# Patient Record
Sex: Female | Born: 1935
Health system: Southern US, Community
[De-identification: ages and names within clinical notes are randomized; demographics above are authoritative.]

## PROBLEM LIST (undated history)

## (undated) DIAGNOSIS — E785 Hyperlipidemia, unspecified: Secondary | ICD-10-CM

## (undated) DIAGNOSIS — K5790 Diverticulosis of intestine, part unspecified, without perforation or abscess without bleeding: Secondary | ICD-10-CM

## (undated) DIAGNOSIS — Z5189 Encounter for other specified aftercare: Secondary | ICD-10-CM

## (undated) DIAGNOSIS — R519 Headache, unspecified: Secondary | ICD-10-CM

## (undated) DIAGNOSIS — K222 Esophageal obstruction: Secondary | ICD-10-CM

## (undated) DIAGNOSIS — K3184 Gastroparesis: Secondary | ICD-10-CM

## (undated) DIAGNOSIS — IMO0001 Reserved for inherently not codable concepts without codable children: Secondary | ICD-10-CM

## (undated) DIAGNOSIS — D509 Iron deficiency anemia, unspecified: Secondary | ICD-10-CM

## (undated) DIAGNOSIS — Z9889 Other specified postprocedural states: Secondary | ICD-10-CM

## (undated) DIAGNOSIS — I10 Essential (primary) hypertension: Secondary | ICD-10-CM

## (undated) DIAGNOSIS — D126 Benign neoplasm of colon, unspecified: Secondary | ICD-10-CM

## (undated) DIAGNOSIS — I739 Peripheral vascular disease, unspecified: Secondary | ICD-10-CM

## (undated) DIAGNOSIS — K648 Other hemorrhoids: Secondary | ICD-10-CM

## (undated) DIAGNOSIS — R112 Nausea with vomiting, unspecified: Secondary | ICD-10-CM

## (undated) DIAGNOSIS — M1990 Unspecified osteoarthritis, unspecified site: Secondary | ICD-10-CM

## (undated) DIAGNOSIS — I639 Cerebral infarction, unspecified: Secondary | ICD-10-CM

## (undated) DIAGNOSIS — K219 Gastro-esophageal reflux disease without esophagitis: Secondary | ICD-10-CM

## (undated) DIAGNOSIS — E039 Hypothyroidism, unspecified: Secondary | ICD-10-CM

## (undated) DIAGNOSIS — G459 Transient cerebral ischemic attack, unspecified: Secondary | ICD-10-CM

## (undated) HISTORY — DX: Gastroparesis: K31.84

## (undated) HISTORY — DX: Essential (primary) hypertension: I10

## (undated) HISTORY — DX: Gastro-esophageal reflux disease without esophagitis: K21.9

## (undated) HISTORY — DX: Other hemorrhoids: K64.8

## (undated) HISTORY — DX: Iron deficiency anemia, unspecified: D50.9

## (undated) HISTORY — DX: Reserved for inherently not codable concepts without codable children: IMO0001

## (undated) HISTORY — DX: Peripheral vascular disease, unspecified: I73.9

## (undated) HISTORY — PX: CERVICAL FUSION: SHX112

## (undated) HISTORY — DX: Hyperlipidemia, unspecified: E78.5

## (undated) HISTORY — PX: APPENDECTOMY: SHX54

## (undated) HISTORY — DX: Esophageal obstruction: K22.2

## (undated) HISTORY — PX: OTHER SURGICAL HISTORY: SHX169

## (undated) HISTORY — PX: ABDOMINAL HYSTERECTOMY: SHX81

## (undated) HISTORY — DX: Unspecified osteoarthritis, unspecified site: M19.90

## (undated) HISTORY — DX: Benign neoplasm of colon, unspecified: D12.6

## (undated) HISTORY — DX: Headache, unspecified: R51.9

## (undated) HISTORY — DX: Encounter for other specified aftercare: Z51.89

## (undated) HISTORY — DX: Diverticulosis of intestine, part unspecified, without perforation or abscess without bleeding: K57.90

## (undated) HISTORY — DX: Hypothyroidism, unspecified: E03.9

---

## 1997-12-06 ENCOUNTER — Emergency Department (HOSPITAL_COMMUNITY): Admission: EM | Admit: 1997-12-06 | Discharge: 1997-12-06 | Payer: Self-pay | Admitting: Emergency Medicine

## 1999-04-21 ENCOUNTER — Ambulatory Visit (HOSPITAL_COMMUNITY): Admission: RE | Admit: 1999-04-21 | Discharge: 1999-04-21 | Payer: Self-pay | Admitting: Orthopaedic Surgery

## 2000-11-23 ENCOUNTER — Other Ambulatory Visit: Admission: RE | Admit: 2000-11-23 | Discharge: 2000-11-23 | Payer: Self-pay | Admitting: Obstetrics and Gynecology

## 2001-12-29 ENCOUNTER — Other Ambulatory Visit: Admission: RE | Admit: 2001-12-29 | Discharge: 2001-12-29 | Payer: Self-pay | Admitting: Obstetrics and Gynecology

## 2003-02-09 DIAGNOSIS — D126 Benign neoplasm of colon, unspecified: Secondary | ICD-10-CM

## 2003-02-09 HISTORY — DX: Benign neoplasm of colon, unspecified: D12.6

## 2003-06-06 ENCOUNTER — Encounter (HOSPITAL_COMMUNITY): Admission: RE | Admit: 2003-06-06 | Discharge: 2003-06-07 | Payer: Self-pay | Admitting: Internal Medicine

## 2003-09-17 ENCOUNTER — Emergency Department (HOSPITAL_COMMUNITY): Admission: EM | Admit: 2003-09-17 | Discharge: 2003-09-17 | Payer: Self-pay | Admitting: Emergency Medicine

## 2003-09-20 ENCOUNTER — Encounter: Payer: Self-pay | Admitting: Gastroenterology

## 2004-05-02 ENCOUNTER — Ambulatory Visit (HOSPITAL_COMMUNITY): Admission: RE | Admit: 2004-05-02 | Discharge: 2004-05-02 | Payer: Self-pay | Admitting: Gastroenterology

## 2004-06-05 ENCOUNTER — Encounter: Payer: Self-pay | Admitting: Gastroenterology

## 2004-06-05 ENCOUNTER — Ambulatory Visit (HOSPITAL_COMMUNITY): Admission: RE | Admit: 2004-06-05 | Discharge: 2004-06-05 | Payer: Self-pay | Admitting: Gastroenterology

## 2004-06-25 ENCOUNTER — Ambulatory Visit: Payer: Self-pay | Admitting: Gastroenterology

## 2004-06-26 ENCOUNTER — Ambulatory Visit: Payer: Self-pay | Admitting: Internal Medicine

## 2004-11-08 ENCOUNTER — Ambulatory Visit: Payer: Self-pay | Admitting: Internal Medicine

## 2005-02-06 ENCOUNTER — Ambulatory Visit: Payer: Self-pay | Admitting: Internal Medicine

## 2005-05-05 ENCOUNTER — Ambulatory Visit: Payer: Self-pay | Admitting: Internal Medicine

## 2005-05-16 ENCOUNTER — Encounter: Payer: Self-pay | Admitting: Internal Medicine

## 2005-06-14 ENCOUNTER — Ambulatory Visit: Payer: Self-pay | Admitting: Internal Medicine

## 2005-09-05 ENCOUNTER — Ambulatory Visit: Payer: Self-pay | Admitting: Internal Medicine

## 2005-09-09 ENCOUNTER — Ambulatory Visit: Payer: Self-pay

## 2005-09-22 ENCOUNTER — Ambulatory Visit: Payer: Self-pay

## 2005-10-06 ENCOUNTER — Ambulatory Visit: Payer: Self-pay | Admitting: Internal Medicine

## 2005-11-03 ENCOUNTER — Ambulatory Visit: Payer: Self-pay | Admitting: Internal Medicine

## 2005-11-04 ENCOUNTER — Ambulatory Visit: Payer: Self-pay | Admitting: Internal Medicine

## 2005-12-29 ENCOUNTER — Ambulatory Visit: Payer: Self-pay | Admitting: Internal Medicine

## 2006-04-14 ENCOUNTER — Ambulatory Visit: Payer: Self-pay | Admitting: Gastroenterology

## 2006-05-08 ENCOUNTER — Ambulatory Visit: Payer: Self-pay | Admitting: Internal Medicine

## 2006-06-01 ENCOUNTER — Ambulatory Visit: Payer: Self-pay | Admitting: Gastroenterology

## 2006-06-01 ENCOUNTER — Encounter: Payer: Self-pay | Admitting: Internal Medicine

## 2006-06-01 DIAGNOSIS — K648 Other hemorrhoids: Secondary | ICD-10-CM | POA: Insufficient documentation

## 2006-06-01 DIAGNOSIS — K5730 Diverticulosis of large intestine without perforation or abscess without bleeding: Secondary | ICD-10-CM | POA: Insufficient documentation

## 2006-06-10 ENCOUNTER — Ambulatory Visit: Payer: Self-pay | Admitting: Internal Medicine

## 2006-06-10 LAB — CONVERTED CEMR LAB
ALT: 21 units/L (ref 0–40)
Alkaline Phosphatase: 79 units/L (ref 39–117)
Pap Smear: NORMAL
Total Bilirubin: 0.9 mg/dL (ref 0.3–1.2)
Total Protein: 7.2 g/dL (ref 6.0–8.3)

## 2006-09-09 ENCOUNTER — Ambulatory Visit: Payer: Self-pay | Admitting: Internal Medicine

## 2006-12-02 ENCOUNTER — Ambulatory Visit: Payer: Self-pay | Admitting: Internal Medicine

## 2006-12-02 LAB — CONVERTED CEMR LAB
AST: 29 units/L (ref 0–37)
Alkaline Phosphatase: 67 units/L (ref 39–117)
BUN: 11 mg/dL (ref 6–23)
Basophils Relative: 1.1 % — ABNORMAL HIGH (ref 0.0–1.0)
CO2: 30 meq/L (ref 19–32)
Creatinine, Ser: 0.9 mg/dL (ref 0.4–1.2)
HCT: 40.4 % (ref 36.0–46.0)
Hemoglobin: 14.1 g/dL (ref 12.0–15.0)
LDL Cholesterol: 113 mg/dL — ABNORMAL HIGH (ref 0–99)
Monocytes Absolute: 0.4 10*3/uL (ref 0.2–0.7)
Neutrophils Relative %: 40.5 % — ABNORMAL LOW (ref 43.0–77.0)
Potassium: 4.1 meq/L (ref 3.5–5.1)
RDW: 12.1 % (ref 11.5–14.6)
Sodium: 143 meq/L (ref 135–145)
Total Bilirubin: 0.8 mg/dL (ref 0.3–1.2)
Total Protein: 6.4 g/dL (ref 6.0–8.3)
VLDL: 16 mg/dL (ref 0–40)

## 2006-12-18 ENCOUNTER — Ambulatory Visit: Payer: Self-pay | Admitting: Internal Medicine

## 2007-03-12 DIAGNOSIS — I10 Essential (primary) hypertension: Secondary | ICD-10-CM

## 2007-03-12 DIAGNOSIS — E785 Hyperlipidemia, unspecified: Secondary | ICD-10-CM

## 2007-03-12 DIAGNOSIS — E039 Hypothyroidism, unspecified: Secondary | ICD-10-CM

## 2007-03-12 DIAGNOSIS — I2510 Atherosclerotic heart disease of native coronary artery without angina pectoris: Secondary | ICD-10-CM | POA: Insufficient documentation

## 2007-05-17 ENCOUNTER — Encounter: Payer: Self-pay | Admitting: Internal Medicine

## 2007-05-17 LAB — HM DEXA SCAN

## 2007-06-29 ENCOUNTER — Telehealth: Payer: Self-pay | Admitting: Internal Medicine

## 2007-06-29 ENCOUNTER — Ambulatory Visit: Payer: Self-pay | Admitting: Internal Medicine

## 2007-06-29 DIAGNOSIS — L03119 Cellulitis of unspecified part of limb: Secondary | ICD-10-CM

## 2007-06-29 DIAGNOSIS — L02519 Cutaneous abscess of unspecified hand: Secondary | ICD-10-CM

## 2007-08-02 ENCOUNTER — Ambulatory Visit: Payer: Self-pay | Admitting: Internal Medicine

## 2007-08-02 LAB — CONVERTED CEMR LAB
ALT: 24 units/L (ref 0–35)
Bilirubin, Direct: 0.3 mg/dL (ref 0.0–0.3)
Cholesterol, target level: 200 mg/dL
HDL: 41.1 mg/dL (ref >39.0)
LDL Cholesterol: 95 mg/dL (ref 0–99)
TSH: 0.55 microintl units/mL (ref 0.35–5.50)
Total CHOL/HDL Ratio: 3.7
VLDL: 18 mg/dL (ref 0–40)

## 2007-08-17 ENCOUNTER — Telehealth: Payer: Self-pay | Admitting: Internal Medicine

## 2007-09-28 ENCOUNTER — Telehealth (INDEPENDENT_AMBULATORY_CARE_PROVIDER_SITE_OTHER): Payer: Self-pay | Admitting: *Deleted

## 2007-12-23 DIAGNOSIS — K219 Gastro-esophageal reflux disease without esophagitis: Secondary | ICD-10-CM

## 2007-12-23 DIAGNOSIS — K3184 Gastroparesis: Secondary | ICD-10-CM | POA: Insufficient documentation

## 2007-12-23 DIAGNOSIS — D126 Benign neoplasm of colon, unspecified: Secondary | ICD-10-CM

## 2007-12-24 ENCOUNTER — Ambulatory Visit: Payer: Self-pay | Admitting: Gastroenterology

## 2007-12-27 ENCOUNTER — Encounter: Payer: Self-pay | Admitting: Gastroenterology

## 2007-12-31 ENCOUNTER — Telehealth: Payer: Self-pay | Admitting: Gastroenterology

## 2008-02-02 ENCOUNTER — Ambulatory Visit: Payer: Self-pay | Admitting: Internal Medicine

## 2008-02-02 DIAGNOSIS — R55 Syncope and collapse: Secondary | ICD-10-CM

## 2008-02-02 DIAGNOSIS — R002 Palpitations: Secondary | ICD-10-CM | POA: Insufficient documentation

## 2008-02-02 DIAGNOSIS — T887XXA Unspecified adverse effect of drug or medicament, initial encounter: Secondary | ICD-10-CM | POA: Insufficient documentation

## 2008-02-02 LAB — CONVERTED CEMR LAB
Albumin: 4.3 g/dL (ref 3.5–5.2)
Alkaline Phosphatase: 66 units/L (ref 39–117)
Bilirubin, Direct: 0.1 mg/dL (ref 0.0–0.3)
CK-MB: 1.4 ng/mL (ref 0.3–4.0)
Calcium: 9.4 mg/dL (ref 8.4–10.5)
Cholesterol: 195 mg/dL (ref 0–200)
Eosinophils Absolute: 0.1 10*3/uL (ref 0.0–0.7)
Free T4: 1.3 ng/dL (ref 0.6–1.6)
GFR calc Af Amer: 79 mL/min
GFR calc non Af Amer: 66 mL/min
HCT: 40.1 % (ref 36.0–46.0)
HDL: 58.8 mg/dL (ref >39.0)
Hemoglobin: 14 g/dL (ref 12.0–15.0)
MCHC: 34.9 g/dL (ref 30.0–36.0)
MCV: 91.8 fL (ref 78.0–100.0)
Monocytes Absolute: 0.4 10*3/uL (ref 0.1–1.0)
Neutro Abs: 2.4 10*3/uL (ref 1.4–7.7)
Platelets: 222 10*3/uL (ref 150–400)
Potassium: 3.8 meq/L (ref 3.5–5.1)
RDW: 12.6 % (ref 11.5–14.6)
Relative Index: 1.3 (ref 0.0–2.5)
Sodium: 138 meq/L (ref 135–145)
TSH: 0.49 microintl units/mL (ref 0.35–5.50)
Total CHOL/HDL Ratio: 3.3
Total Protein: 7.7 g/dL (ref 6.0–8.3)
Triglycerides: 113 mg/dL (ref 0–149)

## 2008-02-03 ENCOUNTER — Encounter: Payer: Self-pay | Admitting: Internal Medicine

## 2008-02-04 ENCOUNTER — Ambulatory Visit: Payer: Self-pay

## 2008-02-18 ENCOUNTER — Ambulatory Visit: Payer: Self-pay | Admitting: Cardiovascular Disease

## 2008-03-03 ENCOUNTER — Encounter: Payer: Self-pay | Admitting: Gastroenterology

## 2008-03-08 ENCOUNTER — Encounter: Payer: Self-pay | Admitting: Internal Medicine

## 2008-03-10 ENCOUNTER — Ambulatory Visit: Payer: Self-pay

## 2008-03-10 ENCOUNTER — Encounter: Payer: Self-pay | Admitting: Cardiovascular Disease

## 2008-03-17 ENCOUNTER — Ambulatory Visit: Payer: Self-pay | Admitting: Internal Medicine

## 2008-03-17 DIAGNOSIS — R0600 Dyspnea, unspecified: Secondary | ICD-10-CM | POA: Insufficient documentation

## 2008-04-11 ENCOUNTER — Encounter: Payer: Self-pay | Admitting: Internal Medicine

## 2008-04-11 ENCOUNTER — Ambulatory Visit: Payer: Self-pay | Admitting: Internal Medicine

## 2008-05-22 ENCOUNTER — Ambulatory Visit: Payer: Self-pay | Admitting: Internal Medicine

## 2008-07-03 ENCOUNTER — Telehealth: Payer: Self-pay | Admitting: Internal Medicine

## 2008-07-24 ENCOUNTER — Telehealth: Payer: Self-pay | Admitting: Internal Medicine

## 2008-07-24 ENCOUNTER — Ambulatory Visit (HOSPITAL_COMMUNITY): Admission: RE | Admit: 2008-07-24 | Discharge: 2008-07-24 | Payer: Self-pay | Admitting: Internal Medicine

## 2008-07-24 DIAGNOSIS — R05 Cough: Secondary | ICD-10-CM

## 2008-07-31 ENCOUNTER — Ambulatory Visit: Payer: Self-pay | Admitting: Internal Medicine

## 2008-07-31 LAB — CONVERTED CEMR LAB
ALT: 14 units/L (ref 0–35)
AST: 27 units/L (ref 0–37)
Albumin: 3.7 g/dL (ref 3.5–5.2)
BUN: 8 mg/dL (ref 6–23)
Basophils Relative: 1 % (ref 0.0–3.0)
CO2: 31 meq/L (ref 19–32)
Calcium: 9.5 mg/dL (ref 8.4–10.5)
Chloride: 99 meq/L (ref 96–112)
Cholesterol: 169 mg/dL (ref 0–200)
Creatinine, Ser: 0.8 mg/dL (ref 0.4–1.2)
Eosinophils Relative: 1.4 % (ref 0.0–5.0)
Glucose, Bld: 85 mg/dL (ref 70–99)
Hemoglobin: 14.4 g/dL (ref 12.0–15.0)
LDL Cholesterol: 99 mg/dL (ref 0–99)
Lymphocytes Relative: 33.5 % (ref 12.0–46.0)
MCHC: 34.4 g/dL (ref 30.0–36.0)
Neutro Abs: 3.7 10*3/uL (ref 1.4–7.7)
Neutrophils Relative %: 58.1 % (ref 43.0–77.0)
RBC: 4.5 M/uL (ref 3.87–5.11)
TSH: 0.56 microintl units/mL (ref 0.35–5.50)
Total Protein: 7.5 g/dL (ref 6.0–8.3)
VLDL: 30 mg/dL (ref 0–40)
WBC: 6.4 10*3/uL (ref 4.5–10.5)

## 2008-08-16 ENCOUNTER — Telehealth: Payer: Self-pay | Admitting: Internal Medicine

## 2008-12-25 ENCOUNTER — Ambulatory Visit: Payer: Self-pay | Admitting: Gastroenterology

## 2008-12-25 DIAGNOSIS — Z860100 Personal history of colon polyps, unspecified: Secondary | ICD-10-CM | POA: Insufficient documentation

## 2009-01-13 ENCOUNTER — Encounter: Admission: RE | Admit: 2009-01-13 | Discharge: 2009-01-13 | Payer: Self-pay | Admitting: Orthopaedic Surgery

## 2009-02-05 ENCOUNTER — Ambulatory Visit: Payer: Self-pay | Admitting: Internal Medicine

## 2009-02-05 LAB — CONVERTED CEMR LAB
ALT: 20 units/L (ref 0–35)
BUN: 12 mg/dL (ref 6–23)
Basophils Relative: 0.4 % (ref 0.0–3.0)
Calcium: 9.1 mg/dL (ref 8.4–10.5)
Chloride: 109 meq/L (ref 96–112)
Creatinine, Ser: 0.9 mg/dL (ref 0.4–1.2)
Eosinophils Absolute: 0.1 10*3/uL (ref 0.0–0.7)
Eosinophils Relative: 2.3 % (ref 0.0–5.0)
HCT: 39.5 % (ref 36.0–46.0)
Lymphs Abs: 1.7 10*3/uL (ref 0.7–4.0)
MCHC: 33.9 g/dL (ref 30.0–36.0)
MCV: 94.3 fL (ref 78.0–100.0)
Monocytes Absolute: 0.3 10*3/uL (ref 0.1–1.0)
Platelets: 184 10*3/uL (ref 150.0–400.0)
TSH: 0.45 microintl units/mL (ref 0.35–5.50)
Total Bilirubin: 1.2 mg/dL (ref 0.3–1.2)
Total Protein: 7.4 g/dL (ref 6.0–8.3)
WBC: 5.3 10*3/uL (ref 4.5–10.5)

## 2009-07-13 ENCOUNTER — Ambulatory Visit: Payer: Self-pay | Admitting: Internal Medicine

## 2009-07-13 LAB — CONVERTED CEMR LAB
AST: 37 units/L (ref 0–37)
Albumin: 3.9 g/dL (ref 3.5–5.2)
Alkaline Phosphatase: 70 units/L (ref 39–117)
Cholesterol: 181 mg/dL (ref 0–200)
HDL: 51.6 mg/dL (ref >39.00)
Total Protein: 6.7 g/dL (ref 6.0–8.3)
Triglycerides: 91 mg/dL (ref 0.0–149.0)

## 2009-07-16 ENCOUNTER — Ambulatory Visit: Payer: Self-pay | Admitting: Internal Medicine

## 2009-08-28 ENCOUNTER — Telehealth: Payer: Self-pay | Admitting: Internal Medicine

## 2009-08-30 ENCOUNTER — Telehealth: Payer: Self-pay | Admitting: Internal Medicine

## 2009-08-31 ENCOUNTER — Telehealth: Payer: Self-pay | Admitting: Internal Medicine

## 2009-09-10 ENCOUNTER — Telehealth: Payer: Self-pay | Admitting: Internal Medicine

## 2009-09-10 ENCOUNTER — Ambulatory Visit: Payer: Self-pay | Admitting: Internal Medicine

## 2009-09-11 ENCOUNTER — Ambulatory Visit: Payer: Self-pay | Admitting: Internal Medicine

## 2009-09-11 DIAGNOSIS — J45991 Cough variant asthma: Secondary | ICD-10-CM

## 2009-09-13 ENCOUNTER — Ambulatory Visit: Payer: Self-pay | Admitting: Diagnostic Radiology

## 2009-09-13 ENCOUNTER — Emergency Department (HOSPITAL_BASED_OUTPATIENT_CLINIC_OR_DEPARTMENT_OTHER)
Admission: EM | Admit: 2009-09-13 | Discharge: 2009-09-13 | Payer: Self-pay | Source: Home / Self Care | Admitting: Emergency Medicine

## 2009-09-17 ENCOUNTER — Encounter (INDEPENDENT_AMBULATORY_CARE_PROVIDER_SITE_OTHER): Payer: Self-pay | Admitting: *Deleted

## 2009-09-17 ENCOUNTER — Ambulatory Visit: Payer: Self-pay | Admitting: Gastroenterology

## 2009-09-17 DIAGNOSIS — R1319 Other dysphagia: Secondary | ICD-10-CM | POA: Insufficient documentation

## 2009-09-17 DIAGNOSIS — Z800 Family history of malignant neoplasm of digestive organs: Secondary | ICD-10-CM | POA: Insufficient documentation

## 2009-09-18 ENCOUNTER — Encounter: Payer: Self-pay | Admitting: Gastroenterology

## 2009-09-20 ENCOUNTER — Telehealth: Payer: Self-pay | Admitting: Gastroenterology

## 2009-09-24 ENCOUNTER — Ambulatory Visit: Payer: Self-pay | Admitting: Gastroenterology

## 2009-09-26 ENCOUNTER — Encounter: Payer: Self-pay | Admitting: Gastroenterology

## 2009-10-12 ENCOUNTER — Ambulatory Visit: Payer: Self-pay | Admitting: Internal Medicine

## 2009-10-12 LAB — CONVERTED CEMR LAB
ALT: 23 units/L (ref 0–35)
AST: 29 units/L (ref 0–37)
Alkaline Phosphatase: 73 units/L (ref 39–117)
Bilirubin, Direct: 0.1 mg/dL (ref 0.0–0.3)
Total Bilirubin: 0.8 mg/dL (ref 0.3–1.2)

## 2009-10-19 ENCOUNTER — Ambulatory Visit: Payer: Self-pay | Admitting: Internal Medicine

## 2009-11-26 ENCOUNTER — Ambulatory Visit: Payer: Self-pay | Admitting: Gastroenterology

## 2009-11-26 DIAGNOSIS — K222 Esophageal obstruction: Secondary | ICD-10-CM | POA: Insufficient documentation

## 2009-12-12 ENCOUNTER — Telehealth: Payer: Self-pay | Admitting: Internal Medicine

## 2009-12-12 ENCOUNTER — Ambulatory Visit: Payer: Self-pay | Admitting: Internal Medicine

## 2009-12-12 DIAGNOSIS — R51 Headache: Secondary | ICD-10-CM | POA: Insufficient documentation

## 2009-12-12 DIAGNOSIS — R519 Headache, unspecified: Secondary | ICD-10-CM | POA: Insufficient documentation

## 2010-01-29 ENCOUNTER — Telehealth: Payer: Self-pay | Admitting: Internal Medicine

## 2010-02-18 ENCOUNTER — Ambulatory Visit: Payer: Self-pay | Admitting: Internal Medicine

## 2010-02-18 LAB — CONVERTED CEMR LAB
Albumin: 4.5 g/dL (ref 3.5–5.2)
Alkaline Phosphatase: 83 units/L (ref 39–117)
BUN: 12 mg/dL (ref 6–23)
Bilirubin, Direct: 0.2 mg/dL (ref 0.0–0.3)
CO2: 33 meq/L — ABNORMAL HIGH (ref 19–32)
Chloride: 101 meq/L (ref 96–112)
Creatinine, Ser: 0.8 mg/dL (ref 0.4–1.2)
Direct LDL: 114.3 mg/dL
Total Bilirubin: 1.2 mg/dL (ref 0.3–1.2)
Total CHOL/HDL Ratio: 4
VLDL: 39.2 mg/dL (ref 0.0–40.0)

## 2010-08-14 ENCOUNTER — Other Ambulatory Visit: Payer: Self-pay | Admitting: Internal Medicine

## 2010-08-14 ENCOUNTER — Ambulatory Visit
Admission: RE | Admit: 2010-08-14 | Discharge: 2010-08-14 | Payer: Self-pay | Source: Home / Self Care | Attending: Internal Medicine | Admitting: Internal Medicine

## 2010-08-14 LAB — BASIC METABOLIC PANEL
BUN: 12 mg/dL (ref 6–23)
CO2: 31 mEq/L (ref 19–32)
Calcium: 9.6 mg/dL (ref 8.4–10.5)
Chloride: 103 mEq/L (ref 96–112)
Creatinine, Ser: 0.8 mg/dL (ref 0.4–1.2)
GFR: 70.36 mL/min (ref >60.00)
Glucose, Bld: 88 mg/dL (ref 70–99)
Potassium: 4.6 mEq/L (ref 3.5–5.1)
Sodium: 140 mEq/L (ref 135–145)

## 2010-08-14 LAB — HEPATIC FUNCTION PANEL
ALT: 18 U/L (ref 0–35)
AST: 28 U/L (ref 0–37)
Albumin: 3.8 g/dL (ref 3.5–5.2)
Alkaline Phosphatase: 80 U/L (ref 39–117)
Bilirubin, Direct: 0.1 mg/dL (ref 0.0–0.3)
Total Bilirubin: 1.1 mg/dL (ref 0.3–1.2)
Total Protein: 7 g/dL (ref 6.0–8.3)

## 2010-08-14 LAB — LIPID PANEL
Cholesterol: 205 mg/dL — ABNORMAL HIGH (ref 0–200)
HDL: 49.4 mg/dL (ref >39.00)
Total CHOL/HDL Ratio: 4
Triglycerides: 157 mg/dL — ABNORMAL HIGH (ref 0.0–149.0)
VLDL: 31.4 mg/dL (ref 0.0–40.0)

## 2010-08-14 LAB — LDL CHOLESTEROL, DIRECT: Direct LDL: 129.6 mg/dL

## 2010-08-14 LAB — TSH: TSH: 1.5 u[IU]/mL (ref 0.35–5.50)

## 2010-08-21 ENCOUNTER — Ambulatory Visit
Admission: RE | Admit: 2010-08-21 | Discharge: 2010-08-21 | Payer: Self-pay | Source: Home / Self Care | Attending: Internal Medicine | Admitting: Internal Medicine

## 2010-08-23 ENCOUNTER — Encounter: Payer: Self-pay | Admitting: Internal Medicine

## 2010-08-28 ENCOUNTER — Telehealth: Payer: Self-pay | Admitting: Internal Medicine

## 2010-09-01 ENCOUNTER — Encounter: Payer: Self-pay | Admitting: Internal Medicine

## 2010-09-12 NOTE — Medication Information (Signed)
Summary: Approved/medco  Approved/medco   Imported By: Lester Olla 09/24/2009 10:08:43  _____________________________________________________________________  External Attachment:    Type:   Image     Comment:   External Document

## 2010-09-12 NOTE — Assessment & Plan Note (Signed)
Summary: uri/bmw   Vital Signs:  Patient profile:   77 year old female Height:      62 inches Weight:      144 pounds BMI:     26.43 Temp:     98.3 degrees F oral Pulse rate:   72 / minute Resp:     14 per minute BP sitting:   124 / 76  (left arm)  Vitals Entered By: Willy Eddy, LPN (September 11, 2009 12:08 PM) CC: feels better ,but still coughing and looses her breath she coughs so hard with chest soreness   Primary Care Provider:  Darryll Capers MD  CC:  feels better  and but still coughing and looses her breath she coughs so hard with chest soreness.  History of Present Illness: The cxr did not show an infiltrate but there was hyperinflations seen consistant with COPD acute exacerbation Cough is severe and results in cough induced bronchospasms has several episodes of difficulty catching breath took  omncef for 7 days and the color resolved the cough has worsened and the wheezing is worse increased gerd symptoms as well  Preventive Screening-Counseling & Management  Alcohol-Tobacco     Smoking Status: never     Passive Smoke Exposure: no  Problems Prior to Update: 1)  Personal Hx Colonic Polyps  (ICD-V12.72) 2)  Pneumonia, Organism Unspecified  (ICD-486) 3)  Cough  (ICD-786.2) 4)  Chronic Obstructive Pulmonary Disease, Mild  (ICD-496) 5)  Dyspnea On Exertion  (ICD-786.09) 6)  Uns Advrs Eff Uns Rx Medicinal&biological Sbstnc  (ICD-995.20) 7)  Palpitations, Recurrent  (ICD-785.1) 8)  Syncope  (ICD-780.2) 9)  Internal Hemorrhoids  (ICD-455.0) 10)  Diverticulosis, Colon  (ICD-562.10) 11)  Gastroparesis  (ICD-536.3) 12)  Gerd  (ICD-530.81) 13)  Adenomatous Colonic Polyp  (ICD-211.3) 14)  Cellulitis&abscess of Hand Except Fingers&thumb  (ICD-682.4) 15)  Hyperlipidemia  (ICD-272.4) 16)  Peripheral Vascular Disease  (ICD-443.9) 17)  Hypothyroidism  (ICD-244.9) 18)  Hypertension  (ICD-401.9) 19)  Coronary Artery Disease  (ICD-414.00)  Medications Prior to  Update: 1)  Ranitidine Hcl 150 Mg Caps (Ranitidine Hcl) .... One By Mouth Bid 2)  Plavix 75 Mg Tabs (Clopidogrel Bisulfate) .Marland Kitchen.. 1 Once Daily 3)  Adult Aspirin Ec Low Strength 81 Mg  Tbec (Aspirin) .... Once Daily 4)  Lipitor 80 Mg  Tabs (Atorvastatin Calcium) .... 1/2 Tab Once Daily 5)  Diovan Hct 320-25 Mg  Tabs (Valsartan-Hydrochlorothiazide) .Marland Kitchen.. 1 Once Daily 6)  Co Q-10 120 Mg  Caps (Coenzyme Q10) .... Once Daily 7)  Synthroid 75 Mcg  Tabs (Levothyroxine Sodium) .... Once Daily 8)  Ultram 50 Mg  Tabs (Tramadol Hcl) .Marland Kitchen.. 1 By Mouth Q 4-6 Prn 9)  Vitamin D 1000 Unit  Tabs (Cholecalciferol) .Marland Kitchen.. 1 Once Daily 10)  Mag-G 500 Mg  Tabs (Magnesium Gluconate) .... Two Times A Day 11)  Valium 5 Mg  Tabs (Diazepam) .... 1/4 Pill As Needed 12)  Metoprolol Tartrate 50 Mg Tabs (Metoprolol Tartrate) .... Take 1 Tablet By Mouth Once A Day 13)  Hydromet 5-1.5 Mg/55ml Syrp (Hydrocodone-Homatropine) .... One Teaspoon Q 6-8 Hrs As Needed Cough. 14)  Bromfed Dm 30-2-10 Mg/32ml Syrp (Pseudoeph-Bromphen-Dm) .... 2 Teaspoons Every 8 Hours 15)  Cefdinir 300 Mg Caps (Cefdinir) .... Bid For 7 Days.  Current Medications (verified): 1)  Ranitidine Hcl 150 Mg Caps (Ranitidine Hcl) .... One By Mouth Bid 2)  Plavix 75 Mg Tabs (Clopidogrel Bisulfate) .Marland Kitchen.. 1 Once Daily 3)  Adult Aspirin Ec Low Strength 81 Mg  Tbec (Aspirin) .... Once Daily 4)  Lipitor 80 Mg  Tabs (Atorvastatin Calcium) .... 1/2 Tab Once Daily 5)  Diovan Hct 320-25 Mg  Tabs (Valsartan-Hydrochlorothiazide) .Marland Kitchen.. 1 Once Daily 6)  Co Q-10 120 Mg  Caps (Coenzyme Q10) .... Once Daily 7)  Synthroid 75 Mcg  Tabs (Levothyroxine Sodium) .... Once Daily 8)  Ultram 50 Mg  Tabs (Tramadol Hcl) .Marland Kitchen.. 1 By Mouth Q 4-6 Prn 9)  Vitamin D 1000 Unit  Tabs (Cholecalciferol) .Marland Kitchen.. 1 Once Daily 10)  Mag-G 500 Mg  Tabs (Magnesium Gluconate) .... Two Times A Day 11)  Valium 5 Mg  Tabs (Diazepam) .... 1/4 Pill As Needed 12)  Metoprolol Tartrate 50 Mg Tabs (Metoprolol Tartrate)  .... Take 1 Tablet By Mouth Once A Day 13)  Tussicaps 10-8 Mg Xr12h-Cap (Hydrocod Polst-Chlorphen Polst) .... One By Mouth  Two Times A Day For 7 Days 14)  Benzonatate 100 Mg Caps (Benzonatate) .... One Cap By Mouth Q 6 Hrs As Needed  Allergies (verified): 1)  ! Vioxx  Past History:  Family History: Last updated: 12/24/2007 Family History of Colon Cancer:Brother at age 49  Sister at 36.  Social History: Last updated: 12/25/2008 Retired Married, 2 boys Never Smoked Alcohol use-no Drug use-no Regular exercise-yes Daily Caffeine Use 1 cup of 1/2 caf coffee in AM  Risk Factors: Exercise: yes (06/29/2007)  Risk Factors: Smoking Status: never (09/11/2009) Passive Smoke Exposure: no (09/11/2009)  Past medical, surgical, family and social histories (including risk factors) reviewed, and no changes noted (except as noted below).  Past Medical History: Reviewed history from 12/25/2008 and no changes required. INTERNAL HEMORRHOIDS (ICD-455.0) DIVERTICULOSIS, COLON (ICD-562.10) GASTROPARESIS (ICD-536.3) GERD (ICD-530.81) ADENOMATOUS COLONIC POLYP (ICD-211.3) CELLULITIS&ABSCESS OF HAND EXCEPT FINGERS&THUMB (ICD-682.4) HYPERLIPIDEMIA (ICD-272.4) PERIPHERAL VASCULAR DISEASE (ICD-443.9) HYPOTHYROIDISM (ICD-244.9) HYPERTENSION (ICD-401.9) CORONARY ARTERY DISEASE (ICD-414.00) Fe Deficiency anemia  Past Surgical History: Reviewed history from 12/24/2007 and no changes required. Appendectomy Hysterectomy  Family History: Reviewed history from 12/24/2007 and no changes required. Family History of Colon Cancer:Brother at age 28  Sister at 43.  Social History: Reviewed history from 12/25/2008 and no changes required. Retired Married, 2 boys Never Smoked Alcohol use-no Drug use-no Regular exercise-yes Daily Caffeine Use 1 cup of 1/2 caf coffee in AM  Review of Systems       The patient complains of dyspnea on exertion, prolonged cough, and severe indigestion/heartburn.   The patient denies anorexia, fever, weight loss, weight gain, vision loss, decreased hearing, hoarseness, chest pain, syncope, peripheral edema, headaches, hemoptysis, abdominal pain, melena, hematochezia, hematuria, incontinence, genital sores, muscle weakness, suspicious skin lesions, transient blindness, difficulty walking, depression, unusual weight change, abnormal bleeding, enlarged lymph nodes, angioedema, and breast masses.    Physical Exam  General:  alert and pale.   Head:  normocephalic and atraumatic.   Eyes:  pupils equal and pupils round.   Ears:  R ear normal and L ear normal.   Nose:  no nasal discharge.   Neck:  Supple; no masses or thyromegaly. Lungs:  normal respiratory effort and no intercostal retractions.   Heart:  normal rate.   Abdomen:  Soft, nontender and nondistended. No masses, hepatosplenomegaly or hernias noted. Normal bowel sounds. Msk:  normal ROM and no joint tenderness.   Pulses:  R and L carotid,radial,femoral,dorsalis pedis and posterior tibial pulses are full and equal bilaterally Extremities:  trace left pedal edema and trace right pedal edema.   Neurologic:  alert & oriented X3 and finger-to-nose normal.     Impression &  Recommendations:  Problem # 1:  CHRONIC OBSTRUCTIVE PULMONARY DISEASE, MILD (ICD-496) Assessment Deteriorated with acute exacerbation will give depo, tussicaps and tessilon perles pt  has no current symptoms conssitant with bacterial infection  Problem # 2:  COUGH VARIANT ASTHMA (ICD-493.82) Assessment: Deteriorated bronchospasm from persistant cough  Problem # 3:  GERD (ICD-530.81)  certainly the gerd may complicate the cough as well Her updated medication list for this problem includes:    Ranitidine Hcl 150 Mg Caps (Ranitidine hcl) ..... One by mouth bid  Labs Reviewed: Hgb: 13.4 (02/05/2009)   Hct: 39.5 (02/05/2009) give sample sof aciphex to last until her gi visit  Complete Medication List: 1)  Ranitidine Hcl 150  Mg Caps (Ranitidine hcl) .... One by mouth bid 2)  Plavix 75 Mg Tabs (Clopidogrel bisulfate) .Marland Kitchen.. 1 once daily 3)  Adult Aspirin Ec Low Strength 81 Mg Tbec (Aspirin) .... Once daily 4)  Lipitor 80 Mg Tabs (Atorvastatin calcium) .... 1/2 tab once daily 5)  Diovan Hct 320-25 Mg Tabs (Valsartan-hydrochlorothiazide) .Marland Kitchen.. 1 once daily 6)  Co Q-10 120 Mg Caps (Coenzyme q10) .... Once daily 7)  Synthroid 75 Mcg Tabs (Levothyroxine sodium) .... Once daily 8)  Ultram 50 Mg Tabs (Tramadol hcl) .Marland Kitchen.. 1 by mouth q 4-6 prn 9)  Vitamin D 1000 Unit Tabs (Cholecalciferol) .Marland Kitchen.. 1 once daily 10)  Mag-g 500 Mg Tabs (Magnesium gluconate) .... Two times a day 11)  Valium 5 Mg Tabs (Diazepam) .... 1/4 pill as needed 12)  Metoprolol Tartrate 50 Mg Tabs (Metoprolol tartrate) .... Take 1 tablet by mouth once a day 13)  Tussicaps 10-8 Mg Xr12h-cap (Hydrocod polst-chlorphen polst) .... One by mouth  two times a day for 7 days 14)  Benzonatate 100 Mg Caps (Benzonatate) .... One cap by mouth q 6 hrs as needed  Patient Instructions: 1)  take all medications Prescriptions: BENZONATATE 100 MG CAPS (BENZONATATE) one cap by mouth q 6 hrs as needed  #30 x 1   Entered and Authorized by:   Stacie Glaze MD   Signed by:   Stacie Glaze MD on 09/11/2009   Method used:   [facility] to        [facility] [phone][facility] (retail)       [address]       [place], [place]  [address]       Ph: [phone]       Fax: [phone]   RxID:   [id number] TUSSICAPS 10-8 MG XR12H-CAP (HYDROCOD POLST-CHLORPHEN POLST) one by mouth  two times a day for 7 days  #14 x 0   Entered and Authorized by:   Stacie Glaze MD   Signed by:   Stacie Glaze MD on 09/11/2009   Method used:   Print then Give to Patient   RxID:   [id number]   Appended Document: Orders Update     Clinical Lists Changes  Orders: Added new Service order of Depo- Medrol 80mg  ([id number]) - Signed Added new Service order of  Admin of Therapeutic Inj  intramuscular or subcutaneous ([id number]) - Signed       Medication Administration  Injection # 1:    Medication: Depo- Medrol 80mg     Diagnosis: COUGH VARIANT ASTHMA (ICD-493.82)    Route: IM    Site: LUOQ gluteus    Exp Date: 06/08/2010    Lot #: [id number]    Mfr: teva    Comments: 1.55ml/120mg  given  Patient tolerated injection without complications    Given by: Willy Eddy, LPN (September 11, 2009 4:25 PM)  Orders Added: 1)  Depo- Medrol 80mg  [J1040] 2)  Admin of Therapeutic Inj  intramuscular or subcutaneous [[id number]]

## 2010-09-12 NOTE — Progress Notes (Signed)
Summary: Triage   Phone Note Call from Patient Call back at Home Phone [phone]   Caller: Patient Call For: Dr. Russella Dar Reason for Call: Talk to Nurse Summary of Call: Pt has a colonoscopy scheduled for next week. Was told to stop her Plavix wants to know if she should sop her aspirin too. Initial call taken by: Karna Christmas,  September 20, 2009 10:05 AM  Follow-up for Phone Call        Pt told she could stay on Aspirin.  Follow-up by: Christie Nottingham CMA Duncan Dull),  September 20, 2009 10:41 AM

## 2010-09-12 NOTE — Assessment & Plan Note (Signed)
Summary: 3 month rov/njr   Vital Signs:  Patient profile:   77 year old female Height:      62 inches Weight:      143 pounds BMI:     26.25 Temp:     98.2 degrees F oral Pulse rate:   68 / minute Resp:     14 per minute BP sitting:   122 / 74  (left arm)  Vitals Entered By: Willy Eddy, LPN (October 19, 2009 9:40 AM) CC: roa labs, Hypertension Management, Lipid Management, Abdominal Pain   Primary Care Provider:  Darryll Capers MD  CC:  roa labs, Hypertension Management, Lipid Management, and Abdominal Pain.  History of Present Illness: The pt has seen dr Buzzy Han and the choleterol os a goal! HTN is at goal!  Dyspepsia History:      There is a prior history of GERD.  A prior EGD has been done.    Hypertension History:      She denies headache, chest pain, palpitations, dyspnea with exertion, orthopnea, PND, peripheral edema, visual symptoms, neurologic problems, syncope, and side effects from treatment.  no chest pain.        Positive major cardiovascular risk factors include female age 25 years old or older, hyperlipidemia, and hypertension.  Negative major cardiovascular risk factors include negative family history for ischemic heart disease and non-tobacco-user status.        Positive history for target organ damage include ASHD (either angina/prior MI/prior CABG) and peripheral vascular disease.  Further assessment for target organ damage reveals no history of stroke/TIA.    Lipid Management History:      Positive NCEP/ATP III risk factors include female age 60 years old or older, hypertension, ASHD (either angina/prior MI/prior CABG), and peripheral vascular disease.  Negative NCEP/ATP III risk factors include HDL cholesterol greater than 60, no family history for ischemic heart disease, non-tobacco-user status, no prior stroke/TIA, and no history of aortic aneurysm.       Preventive Screening-Counseling & Management  Alcohol-Tobacco     Smoking Status: never  Passive Smoke Exposure: no  Problems Prior to Update: 1)  Dysphagia  (ICD-787.29) 2)  Family Hx Colon Cancer  (ICD-V16.0) 3)  Personal Hx Colon Cancer  (ICD-V10.05) 4)  Cough Variant Asthma  (ICD-493.82) 5)  Personal Hx Colonic Polyps  (ICD-V12.72) 6)  Pneumonia, Organism Unspecified  (ICD-486) 7)  Cough  (ICD-786.2) 8)  Chronic Obstructive Pulmonary Disease, Mild  (ICD-496) 9)  Dyspnea On Exertion  (ICD-786.09) 10)  Uns Advrs Eff Uns Rx Medicinal&biological Sbstnc  (ICD-995.20) 11)  Palpitations, Recurrent  (ICD-785.1) 12)  Syncope  (ICD-780.2) 13)  Internal Hemorrhoids  (ICD-455.0) 14)  Diverticulosis, Colon  (ICD-562.10) 15)  Gastroparesis  (ICD-536.3) 16)  Gerd  (ICD-530.81) 17)  Adenomatous Colonic Polyp  (ICD-211.3) 18)  Cellulitis&abscess of Hand Except Fingers&thumb  (ICD-682.4) 19)  Hyperlipidemia  (ICD-272.4) 20)  Peripheral Vascular Disease  (ICD-443.9) 21)  Hypothyroidism  (ICD-244.9) 22)  Hypertension  (ICD-401.9) 23)  Coronary Artery Disease  (ICD-414.00)  Current Problems (verified): 1)  Dysphagia  (MWU-132.44) 2)  Family Hx Colon Cancer  (ICD-V16.0) 3)  Personal Hx Colon Cancer  (ICD-V10.05) 4)  Cough Variant Asthma  (ICD-493.82) 5)  Personal Hx Colonic Polyps  (ICD-V12.72) 6)  Pneumonia, Organism Unspecified  (ICD-486) 7)  Cough  (ICD-786.2) 8)  Chronic Obstructive Pulmonary Disease, Mild  (ICD-496) 9)  Dyspnea On Exertion  (ICD-786.09) 10)  Uns Advrs Eff Uns Rx Medicinal&biological Sbstnc  (ICD-995.20) 11)  Palpitations, Recurrent  (  ICD-785.1) 12)  Syncope  (ICD-780.2) 13)  Internal Hemorrhoids  (ICD-455.0) 14)  Diverticulosis, Colon  (ICD-562.10) 15)  Gastroparesis  (ICD-536.3) 16)  Gerd  (ICD-530.81) 17)  Adenomatous Colonic Polyp  (ICD-211.3) 18)  Cellulitis&abscess of Hand Except Fingers&thumb  (ICD-682.4) 19)  Hyperlipidemia  (ICD-272.4) 20)  Peripheral Vascular Disease  (ICD-443.9) 21)  Hypothyroidism  (ICD-244.9) 22)  Hypertension   (ICD-401.9) 23)  Coronary Artery Disease  (ICD-414.00)  Medications Prior to Update: 1)  Plavix 75 Mg Tabs (Clopidogrel Bisulfate) .Marland Kitchen.. 1 Once Daily 2)  Adult Aspirin Ec Low Strength 81 Mg  Tbec (Aspirin) .... Once Daily 3)  Lipitor 80 Mg  Tabs (Atorvastatin Calcium) .... 1/2 Tab Once Daily 4)  Diovan Hct 320-25 Mg  Tabs (Valsartan-Hydrochlorothiazide) .Marland Kitchen.. 1 Once Daily 5)  Co Q-10 120 Mg  Caps (Coenzyme Q10) .... Once Daily 6)  Synthroid 75 Mcg  Tabs (Levothyroxine Sodium) .... Once Daily 7)  Ultram 50 Mg  Tabs (Tramadol Hcl) .Marland Kitchen.. 1 By Mouth Q 4-6 Prn 8)  Vitamin D 1000 Unit  Tabs (Cholecalciferol) .Marland Kitchen.. 1 Once Daily 9)  Mag-G 500 Mg  Tabs (Magnesium Gluconate) .... Two Times A Day 10)  Valium 5 Mg  Tabs (Diazepam) .... 1/4 Pill As Needed 11)  Metoprolol Tartrate 50 Mg Tabs (Metoprolol Tartrate) .... Take 1 Tablet By Mouth Once A Day 12)  Aciphex 20 Mg Tbec (Rabeprazole Sodium) .... One Tablet By Mouth Once Daily 13)  Aciphex 20 Mg  Tbec (Rabeprazole Sodium) .... Take 1 Twice A Day 30 Minutes Before Meals  Current Medications (verified): 1)  Plavix 75 Mg Tabs (Clopidogrel Bisulfate) .Marland Kitchen.. 1 Once Daily 2)  Adult Aspirin Ec Low Strength 81 Mg  Tbec (Aspirin) .... Once Daily 3)  Lipitor 40 Mg Tabs (Atorvastatin Calcium) .Marland Kitchen.. 1 Once Daily 4)  Diovan Hct 320-25 Mg  Tabs (Valsartan-Hydrochlorothiazide) .Marland Kitchen.. 1 Once Daily 5)  Co Q-10 120 Mg  Caps (Coenzyme Q10) .... Once Daily 6)  Synthroid 75 Mcg  Tabs (Levothyroxine Sodium) .... Once Daily 7)  Ultram 50 Mg  Tabs (Tramadol Hcl) .Marland Kitchen.. 1 By Mouth Q 4-6 Prn 8)  Vitamin D 1000 Unit  Tabs (Cholecalciferol) .Marland Kitchen.. 1 Once Daily 9)  Mag-G 500 Mg  Tabs (Magnesium Gluconate) .... Two Times A Day 10)  Valium 5 Mg  Tabs (Diazepam) .... 1/4 Pill As Needed 11)  Metoprolol Tartrate 50 Mg Tabs (Metoprolol Tartrate) .... Take 1 Tablet By Mouth Once A Day 12)  Aciphex 20 Mg Tbec (Rabeprazole Sodium) .... One Tablet By Mouth Once Daily  Allergies (verified): 1)   ! Vioxx  Directives (verified): 1)  Living Will   Past History:  Family History: Last updated: 09/17/2009 Family History of Colon Cancer:Brother at age 59  Sister at 11. Family History of Diabetes: Mother side of family  Social History: Last updated: 12/25/2008 Retired Married, 2 boys Never Smoked Alcohol use-no Drug use-no Regular exercise-yes Daily Caffeine Use 1 cup of 1/2 caf coffee in AM  Risk Factors: Exercise: yes (06/29/2007)  Risk Factors: Smoking Status: never (10/19/2009) Passive Smoke Exposure: no (10/19/2009)  Past medical, surgical, family and social histories (including risk factors) reviewed, and no changes noted (except as noted below).  Past Medical History: Reviewed history from 09/17/2009 and no changes required. INTERNAL HEMORRHOIDS (ICD-455.0) DIVERTICULOSIS, COLON (ICD-562.10) GASTROPARESIS (ICD-536.3) GERD (ICD-530.81) ADENOMATOUS COLONIC POLYP (ICD-211.3) 02/2003 CELLULITIS&ABSCESS OF HAND EXCEPT FINGERS&THUMB (ICD-682.4) HYPERLIPIDEMIA (ICD-272.4) PERIPHERAL VASCULAR DISEASE (ICD-443.9) HYPOTHYROIDISM (ICD-244.9) HYPERTENSION (ICD-401.9) CORONARY ARTERY DISEASE (ICD-414.00) Fe Deficiency anemia  Past  Surgical History: Reviewed history from 09/17/2009 and no changes required. Appendectomy Hysterectomy Cervical Fusion  Family History: Reviewed history from 09/17/2009 and no changes required. Family History of Colon Cancer:Brother at age 63  Sister at 46. Family History of Diabetes: Mother side of family  Social History: Reviewed history from 12/25/2008 and no changes required. Retired Married, 2 boys Never Smoked Alcohol use-no Drug use-no Regular exercise-yes Daily Caffeine Use 1 cup of 1/2 caf coffee in AM  Review of Systems  The patient denies anorexia, fever, weight loss, weight gain, vision loss, decreased hearing, hoarseness, chest pain, syncope, dyspnea on exertion, peripheral edema, prolonged cough, headaches,  hemoptysis, abdominal pain, melena, hematochezia, severe indigestion/heartburn, hematuria, incontinence, genital sores, muscle weakness, suspicious skin lesions, transient blindness, difficulty walking, depression, unusual weight change, abnormal bleeding, enlarged lymph nodes, angioedema, and breast masses.    Physical Exam  General:  Well-developed,well-nourished,in no acute distress; alert,appropriate and cooperative throughout examination Head:  normocephalic and atraumatic.   Eyes:  pupils equal and pupils round.   Ears:  R ear normal and L ear normal.   Mouth:  No deformity or lesions, dentition normal. Neck:  Supple; no masses or thyromegaly. Lungs:  normal respiratory effort and no intercostal retractions.   Abdomen:  Soft, nontender and nondistended. No masses, hepatosplenomegaly or hernias noted. Normal bowel sounds.   Impression & Recommendations:  Problem # 1:  GERD (ICD-530.81)  The following medications were removed from the medication list:    Aciphex 20 Mg Tbec (Rabeprazole sodium) ..... One tablet by mouth once daily    Aciphex 20 Mg Tbec (Rabeprazole sodium) .Marland Kitchen... Take 1 twice a day 30 minutes before meals  EGD: DONE (09/24/2009)  Labs Reviewed: Hgb: 13.4 (02/05/2009)   Hct: 39.5 (02/05/2009)  Problem # 2:  HYPERLIPIDEMIA (ICD-272.4) Assessment: Unchanged  Her updated medication list for this problem includes:    Lipitor 40 Mg Tabs (Atorvastatin calcium) .Marland Kitchen... 1 once daily  Labs Reviewed: SGOT: 29 (10/12/2009)   SGPT: 23 (10/12/2009)  Lipid Goals: Chol Goal: 200 (08/02/2007)   HDL Goal: 40 (08/02/2007)   LDL Goal: 100 (08/02/2007)   TG Goal: 150 (08/02/2007)  Prior 10 Yr Risk Heart Disease: N/A (08/02/2007)   HDL:78.20 (10/12/2009), 51.60 (07/13/2009)  LDL:94 (10/12/2009), 111 (16/05/9603)  Chol:191 (10/12/2009), 181 (07/13/2009)  Trig:95.0 (10/12/2009), 91.0 (07/13/2009)  Orders: Prescription Created Electronically [phone])  Problem # 3:  PERIPHERAL  VASCULAR DISEASE (ICD-443.9)  stable seeing the cardiologist  Problem # 4:  GERD (ICD-530.81) the pt has been informed that there is a potential interaction between plavix and this druag and understands the risks The following medications were removed from the medication list:    Aciphex 20 Mg Tbec (Rabeprazole sodium) ..... One tablet by mouth once daily    Aciphex 20 Mg Tbec (Rabeprazole sodium) .Marland Kitchen... Take 1 twice a day 30 minutes before meals  EGD: DONE (09/24/2009)  Labs Reviewed: Hgb: 13.4 (02/05/2009)   Hct: 39.5 (02/05/2009)  Complete Medication List: 1)  Plavix 75 Mg Tabs (Clopidogrel bisulfate) .Marland Kitchen.. 1 once daily 2)  Adult Aspirin Ec Low Strength 81 Mg Tbec (Aspirin) .... Once daily 3)  Lipitor 40 Mg Tabs (Atorvastatin calcium) .Marland Kitchen.. 1 once daily 4)  Diovan Hct 320-25 Mg Tabs (Valsartan-hydrochlorothiazide) .Marland Kitchen.. 1 once daily 5)  Co Q-10 120 Mg Caps (Coenzyme q10) .... Once daily 6)  Synthroid 75 Mcg Tabs (Levothyroxine sodium) .... Once daily 7)  Ultram 50 Mg Tabs (Tramadol hcl) .Marland Kitchen.. 1 by mouth q 4-6 prn 8)  Vitamin D  1000 Unit Tabs (Cholecalciferol) .Marland Kitchen.. 1 once daily 9)  Mag-g 500 Mg Tabs (Magnesium gluconate) .... Two times a day 10)  Valium 5 Mg Tabs (Diazepam) .... 1/4 pill as needed 11)  Metoprolol Tartrate 50 Mg Tabs (Metoprolol tartrate) .... Take 1 tablet by mouth once a day  Hypertension Assessment/Plan:      The patient's hypertensive risk group is category C: Target organ damage and/or diabetes.  Today's blood pressure is 122/74.  Her blood pressure goal is < 140/90.  Lipid Assessment/Plan:      Based on NCEP/ATP III, the patient's risk factor category is "history of coronary disease, peripheral vascular disease, cerebrovascular disease, or aortic aneurysm".  The patient's lipid goals are as follows: Total cholesterol goal is 200; LDL cholesterol goal is 100; HDL cholesterol goal is 40; Triglyceride goal is 150.  Her LDL cholesterol goal has been met.    Patient  Instructions: 1)  Please schedule a follow-up appointment in 4 months.   CPX Prescriptions: LIPITOR 40 MG TABS (ATORVASTATIN CALCIUM) 1 once daily  #30 x 11   Entered by:   Willy Eddy, LPN   Authorized by:   Stacie Glaze MD   Signed by:   Willy Eddy, LPN on 82/95/6213   Method used:   [facility] to        [facility] [phone]* (retail)       [address]       [place], [place]  [address]       Ph: [phone]       Fax: [phone]   RxID:   [id number]

## 2010-09-12 NOTE — Letter (Signed)
Summary: [facility] Biopsy Results  [facility]  [address] [place], [place] [address]   Phone: [phone]  Fax: [phone]        September 26, 2009 MRN: [id number]    Kathleen Thompson [address] [place], [place]  [address]    Dear Ms. Flenner,  I am pleased to inform you that the biopsies taken during your recent endoscopic examination did not show any evidence of cancer upon pathologic examination. The biopsies showed mild inflammation.  Continue with the treatment plan as outlined on the day of your      exam.  Please call us if you are having persistent problems or have questions about your condition that have not been fully answered at this time.  Sincerely,  Meryl Dare MD [facility]  This letter has been electronically signed by your physician.  Appended Document: Patient Notice-Endo Biopsy Results Letter mailed 2.18.11

## 2010-09-12 NOTE — Procedures (Signed)
Summary: Upper Endoscopy  Patient: Kathleen Thompson Note: All result statuses are Final unless otherwise noted.  Tests: (1) Upper Endoscopy (EGD)   EGD Upper Endoscopy       DONE     [facility]     [address]     [place], [place]  [address]           ENDOSCOPY PROCEDURE REPORT           PATIENT:  Kathleen Thompson, Kathleen Thompson  MR#:  [id number]     BIRTHDATE:  [birth year], 75 yrs. old  GENDER:  female           ENDOSCOPIST:  Judie Petit T. Russella Dar, MD, [facility]           PROCEDURE DATE:  09/24/2009     PROCEDURE:  EGD with biopsy, and with dilatation over guidewire     ASA CLASS:  Class II     INDICATIONS:  GERD           MEDICATIONS:  Fentanyl 50 mcg IV, Versed 7 mg IV     TOPICAL ANESTHETIC:  Exactacain Spray           DESCRIPTION OF PROCEDURE:   After the risks benefits and     alternatives of the procedure were thoroughly explained, informed     consent was obtained.  The LB GIF-H180 [id number] endoscope was     introduced through the mouth and advanced to the second portion of     the duodenum, without limitations.  The instrument was slowly     withdrawn as the mucosa was fully examined.     <<PROCEDUREIMAGES>>           A stricture was found in the distal esophagus. It was mild and     benign appearing.Multiple biopies were obatined and submitted to     pathology. Savary / guidewire 17mm with minimal resistance and no     heme. The rest of the esophagus was completely normal in     appearance. The stomach was entered and closely examined. The     pylorus, antrum, angularis, and lesser curvature were well     visualized, including a retroflexed view of the cardia and fundus.     The stomach wall was normally distensable. Mild gastritis     diffusely with erythema and a granular appearance noted. The scope     passed easily through the pylorus into the duodenum. The duodenal     bulb was normal in appearance, as was the postbulbar duodenum.     Retroflexed views revealed no  abnormalities.  The scope was then     withdrawn from the patient and the procedure completed.           COMPLICATIONS:  None           ENDOSCOPIC IMPRESSION:     1) Stricture in the distal esophagus     2) Mild gastritis           RECOMMENDATIONS:     1) await pathology results     2) anti-reflux regimen     3) PPI bid: Aciphex 20mg  po bid, #60, 11 refills     4) Call office to schedule an office appointment for 8 weeks     5) Post dilation instructions           Louis Gaw T. Russella Dar, MD, [facility]           CC:  Stacie Glaze, MD  n.     eSIGNED:   Glena Pharris T. Josephina Melcher at 09/24/2009 11:44 AM           Tacy Dura, [id number]  Note: An exclamation mark (!) indicates a result that was not dispersed into the flowsheet. Document Creation Date: 09/24/2009 11:44 AM _______________________________________________________________________  (1) Order result status: Final Collection or observation date-time: 09/24/2009 11:34 Requested date-time:  Receipt date-time:  Reported date-time:  Referring Physician:   Ordering Physician: Claudette Head [phone]) Specimen Source:  Source: Launa Grill Order Number: [id number] Lab site:

## 2010-09-12 NOTE — Progress Notes (Signed)
Summary: coughing up green mucus  Phone Note Call from Patient   Caller: live Summary of Call: Says she is coughing up green mucus & doesn't want to get pneumonia as before.  T measured highes 100.8, goes up with coughing.  Chest congestion.  The Cefdinir 300mg  one two times a day up  you gave helped before.  [facility].  Allergic to Vioxx, not on maket anyhow.   Initial call taken by: Rudy Jew, RN,  August 31, 2009 9:38 AM  Follow-up for Phone Call        defdinir 300mg   two times a day for 7 days- per dr Lovell Sheehan Follow-up by: Willy Eddy, LPN,  August 31, 2009 11:42 AM  Additional Follow-up for Phone Call Additional follow up Details #1::        Phone Call Completed Additional Follow-up by: Rudy Jew, RN,  August 31, 2009 12:23 PM    New/Updated Medications: CEFDINIR 300 MG CAPS (CEFDINIR) Bid for 7 days. Prescriptions: CEFDINIR 300 MG CAPS (CEFDINIR) Bid for 7 days.  #14 x 0   Entered by:   Rudy Jew, RN   Authorized by:   Stacie Glaze MD   Signed by:   Rudy Jew, RN on 08/31/2009   Method used:   [facility] to        [facility] [phone][facility] (retail)       [address]       [place], [place]  [address]       Ph: [phone]       Fax: [phone]   RxID:   [id number]

## 2010-09-12 NOTE — Assessment & Plan Note (Signed)
Summary: 6 month fup//ccm   Vital Signs:  Patient profile:   77 year old female Height:      62 inches Weight:      144 pounds BMI:     26.43 Temp:     98.2 degrees F oral Pulse rate:   68 / minute Resp:     14 per minute BP sitting:   120 / 70  (left arm)  Vitals Entered By: Willy Eddy, LPN (August 21, 2010 9:44 AM) CC: roa labs, Hypertension Management, Lipid Management Is Patient Diabetic? No   Primary Care Provider:  Darryll Capers MD  CC:  roa labs, Hypertension Management, and Lipid Management.  History of Present Illness: monitering of lipids, HTN GERD aand Asthma... labs drawn prior to visit and reviewed. Questions about statin use  Hypertension History:      She denies headache, chest pain, palpitations, dyspnea with exertion, orthopnea, PND, peripheral edema, visual symptoms, neurologic problems, syncope, and side effects from treatment.        Positive major cardiovascular risk factors include female age 38 years old or older, hyperlipidemia, and hypertension.  Negative major cardiovascular risk factors include negative family history for ischemic heart disease and non-tobacco-user status.        Positive history for target organ damage include ASHD (either angina/prior MI/prior CABG) and peripheral vascular disease.  Further assessment for target organ damage reveals no history of stroke/TIA.    Lipid Management History:      Positive NCEP/ATP III risk factors include female age 62 years old or older, hypertension, ASHD (either angina/prior MI/prior CABG), and peripheral vascular disease.  Negative NCEP/ATP III risk factors include no family history for ischemic heart disease, non-tobacco-user status, no prior stroke/TIA, and no history of aortic aneurysm.      Preventive Screening-Counseling & Management  Alcohol-Tobacco     Smoking Status: never     Passive Smoke Exposure: no  Problems Prior to Update: 1)  Preventive Health Care  (ICD-V70.0) 2)   Facial Pain  (ICD-784.0) 3)  Esophageal Stricture  (ICD-530.3) 4)  Dysphagia  (ICD-787.29) 5)  Family Hx Colon Cancer  (ICD-V16.0) 6)  Personal Hx Colon Cancer  (ICD-V10.05) 7)  Cough Variant Asthma  (ICD-493.82) 8)  Personal Hx Colonic Polyps  (ICD-V12.72) 9)  Pneumonia, Organism Unspecified  (ICD-486) 10)  Cough  (ICD-786.2) 11)  Chronic Obstructive Pulmonary Disease, Mild  (ICD-496) 12)  Dyspnea On Exertion  (ICD-786.09) 13)  Uns Advrs Eff Uns Rx Medicinal&biological Sbstnc  (ICD-995.20) 14)  Palpitations, Recurrent  (ICD-785.1) 15)  Syncope  (ICD-780.2) 16)  Internal Hemorrhoids  (ICD-455.0) 17)  Diverticulosis, Colon  (ICD-562.10) 18)  Gastroparesis  (ICD-536.3) 19)  Gerd  (ICD-530.81) 20)  Adenomatous Colonic Polyp  (ICD-211.3) 21)  Cellulitis&abscess of Hand Except Fingers&thumb  (ICD-682.4) 22)  Hyperlipidemia  (ICD-272.4) 23)  Peripheral Vascular Disease  (ICD-443.9) 24)  Hypothyroidism  (ICD-244.9) 25)  Hypertension  (ICD-401.9) 26)  Coronary Artery Disease  (ICD-414.00)  Current Problems (verified): 1)  Preventive Health Care  (ICD-V70.0) 2)  Facial Pain  (ICD-784.0) 3)  Esophageal Stricture  (ICD-530.3) 4)  Dysphagia  (ICD-787.29) 5)  Family Hx Colon Cancer  (ICD-V16.0) 6)  Personal Hx Colon Cancer  (ICD-V10.05) 7)  Cough Variant Asthma  (ICD-493.82) 8)  Personal Hx Colonic Polyps  (ICD-V12.72) 9)  Pneumonia, Organism Unspecified  (ICD-486) 10)  Cough  (ICD-786.2) 11)  Chronic Obstructive Pulmonary Disease, Mild  (ICD-496) 12)  Dyspnea On Exertion  (ICD-786.09) 13)  Uns Advrs Eff  Uns Rx Medicinal&biological Sbstnc  (ICD-995.20) 14)  Palpitations, Recurrent  (ICD-785.1) 15)  Syncope  (ICD-780.2) 16)  Internal Hemorrhoids  (ICD-455.0) 17)  Diverticulosis, Colon  (ICD-562.10) 18)  Gastroparesis  (ICD-536.3) 19)  Gerd  (ICD-530.81) 20)  Adenomatous Colonic Polyp  (ICD-211.3) 21)  Cellulitis&abscess of Hand Except Fingers&thumb  (ICD-682.4) 22)  Hyperlipidemia   (ICD-272.4) 23)  Peripheral Vascular Disease  (ICD-443.9) 24)  Hypothyroidism  (ICD-244.9) 25)  Hypertension  (ICD-401.9) 26)  Coronary Artery Disease  (ICD-414.00)  Medications Prior to Update: 1)  Plavix 75 Mg Tabs (Clopidogrel Bisulfate) .Marland Kitchen.. 1 Once Daily 2)  Adult Aspirin Ec Low Strength 81 Mg  Tbec (Aspirin) .... Once Daily 3)  Lipitor 40 Mg Tabs (Atorvastatin Calcium) .Marland Kitchen.. 1 Once Daily 4)  Diovan Hct 320-25 Mg  Tabs (Valsartan-Hydrochlorothiazide) .Marland Kitchen.. 1 Once Daily 5)  Co Q-10 120 Mg  Caps (Coenzyme Q10) .... Once Daily 6)  Synthroid 75 Mcg  Tabs (Levothyroxine Sodium) .... Once Daily 7)  Ultram 50 Mg  Tabs (Tramadol Hcl) .Marland Kitchen.. 1 By Mouth Q 4-6 Prn 8)  Vitamin D 1000 Unit  Tabs (Cholecalciferol) .Marland Kitchen.. 1 Once Daily 9)  Mag-G 500 Mg  Tabs (Magnesium Gluconate) .... Two Times A Day 10)  Valium 5 Mg  Tabs (Diazepam) .... 1/4 Pill As Needed 11)  Metoprolol Tartrate 50 Mg Tabs (Metoprolol Tartrate) .... Take 1 Tablet By Mouth Once A Day 12)  Nexium 40 Mg Cpdr (Esomeprazole Magnesium) .Marland Kitchen.. 1 Capsule By Mouth Two Times A Day X 2months Then Change To Aciphex.  Current Medications (verified): 1)  Plavix 75 Mg Tabs (Clopidogrel Bisulfate) .Marland Kitchen.. 1 Once Daily 2)  Adult Aspirin Ec Low Strength 81 Mg  Tbec (Aspirin) .... Once Daily 3)  Lipitor 40 Mg Tabs (Atorvastatin Calcium) .Marland Kitchen.. 1 Once Daily 4)  Diovan Hct 320-25 Mg  Tabs (Valsartan-Hydrochlorothiazide) .Marland Kitchen.. 1 Once Daily 5)  Co Q-10 120 Mg  Caps (Coenzyme Q10) .... Once Daily 6)  Synthroid 75 Mcg  Tabs (Levothyroxine Sodium) .... Once Daily 7)  Ultram 50 Mg  Tabs (Tramadol Hcl) .Marland Kitchen.. 1 By Mouth Q 4-6 Prn 8)  Vitamin D 1000 Unit  Tabs (Cholecalciferol) .Marland Kitchen.. 1 Once Daily 9)  Mag-G 500 Mg  Tabs (Magnesium Gluconate) .... Two Times A Day 10)  Valium 5 Mg  Tabs (Diazepam) .... 1/4 Pill As Needed 11)  Metoprolol Tartrate 50 Mg Tabs (Metoprolol Tartrate) .... Take 1 Tablet By Mouth Once A Day 12)  Nexium 40 Mg Cpdr (Esomeprazole Magnesium) .Marland Kitchen.. 1  Capsule By Mouth Two Times A Day X 2months Then Change To Aciphex.  Allergies (verified): 1)  ! Vioxx  Directives: 1)  Living Will   Past History:  Family History: Last updated: 09/17/2009 Family History of Colon Cancer:Brother at age 84  Sister at 67. Family History of Diabetes: Mother side of family  Social History: Last updated: 12/25/2008 Retired Married, 2 boys Never Smoked Alcohol use-no Drug use-no Regular exercise-yes Daily Caffeine Use 1 cup of 1/2 caf coffee in AM  Risk Factors: Exercise: yes (06/29/2007)  Risk Factors: Smoking Status: never (08/21/2010) Passive Smoke Exposure: no (08/21/2010)  Past medical, surgical, family and social histories (including risk factors) reviewed, and no changes noted (except as noted below).  Past Medical History: Reviewed history from 11/26/2009 and no changes required. INTERNAL HEMORRHOIDS (ICD-455.0) DIVERTICULOSIS, COLON (ICD-562.10) GASTROPARESIS (ICD-536.3) GERD (ICD-530.81) ADENOMATOUS COLONIC POLYP (ICD-211.3) 02/2003 CELLULITIS&ABSCESS OF HAND EXCEPT FINGERS&THUMB (ICD-682.4) HYPERLIPIDEMIA (ICD-272.4) PERIPHERAL VASCULAR DISEASE (ICD-443.9) HYPOTHYROIDISM (ICD-244.9) HYPERTENSION (ICD-401.9) CORONARY ARTERY DISEASE (ICD-414.00)  Fe Deficiency anemia Esophageal stricture  Past Surgical History: Reviewed history from 09/17/2009 and no changes required. Appendectomy Hysterectomy Cervical Fusion  Family History: Reviewed history from 09/17/2009 and no changes required. Family History of Colon Cancer:Brother at age 32  Sister at 62. Family History of Diabetes: Mother side of family  Social History: Reviewed history from 12/25/2008 and no changes required. Retired Married, 2 boys Never Smoked Alcohol use-no Drug use-no Regular exercise-yes Daily Caffeine Use 1 cup of 1/2 caf coffee in AM  Review of Systems  The patient denies anorexia, fever, weight loss, weight gain, vision loss, decreased  hearing, hoarseness, chest pain, syncope, dyspnea on exertion, peripheral edema, prolonged cough, headaches, hemoptysis, abdominal pain, melena, hematochezia, severe indigestion/heartburn, hematuria, incontinence, genital sores, muscle weakness, suspicious skin lesions, transient blindness, difficulty walking, depression, unusual weight change, abnormal bleeding, enlarged lymph nodes, angioedema, and breast masses.    Physical Exam  General:  Well-developed,well-nourished,in no acute distress; alert,appropriate and cooperative throughout examination Head:  patient has some subtle left parotid enlargement compared to the right. No warmth or erythema. No TMJ tenderness. Eyes:  pupils equal, pupils round, and pupils reactive to light.   Ears:  R ear normal and L ear normal.   Neck:  supple no adenopathy and no masses palpated. Lungs:  Normal respiratory effort, chest expands symmetrically. Lungs are clear to auscultation, no crackles or wheezes. Heart:  normal rate and regular rhythm.   Abdomen:  Soft, nontender and nondistended. No masses, hepatosplenomegaly or hernias noted. Normal bowel sounds. Extremities:  trace left pedal edema and trace right pedal edema.   Neurologic:  alert & oriented X3 and finger-to-nose normal.     Impression & Recommendations:  Problem # 1:  HYPERLIPIDEMIA (ICD-272.4) Assessment Unchanged the pt has been dieting but has reduced exercize...Marland KitchenMarland Kitchen needs to walk as this efects HDL Her updated medication list for this problem includes:    Lipitor 40 Mg Tabs (Atorvastatin calcium) .Marland Kitchen... 1 once daily  Labs Reviewed: SGOT: 29 (02/18/2010)   SGPT: 19 (02/18/2010)  Lipid Goals: Chol Goal: 200 (08/02/2007)   HDL Goal: 40 (08/02/2007)   LDL Goal: 100 (08/02/2007)   TG Goal: 150 (08/02/2007)  Prior 10 Yr Risk Heart Disease: N/A (08/02/2007)   HDL:59.70 (02/18/2010), 78.20 (10/12/2009)  LDL:94 (10/12/2009), 111 (16/05/9603)  Chol:210 (02/18/2010), 191 (10/12/2009)   Trig:196.0 (02/18/2010), 95.0 (10/12/2009)  Problem # 2:  ESOPHAGEAL STRICTURE (ICD-530.3) Assessment: Deteriorated GERD with hx of stricture fallowed by GI moderate recurrent symtoms noted  Problem # 3:  HYPERTENSION (ICD-401.9) Assessment: Improved  Her updated medication list for this problem includes:    Diovan Hct 320-25 Mg Tabs (Valsartan-hydrochlorothiazide) .Marland Kitchen... 1 once daily    Metoprolol Succinate 50 Mg Xr24h-tab (Metoprolol succinate) .Marland Kitchen... 1 once daily  BP today: 120/70 Prior BP: 130/78 (02/18/2010)  Prior 10 Yr Risk Heart Disease: N/A (08/02/2007)  Labs Reviewed: K+: 4.8 (02/18/2010) Creat: : 0.8 (02/18/2010)   Chol: 210 (02/18/2010)   HDL: 59.70 (02/18/2010)   LDL: 94 (10/12/2009)   TG: 196.0 (02/18/2010)  Complete Medication List: 1)  Plavix 75 Mg Tabs (Clopidogrel bisulfate) .Marland Kitchen.. 1 once daily 2)  Adult Aspirin Ec Low Strength 81 Mg Tbec (Aspirin) .... Once daily 3)  Lipitor 40 Mg Tabs (Atorvastatin calcium) .Marland Kitchen.. 1 once daily 4)  Diovan Hct 320-25 Mg Tabs (Valsartan-hydrochlorothiazide) .Marland Kitchen.. 1 once daily 5)  Co Q-10 120 Mg Caps (Coenzyme q10) .... Once daily 6)  Synthroid 75 Mcg Tabs (Levothyroxine sodium) .... Once daily 7)  Ultram 50 Mg Tabs (  Tramadol hcl) .Marland Kitchen.. 1 by mouth q 4-6 prn 8)  Vitamin D 1000 Unit Tabs (Cholecalciferol) .Marland Kitchen.. 1 once daily 9)  Mag-g 500 Mg Tabs (Magnesium gluconate) .... Two times a day 10)  Valium 5 Mg Tabs (Diazepam) .... 1/4 pill as needed 11)  Metoprolol Succinate 50 Mg Xr24h-tab (Metoprolol succinate) .Marland Kitchen.. 1 once daily 12)  Nexium 40 Mg Cpdr (Esomeprazole magnesium) .Marland Kitchen.. 1 capsule by mouth two times a day x 2months then change to aciphex.  Hypertension Assessment/Plan:      The patient's hypertensive risk group is category C: Target organ damage and/or diabetes.  Today's blood pressure is 120/70.  Her blood pressure goal is < 140/90.  Lipid Assessment/Plan:      Based on NCEP/ATP III, the patient's risk factor category is "history  of coronary disease, peripheral vascular disease, cerebrovascular disease, or aortic aneurysm".  The patient's lipid goals are as follows: Total cholesterol goal is 200; LDL cholesterol goal is 100; HDL cholesterol goal is 40; Triglyceride goal is 150.  Her LDL cholesterol goal has been met.    Patient Instructions: 1)  Please schedule a follow-up appointment in 4 months. 2)  Hepatic Panel prior to visit, ICD-9:995.20 3)  Lipid Panel prior to visit, ICD-9:272.4 4)  Key is activity Prescriptions: METOPROLOL TARTRATE 50 MG TABS (METOPROLOL TARTRATE) Take 1 tablet by mouth once a day  #90.0 Tablet x 3   Entered by:   Willy Eddy, LPN   Authorized by:   Stacie Glaze MD   Signed by:   Willy Eddy, LPN on 16/05/9603   Method used:   Electronically to        [facility]* (retail)             ,          Ph: [phone]       Fax: [phone]   RxID:   [id number]    Orders Added: 1)  Est. Patient Level IV [32440]

## 2010-09-12 NOTE — Assessment & Plan Note (Signed)
Summary: reflux/chest pain/back pain...as.   History of Present Illness Visit Type: follow up Primary GI MD: Claudette Head MD [facility] Primary Provider: Darryll Capers MD Chief Complaint: reflux, burning in chest that radiates to back, hurts to swallow History of Present Illness:   This is a 77 year old female, who relates worsening problems with acid reflux, belching, chest pain, heartburn, and solid and liquid dysphagia for several months.  In addition, she has had nausea with occasional vomiting. Nexium was discontinued and ranitidine was started in June 2010. An endoscopy performed in 2005 showed distal esophageal erythema, felt secondary to GERD, and gastritis. She started taking AcipHex 20 mg daily 4 days ago, but has not noted an improvement in symptoms.   GI Review of Systems    Reports acid reflux and  chest pain.      Denies abdominal pain, belching, bloating, dysphagia with liquids, dysphagia with solids, heartburn, loss of appetite, nausea, vomiting, vomiting blood, weight loss, and  weight gain.        Denies anal fissure, black tarry stools, change in bowel habit, constipation, diarrhea, diverticulosis, fecal incontinence, heme positive stool, hemorrhoids, irritable bowel syndrome, jaundice, light color stool, liver problems, rectal bleeding, and  rectal pain.    Current Medications (verified): 1)  Ranitidine Hcl 150 Mg Caps (Ranitidine Hcl) .... One By Mouth Bid 2)  Plavix 75 Mg Tabs (Clopidogrel Bisulfate) .Marland Kitchen.. 1 Once Daily 3)  Adult Aspirin Ec Low Strength 81 Mg  Tbec (Aspirin) .... Once Daily 4)  Lipitor 80 Mg  Tabs (Atorvastatin Calcium) .... 1/2 Tab Once Daily 5)  Diovan Hct 320-25 Mg  Tabs (Valsartan-Hydrochlorothiazide) .Marland Kitchen.. 1 Once Daily 6)  Co Q-10 120 Mg  Caps (Coenzyme Q10) .... Once Daily 7)  Synthroid 75 Mcg  Tabs (Levothyroxine Sodium) .... Once Daily 8)  Ultram 50 Mg  Tabs (Tramadol Hcl) .Marland Kitchen.. 1 By Mouth Q 4-6 Prn 9)  Vitamin D 1000 Unit  Tabs (Cholecalciferol) .Marland Kitchen..  1 Once Daily 10)  Mag-G 500 Mg  Tabs (Magnesium Gluconate) .... Two Times A Day 11)  Valium 5 Mg  Tabs (Diazepam) .... 1/4 Pill As Needed 12)  Metoprolol Tartrate 50 Mg Tabs (Metoprolol Tartrate) .... Take 1 Tablet By Mouth Once A Day 13)  Tussicaps 10-8 Mg Xr12h-Cap (Hydrocod Polst-Chlorphen Polst) .... One By Mouth  Two Times A Day For 7 Days 14)  Benzonatate 100 Mg Caps (Benzonatate) .... One Cap By Mouth Q 6 Hrs As Needed 15)  Aciphex 20 Mg Tbec (Rabeprazole Sodium) .... One Tablet By Mouth Once Daily  Allergies (verified): 1)  ! Vioxx  Past History:  Past Medical History: INTERNAL HEMORRHOIDS (ICD-455.0) DIVERTICULOSIS, COLON (ICD-562.10) GASTROPARESIS (ICD-536.3) GERD (ICD-530.81) ADENOMATOUS COLONIC POLYP (ICD-211.3) 02/2003 CELLULITIS&ABSCESS OF HAND EXCEPT FINGERS&THUMB (ICD-682.4) HYPERLIPIDEMIA (ICD-272.4) PERIPHERAL VASCULAR DISEASE (ICD-443.9) HYPOTHYROIDISM (ICD-244.9) HYPERTENSION (ICD-401.9) CORONARY ARTERY DISEASE (ICD-414.00) Fe Deficiency anemia  Past Surgical History: Appendectomy Hysterectomy Cervical Fusion  Family History: Family History of Colon Cancer:Brother at age 42  Sister at 63. Family History of Diabetes: Mother side of family  Social History: Reviewed history from 12/25/2008 and no changes required. Retired Married, 2 boys Never Smoked Alcohol use-no Drug use-no Regular exercise-yes Daily Caffeine Use 1 cup of 1/2 caf coffee in AM  Review of Systems       The pertinent positives and negatives are noted as above and in the HPI. All other ROS were reviewed and were negative.   Vital Signs:  Patient profile:   77 year old female Height:  62 inches Weight:      146 pounds BMI:     26.80 Pulse rate:   64 / minute Pulse rhythm:   regular BP sitting:   116 / 72  (left arm) Cuff size:   regular  Vitals Entered By: Francee Piccolo CMA Duncan Dull) (September 17, 2009 1:21 PM)  Physical Exam  General:  Well developed, well  nourished, no acute distress. Head:  Normocephalic and atraumatic. Mouth:  No deformity or lesions, dentition normal. Lungs:  Clear throughout to auscultation. Heart:  Regular rate and rhythm; no murmurs, rubs,  or bruits. Abdomen:  Soft, nontender and nondistended. No masses, hepatosplenomegaly or hernias noted. Normal bowel sounds. Psych:  Alert and cooperative. Normal mood and affect.  Impression & Recommendations:  Problem # 1:  GERD (ICD-530.81) She has refractory reflux symptoms that are poorly controlled on ranitidine. Discontinue ranitidine and continue AcipHex 20 mg q.a.m., along with standard antireflux measures. Previously she had required Nexium 40mg  twice daily to control symptoms. Despite the potential for proton pump inhibitor interactions with Plavix I feel that her reflux symptoms are severe enough to warrant the use of a proton pump inhibitor, other than omeprazole which carries a black box warning. We will start with a daily proton pump inhibitor and if this is not, adequate will increase to b.i.d. therapy. Rule out esophagitis, strictures and esophageal neoplasms. The risks, benefits and alternatives to endoscopy with possible biopsy and possible dilation were discussed with the patient and they consent to proceed. The procedure will be scheduled electively. Plan for 5 days hold on Plavix if cleared by her PCP. Orders: EGD (EGD)  Problem # 2:  PERSONAL HX COLONIC POLYPS (ICD-V12.72) Adenomatous colon polyps, initially diagnosed in July 2004. Surveillance colonoscopy recommended October 2012.  Problem # 3:  FAMILY HX COLON CANCER (ICD-V16.0) Sibling with colon cancer at age 9. Plan as in problem #2.  Problem # 4:  DYSPHAGIA (ICD-787.29) As in problem #1.  Patient Instructions: 1)  Continue to take Aciphex one tablet by mouth once daily. 2)  We will contact you about your Plavix clearance.  3)  Upper Endoscopy brochure given.  4)  Copy sent to : Darryll Capers, MD 5)   The medication list was reviewed and reconciled.  All changed / newly prescribed medications were explained.  A complete medication list was provided to the patient / caregiver.  Prescriptions: ACIPHEX 20 MG TBEC (RABEPRAZOLE SODIUM) one tablet by mouth once daily  #30 x 11   Entered by:   Christie Nottingham CMA (AAMA)   Authorized by:   Meryl Dare MD [facility]   Signed by:   Meryl Dare MD [facility] on 09/17/2009   Method used:   [facility]        [facility] [phone][facility] (retail)       [address]       [place], [place]  [address]       Ph: [phone]       Fax: [phone]   RxID:   [id number]

## 2010-09-12 NOTE — Progress Notes (Signed)
Summary: sinus complaints  Phone Note Call from Patient   Caller: Spouse Call For: Stacie Glaze MD Reason for Call: Acute Illness Summary of Call: Hycodan is not helping sinus stuffiness and congestion. [phone] Initial call taken by: Lynann Beaver CMA,  August 30, 2009 1:39 PM  Follow-up for Phone Call        per dr Lovell Sheehan- may have bromfed dm 8oz 2 tsp every 8 hours Follow-up by: Willy Eddy, LPN,  August 31, 2009 9:22 AM  Additional Follow-up for Phone Call Additional follow up Details #1::        Phone Call Completed Additional Follow-up by: Rudy Jew, RN,  August 31, 2009 9:33 AM    New/Updated Medications: BROMFED DM 30-2-10 MG/5ML SYRP (PSEUDOEPH-BROMPHEN-DM) 2 teaspoons every 8 hours Prescriptions: BROMFED DM 30-2-10 MG/5ML SYRP (PSEUDOEPH-BROMPHEN-DM) 2 teaspoons every 8 hours  #8 ounces x 0   Entered by:   Rudy Jew, RN   Authorized by:   Stacie Glaze MD   Signed by:   Rudy Jew, RN on 08/31/2009   Method used:   [facility]ronically to        [facility] [phone]* (retail)       [address]       [place], [place]  [address]       Ph: [phone]       Fax: [phone]   RxID:   [id number]

## 2010-09-12 NOTE — Assessment & Plan Note (Signed)
Summary: jaw pain/bmw   Vital Signs:  Patient profile:   77 year old female Temp:     98.7 degrees F oral BP sitting:   130 / 90  (left arm) Cuff size:   regular  Vitals Entered By: Sid Falcon LPN (Dec 12, 1608 3:14 PM) CC: headache, trouble swallowing, left ear discomfort X 5 days   History of Present Illness: Acute visit for poorly localized left facial pain around her mandible region. This started about 5 days ago. Possibly some mild parotid swelling off and on. No redness or fever. Pain poorly localized and she complains of some left ear pain as well as left sided neck pain with swallowing. No dysphagia. Denies any fever no facial rashes. Nonsmoker. No reflux symptoms. Has not taken anything for relief of the topical heat which helps slightly.  Allergies: 1)  ! Vioxx  Past History:  Past Medical History: Last updated: 11/26/2009 INTERNAL HEMORRHOIDS (ICD-455.0) DIVERTICULOSIS, COLON (ICD-562.10) GASTROPARESIS (ICD-536.3) GERD (ICD-530.81) ADENOMATOUS COLONIC POLYP (ICD-211.3) 02/2003 CELLULITIS&ABSCESS OF HAND EXCEPT FINGERS&THUMB (ICD-682.4) HYPERLIPIDEMIA (ICD-272.4) PERIPHERAL VASCULAR DISEASE (ICD-443.9) HYPOTHYROIDISM (ICD-244.9) HYPERTENSION (ICD-401.9) CORONARY ARTERY DISEASE (ICD-414.00) Fe Deficiency anemia Esophageal stricture  Review of Systems  The patient denies anorexia, fever, weight loss, decreased hearing, hoarseness, chest pain, and headaches.    Physical Exam  General:  Well-developed,well-nourished,in no acute distress; alert,appropriate and cooperative throughout examination Head:  patient has some subtle left parotid enlargement compared to the right. No warmth or erythema. No TMJ tenderness. Eyes:  pupils equal, pupils round, and pupils reactive to light.   Ears:  External ear exam shows no significant lesions or deformities.  Otoscopic examination reveals clear canals, tympanic membranes are intact bilaterally without bulging, retraction,  inflammation or discharge. Hearing is grossly normal bilaterally. Mouth:  Oral mucosa and oropharynx without lesions or exudates.  Teeth in good repair. Neck:  supple no adenopathy and no masses palpated. Lungs:  Normal respiratory effort, chest expands symmetrically. Lungs are clear to auscultation, no crackles or wheezes. Heart:  normal rate and regular rhythm.   Skin:  no rash.   Impression & Recommendations:  Problem # 1:  FACIAL PAIN (ICD-784.0) suspect she may have some early parotid gland enlargement without mass.  No evidence for abscess. Pain is somewhat poorly localized. No evidence for TMJ. No evidence for intraoral mass or abscess.  Observe, warm compresses to face and ENT eval if no better next couple of days. Her updated medication list for this problem includes:    Adult Aspirin Ec Low Strength 81 Mg Tbec (Aspirin) ..... Once daily    Ultram 50 Mg Tabs (Tramadol hcl) .Marland Kitchen... 1 by mouth q 4-6 prn    Metoprolol Tartrate 50 Mg Tabs (Metoprolol tartrate) .Marland Kitchen... Take 1 tablet by mouth once a day  Complete Medication List: 1)  Plavix 75 Mg Tabs (Clopidogrel bisulfate) .Marland Kitchen.. 1 once daily 2)  Adult Aspirin Ec Low Strength 81 Mg Tbec (Aspirin) .... Once daily 3)  Lipitor 40 Mg Tabs (Atorvastatin calcium) .Marland Kitchen.. 1 once daily 4)  Diovan Hct 320-25 Mg Tabs (Valsartan-hydrochlorothiazide) .Marland Kitchen.. 1 once daily 5)  Co Q-10 120 Mg Caps (Coenzyme q10) .... Once daily 6)  Synthroid 75 Mcg Tabs (Levothyroxine sodium) .... Once daily 7)  Ultram 50 Mg Tabs (Tramadol hcl) .Marland Kitchen.. 1 by mouth q 4-6 prn 8)  Vitamin D 1000 Unit Tabs (Cholecalciferol) .Marland Kitchen.. 1 once daily 9)  Mag-g 500 Mg Tabs (Magnesium gluconate) .... Two times a day 10)  Valium 5 Mg Tabs (Diazepam) .Marland KitchenMarland KitchenMarland Kitchen  1/4 pill as needed 11)  Metoprolol Tartrate 50 Mg Tabs (Metoprolol tartrate) .... Take 1 tablet by mouth once a day 12)  Nexium 40 Mg Cpdr (Esomeprazole magnesium) .Marland Kitchen.. 1 capsule by mouth two times a day x 2months then change to  aciphex.  Patient Instructions: 1)  avoid sour foods. 2)  Continue heat topically for symptom relief 3)  Take Tylenol or Ultram as needed for pain. 4)  followup immediately if you have any redness, increased warmth, or fever 5)  Drink plenty of fluids

## 2010-09-12 NOTE — Progress Notes (Signed)
Summary: Pt still cough up excessive chest congestion  Phone Note Call from Patient Call back at Home Phone [phone]   Caller: Patient Summary of Call: Pt still coughing up green chest congestion. Pt is wondering if she needs to get chest xray or can be worked in to see Dr. Lovell Sheehan. Pt is having difficultly breathing because of severe coughing.  Initial call taken by: Lucy Antigua,  September 10, 2009 10:27 AM     Appended Document: Pt still cough up excessive chest congestion ov given

## 2010-09-12 NOTE — Progress Notes (Signed)
Summary: REQUEST FOR RETURN CALL / MEDICATION CONCERNS  Phone Note Call from Patient   Caller: Patient Reason for Call: Acute Illness Summary of Call: Pt called to speak with [facility], LPN.... Adv she has questions concerning meds.... Call back #  [phone].  Initial call taken by: Debbra Riding,  August 28, 2010 10:59 AM  Follow-up for Phone Call        need to ch ange metoprolol to 50 of succinate- [facility] called at [phone] nuimber  [id number] Follow-up by: Willy Eddy, LPN,  August 28, 2010 12:33 PM    New/Updated Medications: METOPROLOL SUCCINATE 50 MG XR24H-TAB (METOPROLOL SUCCINATE) 1 once daily

## 2010-09-12 NOTE — Progress Notes (Signed)
Summary: REFILL REQUEST  Phone Note Refill Request Message from:  Patient on January 29, 2010 10:09 AM  Refills Requested: Medication #1:  ULTRAM 50 MG  TABS 1 by mouth Q 4-6 PRN   Notes: Pt [facility]    Initial call taken by: Debbra Riding,  January 29, 2010 10:10 AM    Prescriptions: Janean Sark 50 MG  TABS (TRAMADOL HCL) 1 by mouth Q 4-6 PRN  #180 x 3   Entered by:   Willy Eddy, LPN   Authorized by:   Stacie Glaze MD   Signed by:   Willy Eddy, LPN on 16/05/9603   Method used:   Electronically to        [facility] [phone]* (retail)       [address]       [place], [place]  [address]       Ph: [phone]       Fax: [phone]   RxID:   [id number]

## 2010-09-12 NOTE — Progress Notes (Signed)
Summary: viral illness?  Phone Note Call from Patient   Caller: Patient Call For: Stacie Glaze MD Reason for Call: Acute Illness Summary of Call: Pressure forehead and ears, low grade fever, and chest congestion.  Would like cough meds. [phone] [facility])  Initial call taken by: Lynann Beaver CMA,  August 28, 2009 10:14 AM  Follow-up for Phone Call        may have hycodan 1 tsp every 8 hours as needed 6 oz with 1 refill per dr Lovell Sheehan Follow-up by: Willy Eddy, LPN,  August 28, 2009 10:57 AM    New/Updated Medications: HYDROMET 5-1.5 MG/5ML SYRP (HYDROCODONE-HOMATROPINE) one teaspoon q 6-8 hrs as needed cough. Prescriptions: HYDROMET 5-1.5 MG/5ML SYRP (HYDROCODONE-HOMATROPINE) one teaspoon q 6-8 hrs as needed cough.  #6 oz. x 1   Entered by:   Lynann Beaver CMA   Authorized by:   Stacie Glaze MD   Signed by:   Lynann Beaver CMA on 08/28/2009   Method used:   Telephoned to ...       [facility] [phone]* (retail)       [address]       [place], [place]  [address]       Ph: [phone]       Fax: [phone]   RxID:   [id number]  Pt. notified.

## 2010-09-12 NOTE — Progress Notes (Signed)
Summary: Pt having pain in lft side of face. Diff Swallowing. Swelling  Phone Note Call from Patient Call back at [facility] Phone [phone]   Caller: Patient Reason for Call: Refill Medication, Insurance Question Summary of Call: Pt having pain in lft side of face and diff swallowing. Has swelling. This has been getting progressively worse since Friday.  Initial call taken by: Lucy Antigua,  Dec 12, 2009 10:58 AM  Follow-up for Phone Call        please triage Follow-up by: Willy Eddy, LPN,  Dec 13, 1306 11:06 AM  Additional Follow-up for Phone Call Additional follow up Details #1::        Pt is having headache, left ear pain, hurts in her jaw to swallow, gets worse daily, no sinus symptoms, some watering of her eyes, no fever.  No dental pain. Additional Follow-up by: Lynann Beaver CMA,  Dec 12, 2009 11:18 AM    Additional Follow-up for Phone Call Additional follow up Details #2::    pt wants to be seen today- appointment offered for 12:30 with dr swords but pt  she declined because she couldnt get her in an hour.  appointment given for am at 9:45am Follow-up by: Willy Eddy, LPN,  Dec 12, 6576 11:34 AM

## 2010-09-12 NOTE — Miscellaneous (Signed)
Summary: aciphex rx.  Clinical Lists Changes  Medications: Added new medication of ACIPHEX 20 MG  TBEC (RABEPRAZOLE SODIUM) Take 1 twice a day 30 minutes before meals - Signed Rx of ACIPHEX 20 MG  TBEC (RABEPRAZOLE SODIUM) Take 1 twice a day 30 minutes before meals;  #60 x 11;  Signed;  Entered by: Darlyn Read RN;  Authorized by: Meryl Dare MD [facility];  Method used: [facility] to [facility] [phone]*, [address] [place], [place], [place]  [address], Ph: [phone], Fax: [phone]    Prescriptions: ACIPHEX 20 MG  TBEC (RABEPRAZOLE SODIUM) Take 1 twice a day 30 minutes before meals  #60 x 11   Entered by:   Darlyn Read RN   Authorized by:   Meryl Dare MD [facility]   Signed by:   Darlyn Read RN on 09/24/2009   Method used:   Electronically to        [facility] [phone]* (retail)       [address]       [place], [place]  [address]       Ph: [phone]       Fax: [phone]   RxID:   [id number]

## 2010-09-12 NOTE — Assessment & Plan Note (Signed)
Summary: annual visit-fasting/bmw/PT RESCD FROM BUMP//CCM ok per bonni...   Vital Signs:  Patient profile:   77 year old female Height:      62 inches Weight:      144 pounds BMI:     26.43 Temp:     98.2 degrees F oral Pulse rate:   68 / minute Resp:     14 per minute BP sitting:   130 / 78  (left arm)  Vitals Entered By: Willy Eddy, LPN (February 18, 2010 9:29 AM)  Nutrition Counseling: Patient's BMI is greater than 25 and therefore counseled on weight management options. CC: annual visit for disease management-fasting this am  Does patient need assistance? Functional Status Self care Ambulation Normal  Vision Screening:Right eye with correction: 20 / 20 Both eyes with correction: 20 / 20       Vision Comments: reading glasses 40db HL: Left  Right  Audiometry Comment: whispered voice at 6 ft normal    Prevention & Chronic Care Immunizations   Influenza vaccine: Historical  (05/11/2009)   Influenza vaccine due: 04/11/2010    Tetanus booster: 02/18/2010: Td   Tetanus booster due: 02/19/2020    Pneumococcal vaccine: Pneumovax (Medicare)  (02/18/2010)    H. zoster vaccine: Not documented   H. zoster vaccine deferral: Not available  (02/18/2010)    Immunization comments: after age 34 pnemovax taday, zostravx ordered  Colorectal Screening   Hemoccult: Not documented    Colonoscopy: abnormal  (06/01/2006)   Colonoscopy due: 06/2011  Other Screening   Pap smear: normal  (05/10/2009)   Pap smear due: 05/2012    Mammogram: normal  (05/10/2009)   Mammogram due: 05/2010    DXA bone density scan: Not documented  Reports requested:   Last DXA report requested.  Smoking status: never  (02/18/2010)  Lipids   Total Cholesterol: 191  (10/12/2009)   Lipid panel action/deferral: Lipid Panel ordered   LDL: 94  (10/12/2009)   LDL Direct: 106.9  (02/05/2009)   HDL: 78.20  (10/12/2009)   Triglycerides: 95.0  (10/12/2009)   Lipid panel due: 08/21/2010   SGOT (AST): 29  (10/12/2009)   BMP action: Ordered   SGPT (ALT): 23  (10/12/2009)   Alkaline phosphatase: 73  (10/12/2009)   Total bilirubin: 0.8  (10/12/2009)   Liver panel due: 08/21/2010    Lipid flowsheet reviewed?: Yes  Hypertension   Last Blood Pressure: 130 / 78  (02/18/2010)   Serum creatinine: 0.9  (02/05/2009)   BMP action: Ordered   Serum potassium 4.0  (02/05/2009)    Hypertension flowsheet reviewed?: Yes   Progress toward BP goal: At goal  Self-Management Support :    Patient will work on the following items until the next clinic visit to reach self-care goals:     Medications and monitoring: take my medicines every day  (02/18/2010)     Eating: eat foods that are low in salt  (02/18/2010)     Activity: take a 30 minute walk every day  (02/18/2010)    Hypertension self-management support: BP self-monitoring log  (02/18/2010)    Lipid self-management support: Lipid monitoring log  (02/18/2010)    Nursing Instructions: Request report of last DXA scan    Primary Care Provider:  Darryll Capers MD  CC:  annual visit for disease management-fasting this am.  History of Present Illness: presents for prevention visit and formoniting of HTN and LIPIDS Here for Medicare AWV:  1.   Risk factors based on Past M, S, F  history: reviewed see chart 2.   Physical Activities: exercizing had been reduced due to recent heat wave 3.   Depression/mood: not depressed 4.   Hearing: whispered voice atv6 feet 5.   ADL's:   stable 6.   Fall Risk:  none 7.   Home Safety:  live on level 8.   Height, weight, &visual acuity:reviewed 9.   Counseling:  see forms 10.   Labs ordered based on risk factors: see orders 11.           Referral Coordination seeing no current subspecialist  12.           Care Plan see forms 13.            Cognitive Assessment  stable/ normal functionng   Preventive Screening-Counseling & Management  Alcohol-Tobacco     Smoking Status: never     Passive  Smoke Exposure: no  Problems Prior to Update: 1)  Facial Pain  (ICD-784.0) 2)  Esophageal Stricture  (ICD-530.3) 3)  Dysphagia  (ICD-787.29) 4)  Family Hx Colon Cancer  (ICD-V16.0) 5)  Personal Hx Colon Cancer  (ICD-V10.05) 6)  Cough Variant Asthma  (ICD-493.82) 7)  Personal Hx Colonic Polyps  (ICD-V12.72) 8)  Pneumonia, Organism Unspecified  (ICD-486) 9)  Cough  (ICD-786.2) 10)  Chronic Obstructive Pulmonary Disease, Mild  (ICD-496) 11)  Dyspnea On Exertion  (ICD-786.09) 12)  Uns Advrs Eff Uns Rx Medicinal&biological Sbstnc  (ICD-995.20) 13)  Palpitations, Recurrent  (ICD-785.1) 14)  Syncope  (ICD-780.2) 15)  Internal Hemorrhoids  (ICD-455.0) 16)  Diverticulosis, Colon  (ICD-562.10) 17)  Gastroparesis  (ICD-536.3) 18)  Gerd  (ICD-530.81) 19)  Adenomatous Colonic Polyp  (ICD-211.3) 20)  Cellulitis&abscess of Hand Except Fingers&thumb  (ICD-682.4) 21)  Hyperlipidemia  (ICD-272.4) 22)  Peripheral Vascular Disease  (ICD-443.9) 23)  Hypothyroidism  (ICD-244.9) 24)  Hypertension  (ICD-401.9) 25)  Coronary Artery Disease  (ICD-414.00)  Current Problems (verified): 1)  Facial Pain  (ICD-784.0) 2)  Esophageal Stricture  (ICD-530.3) 3)  Dysphagia  (ICD-787.29) 4)  Family Hx Colon Cancer  (ICD-V16.0) 5)  Personal Hx Colon Cancer  (ICD-V10.05) 6)  Cough Variant Asthma  (ICD-493.82) 7)  Personal Hx Colonic Polyps  (ICD-V12.72) 8)  Pneumonia, Organism Unspecified  (ICD-486) 9)  Cough  (ICD-786.2) 10)  Chronic Obstructive Pulmonary Disease, Mild  (ICD-496) 11)  Dyspnea On Exertion  (ICD-786.09) 12)  Uns Advrs Eff Uns Rx Medicinal&biological Sbstnc  (ICD-995.20) 13)  Palpitations, Recurrent  (ICD-785.1) 14)  Syncope  (ICD-780.2) 15)  Internal Hemorrhoids  (ICD-455.0) 16)  Diverticulosis, Colon  (ICD-562.10) 17)  Gastroparesis  (ICD-536.3) 18)  Gerd  (ICD-530.81) 19)  Adenomatous Colonic Polyp  (ICD-211.3) 20)  Cellulitis&abscess of Hand Except Fingers&thumb  (ICD-682.4) 21)   Hyperlipidemia  (ICD-272.4) 22)  Peripheral Vascular Disease  (ICD-443.9) 23)  Hypothyroidism  (ICD-244.9) 24)  Hypertension  (ICD-401.9) 25)  Coronary Artery Disease  (ICD-414.00)  Medications Prior to Update: 1)  Plavix 75 Mg Tabs (Clopidogrel Bisulfate) .Marland Kitchen.. 1 Once Daily 2)  Adult Aspirin Ec Low Strength 81 Mg  Tbec (Aspirin) .... Once Daily 3)  Lipitor 40 Mg Tabs (Atorvastatin Calcium) .Marland Kitchen.. 1 Once Daily 4)  Diovan Hct 320-25 Mg  Tabs (Valsartan-Hydrochlorothiazide) .Marland Kitchen.. 1 Once Daily 5)  Co Q-10 120 Mg  Caps (Coenzyme Q10) .... Once Daily 6)  Synthroid 75 Mcg  Tabs (Levothyroxine Sodium) .... Once Daily 7)  Ultram 50 Mg  Tabs (Tramadol Hcl) .Marland Kitchen.. 1 By Mouth Q 4-6 Prn 8)  Vitamin D 1000 Unit  Tabs (Cholecalciferol) .Marland Kitchen.. 1 Once Daily 9)  Mag-G 500 Mg  Tabs (Magnesium Gluconate) .... Two Times A Day 10)  Valium 5 Mg  Tabs (Diazepam) .... 1/4 Pill As Needed 11)  Metoprolol Tartrate 50 Mg Tabs (Metoprolol Tartrate) .... Take 1 Tablet By Mouth Once A Day 12)  Nexium 40 Mg Cpdr (Esomeprazole Magnesium) .Marland Kitchen.. 1 Capsule By Mouth Two Times A Day X 2months Then Change To Aciphex.  Current Medications (verified): 1)  Plavix 75 Mg Tabs (Clopidogrel Bisulfate) .Marland Kitchen.. 1 Once Daily 2)  Adult Aspirin Ec Low Strength 81 Mg  Tbec (Aspirin) .... Once Daily 3)  Lipitor 40 Mg Tabs (Atorvastatin Calcium) .Marland Kitchen.. 1 Once Daily 4)  Diovan Hct 320-25 Mg  Tabs (Valsartan-Hydrochlorothiazide) .Marland Kitchen.. 1 Once Daily 5)  Co Q-10 120 Mg  Caps (Coenzyme Q10) .... Once Daily 6)  Synthroid 75 Mcg  Tabs (Levothyroxine Sodium) .... Once Daily 7)  Ultram 50 Mg  Tabs (Tramadol Hcl) .Marland Kitchen.. 1 By Mouth Q 4-6 Prn 8)  Vitamin D 1000 Unit  Tabs (Cholecalciferol) .Marland Kitchen.. 1 Once Daily 9)  Mag-G 500 Mg  Tabs (Magnesium Gluconate) .... Two Times A Day 10)  Valium 5 Mg  Tabs (Diazepam) .... 1/4 Pill As Needed 11)  Metoprolol Tartrate 50 Mg Tabs (Metoprolol Tartrate) .... Take 1 Tablet By Mouth Once A Day 12)  Nexium 40 Mg Cpdr (Esomeprazole  Magnesium) .Marland Kitchen.. 1 Capsule By Mouth Two Times A Day X 2months Then Change To Aciphex.  Allergies (verified): 1)  ! Vioxx  Directives: 1)  Living Will   Past History:  Family History: Last updated: 09/17/2009 Family History of Colon Cancer:Brother at age 53  Sister at 54. Family History of Diabetes: Mother side of family  Social History: Last updated: 12/25/2008 Retired Married, 2 boys Never Smoked Alcohol use-no Drug use-no Regular exercise-yes Daily Caffeine Use 1 cup of 1/2 caf coffee in AM  Risk Factors: Exercise: yes (06/29/2007)  Risk Factors: Smoking Status: never (02/18/2010) Passive Smoke Exposure: no (02/18/2010)  Past medical, surgical, family and social histories (including risk factors) reviewed, and no changes noted (except as noted below).  Past Medical History: Reviewed history from 11/26/2009 and no changes required. INTERNAL HEMORRHOIDS (ICD-455.0) DIVERTICULOSIS, COLON (ICD-562.10) GASTROPARESIS (ICD-536.3) GERD (ICD-530.81) ADENOMATOUS COLONIC POLYP (ICD-211.3) 02/2003 CELLULITIS&ABSCESS OF HAND EXCEPT FINGERS&THUMB (ICD-682.4) HYPERLIPIDEMIA (ICD-272.4) PERIPHERAL VASCULAR DISEASE (ICD-443.9) HYPOTHYROIDISM (ICD-244.9) HYPERTENSION (ICD-401.9) CORONARY ARTERY DISEASE (ICD-414.00) Fe Deficiency anemia Esophageal stricture  Past Surgical History: Reviewed history from 09/17/2009 and no changes required. Appendectomy Hysterectomy Cervical Fusion  Family History: Reviewed history from 09/17/2009 and no changes required. Family History of Colon Cancer:Brother at age 76  Sister at 55. Family History of Diabetes: Mother side of family  Social History: Reviewed history from 12/25/2008 and no changes required. Retired Married, 2 boys Never Smoked Alcohol use-no Drug use-no Regular exercise-yes Daily Caffeine Use 1 cup of 1/2 caf coffee in AM  Review of Systems  The patient denies anorexia, fever, weight loss, weight gain, vision loss,  decreased hearing, hoarseness, chest pain, syncope, dyspnea on exertion, peripheral edema, prolonged cough, headaches, hemoptysis, abdominal pain, melena, hematochezia, severe indigestion/heartburn, hematuria, incontinence, genital sores, muscle weakness, suspicious skin lesions, transient blindness, difficulty walking, depression, unusual weight change, abnormal bleeding, enlarged lymph nodes, angioedema, and breast masses.    Physical Exam  General:  Well-developed,well-nourished,in no acute distress; alert,appropriate and cooperative throughout examination Head:  patient has some subtle left parotid enlargement compared to the right. No warmth or erythema. No TMJ tenderness. Eyes:  pupils  equal, pupils round, and pupils reactive to light.   Ears:  R ear normal and L ear normal.   Nose:  no nasal discharge.  no external deformity.   Neck:  supple no adenopathy and no masses palpated. Lungs:  Normal respiratory effort, chest expands symmetrically. Lungs are clear to auscultation, no crackles or wheezes. Heart:  normal rate and regular rhythm.   Abdomen:  Soft, nontender and nondistended. No masses, hepatosplenomegaly or hernias noted. Normal bowel sounds. Msk:  No deformity or scoliosis noted of thoracic or lumbar spine.   Pulses:  R and L carotid,radial,femoral,dorsalis pedis and posterior tibial pulses are full and equal bilaterally Neurologic:  alert & oriented X3 and finger-to-nose normal.     Impression & Recommendations:  Problem # 1:  PREVENTIVE HEALTH CARE (ICD-V70.0)  reveiwed and set plan for 2011 and 2012 pnemoniaand tetnus today will schedule the zostravax  Mammogram: normal (05/10/2009) Pap smear: normal (05/10/2009) Colonoscopy: abnormal (06/01/2006) Td Booster: Td (02/18/2010)   Flu Vax: Historical (05/11/2009)   Pneumovax: Pneumovax (Medicare) (02/18/2010) Chol: 191 (10/12/2009)   HDL: 78.20 (10/12/2009)   LDL: 94 (10/12/2009)   TG: 95.0 (10/12/2009) TSH: 0.45  (02/05/2009)   Next mammogram due:: 05/2010 (02/18/2010) Next Colonoscopy due:: 06/2011 (02/05/2009)  Discussed using sunscreen, use of alcohol, drug use, self breast exam, routine dental care, routine eye care, schedule for GYN exam, routine physical exam, seat belts, multiple vitamins, osteoporosis prevention, adequate calcium intake in diet, recommendations for immunizations, mammograms and Pap smears.  Discussed exercise and checking cholesterol.  Discussed gun safety, safe sex, and contraception.  Orders: First annual wellness visit with prevention plan  [phone])  Problem # 2:  GERD (ICD-530.81) stable without dysphagia Her updated medication list for this problem includes:    Nexium 40 Mg Cpdr (Esomeprazole magnesium) .Marland Kitchen... 1 capsule by mouth two times a day x 2months then change to aciphex.  Problem # 3:  HYPERLIPIDEMIA (ICD-272.4) reviewed and ordered labs Her updated medication list for this problem includes:    Lipitor 40 Mg Tabs (Atorvastatin calcium) .Marland Kitchen... 1 once daily  Orders: TLB-Lipid Panel (80061-LIPID)  Labs Reviewed: SGOT: 29 (10/12/2009)   SGPT: 23 (10/12/2009)  Lipid Goals: Chol Goal: 200 (08/02/2007)   HDL Goal: 40 (08/02/2007)   LDL Goal: 100 (08/02/2007)   TG Goal: 150 (08/02/2007)  Prior 10 Yr Risk Heart Disease: N/A (08/02/2007)   HDL:78.20 (10/12/2009), 51.60 (07/13/2009)  LDL:94 (10/12/2009), 111 (98/06/9146)  Chol:191 (10/12/2009), 181 (07/13/2009)  Trig:95.0 (10/12/2009), 91.0 (07/13/2009)  Problem # 4:  HYPERTENSION (ICD-401.9)  Her updated medication list for this problem includes:    Diovan Hct 320-25 Mg Tabs (Valsartan-hydrochlorothiazide) .Marland Kitchen... 1 once daily    Metoprolol Tartrate 50 Mg Tabs (Metoprolol tartrate) .Marland Kitchen... Take 1 tablet by mouth once a day  Orders: TLB-BMP (Basic Metabolic Panel-BMET) (80048-METABOL)  BP today: 130/78 Prior BP: 130/90 (12/12/2009)  Prior 10 Yr Risk Heart Disease: N/A (08/02/2007)  Labs Reviewed: K+: 4.0  (02/05/2009) Creat: : 0.9 (02/05/2009)   Chol: 191 (10/12/2009)   HDL: 78.20 (10/12/2009)   LDL: 94 (10/12/2009)   TG: 95.0 (10/12/2009)  Problem # 5:  HYPOTHYROIDISM (ICD-244.9)  Her updated medication list for this problem includes:    Synthroid 75 Mcg Tabs (Levothyroxine sodium) ..... Once daily  Labs Reviewed: TSH: 0.45 (02/05/2009)    Chol: 191 (10/12/2009)   HDL: 78.20 (10/12/2009)   LDL: 94 (10/12/2009)   TG: 95.0 (10/12/2009)  Orders: Venipuncture (82956) TLB-TSH (Thyroid Stimulating Hormone) (84443-TSH)  Complete Medication List: 1)  Plavix 75 Mg Tabs (Clopidogrel bisulfate) .Marland Kitchen.. 1 once daily 2)  Adult Aspirin Ec Low Strength 81 Mg Tbec (Aspirin) .... Once daily 3)  Lipitor 40 Mg Tabs (Atorvastatin calcium) .Marland Kitchen.. 1 once daily 4)  Diovan Hct 320-25 Mg Tabs (Valsartan-hydrochlorothiazide) .Marland Kitchen.. 1 once daily 5)  Co Q-10 120 Mg Caps (Coenzyme q10) .... Once daily 6)  Synthroid 75 Mcg Tabs (Levothyroxine sodium) .... Once daily 7)  Ultram 50 Mg Tabs (Tramadol hcl) .Marland Kitchen.. 1 by mouth q 4-6 prn 8)  Vitamin D 1000 Unit Tabs (Cholecalciferol) .Marland Kitchen.. 1 once daily 9)  Mag-g 500 Mg Tabs (Magnesium gluconate) .... Two times a day 10)  Valium 5 Mg Tabs (Diazepam) .... 1/4 pill as needed 11)  Metoprolol Tartrate 50 Mg Tabs (Metoprolol tartrate) .... Take 1 tablet by mouth once a day 12)  Nexium 40 Mg Cpdr (Esomeprazole magnesium) .Marland Kitchen.. 1 capsule by mouth two times a day x 2months then change to aciphex.  Other Orders: TD Toxoids IM 7 YR + ([address]) Admin 1st Vaccine (60454) Pneumococcal Vaccine (09811) Admin of Any Addtl Vaccine (91478) TLB-Hepatic/Liver Function Pnl (80076-HEPATIC)  Patient Instructions: 1)  Please schedule a follow-up appointment in 6 months. 2)  BMP prior to visit, ICD-9:401.90 3)  Hepatic Panel prior to visit, ICD-9:995.20 4)  Lipid Panel prior to visit, ICD-9:272.4 5)  TSH prior to visit, ICD-9:244.9   Immunization History:  Influenza Immunization History:     Influenza:  historical (05/11/2009)  Immunizations Administered:  Tetanus Vaccine:    Vaccine Type: Td    Site: right deltoid    Mfr: Sanofi Pasteur    Dose: 0.5 ml    Given by: Willy Eddy, LPN    Exp. Date: 08/24/2011    Lot #: [id number]  Pneumonia Vaccine:    Vaccine Type: Pneumovax (Medicare)    Site: right deltoid    Mfr: Merck    Dose: 0.5 ml    Route: IM    Given by: Willy Eddy, LPN    Exp. Date: 06/05/2011    Lot #: [id number]    Preventive Care Screening  Mammogram:    Date:  05/10/2009    Next Due:  05/2010    Results:  normal   Pap Smear:    Date:  05/10/2009    Next Due:  05/2012    Results:  normal

## 2010-09-12 NOTE — Letter (Signed)
Summary: EGD Instructions  [facility]  [address] [place], [place] [address]   Phone: [phone]  Fax: [phone]       MAKALYN LENNOX    17-Nov-1933    MRN: [id number]       Procedure Day /Date:Monday February 14th, 2011     Arrival Time:  10:00am     Procedure Time: 11:00am     Location of Procedure:                    _ x _ [facility] (4th Floor)    PREPARATION FOR ENDOSCOPY   On  09/24/09  THE DAY OF THE PROCEDURE:  1.   No solid foods, milk or milk products are allowed after midnight the night before your procedure.  2.   Do not drink anything colored red or purple.  Avoid juices with pulp.  No orange juice.  3.  You may drink clear liquids until 9:00am , which is 2 hours before your procedure.                                                                                                CLEAR LIQUIDS INCLUDE: Water Jello Ice Popsicles Tea (sugar ok, no milk/cream) Powdered fruit flavored drinks Coffee (sugar ok, no milk/cream) Gatorade Juice: apple, white grape, white cranberry  Lemonade Clear bullion, consomm, broth Carbonated beverages (any kind) Strained chicken noodle soup Hard Candy   MEDICATION INSTRUCTIONS  Unless otherwise instructed, you should take regular prescription medications with a small sip of water as early as possible the morning of your procedure.  STAY ON PLAVIX!         OTHER INSTRUCTIONS  You will need a responsible adult at least 75 years of age to accompany you and drive you home.   This person must remain in the waiting room during your procedure.  Wear loose fitting clothing that is easily removed.  Leave jewelry and other valuables at home.  However, you may wish to bring a book to read or an iPod/MP3 player to listen to music as you wait for your procedure to start.  Remove all body piercing jewelry and leave at home.  Total time from sign-in until discharge is approximately 2-3  hours.  You should go home directly after your procedure and rest.  You can resume normal activities the day after your procedure.  The day of your procedure you should not:   Drive   Make legal decisions   Operate machinery   Drink alcohol   Return to work  You will receive specific instructions about eating, activities and medications before you leave.    The above instructions have been reviewed and explained to me by   Marchelle Folks.     I fully understand and can verbalize these instructions _____________________________ Date _________     Appended Document: EGD Instructions Dr. Russella Dar wants pt to stop Plavix 5 days before procedure. Pt was told verbally in office and told her we would send a letter to Dr. Lovell Sheehan for approval. We will contact pt after oder  is sent back from Dr. Lovell Sheehan.

## 2010-09-12 NOTE — Letter (Signed)
Summary: Anticoagulation Modification Letter  [facility]  [address] [place], [place] [address]   Phone: [phone]  Fax: [phone]    September 17, 2009  Re:    Kathleen Thompson DOB:    Jun 09, 1934 MRN:    [id number]    Dear Dr. Lovell Sheehan,  We have scheduled the above patient for an endoscopic procedure. Our records show that  he/she is on anticoagulation therapy. Please advise as to how long the patient may come off their therapy of Plavix prior to the scheduled procedure(s) on 09/24/09.   Please fax back/or route the completed form to [place] at [phone].  Thank you for your help with this matter.  Sincerely,  Christie Nottingham CMA [facility])   Physician Recommendation:  Hold Plavix 7 days prior ________________  Hold Coumadin 5 days prior ____________  Other ______________________________

## 2010-09-12 NOTE — Assessment & Plan Note (Signed)
Summary: 8 weeks f.u after procedure...em   History of Present Illness Visit Type: Follow-up Visit Primary GI MD: Claudette Head MD [facility] Primary Provider: Darryll Capers MD Chief Complaint: 8 week follow-up after endoscopy is doing much better since restarting Nexium History of Present Illness:   Kathleen Thompson returns for followup of dysphasia and GERD. Dysphagia resolved following dilation. Her reflux symptoms are under very good control on Nexium twice daily and intensifing her antireflux measures.   GI Review of Systems      Denies abdominal pain, acid reflux, belching, bloating, chest pain, dysphagia with liquids, dysphagia with solids, heartburn, loss of appetite, nausea, vomiting, vomiting blood, weight loss, and  weight gain.        Denies anal fissure, black tarry stools, change in bowel habit, constipation, diarrhea, diverticulosis, fecal incontinence, heme positive stool, hemorrhoids, irritable bowel syndrome, jaundice, light color stool, liver problems, rectal bleeding, and  rectal pain.   Current Medications (verified): 1)  Plavix 75 Mg Tabs (Clopidogrel Bisulfate) .Marland Kitchen.. 1 Once Daily 2)  Adult Aspirin Ec Low Strength 81 Mg  Tbec (Aspirin) .... Once Daily 3)  Lipitor 40 Mg Tabs (Atorvastatin Calcium) .Marland Kitchen.. 1 Once Daily 4)  Diovan Hct 320-25 Mg  Tabs (Valsartan-Hydrochlorothiazide) .Marland Kitchen.. 1 Once Daily 5)  Co Q-10 120 Mg  Caps (Coenzyme Q10) .... Once Daily 6)  Synthroid 75 Mcg  Tabs (Levothyroxine Sodium) .... Once Daily 7)  Ultram 50 Mg  Tabs (Tramadol Hcl) .Marland Kitchen.. 1 By Mouth Q 4-6 Prn 8)  Vitamin D 1000 Unit  Tabs (Cholecalciferol) .Marland Kitchen.. 1 Once Daily 9)  Mag-G 500 Mg  Tabs (Magnesium Gluconate) .... Two Times A Day 10)  Valium 5 Mg  Tabs (Diazepam) .... 1/4 Pill As Needed 11)  Metoprolol Tartrate 50 Mg Tabs (Metoprolol Tartrate) .... Take 1 Tablet By Mouth Once A Day 12)  Nexium 40 Mg Cpdr (Esomeprazole Magnesium) .Marland Kitchen.. 1 Capsule By Mouth Two Times A Day X 2months Then Change To  Aciphex.  Allergies (verified): 1)  ! Vioxx  Past History:  Past Medical History: INTERNAL HEMORRHOIDS (ICD-455.0) DIVERTICULOSIS, COLON (ICD-562.10) GASTROPARESIS (ICD-536.3) GERD (ICD-530.81) ADENOMATOUS COLONIC POLYP (ICD-211.3) 02/2003 CELLULITIS&ABSCESS OF HAND EXCEPT FINGERS&THUMB (ICD-682.4) HYPERLIPIDEMIA (ICD-272.4) PERIPHERAL VASCULAR DISEASE (ICD-443.9) HYPOTHYROIDISM (ICD-244.9) HYPERTENSION (ICD-401.9) CORONARY ARTERY DISEASE (ICD-414.00) Fe Deficiency anemia Esophageal stricture  Past Surgical History: Reviewed history from 09/17/2009 and no changes required. Appendectomy Hysterectomy Cervical Fusion  Family History: Reviewed history from 09/17/2009 and no changes required. Family History of Colon Cancer:Brother at age 26  Sister at 75. Family History of Diabetes: Mother side of family  Social History: Reviewed history from 12/25/2008 and no changes required. Retired Married, 2 boys Never Smoked Alcohol use-no Drug use-no Regular exercise-yes Daily Caffeine Use 1 cup of 1/2 caf coffee in AM  Review of Systems       The pertinent positives and negatives are noted as above and in the HPI. All other ROS were reviewed and were negative.   Vital Signs:  Patient profile:   77 year old female Height:      62 inches Weight:      145.38 pounds BMI:     26.69 Pulse rate:   68 / minute Pulse rhythm:   regular BP sitting:   104 / 64  (left arm)  Vitals Entered By: Milford Cage NCMA (November 26, 2009 3:11 PM)  Physical Exam  General:  Well developed, well nourished, no acute distress. Head:  Normocephalic and atraumatic. Eyes:  PERRLA, no icterus. Mouth:  No  deformity or lesions, dentition normal. Lungs:  Clear throughout to auscultation. Heart:  Regular rate and rhythm; no murmurs, rubs,  or bruits. Abdomen:  Soft, nontender and nondistended. No masses, hepatosplenomegaly or hernias noted. Normal bowel sounds. Psych:  Alert and cooperative. Normal  mood and affect.  Impression & Recommendations:  Problem # 1:  GERD (ICD-530.81) Reflux symptoms, under excellent control. Continue all standard antireflux measures.she will change to AcipHex 20 mg twice daily in the future.  Problem # 2:  ESOPHAGEAL STRICTURE (ICD-530.3) Dysphagia resolved following dilation. Plans as above.  Problem # 3:  PERSONAL HX COLONIC POLYPS (ICD-V12.72) Personal history of adenomatous colon polyps. Surveillance colonoscopy recommended in October 2012.  Patient Instructions: 1)  Please schedule a follow-up appointment in 1 year. 2)  Please continue current medications.  3)  The medication list was reviewed and reconciled.  All changed / newly prescribed medications were explained.  A complete medication list was provided to the patient / caregiver.

## 2010-09-18 NOTE — Letter (Signed)
Summary: [facility]   Imported By: Maryln Gottron 09/12/2010 13:39:21  _____________________________________________________________________  External Attachment:    Type:   Image     Comment:   External Document

## 2010-09-23 ENCOUNTER — Other Ambulatory Visit: Payer: Self-pay | Admitting: Internal Medicine

## 2010-09-23 MED ORDER — DIAZEPAM 5 MG PO TABS: 5.0000 mg | ORAL_TABLET | Freq: Three times a day (TID) | ORAL | Status: DC | PRN

## 2010-10-17 ENCOUNTER — Telehealth: Payer: Self-pay | Admitting: Internal Medicine

## 2010-10-17 DIAGNOSIS — I10 Essential (primary) hypertension: Secondary | ICD-10-CM

## 2010-10-17 MED ORDER — LOSARTAN POTASSIUM-HCTZ 100-25 MG PO TABS: 1.0000 | ORAL_TABLET | Freq: Every day | ORAL | Status: DC

## 2010-10-17 NOTE — Telephone Encounter (Signed)
Please advise 

## 2010-10-17 NOTE — Telephone Encounter (Signed)
Would like more Diovan samples until her may's appt. Wants nurse to call her when they are ready.

## 2010-10-17 NOTE — Telephone Encounter (Signed)
She takes diovan320/25-none available- pt states cost is too much- can she switch to something else?

## 2010-10-17 NOTE — Telephone Encounter (Signed)
She also takes metoprolol succinate 50 qd

## 2010-10-25 ENCOUNTER — Other Ambulatory Visit: Payer: Self-pay | Admitting: Internal Medicine

## 2010-11-01 LAB — BASIC METABOLIC PANEL
BUN: 13 mg/dL (ref 6–23)
Chloride: 103 mEq/L (ref 96–112)
Potassium: 3.7 mEq/L (ref 3.5–5.1)

## 2010-11-01 LAB — POCT CARDIAC MARKERS
Myoglobin, poc: 103 ng/mL (ref 12–200)
Troponin i, poc: <0.05 ng/mL (ref 0.00–0.09)

## 2010-11-01 LAB — CBC
HCT: 41.7 % (ref 36.0–46.0)
MCV: 94.9 fL (ref 78.0–100.0)
Platelets: 298 10*3/uL (ref 150–400)
RBC: 4.39 MIL/uL (ref 3.87–5.11)
WBC: 4.8 10*3/uL (ref 4.0–10.5)

## 2010-11-01 LAB — DIFFERENTIAL
Basophils Absolute: 0 10*3/uL (ref 0.0–0.1)
Eosinophils Absolute: 0 10*3/uL (ref 0.0–0.7)
Eosinophils Relative: 1 % (ref 0–5)
Lymphocytes Relative: 42 % (ref 12–46)
Neutrophils Relative %: 51 % (ref 43–77)

## 2010-12-13 ENCOUNTER — Other Ambulatory Visit: Payer: Self-pay

## 2010-12-20 ENCOUNTER — Ambulatory Visit: Payer: Self-pay | Admitting: Internal Medicine

## 2010-12-24 NOTE — Assessment & Plan Note (Signed)
[facility]                            CARDIOLOGY OFFICE NOTE   RENIE, STELMACH                       MRN:          [id number]  DATE:02/18/2008                            DOB:          12-31-1933    Ms. Kathleen Thompson is seen today at the request of Dr. Lovell Sheehan.  She has had  multiple complaints.  I did see the patient back, I believe, in 2004.   The patient is related to Ron and Venita Lick, good friend of mine.  Her husband is Ron Smalling's father.   When I saw her back in 2004, there was a question of an abnormal  Cardiolite study.  Her EKG was positive.  Scintigraphic imaging normal  and I did not think she needed a heart cath.  She has done well since  then.  She subsequently had a followup Cardiolite in 2007 which was also  nonischemic with a normal EF.  She had been having increased  palpitations.  Dr. Lovell Sheehan gave her an event monitor that she has had  for about 3 weeks.  I reviewed the event monitor at length.  She had no  significant arrhythmias and only occasional PAC and PVC.   The patient says her palpitations are intermittent.  They are flip-flop.  She never has any long sustained runs.  They are not associated with  diaphoresis or shortness of breath.   The patient is on chronic Toprol.   She does get some exertional dyspnea.  It may be from deconditioning.  She has no chronic lung disease.  There is no PND, orthopnea.  She has  not had increased lower extremity edema.   She is on Plavix; when asked her why, she said it was for her  circulation.  She has had pain in the calves and legs in the past;  however, she had totally normal ABIs in 2007, and no evidence of  objective PVD.   I told her frankly I did not think that she needed to be on Plavix.   REVIEW OF SYSTEMS:  Otherwise remarkable for an episode where she had  some nausea.  This was 2 months ago.  It was about 11 o'clock at night.  She had fairly profound nausea and then  felt presyncopal.  She did not  actually pass out.  The episode lasted about 45 minutes.  It was not  really associated with any prolonged palpitations, although she did feel  flip-flops after the event.  It sounds more like a vagal reaction.  It  has not recurred.   PAST MEDICAL HISTORY:  Remarkable for hypothyroidism, hypertension.  Family history for PVD, coronary disease, and diabetes, history of  internal hemorrhoids, history of diverticulosis, history of  gastroparesis, GERD, previous adenomatous polyps, hyperlipidemia.   PAST SURGICAL HISTORY:  Remarkable for hysterectomy and appendectomy.   FAMILY HISTORY:  Remarkable for colon cancer and premature coronary  disease as well as diabetes.   SOCIAL HISTORY:  Patient is retired.  She is married.  She has never  smoked.  She does not drink.  She is fairly sedentary,  but does walk on  a regular basis.   ALLERGIES:  She is allergic to VIOXX.   The patient also has had a fairly marked history of anemia.  This was  likely due to her polyps.  She did require blood transfusions back in  12-26-02 for this.   Mother died at age 75 of a heart attack.  Father died at age 75 of a  heart attack.   CURRENT MEDICATIONS:  1. She is currently taking Synthroid 25 mcg a day.  2. Toprol 50 mcg a day.  3. Plavix 75 a day.  4. Tramadol.  5. An aspirin a day.  6. Nexium 40 a day.  7. Lipitor 40 a day.  8. Diovan 160/12.5.  9. Coenzyme Q.   PHYSICAL EXAMINATION:  GENERAL:  Her exam is remarkable for a well-  preserved elderly white female in no distress.  VITAL SIGNS:  Weight is 148, blood pressure is 138/70, pulse 60 and  regular, respiratory rate 14, afebrile.  Affect appropriate.  HEENT:  Unremarkable.  Carotids are normal without bruit, no lymphadenopathy,  thyromegaly, JVP elevation.  LUNGS:  Clear.  Good diaphragmatic motion.  No wheezing.  HEART:  S1, S2 with normal heart sounds.  PMI normal.  ABDOMEN:  Benign.  Bowel sounds positive.   No AAA, no tenderness, no  hepatosplenomegaly, no hepatojugular reflex, no bruit.  Status post  hysterectomy and appendectomy.  EXTREMITIES:  Distal pulses are intact.  No edema.  NEUROLOGICAL:  Nonfocal.  SKIN: Warm and dry.  MUSCULOSKELETAL:  No muscular weakness.   Baseline EKG shows sinus rhythm, nonspecific ST-T wave changes.  QT  interval is 430.   As indicated, her life watch monitor was reviewed at length.   IMPRESSION:  1. Palpitation sound, benign.  No significant arrhythmia on life      watch.  Continue for another 3-4 weeks.  Continue beta-blocker.  2. Exertional dyspnea and palpitations.  Check stress echocardiogram.      Rule out significant structural heart disease or exercise-induced      exacerbation.  3. Episode of presyncope, sounds like a vagal episode so long as her      stress echo and monitor do not show anything.  I do not think this      needs further workup.  4. Pain in her legs, particularly with ambulation uphill.  She has no      objective evidence of claudication.  She has had normal ABIs in      25-Dec-2005.  She has +3 posterior tibial pulses bilaterally.  We will      check her ABIs to document this, but I do not think she needs to be      on Plavix.  I will leave this up to Dr. Tawanna Cooler to stop.  5. Hypertension, currently well controlled.  Continue current dose of      ARB and diuretic.  6. History of a gastrointestinal bleed with polyps requiring      transfusion.  I think this is all the more reason she should be off      Plavix.  7. Follow up hemoglobin, creatinine, MCV per Dr. Tawanna Cooler.  8. Hypothyroidism.  Continue Synthroid.  TSH and T4 at least yearly.   Further recommendations will be based on the results of her ABIs and  stress echo, but I suspect she can be seen on a yearly basis.     Noralyn Pick. Eden Emms, MD, [facility]  Electronically Signed  PCN/MedQ  DD: 02/18/2008  DT: 02/19/2008  Job #: [id number]

## 2010-12-27 NOTE — Assessment & Plan Note (Signed)
[facility]                           GASTROENTEROLOGY OFFICE NOTE   HENRIETTA, CIESLEWICZ                       MRN:          [id number]  DATE:04/14/2006                            DOB:          [birth year]    Ms. Kathleen Thompson is a 77 year old white female with a history of adenomatous colon  polyps.  She has a brother who developed colon cancer at age 62.  Her last  colonoscopy was in July of 2004.  She relates no ongoing colorectal  complaints, and specifically denies any change in bowel habits, change in  stool caliber, melena, hematochezia, constipation, diarrhea or weight loss.  Her reflux symptoms are well-controlled on Nexium.  She has been maintained  on Plavix for the past several years for peripheral vascular disease.   MEDICATIONS:  Listed on the maintenance medication sheet, updated and  reviewed.   MEDICATION ALLERGIES:  VIOXX, leading to edema.   PHYSICAL EXAMINATION:  In no acute distress.  Weight 134.4 pounds, blood  pressure 118/74, pulse 84 and regular.  HEENT:  Anicteric sclerae, oropharynx clear.  CHEST:  Clear to auscultation bilaterally.  CARDIAC:  Regular rate and rhythm without murmurs appreciated.  ABDOMEN:  Soft and nontender, with normoactive bowel sounds, no palpable  organomegaly, masses or hernias.  RECTAL:  Exam deferred to time of colonoscopy.  EXTREMITIES:  Without clubbing, cyanosis or edema.  NEUROLOGIC:  Alert and oriented x3.  Grossly nonfocal.   ASSESSMENT AND PLAN:  Personal history of adenomatous colon polyps and a  first degree relative with colon cancer.  Rule out colorectal neoplasms.  Risks, benefits and alternatives to colonoscopy with possible biopsy and  possible polypectomy were discussed with the patient, and she consents to  proceed.  This will be scheduled electively in late October at her request.  Plavix will be held for 7 days prior to the procedure.  We will obtain  clearance from Dr. Lovell Sheehan.   She may remain on aspirin 81 mg q. day.                                   Venita Lick. Russella Dar, MD, [facility]   MTS/MedQ  DD:  04/14/2006  DT:  04/15/2006  Job #:  [id number]   cc:   Stacie Glaze, MD

## 2011-02-05 ENCOUNTER — Ambulatory Visit (INDEPENDENT_AMBULATORY_CARE_PROVIDER_SITE_OTHER): Payer: Medicare Other | Admitting: Gastroenterology

## 2011-02-05 ENCOUNTER — Encounter: Payer: Self-pay | Admitting: Gastroenterology

## 2011-02-05 VITALS — BP 124/62 | HR 68 | Ht 62.0 in | Wt 149.0 lb

## 2011-02-05 DIAGNOSIS — K219 Gastro-esophageal reflux disease without esophagitis: Secondary | ICD-10-CM

## 2011-02-05 DIAGNOSIS — Z86010 Personal history of colonic polyps: Secondary | ICD-10-CM

## 2011-02-05 MED ORDER — RABEPRAZOLE SODIUM 20 MG PO TBEC: 20.0000 mg | DELAYED_RELEASE_TABLET | Freq: Two times a day (BID) | ORAL | Status: DC

## 2011-02-05 NOTE — Progress Notes (Signed)
History of Present Illness: This is a return office visit for GERD, history of an esophageal stricture, history of mild gastroparesis and a history of adenomatous colon polyps. She states her reflux symptoms are well controlled on AcipHex twice daily. She states that Nexium works better however this is not covered by her insurance so she would prefers to remain on AcipHex. She denies dysphagia, odynophagia, abdominal pain, weight loss, change in bowel habits, melena, hematochezia  Current Medications, Allergies, Past Medical History, Past Surgical History, Family History and Social History were reviewed in [employer] record.  Physical Exam: General: Well developed , well nourished, no acute distress Head: Normocephalic and atraumatic Eyes:  sclerae anicteric, EOMI Ears: Normal auditory acuity Mouth: No deformity or lesions Lungs: Clear throughout to auscultation Heart: Regular rate and rhythm; no murmurs, rubs or bruits Abdomen: Soft, non tender and non distended. No masses, hepatosplenomegaly or hernias noted. Normal Bowel sounds Musculoskeletal: Symmetrical with no gross deformities  Pulses:  Normal pulses noted Extremities: No clubbing, cyanosis, edema or deformities noted Neurological: Alert oriented x 4, grossly nonfocal Psychological:  Alert and cooperative. Normal mood and affect  Assessment and Recommendations:  1. GERD. Continue AcipHex twice daily and standard antireflux measures.  2. History of adenomatous colon polyps, initially diagnosed in 2004. Surveillance colonoscopy recommended October 2012.  3. Brother with colon cancer at age 75. Colonoscopy as above.

## 2011-02-05 NOTE — Patient Instructions (Signed)
Your prescription for Aciphex has been sent to your pharmacy.  Nexium samples given to take prior to picking up your prescription.

## 2011-02-06 ENCOUNTER — Encounter: Payer: Self-pay | Admitting: Gastroenterology

## 2011-02-25 ENCOUNTER — Other Ambulatory Visit: Payer: Self-pay | Admitting: *Deleted

## 2011-02-26 ENCOUNTER — Telehealth: Payer: Self-pay | Admitting: *Deleted

## 2011-02-26 ENCOUNTER — Encounter: Payer: Self-pay | Admitting: Internal Medicine

## 2011-02-26 ENCOUNTER — Ambulatory Visit (INDEPENDENT_AMBULATORY_CARE_PROVIDER_SITE_OTHER): Payer: Medicare Other | Admitting: Internal Medicine

## 2011-02-26 DIAGNOSIS — J4489 Other specified chronic obstructive pulmonary disease: Secondary | ICD-10-CM

## 2011-02-26 DIAGNOSIS — R0609 Other forms of dyspnea: Secondary | ICD-10-CM

## 2011-02-26 DIAGNOSIS — J449 Chronic obstructive pulmonary disease, unspecified: Secondary | ICD-10-CM

## 2011-02-26 DIAGNOSIS — R0602 Shortness of breath: Secondary | ICD-10-CM

## 2011-02-26 DIAGNOSIS — Z23 Encounter for immunization: Secondary | ICD-10-CM

## 2011-02-26 DIAGNOSIS — Z0000 Encounter for general adult medical examination without abnormal findings: Secondary | ICD-10-CM

## 2011-02-26 DIAGNOSIS — E039 Hypothyroidism, unspecified: Secondary | ICD-10-CM

## 2011-02-26 DIAGNOSIS — M810 Age-related osteoporosis without current pathological fracture: Secondary | ICD-10-CM

## 2011-02-26 DIAGNOSIS — K219 Gastro-esophageal reflux disease without esophagitis: Secondary | ICD-10-CM

## 2011-02-26 DIAGNOSIS — R0989 Other specified symptoms and signs involving the circulatory and respiratory systems: Secondary | ICD-10-CM

## 2011-02-26 DIAGNOSIS — E785 Hyperlipidemia, unspecified: Secondary | ICD-10-CM

## 2011-02-26 DIAGNOSIS — I10 Essential (primary) hypertension: Secondary | ICD-10-CM

## 2011-02-26 DIAGNOSIS — I2510 Atherosclerotic heart disease of native coronary artery without angina pectoris: Secondary | ICD-10-CM

## 2011-02-26 LAB — CBC WITH DIFFERENTIAL/PLATELET
Basophils Relative: 0.5 % (ref 0.0–3.0)
Eosinophils Relative: 1.5 % (ref 0.0–5.0)
HCT: 38.4 % (ref 36.0–46.0)
Hemoglobin: 13 g/dL (ref 12.0–15.0)
MCV: 87.1 fl (ref 78.0–100.0)
Monocytes Absolute: 0.3 10*3/uL (ref 0.1–1.0)
Neutro Abs: 2.4 10*3/uL (ref 1.4–7.7)
Neutrophils Relative %: 46.9 % (ref 43.0–77.0)
RBC: 4.41 Mil/uL (ref 3.87–5.11)
WBC: 5 10*3/uL (ref 4.5–10.5)

## 2011-02-26 LAB — HEPATIC FUNCTION PANEL
ALT: 20 U/L (ref 0–35)
AST: 29 U/L (ref 0–37)
Albumin: 4.6 g/dL (ref 3.5–5.2)
Alkaline Phosphatase: 82 U/L (ref 39–117)
Bilirubin, Direct: 0 mg/dL (ref 0.0–0.3)
Total Protein: 7.9 g/dL (ref 6.0–8.3)

## 2011-02-26 LAB — POCT URINALYSIS DIPSTICK
Bilirubin, UA: NEGATIVE
Ketones, UA: NEGATIVE
Spec Grav, UA: 1.015
pH, UA: 7.5

## 2011-02-26 LAB — BASIC METABOLIC PANEL
CO2: 30 mEq/L (ref 19–32)
Chloride: 104 mEq/L (ref 96–112)
Glucose, Bld: 115 mg/dL — ABNORMAL HIGH (ref 70–99)
Sodium: 141 mEq/L (ref 135–145)

## 2011-02-26 MED ORDER — ZOSTER VACCINE LIVE 19400 UNT/0.65ML ~~LOC~~ SOLR: 0.6500 mL | Freq: Once | SUBCUTANEOUS | Status: DC

## 2011-02-26 NOTE — Progress Notes (Signed)
Subjective:    Kathleen Thompson is a 77 y.o. female who presents for Medicare Annual/Subsequent preventive examination. We have discussed of calcium supplementation and her shortness of breath.  She does have some exertional shortness of breath and has a history of mild COPD/asthma and I suspect that her Lopressor plays a role in her shortness of breath at this point she is very concerned about changing medications due to the stability of her cardiac disease but if the shortness of breath worsens I would recommending changing the metoprolol to carvedilol  Preventive Screening-Counseling & Management  Tobacco History  Smoking status  . Never Smoker   Smokeless tobacco  . Never Used     Problems Prior to Visit 1.   Current Problems (verified) Patient Active Problem List  Diagnoses  . ADENOMATOUS COLONIC POLYP  . HYPOTHYROIDISM  . HYPERLIPIDEMIA  . HYPERTENSION  . CORONARY ARTERY DISEASE  . PERIPHERAL VASCULAR DISEASE  . INTERNAL HEMORRHOIDS  . PNEUMONIA, ORGANISM UNSPECIFIED  . COUGH VARIANT ASTHMA  . CHRONIC OBSTRUCTIVE PULMONARY DISEASE, MILD  . ESOPHAGEAL STRICTURE  . GERD  . GASTROPARESIS  . DIVERTICULOSIS, COLON  . CELLULITIS&ABSCESS OF HAND EXCEPT FINGERS&THUMB  . SYNCOPE  . FACIAL PAIN  . PALPITATIONS, RECURRENT  . DYSPNEA ON EXERTION  . COUGH  . DYSPHAGIA  . UNS ADVRS EFF UNS RX MEDICINAL&BIOLOGICAL SBSTNC  . Family history of malignant neoplasm of gastrointestinal tract  . PERSONAL HX COLONIC POLYPS    Medications Prior to Visit Current Outpatient Prescriptions on File Prior to Visit  Medication Sig Dispense Refill  . atorvastatin (LIPITOR) 40 MG tablet TAKE 1 TABLET ONCE DAILY  30 tablet  11  . diazepam (VALIUM) 5 MG tablet Take 5 mg by mouth. 1/2 to 5 mg as needed       . losartan-hydrochlorothiazide (HYZAAR) 100-25 MG per tablet Take 1 tablet by mouth daily.  30 tablet  11  . metoprolol (LOPRESSOR) 50 MG tablet Take 50 mg by mouth daily.        Marland Kitchen  PLAVIX 75 MG tablet One once daily       . RABEprazole (ACIPHEX) 20 MG tablet Take 1 tablet (20 mg total) by mouth 2 (two) times daily.  60 tablet  11  . SYNTHROID 75 MCG tablet One by mouth once daily         Current Medications (verified) Current Outpatient Prescriptions  Medication Sig Dispense Refill  . atorvastatin (LIPITOR) 40 MG tablet TAKE 1 TABLET ONCE DAILY  30 tablet  11  . calcium carbonate 200 MG capsule Take 250 mg by mouth 2 (two) times daily with a meal.        . co-enzyme Q-10 30 MG capsule Take 30 mg by mouth daily.        . diazepam (VALIUM) 5 MG tablet Take 5 mg by mouth. 1/2 to 5 mg as needed       . losartan-hydrochlorothiazide (HYZAAR) 100-25 MG per tablet Take 1 tablet by mouth daily.  30 tablet  11  . magnesium 30 MG tablet Take 30 mg by mouth daily.        . metoprolol (LOPRESSOR) 50 MG tablet Take 50 mg by mouth daily.        Marland Kitchen PLAVIX 75 MG tablet One once daily       . RABEprazole (ACIPHEX) 20 MG tablet Take 1 tablet (20 mg total) by mouth 2 (two) times daily.  60 tablet  11  . SYNTHROID 75 MCG tablet One  by mouth once daily       . vitamin D, CHOLECALCIFEROL, 400 UNITS tablet Take 400 Units by mouth daily.           Allergies (verified) Rofecoxib   PAST HISTORY  Family History Family History  Problem Relation Age of Onset  . Colon cancer Brother     at age 18  . Colon cancer Sister     at age 21  . Diabetes Mother   . Diabetes Maternal Aunt   . Diabetes Maternal Uncle     Social History History  Substance Use Topics  . Smoking status: Never Smoker   . Smokeless tobacco: Never Used  . Alcohol Use: No     Are there smokers in your home (other than you)? No  Risk Factors Current exercise habits: Walking 1 hour three times a week and using her treadmill  Dietary issues discussed: none  Cardiac risk factors: advanced age (older than 19 for men, 6 for women), dyslipidemia, hypertension and sedentary lifestyle.  Depression Screen (Note: if  answer to either of the following is "Yes", a more complete depression screening is indicated)   Over the past two weeks, have you felt down, depressed or hopeless? No  Over the past two weeks, have you felt little interest or pleasure in doing things? No  Have you lost interest or pleasure in daily life? No  Do you often feel hopeless? No  Do you cry easily over simple problems? No  Activities of Daily Living In your present state of health, do you have any difficulty performing the following activities?:  Driving? No Managing money?  no Feeding yourself? No Getting from bed to chair? No Climbing a flight of stairs? No Preparing food and eating?: No Bathing or showering? No Getting dressed: No Getting to the toilet? No Using the toilet:No Moving around from place to place: No In the past year have you fallen or had a near fall?:No   Are you sexually active?  Yes  Do you have more than one partner?  No  Hearing Difficulties: No Do you often ask people to speak up or repeat themselves? No Do you experience ringing or noises in your ears? No Do you have difficulty understanding soft or whispered voices? No   Do you feel that you have a problem with memory? No  Do you often misplace items? No  Do you feel safe at home?  No  Cognitive Testing  Alert? Yes  Normal Appearance?Yes  Oriented to person? Yes  Place? Yes   Time? Yes  Recall of three objects?  Yes  Can perform simple calculations? Yes  Displays appropriate judgment?Yes  Can read the correct time from a watch face?Yes   Advanced Directives have been discussed with the patient? Yes  List the Names of Other Physician/Practitioners you currently use: 1.  cardiology  Indicate any recent Medical Services you may have received from other than [facility] providers in the past year (date may be approximate).  Immunization History  Administered Date(s) Administered  . Influenza Whole 05/11/2009  . Pneumococcal Polysaccharide  02/18/2010  . Td 02/18/2010  . Zoster 02/26/2011    Screening Tests Health Maintenance  Topic Date Due  . Zostavax  03/08/1996  . Influenza Vaccine  05/12/2011  . Colonoscopy  03/07/2013  . Tetanus/tdap  02/19/2020  . Pneumococcal Polysaccharide Vaccine Age 36 And Over  Completed    All answers were reviewed with the patient and necessary referrals were made:  [facility]  [place]   02/26/2011   History reviewed: allergies, current medications, past family history, past medical history, past social history, past surgical history and problem list  Review of Systems A comprehensive review of systems was negative except for: Cardiovascular: positive for dyspnea and exertional chest pressure/discomfort    Objective:     Vision by Snellen chart: right eye:20/20, left eye:20/20  Body mass index is 27.07 kg/(m^2). BP 130/80  Pulse 72  Temp 98.2 F (36.8 C)  Resp 14  Ht 5\' 2"  (1.575 m)  Wt 148 lb (67.132 kg)  BMI 27.07 kg/m2  BP 130/80  Pulse 72  Temp 98.2 F (36.8 C)  Resp 14  Ht 5\' 2"  (1.575 m)  Wt 148 lb (67.132 kg)  BMI 27.07 kg/m2  General Appearance:    Alert, cooperative, no distress, appears stated age  Head:    Normocephalic, without obvious abnormality, atraumatic  Eyes:    PERRL, conjunctiva/corneas clear, EOM's intact, fundi    benign, both eyes  Ears:    Normal TM's and external ear canals, both ears  Nose:   Nares normal, septum midline, mucosa normal, no drainage    or sinus tenderness  Throat:   Lips, mucosa, and tongue normal; teeth and gums normal  Neck:   Supple, symmetrical, trachea midline, no adenopathy;    thyroid:  no enlargement/tenderness/nodules; no carotid   bruit or JVD  Back:     Symmetric, no curvature, ROM normal, no CVA tenderness  Lungs:     Clear to auscultation bilaterally, respirations unlabored  Chest Wall:    No tenderness or deformity   Heart:    Regular rate and rhythm, S1 and S2 normal, no murmur, rub   or gallop  Breast Exam:     No tenderness, masses, or nipple abnormality  Abdomen:     Soft, non-tender, bowel sounds active all four quadrants,    no masses, no organomegaly  Genitalia:    Normal female without lesion, discharge or tenderness  Rectal:    Normal tone, normal prostate, no masses or tenderness;   guaiac negative stool  Extremities:   Extremities normal, atraumatic, no cyanosis or edema  Pulses:   2+ and symmetric all extremities  Skin:   Skin color, texture, turgor normal, no rashes or lesions  Lymph nodes:   Cervical, supraclavicular, and axillary nodes normal  Neurologic:   CNII-XII intact, normal strength, sensation and reflexes    throughout       Assessment:      This is a routine physical examination for this healthy  Female. Reviewed all health maintenance protocols including mammography colonoscopy bone density and reviewed appropriate screening labs. Her immunization history was reviewed as well as her current medications and allergies refills of her chronic medications were given and the plan for yearly health maintenance was discussed all orders and referrals were made as appropriate. Blood pressure is well controlled but the shortness of breath and had a history of asthma COPD would indicate the need to change the beta blocker to carvedilol if the symptoms worsen or persist.  This was discussed with the patient appropriate monitoring for control of her lipids liver and thyroid due to her diagnosis of hypothyroidism a urinalysis and a vitamin D level due to her use of calcium for osteoporosis will be drawn today   Plan:     During the course of the visit the patient was educated and counseled about appropriate screening and preventive services including:    Influenza  vaccine  shingles  Diet review for nutrition referral? Yes ____  Not Indicated ___x   Patient Instructions (the written plan) was given to the patient.  Medicare Attestation I have personally reviewed: The  patient's medical and social history Their use of alcohol, tobacco or illicit drugs Their current medications and supplements The patient's functional ability including ADLs,fall risks, home safety risks, cognitive, and hearing and visual impairment Diet and physical activities Evidence for depression or mood disorders  The patient's weight, height, BMI, and visual acuity have been recorded in the chart.  I have made referrals, counseling, and provided education to the patient based on review of the above and I have provided the patient with a written personalized care plan for preventive services.     Carrie Mew   02/26/2011

## 2011-02-26 NOTE — Telephone Encounter (Signed)
Opened in error

## 2011-02-27 LAB — VITAMIN D 25 HYDROXY (VIT D DEFICIENCY, FRACTURES): Vit D, 25-Hydroxy: 40 ng/mL (ref 30–89)

## 2011-03-08 ENCOUNTER — Other Ambulatory Visit: Payer: Self-pay | Admitting: Internal Medicine

## 2011-03-18 NOTE — Progress Notes (Signed)
Pt informed a nd husband also informed of labs

## 2011-04-17 ENCOUNTER — Other Ambulatory Visit: Payer: Self-pay | Admitting: Internal Medicine

## 2011-04-18 NOTE — Telephone Encounter (Signed)
May refill x6 

## 2011-04-18 NOTE — Telephone Encounter (Signed)
rx called into pharmacy

## 2011-05-26 ENCOUNTER — Other Ambulatory Visit: Payer: Self-pay | Admitting: Internal Medicine

## 2011-06-26 ENCOUNTER — Encounter: Payer: Self-pay | Admitting: Gastroenterology

## 2011-06-30 ENCOUNTER — Ambulatory Visit (INDEPENDENT_AMBULATORY_CARE_PROVIDER_SITE_OTHER): Payer: Medicare Other | Admitting: Internal Medicine

## 2011-06-30 ENCOUNTER — Encounter: Payer: Self-pay | Admitting: Internal Medicine

## 2011-06-30 VITALS — BP 126/80 | HR 76 | Temp 98.2°F | Resp 16 | Ht 63.0 in | Wt 146.0 lb

## 2011-06-30 DIAGNOSIS — J441 Chronic obstructive pulmonary disease with (acute) exacerbation: Secondary | ICD-10-CM

## 2011-06-30 DIAGNOSIS — I10 Essential (primary) hypertension: Secondary | ICD-10-CM

## 2011-06-30 DIAGNOSIS — E785 Hyperlipidemia, unspecified: Secondary | ICD-10-CM

## 2011-06-30 DIAGNOSIS — I2510 Atherosclerotic heart disease of native coronary artery without angina pectoris: Secondary | ICD-10-CM

## 2011-06-30 DIAGNOSIS — J45901 Unspecified asthma with (acute) exacerbation: Secondary | ICD-10-CM

## 2011-06-30 LAB — HEPATIC FUNCTION PANEL
ALT: 19 U/L (ref 0–35)
Bilirubin, Direct: 0.1 mg/dL (ref 0.0–0.3)
Total Bilirubin: 1 mg/dL (ref 0.3–1.2)

## 2011-06-30 LAB — LIPID PANEL
HDL: 62.8 mg/dL (ref >39.00)
LDL Cholesterol: 110 mg/dL — ABNORMAL HIGH (ref 0–99)
Total CHOL/HDL Ratio: 3
Triglycerides: 137 mg/dL (ref 0.0–149.0)

## 2011-06-30 LAB — BASIC METABOLIC PANEL
Calcium: 9.2 mg/dL (ref 8.4–10.5)
Creatinine, Ser: 0.9 mg/dL (ref 0.4–1.2)

## 2011-06-30 LAB — TSH: TSH: 0.43 u[IU]/mL (ref 0.35–5.50)

## 2011-06-30 MED ORDER — CARVEDILOL 6.25 MG PO TABS: 6.2500 mg | ORAL_TABLET | Freq: Two times a day (BID) | ORAL | Status: DC

## 2011-06-30 MED ORDER — LOSARTAN POTASSIUM-HCTZ 100-25 MG PO TABS: 0.5000 | ORAL_TABLET | Freq: Every day | ORAL | Status: DC

## 2011-06-30 NOTE — Patient Instructions (Signed)
Due to the flares of asthma we are going to change the metoprolol to carvedilol 6.25 twice daily

## 2011-06-30 NOTE — Progress Notes (Signed)
  Subjective:    Patient ID: Kathleen Thompson, female    DOB: 1934-04-19, 77 y.o.   MRN: [id number]  HPI Patient is a 77 year old white female followed for hypertension hyperlipidemia gastroesophageal reflux she presents today with increased episode of shortness of breath and asthma.  We're concerned that the metoprolol may be complicating her asthma control I discussed a trial of changing her medications   Review of Systems  Constitutional: Negative for activity change, appetite change and fatigue.  HENT: Negative for ear pain, congestion, neck pain, postnasal drip and sinus pressure.   Eyes: Negative for redness and visual disturbance.  Respiratory: Positive for cough and shortness of breath. Negative for wheezing.   Gastrointestinal: Negative for abdominal pain and abdominal distention.  Genitourinary: Negative for dysuria, frequency and menstrual problem.  Musculoskeletal: Negative for myalgias, joint swelling and arthralgias.  Skin: Negative for rash and wound.  Neurological: Negative for dizziness, weakness and headaches.  Hematological: Negative for adenopathy. Does not bruise/bleed easily.  Psychiatric/Behavioral: Negative for sleep disturbance and decreased concentration.       Objective:   Physical Exam  Nursing note and vitals reviewed. Constitutional: She is oriented to person, place, and time. She appears well-developed and well-nourished. No distress.  HENT:  Head: Normocephalic and atraumatic.  Right Ear: External ear normal.  Left Ear: External ear normal.  Nose: Nose normal.  Mouth/Throat: Oropharynx is clear and moist.  Eyes: Conjunctivae and EOM are normal. Pupils are equal, round, and reactive to light.  Neck: Normal range of motion. Neck supple. No JVD present. No tracheal deviation present. No thyromegaly present.  Cardiovascular: Normal rate, regular rhythm, normal heart sounds and intact distal pulses.   No murmur heard. Pulmonary/Chest: Effort normal and  breath sounds normal. She has no wheezes. She exhibits no tenderness.  Abdominal: Soft. Bowel sounds are normal.  Musculoskeletal: Normal range of motion. She exhibits no edema and no tenderness.  Lymphadenopathy:    She has no cervical adenopathy.  Neurological: She is alert and oriented to person, place, and time. She has normal reflexes. No cranial nerve deficit.  Skin: Skin is warm and dry. She is not diaphoretic.  Psychiatric: She has a normal mood and affect. Her behavior is normal.          Assessment & Plan:  Change metoprolol to carvedilol due to asthma Monitor the effectiveness of this intervention. If shortness of breath and asthma improves continue long-term use of carvedilol titrate to control blood pressure adequately

## 2011-07-14 ENCOUNTER — Encounter: Payer: Self-pay | Admitting: Gastroenterology

## 2011-07-14 ENCOUNTER — Ambulatory Visit (INDEPENDENT_AMBULATORY_CARE_PROVIDER_SITE_OTHER): Payer: Medicare Other | Admitting: Gastroenterology

## 2011-07-14 VITALS — BP 124/68 | HR 60 | Ht 62.0 in | Wt 154.0 lb

## 2011-07-14 DIAGNOSIS — R142 Eructation: Secondary | ICD-10-CM

## 2011-07-14 DIAGNOSIS — K219 Gastro-esophageal reflux disease without esophagitis: Secondary | ICD-10-CM

## 2011-07-14 DIAGNOSIS — Z86010 Personal history of colonic polyps: Secondary | ICD-10-CM

## 2011-07-14 DIAGNOSIS — R143 Flatulence: Secondary | ICD-10-CM

## 2011-07-14 MED ORDER — PEG-KCL-NACL-NASULF-NA ASC-C 100 G PO SOLR: 1.0000 | Freq: Once | ORAL | Status: DC

## 2011-07-14 MED ORDER — ALIGN 4 MG PO CAPS: 1.0000 | ORAL_CAPSULE | Freq: Every day | ORAL | Status: DC

## 2011-07-14 NOTE — Progress Notes (Signed)
History of Present Illness: This is a 77 year old female with a history of GERD and gastroparesis who relates worsening problems with postprandial bloating for the past 2-3 months. Her bloating tends to increase throughout the day. Her reflux symptoms are well-controlled on AcipHex 20 mg twice daily. Denies weight loss, abdominal pain, constipation, diarrhea, change in stool caliber, melena, hematochezia, nausea, vomiting, dysphagia, reflux symptoms, chest pain.  Current Medications, Allergies, Past Medical History, Past Surgical History, Family History and Social History were reviewed in [employer] record.  Physical Exam: General: Well developed , well nourished, no acute distress Head: Normocephalic and atraumatic Eyes:  sclerae anicteric, EOMI Ears: Normal auditory acuity Mouth: No deformity or lesions Lungs: Clear throughout to auscultation Heart: Regular rate and rhythm; no murmurs, rubs or bruits Abdomen: Soft, non tender and non distended. No masses, hepatosplenomegaly or hernias noted. Normal Bowel sounds Rectal: Deferred to colonoscopy Musculoskeletal: Symmetrical with no gross deformities  Pulses:  Normal pulses noted Extremities: No clubbing, cyanosis, edema or deformities noted Neurological: Alert oriented x 4, grossly nonfocal Psychological:  Alert and cooperative. Normal mood and affect  Assessment and Recommendations:  1. GERD. Continue AcipHex twice daily and standard antireflux measures.   2. History of adenomatous colon polyps, initially diagnosed in 2004. She is due for surveillance colonoscopy. The risks, benefits, and alternatives to colonoscopy with possible biopsy and possible polypectomy were discussed with the patient and they consent to proceed. The risks, benefits, and alternatives to 5-7 day hold Plavix were discussed with the patient and she consents to proceed. We'll obtain clearance for her prescribing physician.   3. Brother with colon  cancer at age 61. Colonoscopy as above.  4. Bloating. Likely related to gastroparesis and intestinal gas. Trial of a low-fat diet, a probiotic and Gas-X 4 times a day when necessary. Consider promotility agent if her symptoms do not improve.

## 2011-07-14 NOTE — Patient Instructions (Signed)
You have been scheduled for a Colonoscopy. See separate instructions. Pick up your prep kit from your pharmacy.  You will be contacted by our office prior to your procedure for directions on holding your Plavix.  If you do not hear from our office 1 week prior to your scheduled procedure, please call [phone] to discuss.   Start taking Align samples one tablet by mouth once daily x 1 month and use Gas-X four times a day as needed for gas and bloating.   cc: Darryll Capers, MD

## 2011-08-14 ENCOUNTER — Other Ambulatory Visit: Payer: Self-pay | Admitting: Internal Medicine

## 2011-08-15 ENCOUNTER — Telehealth: Payer: Self-pay | Admitting: Gastroenterology

## 2011-08-15 NOTE — Telephone Encounter (Signed)
Informed patient that I sent a letter in early December and have not heard yet from Dr. Lovell Sheehan if this is ok for her to hold Plavix. Told her that I will contact here office to get a answer today or we will have to reschedule her procedure. Patient states she has not taken her Plavix today. I told her not to stop taking her Plavix until we hear from Dr. York Ram office. Called Dr. York Ram office and spoke to [place] and she states she will hand deliver the message regarding this patient's Plavix to [place].

## 2011-08-15 NOTE — Telephone Encounter (Signed)
Kathleen Thompson from Dr. York Ram office states she can come off 3 days prior to her procedure per Dr. York Ram. Notified patient and she verbalized understanding.

## 2011-08-21 ENCOUNTER — Encounter: Payer: Self-pay | Admitting: Gastroenterology

## 2011-08-21 ENCOUNTER — Ambulatory Visit (AMBULATORY_SURGERY_CENTER): Payer: Medicare Other | Admitting: Gastroenterology

## 2011-08-21 DIAGNOSIS — Z800 Family history of malignant neoplasm of digestive organs: Secondary | ICD-10-CM

## 2011-08-21 DIAGNOSIS — Z86010 Personal history of colonic polyps: Secondary | ICD-10-CM

## 2011-08-21 DIAGNOSIS — Z1211 Encounter for screening for malignant neoplasm of colon: Secondary | ICD-10-CM

## 2011-08-21 MED ORDER — SODIUM CHLORIDE 0.9 % IV SOLN: 500.0000 mL | INTRAVENOUS | Status: DC

## 2011-08-21 NOTE — Progress Notes (Signed)
Patient did not experience any of the following events: a burn prior to discharge; a fall within the facility; wrong site/side/patient/procedure/implant event; or a hospital transfer or hospital admission upon discharge from the facility. (G8907) Patient did not have preoperative order for IV antibiotic SSI prophylaxis. (G8918)  

## 2011-08-21 NOTE — Patient Instructions (Signed)
mAY RESUME PLAVIX PER DR. STARKS ORDER.

## 2011-08-21 NOTE — Op Note (Signed)
 [facility] [address] [place], [place]  [address]  COLONOSCOPY PROCEDURE REPORT  PATIENT:  Kathleen, Thompson  MR#:  [id number] BIRTHDATE:  [birth year], 77 yrs. old  GENDER:  female ENDOSCOPIST:  Judie Petit T. Russella Dar, MD, [facility]  PROCEDURE DATE:  08/21/2011 PROCEDURE:  Higher-risk screening colonoscopy G0105 ASA CLASS:  Class II INDICATIONS:  1) surveillance and high-risk screening  2) family history of colon cancer  3) history of pre-cancerous (adenomatous) colon polyps: 02/2003. MEDICATIONS:   These medications were titrated to patient response per physician's verbal order, Fentanyl 100 mcg IV, Versed 10 mg IV DESCRIPTION OF PROCEDURE:   After the risks benefits and alternatives of the procedure were thoroughly explained, informed consent was obtained.  Digital rectal exam was performed and revealed no abnormalities.   The LB [id number] endoscope was introduced through the anus and advanced to the cecum, which was identified by both the appendix and ileocecal valve, without limitations.  The quality of the prep was good, using MoviPrep. The instrument was then slowly withdrawn as the colon was fully examined. <<PROCEDUREIMAGES>> FINDINGS:  Mild diverticulosis was found in the sigmoid colon. Otherwise normal colonoscopy without other polyps, masses, vascular ectasias, or inflammatory changes.  Retroflexed views in the rectum revealed no abnormalities.  The time to cecum =  3.5 minutes. The scope was then withdrawn (time =  8.25  min) from the patient and the procedure completed.  COMPLICATIONS:  None  ENDOSCOPIC IMPRESSION: 1) Mild diverticulosis in the sigmoid colon  RECOMMENDATIONS: 1) High fiber diet with liberal fluid intake. 2) Repeat Colonoscopy in 5 years. 3) Consider MAC sedation for future procedures  Kathleen Thompson T. Russella Dar, MD, [facility]  n. eSIGNED:   Venita Lick. Ayat Drenning at 08/21/2011 11:31 AM  Tacy Dura, [phone]

## 2011-08-22 ENCOUNTER — Telehealth: Payer: Self-pay | Admitting: *Deleted

## 2011-08-22 NOTE — Telephone Encounter (Signed)

## 2011-09-10 ENCOUNTER — Ambulatory Visit: Payer: Medicare Other | Admitting: Internal Medicine

## 2011-09-22 ENCOUNTER — Ambulatory Visit (INDEPENDENT_AMBULATORY_CARE_PROVIDER_SITE_OTHER): Payer: Medicare Other | Admitting: Internal Medicine

## 2011-09-22 ENCOUNTER — Encounter: Payer: Self-pay | Admitting: Internal Medicine

## 2011-09-22 VITALS — BP 132/80 | HR 72 | Temp 98.6°F | Resp 16 | Ht 62.0 in | Wt 152.0 lb

## 2011-09-22 DIAGNOSIS — I2510 Atherosclerotic heart disease of native coronary artery without angina pectoris: Secondary | ICD-10-CM

## 2011-09-22 DIAGNOSIS — J019 Acute sinusitis, unspecified: Secondary | ICD-10-CM

## 2011-09-22 DIAGNOSIS — I10 Essential (primary) hypertension: Secondary | ICD-10-CM

## 2011-09-22 MED ORDER — ATORVASTATIN CALCIUM 40 MG PO TABS: 40.0000 mg | ORAL_TABLET | Freq: Every day | ORAL | Status: DC

## 2011-09-22 MED ORDER — LEVOTHYROXINE SODIUM 75 MCG PO TABS: 75.0000 ug | ORAL_TABLET | Freq: Every day | ORAL | Status: DC

## 2011-09-22 MED ORDER — CARVEDILOL 6.25 MG PO TABS: 6.2500 mg | ORAL_TABLET | Freq: Two times a day (BID) | ORAL | Status: DC

## 2011-09-22 MED ORDER — OMEPRAZOLE 40 MG PO CPDR: 40.0000 mg | DELAYED_RELEASE_CAPSULE | Freq: Two times a day (BID) | ORAL | Status: DC

## 2011-09-22 MED ORDER — LOSARTAN POTASSIUM-HCTZ 100-25 MG PO TABS: 0.5000 | ORAL_TABLET | Freq: Every day | ORAL | Status: DC

## 2011-09-22 MED ORDER — DOXYCYCLINE HYCLATE 100 MG PO TABS: 100.0000 mg | ORAL_TABLET | Freq: Two times a day (BID) | ORAL | Status: AC

## 2011-09-22 MED ORDER — CLOPIDOGREL BISULFATE 75 MG PO TABS: 75.0000 mg | ORAL_TABLET | Freq: Every day | ORAL | Status: DC

## 2011-09-22 MED ORDER — TRAMADOL HCL 50 MG PO TABS: 50.0000 mg | ORAL_TABLET | Freq: Four times a day (QID) | ORAL | Status: DC | PRN

## 2011-09-22 NOTE — Patient Instructions (Signed)
The patient is instructed to continue all medications as prescribed. Schedule followup with check out clerk upon leaving the clinic  

## 2011-09-22 NOTE — Progress Notes (Signed)
  Subjective:    Patient ID: Kathleen Thompson, female    DOB: 1934-01-12, 78 y.o.   MRN: [id number]  HPI patient has upper respiratory tract infection with symptoms of congestion cough and runny nose with a headache and sore throat for about 5-7 days duration.  She is followed for hyperlipidemia her lipid has improved on niacin for hypertension which has been stable and for gastroesophageal reflux which he no longer can afford the AcipHex that she has been taking she has discussed this with her gastroenterologist and he said he would defer to me to change that to a more affordable medication    Review of Systems  Constitutional: Negative for activity change, appetite change and fatigue.  HENT: Positive for congestion, rhinorrhea and postnasal drip. Negative for ear pain, neck pain and sinus pressure.   Eyes: Negative for redness and visual disturbance.  Respiratory: Positive for cough and shortness of breath. Negative for wheezing.   Gastrointestinal: Negative for abdominal pain and abdominal distention.  Genitourinary: Negative for dysuria, frequency and menstrual problem.  Musculoskeletal: Negative for myalgias, joint swelling and arthralgias.  Skin: Negative for rash and wound.  Neurological: Negative for dizziness, weakness and headaches.  Hematological: Negative for adenopathy. Does not bruise/bleed easily.  Psychiatric/Behavioral: Negative for sleep disturbance and decreased concentration.       Objective:   Physical Exam  Nursing note and vitals reviewed. Constitutional: She is oriented to person, place, and time. She appears well-developed and well-nourished. No distress.  HENT:  Head: Normocephalic and atraumatic.  Right Ear: External ear normal.  Left Ear: External ear normal.  Eyes: Conjunctivae and EOM are normal. Pupils are equal, round, and reactive to light.  Neck: Normal range of motion. Neck supple. No JVD present. No tracheal deviation present. No thyromegaly present.    Cardiovascular: Normal rate, regular rhythm, normal heart sounds and intact distal pulses.   No murmur heard. Pulmonary/Chest: Effort normal and breath sounds normal. She has no wheezes. She exhibits tenderness.  Abdominal: Soft. Bowel sounds are normal.  Musculoskeletal: Normal range of motion. She exhibits no edema and no tenderness.  Lymphadenopathy:    She has no cervical adenopathy.  Neurological: She is alert and oriented to person, place, and time. She has normal reflexes. No cranial nerve deficit.  Skin: Skin is warm and dry. She is not diaphoretic.  Psychiatric: She has a normal mood and affect. Her behavior is normal.          Assessment & Plan:  The patient has upper respiratory tract infection with a history of multiple complications I think it is prudent to treat this with a doxycycline course.  She has hypothyroidism with a stable TSH in stable replacement.  She is concerned about her recent weight gain but review of her chart shows her weight is fluctuated around 5 pounds over the past 2 years we talked about the relationship between activity and eating foods that are more metabolically stimulating that simple carbohydrate such as whole brain with and treat fruits.  We will change her AcipHex to Prilosec 40 mg by mouth twice a day

## 2011-09-26 ENCOUNTER — Ambulatory Visit: Payer: Medicare Other | Admitting: Internal Medicine

## 2011-10-06 ENCOUNTER — Other Ambulatory Visit: Payer: Self-pay | Admitting: *Deleted

## 2011-10-06 NOTE — Telephone Encounter (Signed)
Patient is requesting a refill for Ciclopirox 8% solution if possible

## 2011-11-04 MED ORDER — CICLOPIROX 8 % EX SOLN: Freq: Every day | CUTANEOUS | Status: DC

## 2011-11-04 NOTE — Telephone Encounter (Signed)
rx sent in electronically 

## 2011-11-04 NOTE — Telephone Encounter (Signed)
May refill 

## 2011-11-11 ENCOUNTER — Other Ambulatory Visit: Payer: Self-pay | Admitting: *Deleted

## 2011-11-11 MED ORDER — DIAZEPAM 5 MG PO TABS: ORAL_TABLET | ORAL | Status: DC

## 2011-12-10 ENCOUNTER — Ambulatory Visit: Payer: Medicare Other | Admitting: Internal Medicine

## 2011-12-18 ENCOUNTER — Telehealth: Payer: Self-pay | Admitting: Internal Medicine

## 2011-12-18 NOTE — Telephone Encounter (Signed)
Pt still having feet swelling. Pt decline to see any provider. Please advise

## 2011-12-19 ENCOUNTER — Ambulatory Visit (INDEPENDENT_AMBULATORY_CARE_PROVIDER_SITE_OTHER)
Admission: RE | Admit: 2011-12-19 | Discharge: 2011-12-19 | Disposition: A | Discharge status: Home / Self Care | Payer: Medicare Other | Source: Ambulatory Visit | Attending: Family | Admitting: Family

## 2011-12-19 ENCOUNTER — Ambulatory Visit (INDEPENDENT_AMBULATORY_CARE_PROVIDER_SITE_OTHER): Payer: Medicare Other | Admitting: Family

## 2011-12-19 ENCOUNTER — Encounter: Payer: Self-pay | Admitting: Family

## 2011-12-19 VITALS — BP 120/80 | Temp 98.6°F | Wt 156.0 lb

## 2011-12-19 DIAGNOSIS — R609 Edema, unspecified: Secondary | ICD-10-CM

## 2011-12-19 DIAGNOSIS — E039 Hypothyroidism, unspecified: Secondary | ICD-10-CM

## 2011-12-19 DIAGNOSIS — J449 Chronic obstructive pulmonary disease, unspecified: Secondary | ICD-10-CM

## 2011-12-19 LAB — POCT URINALYSIS DIPSTICK
Bilirubin, UA: NEGATIVE
Glucose, UA: NEGATIVE
Ketones, UA: NEGATIVE
Nitrite, UA: NEGATIVE

## 2011-12-19 LAB — TSH: TSH: 0.88 u[IU]/mL (ref 0.35–5.50)

## 2011-12-19 LAB — BASIC METABOLIC PANEL
Chloride: 103 mEq/L (ref 96–112)
GFR: 60.82 mL/min (ref >60.00)
Potassium: 4.5 mEq/L (ref 3.5–5.1)
Sodium: 139 mEq/L (ref 135–145)

## 2011-12-19 MED ORDER — FUROSEMIDE 20 MG PO TABS: 20.0000 mg | ORAL_TABLET | Freq: Every day | ORAL | Status: DC

## 2011-12-19 NOTE — Patient Instructions (Signed)
Peripheral Edema You have swelling in your legs (peripheral edema). This swelling is due to excess accumulation of salt and water in your body. Edema may be a sign of heart, kidney or liver disease, or a side effect of a medication. It may also be due to problems in the leg veins. Elevating your legs and using special support stockings may be very helpful, if the cause of the swelling is due to poor venous circulation. Avoid long periods of standing, whatever the cause. Treatment of edema depends on identifying the cause. Chips, pretzels, pickles and other salty foods should be avoided. Restricting salt in your diet is almost always needed. Water pills (diuretics) are often used to remove the excess salt and water from your body via urine. These medicines prevent the kidney from reabsorbing sodium. This increases urine flow. Diuretic treatment may also result in lowering of potassium levels in your body. Potassium supplements may be needed if you have to use diuretics daily. Daily weights can help you keep track of your progress in clearing your edema. You should call your caregiver for follow up care as recommended. SEEK IMMEDIATE MEDICAL CARE IF:   You have increased swelling, pain, redness, or heat in your legs.   You develop shortness of breath, especially when lying down.   You develop chest or abdominal pain, weakness, or fainting.   You have a fever.  Document Released: 09/04/2004 Document Revised: 07/17/2011 Document Reviewed: 08/15/2009 [employer] Patient Information 2012 [employer]. 

## 2011-12-19 NOTE — Progress Notes (Signed)
Subjective:    Patient ID: Kathleen Thompson, female    DOB: [birth year], 78 y.o.   MRN: [id number]  HPI 78 year old white female, patient of Dr. Lovell Sheehan is in today with complaints of bilateral peripheral edema times one month. Her edema is worse on the left than the right. She has a history of COPD that's been well controlled without medication and diagnosed as mild. Reports have a shortness of breath: [place] but otherwise has no shortness of breath. She does not currently use any medication for COPD. She also has a history of hypothyroidism currently stable on her medications. She has not had her thyroid checked in about 7 months. Reports doing a lot of traveling recently car, but keeps her feet elevated on the dashboard. Denies any pain in her legs or calves, no pain with walking. She is a nonsmoker, denies any lightheadedness, dizziness, chest pain, palpitations, shortness of breath or edema.   Review of Systems  Constitutional: Negative.   Respiratory:       Mild shortness of breath climbing hills  Cardiovascular: Positive for leg swelling. Negative for chest pain and palpitations.  Genitourinary: Negative for dysuria, urgency and frequency.  Musculoskeletal: Negative.   Hematological: Negative.   Psychiatric/Behavioral: Negative.    Past Medical History  Diagnosis Date  . Internal hemorrhoid   . Diverticulosis   . Gastroparesis   . GERD (gastroesophageal reflux disease)   . Adenomatous colon polyp 02/2003  . Cellulitis and abscess of hand   . Hyperlipidemia   . PVD (peripheral vascular disease)   . Hypothyroidism   . Hypertension   . CAD (coronary artery disease)   . Iron deficiency anemia   . Esophageal stricture   . Arthritis   . Blood transfusion   . Cataract   . Osteoporosis     History   Social History  . Marital Status: Married    Spouse Name: N/A    Number of Children: N/A  . Years of Education: N/A   Occupational History  . Retired    Social History Main  Topics  . Smoking status: Never Smoker   . Smokeless tobacco: Never Used  . Alcohol Use: No  . Drug Use: No  . Sexually Active: Yes   Other Topics Concern  . Not on file   Social History Narrative  . No narrative on file    Past Surgical History  Procedure Date  . Appendectomy   . Abdominal hysterectomy   . Cervical fusion   . Shoulder surgery      Family History  Problem Relation Age of Onset  . Colon cancer Brother     at age 82  . Colon cancer Sister     at age 4  . Diabetes Mother   . Diabetes Maternal Aunt   . Diabetes Maternal Uncle     Allergies  Allergen Reactions  . Rofecoxib   . Latex Rash    Pt states that latex causes blisters to skin    Current Outpatient Prescriptions on File Prior to Visit  Medication Sig Dispense Refill  . atorvastatin (LIPITOR) 40 MG tablet Take 1 tablet (40 mg total) by mouth daily.  90 tablet  3  . calcium carbonate 200 MG capsule Take 250 mg by mouth 2 (two) times daily with a meal.        . carvedilol (COREG) 6.25 MG tablet Take 1 tablet (6.25 mg total) by mouth 2 (two) times daily.  180 tablet  3  . ciclopirox (  PENLAC) 8 % solution Apply topically at bedtime. Apply over nail and surrounding skin. Apply daily over previous coat. After seven (7) days, may remove with alcohol and continue cycle.  6.6 mL  0  . clopidogrel (PLAVIX) 75 MG tablet Take 1 tablet (75 mg total) by mouth daily.  90 tablet  3  . co-enzyme Q-10 30 MG capsule Take 30 mg by mouth daily.        . diazepam (VALIUM) 5 MG tablet 1/4 tab qd prn  30 tablet  5  . levothyroxine (SYNTHROID, LEVOTHROID) 75 MCG tablet Take 1 tablet (75 mcg total) by mouth daily.  90 tablet  3  . losartan-hydrochlorothiazide (HYZAAR) 100-25 MG per tablet Take 0.5 tablets by mouth daily.  45 tablet  3  . magnesium 30 MG tablet Take 30 mg by mouth daily.        Marland Kitchen omeprazole (PRILOSEC) 40 MG capsule Take 1 capsule (40 mg total) by mouth 2 (two) times daily.  180 capsule  3  . Probiotic  Product (ALIGN) 4 MG CAPS Take 1 capsule by mouth daily.  30 capsule  0  . traMADol (ULTRAM) 50 MG tablet Take 1 tablet (50 mg total) by mouth every 6 (six) hours as needed for pain.  180 tablet  2  . vitamin D, CHOLECALCIFEROL, 400 UNITS tablet Take 400 Units by mouth daily.        Marland Kitchen zoster vaccine live, PF, (ZOSTAVAX) 16109 UNT/0.65ML injection Inject 19,400 Units into the skin once.  1 vial  0  . furosemide (LASIX) 20 MG tablet Take 1 tablet (20 mg total) by mouth daily.  30 tablet  3    BP 120/80  Temp(Src) 98.6 F (37 C) (Oral)  Wt 156 lb (70.761 kg)chart    Objective:   Physical Exam  Constitutional: She is oriented to person, place, and time. She appears well-developed and well-nourished.  HENT:  Right Ear: External ear normal.  Left Ear: External ear normal.  Nose: Nose normal.  Mouth/Throat: Oropharynx is clear and moist.  Neck: Normal range of motion. Neck supple. No thyromegaly present.  Cardiovascular: Regular rhythm and intact distal pulses.  Exam reveals no gallop and no friction rub.   No murmur heard. Pulmonary/Chest: Effort normal and breath sounds normal.  Abdominal: Soft. Bowel sounds are normal.  Musculoskeletal: She exhibits edema.       2+ peripheral edema noted bilaterally to ankles and feet.  Neurological: She is alert and oriented to person, place, and time.  Skin: Skin is warm and dry.  Psychiatric: She has a normal mood and affect.          Assessment & Plan:  Assessment: COPD-mild, peripheral edema, shortness of breath  Plan: I believe her peripheral edema is related to sodium retention versus worsening lung disease. Lab sent to include TSH and BMP will notify patient pending results. Lasix 20 mg once a day given. We'll follow up patient pending the results of her labs. Chest x-ray ordered. We'll consider pulmonary referral if necessary or the initiation of a long-acting beta agonist. Patient to call the office if her symptoms worsen or persist.  Recheck a schedule, when necessary.

## 2011-12-19 NOTE — Telephone Encounter (Signed)
Called back with bp of 91/61 and pulse 88-- suggested she see padonda-appointment this afternoon

## 2011-12-26 ENCOUNTER — Encounter: Payer: Self-pay | Admitting: Internal Medicine

## 2011-12-26 ENCOUNTER — Ambulatory Visit (INDEPENDENT_AMBULATORY_CARE_PROVIDER_SITE_OTHER): Payer: Medicare Other | Admitting: Internal Medicine

## 2011-12-26 VITALS — BP 128/78 | HR 72 | Temp 98.2°F | Resp 16 | Ht 63.0 in | Wt 154.0 lb

## 2011-12-26 DIAGNOSIS — J449 Chronic obstructive pulmonary disease, unspecified: Secondary | ICD-10-CM

## 2011-12-26 DIAGNOSIS — I10 Essential (primary) hypertension: Secondary | ICD-10-CM

## 2011-12-26 DIAGNOSIS — I2510 Atherosclerotic heart disease of native coronary artery without angina pectoris: Secondary | ICD-10-CM

## 2011-12-26 DIAGNOSIS — E785 Hyperlipidemia, unspecified: Secondary | ICD-10-CM

## 2011-12-26 LAB — BASIC METABOLIC PANEL
BUN: 9 mg/dL (ref 6–23)
Chloride: 100 mEq/L (ref 96–112)
GFR: 63.12 mL/min (ref >60.00)
Potassium: 4.3 mEq/L (ref 3.5–5.1)

## 2011-12-26 MED ORDER — OLMESARTAN MEDOXOMIL 20 MG PO TABS: 20.0000 mg | ORAL_TABLET | Freq: Every day | ORAL | Status: DC

## 2011-12-26 NOTE — Patient Instructions (Signed)
We're going to stop the Hyzaar and replace it with 20 mg of Benicar you will stay on the furosemide one a day we are going to look at your potassium and electrolytes today to make sure the Lasix is not causing [place] any issues if the electrolytes are fine in the combination of the Benicar and Lasix will do well for your blood pressure and control the fluid in her ankles

## 2011-12-26 NOTE — Progress Notes (Signed)
Subjective:    Patient ID: Kathleen Thompson, female    DOB: Jan 05, 1934, 78 y.o.   MRN: [id number]  HPI Patient is a pleasant 78 year old female who presents after a visit with our extender about increasing lower extremity edema.  She has had recent changes in her hypertensive therapy to a generic ARB and she developed progressive edema in her lower extremities despite attempts to elevate her legs.  She was placed on very low dose Lasix at 20 mg and the edema has significantly improved we will further titrate her blood pressure medication by discontinuing the Hyzaar and replacing it with 20 mg of Benicar and continuing the furosemide.     Review of Systems  Constitutional: Negative for activity change, appetite change and fatigue.  HENT: Negative for ear pain, congestion, neck pain, postnasal drip and sinus pressure.   Eyes: Negative for redness and visual disturbance.  Respiratory: Negative for cough, shortness of breath and wheezing.   Gastrointestinal: Negative for abdominal pain and abdominal distention.  Genitourinary: Negative for dysuria, frequency and menstrual problem.  Musculoskeletal: Negative for myalgias, joint swelling and arthralgias.  Skin: Negative for rash and wound.  Neurological: Negative for dizziness, weakness and headaches.  Hematological: Negative for adenopathy. Does not bruise/bleed easily.  Psychiatric/Behavioral: Negative for sleep disturbance and decreased concentration.   Past Medical History  Diagnosis Date  . Internal hemorrhoid   . Diverticulosis   . Gastroparesis   . GERD (gastroesophageal reflux disease)   . Adenomatous colon polyp 02/2003  . Cellulitis and abscess of hand   . Hyperlipidemia   . PVD (peripheral vascular disease)   . Hypothyroidism   . Hypertension   . CAD (coronary artery disease)   . Iron deficiency anemia   . Esophageal stricture   . Arthritis   . Blood transfusion   . Cataract   . Osteoporosis     History   Social  History  . Marital Status: Married    Spouse Name: N/A    Number of Children: N/A  . Years of Education: N/A   Occupational History  . Retired    Social History Main Topics  . Smoking status: Never Smoker   . Smokeless tobacco: Never Used  . Alcohol Use: No  . Drug Use: No  . Sexually Active: Yes   Other Topics Concern  . Not on file   Social History Narrative  . No narrative on file    Past Surgical History  Procedure Date  . Appendectomy   . Abdominal hysterectomy   . Cervical fusion   . Shoulder surgery      Family History  Problem Relation Age of Onset  . Colon cancer Brother     at age 5  . Colon cancer Sister     at age 41  . Diabetes Mother   . Diabetes Maternal Aunt   . Diabetes Maternal Uncle     Allergies  Allergen Reactions  . Rofecoxib   . Latex Rash    Pt states that latex causes blisters to skin    Current Outpatient Prescriptions on File Prior to Visit  Medication Sig Dispense Refill  . atorvastatin (LIPITOR) 40 MG tablet Take 1 tablet (40 mg total) by mouth daily.  90 tablet  3  . calcium carbonate 200 MG capsule Take 250 mg by mouth 2 (two) times daily with a meal.        . carvedilol (COREG) 6.25 MG tablet Take 1 tablet (6.25 mg total) by mouth 2 (  two) times daily.  180 tablet  3  . ciclopirox (PENLAC) 8 % solution Apply topically at bedtime. Apply over nail and surrounding skin. Apply daily over previous coat. After seven (7) days, may remove with alcohol and continue cycle.  6.6 mL  0  . clopidogrel (PLAVIX) 75 MG tablet Take 1 tablet (75 mg total) by mouth daily.  90 tablet  3  . co-enzyme Q-10 30 MG capsule Take 10 mg by mouth daily.       . diazepam (VALIUM) 5 MG tablet 1/4 tab qd prn  30 tablet  5  . furosemide (LASIX) 20 MG tablet Take 1 tablet (20 mg total) by mouth daily.  30 tablet  3  . levothyroxine (SYNTHROID, LEVOTHROID) 75 MCG tablet Take 1 tablet (75 mcg total) by mouth daily.  90 tablet  3  . magnesium 30 MG tablet Take  30 mg by mouth daily.        Marland Kitchen olmesartan (BENICAR) 20 MG tablet Take 1 tablet (20 mg total) by mouth daily.  30 tablet  11  . Probiotic Product (ALIGN) 4 MG CAPS Take 1 capsule by mouth daily.  30 capsule  0  . traMADol (ULTRAM) 50 MG tablet Take 1 tablet (50 mg total) by mouth every 6 (six) hours as needed for pain.  180 tablet  2  . vitamin D, CHOLECALCIFEROL, 400 UNITS tablet Take 400 Units by mouth daily.        Marland Kitchen zoster vaccine live, PF, (ZOSTAVAX) 16109 UNT/0.65ML injection Inject 19,400 Units into the skin once.  1 vial  0    BP 128/78  Pulse 72  Temp 98.2 F (36.8 C)  Resp 16  Ht 5\' 3"  (1.6 m)  Wt 154 lb (69.854 kg)  BMI 27.28 kg/m2       Objective:   Physical Exam  Constitutional: She is oriented to person, place, and time. She appears well-developed and well-nourished. No distress.  HENT:  Head: Normocephalic and atraumatic.  Right Ear: External ear normal.  Left Ear: External ear normal.  Nose: Nose normal.  Mouth/Throat: Oropharynx is clear and moist.  Eyes: Conjunctivae and EOM are normal. Pupils are equal, round, and reactive to light.  Neck: Normal range of motion. Neck supple. No JVD present. No tracheal deviation present. No thyromegaly present.  Cardiovascular: Normal rate, regular rhythm, normal heart sounds and intact distal pulses.   No murmur heard. Pulmonary/Chest: Effort normal and breath sounds normal. She has no wheezes. She exhibits no tenderness.  Abdominal: Soft. Bowel sounds are normal.  Musculoskeletal: Normal range of motion. She exhibits no edema and no tenderness.  Lymphadenopathy:    She has no cervical adenopathy.  Neurological: She is alert and oriented to person, place, and time. She has normal reflexes. No cranial nerve deficit.  Skin: Skin is warm and dry. She is not diaphoretic.  Psychiatric: She has a normal mood and affect. Her behavior is normal.          Assessment & Plan:  I would like to follow her back in 3 months we  will get a basic metabolic panel today I've given her samples of Benicar the last 20 mg with the Lasix.  She has a history of attention hyperlipidemia CAD all of these are stable a TSH was monitored and the TSH showed stability in her replacement regimen

## 2012-03-29 ENCOUNTER — Ambulatory Visit (INDEPENDENT_AMBULATORY_CARE_PROVIDER_SITE_OTHER): Payer: Medicare Other | Admitting: Internal Medicine

## 2012-03-29 ENCOUNTER — Encounter: Payer: Self-pay | Admitting: Internal Medicine

## 2012-03-29 VITALS — BP 140/80 | HR 76 | Temp 98.2°F | Resp 16 | Ht 63.0 in | Wt 148.0 lb

## 2012-03-29 DIAGNOSIS — I10 Essential (primary) hypertension: Secondary | ICD-10-CM

## 2012-03-29 DIAGNOSIS — J449 Chronic obstructive pulmonary disease, unspecified: Secondary | ICD-10-CM

## 2012-03-29 DIAGNOSIS — E039 Hypothyroidism, unspecified: Secondary | ICD-10-CM

## 2012-03-29 DIAGNOSIS — W1800XA Striking against unspecified object with subsequent fall, initial encounter: Secondary | ICD-10-CM

## 2012-03-29 DIAGNOSIS — I2510 Atherosclerotic heart disease of native coronary artery without angina pectoris: Secondary | ICD-10-CM

## 2012-03-29 DIAGNOSIS — R55 Syncope and collapse: Secondary | ICD-10-CM

## 2012-03-29 DIAGNOSIS — W1809XA Striking against other object with subsequent fall, initial encounter: Secondary | ICD-10-CM

## 2012-03-29 LAB — BASIC METABOLIC PANEL
Calcium: 9.4 mg/dL (ref 8.4–10.5)
GFR: 92.53 mL/min (ref >60.00)
Sodium: 138 mEq/L (ref 135–145)

## 2012-03-29 MED ORDER — TRAMADOL HCL 50 MG PO TABS: 50.0000 mg | ORAL_TABLET | Freq: Four times a day (QID) | ORAL | Status: DC | PRN

## 2012-03-29 NOTE — Progress Notes (Signed)
Subjective:    Patient ID: Kathleen Thompson, female    DOB: March 07, 1934, 78 y.o.   MRN: [id number]  HPI The patient states that she could not tolerate the benicar. Her symptoms were increased respiratory distress and lightheadedness and she had a syncopal episode.  She uses the Lasix no more than 2 times a week for peripheral edema she resumed the losartan hydrochlorothiazide for blood pressure control.     Review of Systems  Constitutional: Negative for activity change, appetite change and fatigue.  HENT: Negative for ear pain, congestion, neck pain, postnasal drip and sinus pressure.   Eyes: Negative for redness and visual disturbance.  Respiratory: Negative for cough, shortness of breath and wheezing.   Gastrointestinal: Negative for abdominal pain and abdominal distention.  Genitourinary: Negative for dysuria, frequency and menstrual problem.  Musculoskeletal: Negative for myalgias, joint swelling and arthralgias.  Skin: Negative for rash and wound.  Neurological: Negative for dizziness, weakness and headaches.  Hematological: Negative for adenopathy. Does not bruise/bleed easily.  Psychiatric/Behavioral: Negative for disturbed wake/sleep cycle and decreased concentration.   Past Medical History  Diagnosis Date  . Internal hemorrhoid   . Diverticulosis   . Gastroparesis   . GERD (gastroesophageal reflux disease)   . Adenomatous colon polyp 02/2003  . Cellulitis and abscess of hand   . Hyperlipidemia   . PVD (peripheral vascular disease)   . Hypothyroidism   . Hypertension   . CAD (coronary artery disease)   . Iron deficiency anemia   . Esophageal stricture   . Arthritis   . Blood transfusion   . Cataract   . Osteoporosis     History   Social History  . Marital Status: Married    Spouse Name: N/A    Number of Children: N/A  . Years of Education: N/A   Occupational History  . Retired    Social History Main Topics  . Smoking status: Never Smoker   . Smokeless  tobacco: Never Used  . Alcohol Use: No  . Drug Use: No  . Sexually Active: Yes   Other Topics Concern  . Not on file   Social History Narrative  . No narrative on file    Past Surgical History  Procedure Date  . Appendectomy   . Abdominal hysterectomy   . Cervical fusion   . Shoulder surgery      Family History  Problem Relation Age of Onset  . Colon cancer Brother     at age 14  . Colon cancer Sister     at age 78  . Diabetes Mother   . Diabetes Maternal Aunt   . Diabetes Maternal Uncle     Allergies  Allergen Reactions  . Rofecoxib   . Latex Rash    Pt states that latex causes blisters to skin    Current Outpatient Prescriptions on File Prior to Visit  Medication Sig Dispense Refill  . atorvastatin (LIPITOR) 40 MG tablet Take 1 tablet (40 mg total) by mouth daily.  90 tablet  3  . calcium carbonate 200 MG capsule Take 250 mg by mouth 2 (two) times daily with a meal.        . carvedilol (COREG) 6.25 MG tablet Take 1 tablet (6.25 mg total) by mouth 2 (two) times daily.  180 tablet  3  . clopidogrel (PLAVIX) 75 MG tablet Take 1 tablet (75 mg total) by mouth daily.  90 tablet  3  . co-enzyme Q-10 30 MG capsule Take 10 mg by mouth  daily.       . diazepam (VALIUM) 5 MG tablet 1/4 tab qd prn  30 tablet  5  . levothyroxine (SYNTHROID, LEVOTHROID) 75 MCG tablet Take 1 tablet (75 mcg total) by mouth daily.  90 tablet  3  . magnesium 30 MG tablet Take 30 mg by mouth daily.        Marland Kitchen PAPAYA PO Take by mouth 3 x daily with food.      . Probiotic Product (ALIGN) 4 MG CAPS Take 1 capsule by mouth daily.  30 capsule  0  . traMADol (ULTRAM) 50 MG tablet Take 1 tablet (50 mg total) by mouth every 6 (six) hours as needed for pain.  180 tablet  2  . vitamin D, CHOLECALCIFEROL, 400 UNITS tablet Take 400 Units by mouth daily.        Marland Kitchen DISCONTD: furosemide (LASIX) 20 MG tablet Take 1 tablet (20 mg total) by mouth daily.  30 tablet  3    BP 140/80  Pulse 76  Temp 98.2 F (36.8 C)   Resp 16  Ht 5\' 3"  (1.6 m)  Wt 148 lb (67.132 kg)  BMI 26.22 kg/m2       Objective:   Physical Exam  Nursing note and vitals reviewed. Constitutional: She is oriented to person, place, and time. She appears well-developed and well-nourished. No distress.  HENT:  Head: Normocephalic and atraumatic.  Right Ear: External ear normal.  Left Ear: External ear normal.  Nose: Nose normal.  Mouth/Throat: Oropharynx is clear and moist.  Eyes: Conjunctivae and EOM are normal. Pupils are equal, round, and reactive to light.  Neck: Normal range of motion. Neck supple. No JVD present. No tracheal deviation present. No thyromegaly present.  Cardiovascular: Normal rate, regular rhythm, normal heart sounds and intact distal pulses.   No murmur heard. Pulmonary/Chest: Effort normal and breath sounds normal. She has no wheezes. She exhibits no tenderness.  Abdominal: Soft. Bowel sounds are normal.  Musculoskeletal: Normal range of motion. She exhibits no edema and no tenderness.  Lymphadenopathy:    She has no cervical adenopathy.  Neurological: She is alert and oriented to person, place, and time. She has normal reflexes. No cranial nerve deficit.  Skin: Skin is warm and dry. She is not diaphoretic.  Psychiatric: She has a normal mood and affect. Her behavior is normal.          Assessment & Plan:

## 2012-03-29 NOTE — Patient Instructions (Addendum)
Get a chest xray and rib details on the left posterior Today or tomorrow    The Coreg back to a half tablet twice a day and take the losartan half tablet twice a day

## 2012-03-30 ENCOUNTER — Ambulatory Visit (INDEPENDENT_AMBULATORY_CARE_PROVIDER_SITE_OTHER)
Admission: RE | Admit: 2012-03-30 | Discharge: 2012-03-30 | Disposition: A | Discharge status: Home / Self Care | Payer: Medicare Other | Source: Ambulatory Visit | Attending: Internal Medicine | Admitting: Internal Medicine

## 2012-03-30 DIAGNOSIS — R55 Syncope and collapse: Secondary | ICD-10-CM

## 2012-03-30 DIAGNOSIS — W1809XA Striking against other object with subsequent fall, initial encounter: Secondary | ICD-10-CM

## 2012-03-30 DIAGNOSIS — W1800XA Striking against unspecified object with subsequent fall, initial encounter: Secondary | ICD-10-CM

## 2012-04-02 ENCOUNTER — Telehealth: Payer: Self-pay | Admitting: Internal Medicine

## 2012-04-02 NOTE — Telephone Encounter (Signed)
Patient would like a call back with xray results. Please assist.

## 2012-04-02 NOTE — Telephone Encounter (Signed)
Pt informed neg chest xray

## 2012-05-24 ENCOUNTER — Other Ambulatory Visit: Payer: Self-pay | Admitting: *Deleted

## 2012-05-24 ENCOUNTER — Ambulatory Visit (INDEPENDENT_AMBULATORY_CARE_PROVIDER_SITE_OTHER): Payer: Medicare Other | Admitting: Internal Medicine

## 2012-05-24 VITALS — BP 110/70 | HR 72 | Temp 98.2°F | Resp 16 | Ht 63.0 in | Wt 150.0 lb

## 2012-05-24 DIAGNOSIS — W1809XA Striking against other object with subsequent fall, initial encounter: Secondary | ICD-10-CM

## 2012-05-24 DIAGNOSIS — W1800XA Striking against unspecified object with subsequent fall, initial encounter: Secondary | ICD-10-CM

## 2012-05-24 DIAGNOSIS — I519 Heart disease, unspecified: Secondary | ICD-10-CM

## 2012-05-24 DIAGNOSIS — I5189 Other ill-defined heart diseases: Secondary | ICD-10-CM

## 2012-05-24 DIAGNOSIS — I2510 Atherosclerotic heart disease of native coronary artery without angina pectoris: Secondary | ICD-10-CM

## 2012-05-24 MED ORDER — LEVOTHYROXINE SODIUM 75 MCG PO TABS: 75.0000 ug | ORAL_TABLET | Freq: Every day | ORAL | Status: DC

## 2012-05-24 MED ORDER — ATORVASTATIN CALCIUM 40 MG PO TABS: 40.0000 mg | ORAL_TABLET | Freq: Every day | ORAL | Status: DC

## 2012-05-24 MED ORDER — DIAZEPAM 5 MG PO TABS: ORAL_TABLET | ORAL | Status: DC

## 2012-05-24 MED ORDER — TRAMADOL HCL 50 MG PO TABS: 50.0000 mg | ORAL_TABLET | Freq: Four times a day (QID) | ORAL | Status: DC | PRN

## 2012-05-24 MED ORDER — LOSARTAN POTASSIUM-HCTZ 100-12.5 MG PO TABS: ORAL_TABLET | ORAL | Status: DC

## 2012-05-24 MED ORDER — CLOPIDOGREL BISULFATE 75 MG PO TABS: 75.0000 mg | ORAL_TABLET | Freq: Every day | ORAL | Status: DC

## 2012-05-24 MED ORDER — CARVEDILOL 6.25 MG PO TABS: 6.2500 mg | ORAL_TABLET | Freq: Two times a day (BID) | ORAL | Status: DC

## 2012-05-24 NOTE — Progress Notes (Signed)
  Subjective:    Patient ID: Kathleen Thompson, female    DOB: 26-Dec-1933, 78 y.o.   MRN: [id number]  HPI Patient has split her medications and taking 1/2 of med's BID with much improved control and less side effects   Review of Systems  Constitutional: Negative for activity change, appetite change and fatigue.  HENT: Negative for ear pain, congestion, neck pain, postnasal drip and sinus pressure.   Eyes: Negative for redness and visual disturbance.  Respiratory: Negative for cough, shortness of breath and wheezing.   Gastrointestinal: Negative for abdominal pain and abdominal distention.  Genitourinary: Negative for dysuria, frequency and menstrual problem.  Musculoskeletal: Negative for myalgias, joint swelling and arthralgias.  Skin: Negative for rash and wound.  Neurological: Negative for dizziness, weakness and headaches.  Hematological: Negative for adenopathy. Does not bruise/bleed easily.  Psychiatric/Behavioral: Negative for sleep disturbance and decreased concentration.       Objective:   Physical Exam  Constitutional: She is oriented to person, place, and time. She appears well-developed and well-nourished. No distress.  HENT:  Head: Normocephalic and atraumatic.  Right Ear: External ear normal.  Left Ear: External ear normal.  Eyes: Conjunctivae normal and EOM are normal. Pupils are equal, round, and reactive to light.  Neck: Normal range of motion. Neck supple. No JVD present. No tracheal deviation present. No thyromegaly present.  Cardiovascular: Normal rate and regular rhythm.   Murmur heard. Pulmonary/Chest: Effort normal and breath sounds normal. She has no wheezes. She exhibits no tenderness.  Abdominal: Soft. Bowel sounds are normal.  Musculoskeletal: She exhibits edema. She exhibits no tenderness.  Lymphadenopathy:    She has no cervical adenopathy.  Neurological: She is oriented to person, place, and time. She has normal reflexes. No cranial nerve deficit.    Skin: She is not diaphoretic.  Psychiatric: She has a normal mood and affect. Her behavior is normal.          Assessment & Plan:  Improved diastollic dysfunction and less SOB

## 2012-05-24 NOTE — Patient Instructions (Signed)
The patient is instructed to continue all medications as prescribed. Schedule followup with check out clerk upon leaving the clinic  

## 2012-06-09 ENCOUNTER — Ambulatory Visit: Payer: Medicare Other | Admitting: Internal Medicine

## 2012-06-10 ENCOUNTER — Encounter: Payer: Self-pay | Admitting: Internal Medicine

## 2012-06-10 ENCOUNTER — Ambulatory Visit (INDEPENDENT_AMBULATORY_CARE_PROVIDER_SITE_OTHER): Payer: Medicare Other | Admitting: Internal Medicine

## 2012-06-10 VITALS — BP 136/80 | HR 72 | Temp 98.3°F | Resp 16 | Ht 62.0 in | Wt 150.0 lb

## 2012-06-10 DIAGNOSIS — E039 Hypothyroidism, unspecified: Secondary | ICD-10-CM

## 2012-06-10 DIAGNOSIS — K219 Gastro-esophageal reflux disease without esophagitis: Secondary | ICD-10-CM

## 2012-06-10 DIAGNOSIS — I10 Essential (primary) hypertension: Secondary | ICD-10-CM

## 2012-06-10 DIAGNOSIS — T887XXA Unspecified adverse effect of drug or medicament, initial encounter: Secondary | ICD-10-CM

## 2012-06-10 DIAGNOSIS — Z0000 Encounter for general adult medical examination without abnormal findings: Secondary | ICD-10-CM

## 2012-06-10 DIAGNOSIS — E785 Hyperlipidemia, unspecified: Secondary | ICD-10-CM

## 2012-06-10 LAB — CBC WITH DIFFERENTIAL/PLATELET
Basophils Relative: 0.6 % (ref 0.0–3.0)
Eosinophils Relative: 1.3 % (ref 0.0–5.0)
HCT: 41.1 % (ref 36.0–46.0)
Hemoglobin: 13.5 g/dL (ref 12.0–15.0)
Lymphs Abs: 2.2 10*3/uL (ref 0.7–4.0)
MCV: 94.8 fl (ref 78.0–100.0)
Monocytes Absolute: 0.5 10*3/uL (ref 0.1–1.0)
Monocytes Relative: 10.5 % (ref 3.0–12.0)
Neutro Abs: 2.3 10*3/uL (ref 1.4–7.7)
RBC: 4.33 Mil/uL (ref 3.87–5.11)
WBC: 5.1 10*3/uL (ref 4.5–10.5)

## 2012-06-10 LAB — POCT URINALYSIS DIPSTICK
Bilirubin, UA: NEGATIVE
Blood, UA: NEGATIVE
Glucose, UA: NEGATIVE
Ketones, UA: NEGATIVE
Nitrite, UA: NEGATIVE
Spec Grav, UA: 1.01
pH, UA: 6.5

## 2012-06-10 LAB — HEPATIC FUNCTION PANEL
ALT: 17 U/L (ref 0–35)
Albumin: 3.7 g/dL (ref 3.5–5.2)
Alkaline Phosphatase: 70 U/L (ref 39–117)
Bilirubin, Direct: 0 mg/dL (ref 0.0–0.3)
Total Protein: 6.9 g/dL (ref 6.0–8.3)

## 2012-06-10 LAB — LIPID PANEL
HDL: 54 mg/dL (ref >39.00)
Total CHOL/HDL Ratio: 3

## 2012-06-10 LAB — BASIC METABOLIC PANEL
CO2: 29 mEq/L (ref 19–32)
Calcium: 9.4 mg/dL (ref 8.4–10.5)
Chloride: 103 mEq/L (ref 96–112)
Creatinine, Ser: 0.9 mg/dL (ref 0.4–1.2)
Glucose, Bld: 101 mg/dL — ABNORMAL HIGH (ref 70–99)
Sodium: 139 mEq/L (ref 135–145)

## 2012-06-10 LAB — TSH: TSH: 0.26 u[IU]/mL — ABNORMAL LOW (ref 0.35–5.50)

## 2012-06-10 NOTE — Progress Notes (Signed)
Subjective:    Patient ID: Kathleen Thompson, female    DOB: [birth year], 78 y.o.   MRN: [id number]  HPI  Follow up for HTN. hyperlipidemia and GERD Has been stable for CAD stand point For monitoring labs today Has hx of allergies and has an acute flare of vasomotor rhinitis   Review of Systems  Constitutional: Negative for activity change, appetite change and fatigue.  HENT: Negative for ear pain, congestion, neck pain, postnasal drip and sinus pressure.   Eyes: Negative for redness and visual disturbance.  Respiratory: Negative for cough, shortness of breath and wheezing.   Gastrointestinal: Negative for abdominal pain and abdominal distention.  Genitourinary: Negative for dysuria, frequency and menstrual problem.  Musculoskeletal: Negative for myalgias, joint swelling and arthralgias.  Skin: Negative for rash and wound.  Neurological: Negative for dizziness, weakness and headaches.  Hematological: Negative for adenopathy. Does not bruise/bleed easily.  Psychiatric/Behavioral: Negative for disturbed wake/sleep cycle and decreased concentration.   Past Medical History  Diagnosis Date  . Internal hemorrhoid   . Diverticulosis   . Gastroparesis   . GERD (gastroesophageal reflux disease)   . Adenomatous colon polyp 02/2003  . Cellulitis and abscess of hand   . Hyperlipidemia   . PVD (peripheral vascular disease)   . Hypothyroidism   . Hypertension   . CAD (coronary artery disease)   . Iron deficiency anemia   . Esophageal stricture   . Arthritis   . Blood transfusion   . Cataract   . Osteoporosis     History   Social History  . Marital Status: Married    Spouse Name: N/A    Number of Children: N/A  . Years of Education: N/A   Occupational History  . Retired    Social History Main Topics  . Smoking status: Never Smoker   . Smokeless tobacco: Never Used  . Alcohol Use: No  . Drug Use: No  . Sexually Active: Yes   Other Topics Concern  . Not on file   Social  History Narrative  . No narrative on file    Past Surgical History  Procedure Date  . Appendectomy   . Abdominal hysterectomy   . Cervical fusion   . Shoulder surgery      Family History  Problem Relation Age of Onset  . Colon cancer Brother     at age 58  . Colon cancer Sister     at age 85  . Diabetes Mother   . Diabetes Maternal Aunt   . Diabetes Maternal Uncle     Allergies  Allergen Reactions  . Rofecoxib   . Latex Rash    Pt states that latex causes blisters to skin    Current Outpatient Prescriptions on File Prior to Visit  Medication Sig Dispense Refill  . atorvastatin (LIPITOR) 40 MG tablet Take 1 tablet (40 mg total) by mouth daily.  90 tablet  3  . calcium carbonate 200 MG capsule Take 250 mg by mouth 2 (two) times daily with a meal.        . carvedilol (COREG) 6.25 MG tablet Take 1 tablet (6.25 mg total) by mouth 2 (two) times daily.  180 tablet  3  . clopidogrel (PLAVIX) 75 MG tablet Take 1 tablet (75 mg total) by mouth daily.  90 tablet  3  . co-enzyme Q-10 30 MG capsule Take 10 mg by mouth daily.       . diazepam (VALIUM) 5 MG tablet 1/4 tab qd prn  30  tablet  5  . furosemide (LASIX) 20 MG tablet Take 20 mg by mouth daily as needed.      Marland Kitchen levothyroxine (SYNTHROID, LEVOTHROID) 75 MCG tablet Take 1 tablet (75 mcg total) by mouth daily.  90 tablet  3  . losartan-hydrochlorothiazide (HYZAAR) 100-12.5 MG per tablet 1/2 tab bid  90 tablet  3  . magnesium 30 MG tablet Take 30 mg by mouth daily.        Marland Kitchen PAPAYA PO Take by mouth 3 x daily with food.      . Probiotic Product (ALIGN) 4 MG CAPS Take 1 capsule by mouth daily.  30 capsule  0  . traMADol (ULTRAM) 50 MG tablet Take 1 tablet (50 mg total) by mouth every 6 (six) hours as needed for pain.  180 tablet  2  . vitamin D, CHOLECALCIFEROL, 400 UNITS tablet Take 400 Units by mouth daily.          BP 136/80  Pulse 72  Temp 98.3 F (36.8 C)  Resp 16  Ht 5\' 2"  (1.575 m)  Wt 150 lb (68.04 kg)  BMI 27.44  kg/m2        Objective:   Physical Exam  Constitutional: She is oriented to person, place, and time. She appears well-developed and well-nourished. No distress.  HENT:  Head: Normocephalic and atraumatic.  Right Ear: External ear normal.  Left Ear: External ear normal.  Nose: Nose normal.  Mouth/Throat: Oropharynx is clear and moist.  Eyes: Conjunctivae normal and EOM are normal. Pupils are equal, round, and reactive to light.  Neck: Normal range of motion. Neck supple. No JVD present. No tracheal deviation present. No thyromegaly present.  Cardiovascular: Normal rate, regular rhythm, normal heart sounds and intact distal pulses.   No murmur heard. Pulmonary/Chest: Effort normal and breath sounds normal. She has no wheezes. She exhibits no tenderness.  Abdominal: Soft. Bowel sounds are normal.  Musculoskeletal: Normal range of motion. She exhibits no edema and no tenderness.  Lymphadenopathy:    She has no cervical adenopathy.  Neurological: She is alert and oriented to person, place, and time. She has normal reflexes. No cranial nerve deficit.  Skin: Skin is warm and dry. She is not diaphoretic.  Psychiatric: She has a normal mood and affect. Her behavior is normal.          Assessment & Plan:  Has noted increased edema in legs and has been traveling with increased salt with eating out and travelling HTN stable Increased peripheral edema Stable GERD Stable CAD monitor lipids and thyroid  Subjective:    Kathleen Thompson is a 78 y.o. female who presents for Medicare Annual/Subsequent preventive examination.  Preventive Screening-Counseling & Management  Tobacco History  Smoking status  . Never Smoker   Smokeless tobacco  . Never Used     Problems Prior to Visit 1.   Current Problems (verified) Patient Active Problem List  Diagnosis  . ADENOMATOUS COLONIC POLYP  . HYPOTHYROIDISM  . HYPERLIPIDEMIA  . HYPERTENSION  . CORONARY ARTERY DISEASE  . PERIPHERAL  VASCULAR DISEASE  . INTERNAL HEMORRHOIDS  . COUGH VARIANT ASTHMA  . CHRONIC OBSTRUCTIVE PULMONARY DISEASE, MILD  . ESOPHAGEAL STRICTURE  . GERD  . GASTROPARESIS  . DIVERTICULOSIS, COLON  . CELLULITIS&ABSCESS OF HAND EXCEPT FINGERS&THUMB  . SYNCOPE  . FACIAL PAIN  . PALPITATIONS, RECURRENT  . DYSPNEA ON EXERTION  . COUGH  . DYSPHAGIA  . UNS ADVRS EFF UNS RX MEDICINAL&BIOLOGICAL SBSTNC  . Family history of malignant  neoplasm of gastrointestinal tract  . PERSONAL HX COLONIC POLYPS    Medications Prior to Visit Current Outpatient Prescriptions on File Prior to Visit  Medication Sig Dispense Refill  . atorvastatin (LIPITOR) 40 MG tablet Take 1 tablet (40 mg total) by mouth daily.  90 tablet  3  . calcium carbonate 200 MG capsule Take 250 mg by mouth 2 (two) times daily with a meal.        . carvedilol (COREG) 6.25 MG tablet Take 1 tablet (6.25 mg total) by mouth 2 (two) times daily.  180 tablet  3  . clopidogrel (PLAVIX) 75 MG tablet Take 1 tablet (75 mg total) by mouth daily.  90 tablet  3  . co-enzyme Q-10 30 MG capsule Take 10 mg by mouth daily.       . diazepam (VALIUM) 5 MG tablet 1/4 tab qd prn  30 tablet  5  . furosemide (LASIX) 20 MG tablet Take 20 mg by mouth daily as needed.      Marland Kitchen levothyroxine (SYNTHROID, LEVOTHROID) 75 MCG tablet Take 1 tablet (75 mcg total) by mouth daily.  90 tablet  3  . losartan-hydrochlorothiazide (HYZAAR) 100-12.5 MG per tablet 1/2 tab bid  90 tablet  3  . magnesium 30 MG tablet Take 30 mg by mouth daily.        Marland Kitchen PAPAYA PO Take by mouth 3 x daily with food.      . Probiotic Product (ALIGN) 4 MG CAPS Take 1 capsule by mouth daily.  30 capsule  0  . traMADol (ULTRAM) 50 MG tablet Take 1 tablet (50 mg total) by mouth every 6 (six) hours as needed for pain.  180 tablet  2  . vitamin D, CHOLECALCIFEROL, 400 UNITS tablet Take 400 Units by mouth daily.          Current Medications (verified) Current Outpatient Prescriptions  Medication Sig Dispense  Refill  . atorvastatin (LIPITOR) 40 MG tablet Take 1 tablet (40 mg total) by mouth daily.  90 tablet  3  . calcium carbonate 200 MG capsule Take 250 mg by mouth 2 (two) times daily with a meal.        . carvedilol (COREG) 6.25 MG tablet Take 1 tablet (6.25 mg total) by mouth 2 (two) times daily.  180 tablet  3  . clopidogrel (PLAVIX) 75 MG tablet Take 1 tablet (75 mg total) by mouth daily.  90 tablet  3  . co-enzyme Q-10 30 MG capsule Take 10 mg by mouth daily.       . diazepam (VALIUM) 5 MG tablet 1/4 tab qd prn  30 tablet  5  . furosemide (LASIX) 20 MG tablet Take 20 mg by mouth daily as needed.      Marland Kitchen levothyroxine (SYNTHROID, LEVOTHROID) 75 MCG tablet Take 1 tablet (75 mcg total) by mouth daily.  90 tablet  3  . losartan-hydrochlorothiazide (HYZAAR) 100-12.5 MG per tablet 1/2 tab bid  90 tablet  3  . magnesium 30 MG tablet Take 30 mg by mouth daily.        Marland Kitchen PAPAYA PO Take by mouth 3 x daily with food.      . Probiotic Product (ALIGN) 4 MG CAPS Take 1 capsule by mouth daily.  30 capsule  0  . traMADol (ULTRAM) 50 MG tablet Take 1 tablet (50 mg total) by mouth every 6 (six) hours as needed for pain.  180 tablet  2  . vitamin D, CHOLECALCIFEROL, 400 UNITS tablet Take  400 Units by mouth daily.           Allergies (verified) Rofecoxib and Latex   PAST HISTORY  Family History Family History  Problem Relation Age of Onset  . Colon cancer Brother     at age 39  . Colon cancer Sister     at age 92  . Diabetes Mother   . Diabetes Maternal Aunt   . Diabetes Maternal Uncle     Social History History  Substance Use Topics  . Smoking status: Never Smoker   . Smokeless tobacco: Never Used  . Alcohol Use: No     Are there smokers in your home (other than you)? No  Risk Factors Current exercise habits: The patient does not participate in regular exercise at present.  Dietary issues discussed: none   Cardiac risk factors: advanced age (older than 58 for men, 44 for women),  dyslipidemia, hypertension and sedentary lifestyle.  Depression Screen (Note: if answer to either of the following is "Yes", a more complete depression screening is indicated)   Over the past two weeks, have you felt down, depressed or hopeless? No  Over the past two weeks, have you felt little interest or pleasure in doing things? No  Have you lost interest or pleasure in daily life? No  Do you often feel hopeless? No  Do you cry easily over simple problems? No  Activities of Daily Living In your present state of health, do you have any difficulty performing the following activities?:  Driving? No Managing money?  No Feeding yourself? No Getting from bed to chair? No Climbing a flight of stairs? No Preparing food and eating?: No Bathing or showering? No Getting dressed: No Getting to the toilet? No Using the toilet:No Moving around from place to place: No In the past year have you fallen or had a near fall?:No   Are you sexually active?  Yes  Do you have more than one partner?  No  Hearing Difficulties: No Do you often ask people to speak up or repeat themselves? No Do you experience ringing or noises in your ears? No Do you have difficulty understanding soft or whispered voices? No   Do you feel that you have a problem with memory? No  Do you often misplace items? No  Do you feel safe at home?  Yes  Cognitive Testing  Alert? Yes  Normal Appearance?Yes  Oriented to person? Yes  Place? Yes   Time? Yes  Recall of three objects?  Yes  Can perform simple calculations? Yes  Displays appropriate judgment?Yes  Can read the correct time from a watch face?Yes   Advanced Directives have been discussed with the patient? Yes  List the Names of Other Physician/Practitioners you currently use: 1.    Indicate any recent Medical Services you may have received from other than [facility] providers in the past year (date may be approximate).  Immunization History  Administered Date(s)  Administered  . Influenza Split 05/11/2012  . Influenza Whole 05/11/2009  . Pneumococcal Polysaccharide 02/18/2010  . Td 02/18/2010  . Zoster 02/26/2011    Screening Tests Health Maintenance  Topic Date Due  . Influenza Vaccine  04/11/2012  . Colonoscopy  08/20/2016  . Tetanus/tdap  02/19/2020  . Pneumococcal Polysaccharide Vaccine Age 63 And Over  Completed  . Zostavax  Completed    All answers were reviewed with the patient and necessary referrals were made:  Carrie Mew, MD   06/10/2012   History reviewed: allergies, current  medications, past family history, past medical history, past social history, past surgical history and problem list  Review of Systems A comprehensive review of systems was negative.    Objective:     Vision by Snellen chart: right eye:20/20, left eye:20/20 post cataracs  Body mass index is 27.44 kg/(m^2). BP 136/80  Pulse 72  Temp 98.3 F (36.8 C)  Resp 16  Ht 5\' 2"  (1.575 m)  Wt 150 lb (68.04 kg)  BMI 27.44 kg/m2  BP 136/80  Pulse 72  Temp 98.3 F (36.8 C)  Resp 16  Ht 5\' 2"  (1.575 m)  Wt 150 lb (68.04 kg)  BMI 27.44 kg/m2  General Appearance:    Alert, cooperative, no distress, appears stated age  Head:    Normocephalic, without obvious abnormality, atraumatic  Eyes:    PERRL, conjunctiva/corneas clear, EOM's intact, fundi    benign, both eyes  Ears:    Normal TM's and external ear canals, both ears  Nose:   Nares normal, septum midline, mucosa normal, no drainage    or sinus tenderness  Throat:   Lips, mucosa, and tongue normal; teeth and gums normal  Neck:   Supple, symmetrical, trachea midline, no adenopathy;    thyroid:  no enlargement/tenderness/nodules; no carotid   bruit or JVD  Back:     Symmetric, no curvature, ROM normal, no CVA tenderness  Lungs:     Clear to auscultation bilaterally, respirations unlabored  Chest Wall:    No tenderness or deformity   Heart:    Regular rate and rhythm, S1 and S2 normal, no  murmur, rub   or gallop  Breast Exam:    No tenderness, masses, or nipple abnormality  Abdomen:     Soft, non-tender, bowel sounds active all four quadrants,    no masses, no organomegaly  Genitalia:    Normal female without lesion, discharge or tenderness  Rectal:    Normal tone, normal prostate, no masses or tenderness;   guaiac negative stool  Extremities:   Extremities normal, atraumatic, no cyanosis or edema  Pulses:   2+ and symmetric all extremities  Skin:   Skin color, texture, turgor normal, no rashes or lesions  Lymph nodes:   Cervical, supraclavicular, and axillary nodes normal  Neurologic:   CNII-XII intact, normal strength, sensation and reflexes    throughout       Assessment:      This is a routine physical examination for this healthy  Female. Reviewed all health maintenance protocols including mammography colonoscopy bone density and reviewed appropriate screening labs. Her immunization history was reviewed as well as her current medications and allergies refills of her chronic medications were given and the plan for yearly health maintenance was discussed all orders and referrals were made as appropriate.      Plan:     During the course of the visit the patient was educated and counseled about appropriate screening and preventive services including:    Influenza vaccine  Screening mammography  Colorectal cancer screening  Diet review for nutrition referral? Yes ____  Not Indicated ____   Patient Instructions (the written plan) was given to the patient.  Medicare Attestation I have personally reviewed: The patient's medical and social history Their use of alcohol, tobacco or illicit drugs Their current medications and supplements The patient's functional ability including ADLs,fall risks, home safety risks, cognitive, and hearing and visual impairment Diet and physical activities Evidence for depression or mood disorders  The patient's weight,  height, BMI,  and visual acuity have been recorded in the chart.  I have made referrals, counseling, and provided education to the patient based on review of the above and I have provided the patient with a written personalized care plan for preventive services.     Carrie Mew, MD   06/10/2012

## 2012-06-10 NOTE — Patient Instructions (Addendum)
The patient is instructed to continue all medications as prescribed. Schedule followup with check out clerk upon leaving the clinic  

## 2012-06-23 ENCOUNTER — Encounter: Payer: Medicare Other | Admitting: Internal Medicine

## 2012-08-23 ENCOUNTER — Encounter: Payer: Self-pay | Admitting: Internal Medicine

## 2012-08-23 NOTE — Addendum Note (Signed)
Addended by: Stacie Glaze on: 08/23/2012 02:23 PM   Modules accepted: Level of Service, SmartSet

## 2012-08-23 NOTE — Progress Notes (Signed)
Subjective:    Kathleen Thompson is a 79 y.o. female who presents for Medicare Annual/Subsequent preventive examination.  Preventive Screening-Counseling & Management  Tobacco History  Smoking status  . Never Smoker   Smokeless tobacco  . Never Used     Problems Prior to Visit 1.   Current Problems (verified) Patient Active Problem List  Diagnosis  . ADENOMATOUS COLONIC POLYP  . HYPOTHYROIDISM  . HYPERLIPIDEMIA  . HYPERTENSION  . CORONARY ARTERY DISEASE  . PERIPHERAL VASCULAR DISEASE  . INTERNAL HEMORRHOIDS  . COUGH VARIANT ASTHMA  . CHRONIC OBSTRUCTIVE PULMONARY DISEASE, MILD  . ESOPHAGEAL STRICTURE  . GERD  . GASTROPARESIS  . DIVERTICULOSIS, COLON  . CELLULITIS&ABSCESS OF HAND EXCEPT FINGERS&THUMB  . SYNCOPE  . FACIAL PAIN  . PALPITATIONS, RECURRENT  . DYSPNEA ON EXERTION  . COUGH  . DYSPHAGIA  . UNS ADVRS EFF UNS RX MEDICINAL&BIOLOGICAL SBSTNC  . Family history of malignant neoplasm of gastrointestinal tract  . PERSONAL HX COLONIC POLYPS    Medications Prior to Visit Current Outpatient Prescriptions on File Prior to Visit  Medication Sig Dispense Refill  . atorvastatin (LIPITOR) 40 MG tablet Take 1 tablet (40 mg total) by mouth daily.  90 tablet  3  . calcium carbonate 200 MG capsule Take 250 mg by mouth 2 (two) times daily with a meal.        . carvedilol (COREG) 6.25 MG tablet Take 1 tablet (6.25 mg total) by mouth 2 (two) times daily.  180 tablet  3  . clopidogrel (PLAVIX) 75 MG tablet Take 1 tablet (75 mg total) by mouth daily.  90 tablet  3  . co-enzyme Q-10 30 MG capsule Take 10 mg by mouth daily.       . diazepam (VALIUM) 5 MG tablet 1/4 tab qd prn  30 tablet  5  . furosemide (LASIX) 20 MG tablet Take 20 mg by mouth daily as needed.      Marland Kitchen levothyroxine (SYNTHROID, LEVOTHROID) 75 MCG tablet Take 1 tablet (75 mcg total) by mouth daily.  90 tablet  3  . losartan-hydrochlorothiazide (HYZAAR) 100-12.5 MG per tablet 1/2 tab bid  90 tablet  3  . magnesium  30 MG tablet Take 30 mg by mouth daily.        Marland Kitchen PAPAYA PO Take by mouth 3 x daily with food.      . Probiotic Product (ALIGN) 4 MG CAPS Take 1 capsule by mouth daily.  30 capsule  0  . traMADol (ULTRAM) 50 MG tablet Take 1 tablet (50 mg total) by mouth every 6 (six) hours as needed for pain.  180 tablet  2  . vitamin D, CHOLECALCIFEROL, 400 UNITS tablet Take 400 Units by mouth daily.          Current Medications (verified) Current Outpatient Prescriptions  Medication Sig Dispense Refill  . atorvastatin (LIPITOR) 40 MG tablet Take 1 tablet (40 mg total) by mouth daily.  90 tablet  3  . calcium carbonate 200 MG capsule Take 250 mg by mouth 2 (two) times daily with a meal.        . carvedilol (COREG) 6.25 MG tablet Take 1 tablet (6.25 mg total) by mouth 2 (two) times daily.  180 tablet  3  . clopidogrel (PLAVIX) 75 MG tablet Take 1 tablet (75 mg total) by mouth daily.  90 tablet  3  . co-enzyme Q-10 30 MG capsule Take 10 mg by mouth daily.       . diazepam (VALIUM)  5 MG tablet 1/4 tab qd prn  30 tablet  5  . furosemide (LASIX) 20 MG tablet Take 20 mg by mouth daily as needed.      Marland Kitchen levothyroxine (SYNTHROID, LEVOTHROID) 75 MCG tablet Take 1 tablet (75 mcg total) by mouth daily.  90 tablet  3  . losartan-hydrochlorothiazide (HYZAAR) 100-12.5 MG per tablet 1/2 tab bid  90 tablet  3  . magnesium 30 MG tablet Take 30 mg by mouth daily.        Marland Kitchen PAPAYA PO Take by mouth 3 x daily with food.      . Probiotic Product (ALIGN) 4 MG CAPS Take 1 capsule by mouth daily.  30 capsule  0  . traMADol (ULTRAM) 50 MG tablet Take 1 tablet (50 mg total) by mouth every 6 (six) hours as needed for pain.  180 tablet  2  . vitamin D, CHOLECALCIFEROL, 400 UNITS tablet Take 400 Units by mouth daily.           Allergies (verified) Rofecoxib and Latex   PAST HISTORY  Family History Family History  Problem Relation Age of Onset  . Colon cancer Brother     at age 55  . Colon cancer Sister     at age 44  .  Diabetes Mother   . Diabetes Maternal Aunt   . Diabetes Maternal Uncle     Social History History  Substance Use Topics  . Smoking status: Never Smoker   . Smokeless tobacco: Never Used  . Alcohol Use: No     Are there smokers in your home (other than you)? No  Risk Factors Current exercise habits: The patient does not participate in regular exercise at present.  Dietary issues discussed: weight control   Cardiac risk factors: advanced age (older than 48 for men, 70 for women), dyslipidemia, hypertension and sedentary lifestyle.  Depression Screen (Note: if answer to either of the following is "Yes", a more complete depression screening is indicated)   Over the past two weeks, have you felt down, depressed or hopeless? No  Over the past two weeks, have you felt little interest or pleasure in doing things? No  Have you lost interest or pleasure in daily life? No  Do you often feel hopeless? No  Do you cry easily over simple problems? No  Activities of Daily Living In your present state of health, do you have any difficulty performing the following activities?:  Driving? No Managing money?  No Feeding yourself? No Getting from bed to chair? No Climbing a flight of stairs? No Preparing food and eating?: No Bathing or showering? No Getting dressed: No Getting to the toilet? No Using the toilet:No Moving around from place to place: No In the past year have you fallen or had a near fall?:No   Are you sexually active?  Yes  Do you have more than one partner?  No  Hearing Difficulties: No Do you often ask people to speak up or repeat themselves? No Do you experience ringing or noises in your ears? No Do you have difficulty understanding soft or whispered voices? No   Do you feel that you have a problem with memory? Yes  Do you often misplace items? No  Do you feel safe at home?  Yes  Cognitive Testing  Alert? Yes  Normal Appearance?Yes  Oriented to person? Yes   Place? Yes   Time? Yes  Recall of three objects?  Yes  Can perform simple calculations? No  Displays  appropriate judgment?Yes  Can read the correct time from a watch face?Yes   Advanced Directives have been discussed with the patient? Yes  List the Names of Other Physician/Practitioners you currently use: 1.    Indicate any recent Medical Services you may have received from other than [facility] providers in the past year (date may be approximate).  Immunization History  Administered Date(s) Administered  . Influenza Split 05/11/2012  . Influenza Whole 05/11/2009  . Pneumococcal Polysaccharide 02/18/2010  . Td 02/18/2010  . Zoster 02/26/2011    Screening Tests Health Maintenance  Topic Date Due  . Influenza Vaccine  04/11/2013  . Colonoscopy  08/20/2016  . Tetanus/tdap  02/19/2020  . Pneumococcal Polysaccharide Vaccine Age 63 And Over  Completed  . Zostavax  Completed    All answers were reviewed with the patient and necessary referrals were made:  Carrie Mew, MD   08/23/2012   History reviewed: allergies, current medications, past family history, past medical history, past social history, past surgical history and problem list  Review of Systems A comprehensive review of systems was negative except for: Cardiovascular: positive for fatigue and irregular heart beat    Objective:     Vision by Snellen chart: right eye:20/20, left eye:20/20  Body mass index is 27.44 kg/(m^2). BP 136/80  Pulse 72  Temp 98.3 F (36.8 C)  Resp 16  Ht 5\' 2"  (1.575 m)  Wt 150 lb (68.04 kg)  BMI 27.44 kg/m2  BP 136/80  Pulse 72  Temp 98.3 F (36.8 C)  Resp 16  Ht 5\' 2"  (1.575 m)  Wt 150 lb (68.04 kg)  BMI 27.44 kg/m2  General Appearance:    Alert, cooperative, no distress, appears stated age  Head:    Normocephalic, without obvious abnormality, atraumatic  Eyes:    PERRL, conjunctiva/corneas clear, EOM's intact, fundi    benign, both eyes  Ears:    Normal TM's and external  ear canals, both ears  Nose:   Nares normal, septum midline, mucosa normal, no drainage    or sinus tenderness  Throat:   Lips, mucosa, and tongue normal; teeth and gums normal  Neck:   Supple, symmetrical, trachea midline, no adenopathy;    thyroid:  no enlargement/tenderness/nodules; no carotid   bruit or JVD  Back:     Symmetric, no curvature, ROM normal, no CVA tenderness  Lungs:     Clear to auscultation bilaterally, respirations unlabored  Chest Wall:    No tenderness or deformity   Heart:    Regular rate and rhythm, S1 and S2 normal, no murmur, rub   or gallop  Breast Exam:    No tenderness, masses, or nipple abnormality  Abdomen:     Soft, non-tender, bowel sounds active all four quadrants,    no masses, no organomegaly  Genitalia:    Normal female without lesion, discharge or tenderness  Rectal:    Normal tone, normal prostate, no masses or tenderness;   guaiac negative stool  Extremities:   Extremities normal, atraumatic, no cyanosis or edema  Pulses:   2+ and symmetric all extremities  Skin:   Skin color, texture, turgor normal, no rashes or lesions  Lymph nodes:   Cervical, supraclavicular, and axillary nodes normal  Neurologic:   CNII-XII intact, normal strength, sensation and reflexes    throughout       Assessment:      This is a routine physical examination for this healthy  Female. Reviewed all health maintenance protocols including mammography colonoscopy  bone density and reviewed appropriate screening labs. Her immunization history was reviewed as well as her current medications and allergies refills of her chronic medications were given and the plan for yearly health maintenance was discussed all orders and referrals were made as appropriate.      Plan:     During the course of the visit the patient was educated and counseled about appropriate screening and preventive services including:    Pneumococcal vaccine   Influenza vaccine  Td vaccine  Bone  densitometry screening  Diet review for nutrition referral? Yes ____  Not Indicated x____   Patient Instructions (the written plan) was given to the patient.  Medicare Attestation I have personally reviewed: The patient's medical and social history Their use of alcohol, tobacco or illicit drugs Their current medications and supplements The patient's functional ability including ADLs,fall risks, home safety risks, cognitive, and hearing and visual impairment Diet and physical activities Evidence for depression or mood disorders  The patient's weight, height, BMI, and visual acuity have been recorded in the chart.  I have made referrals, counseling, and provided education to the patient based on review of the above and I have provided the patient with a written personalized care plan for preventive services.     Carrie Mew, MD   08/23/2012

## 2012-08-23 NOTE — Progress Notes (Addendum)
  Subjective:    Patient ID: Kathleen Thompson, female    DOB: May 27, 1934, 79 y.o.   MRN: [id number]  HPI Patient presents for Medicare wellness examination also followed for history of coronary artery disease hypertension and history of GERD with a history of esophageal stricture and a history of hyperlipidemia.  Appropriate screening labs were drawn today to address his chronic problems as well as a Medicare wellness examination was accomplished   Review of Systems  Constitutional: Negative for activity change, appetite change and fatigue.  HENT: Negative for ear pain, congestion, neck pain, postnasal drip and sinus pressure.   Eyes: Negative for redness and visual disturbance.  Respiratory: Negative for cough, shortness of breath and wheezing.   Gastrointestinal: Negative for abdominal pain and abdominal distention.  Genitourinary: Negative for dysuria, frequency and menstrual problem.  Musculoskeletal: Negative for myalgias, joint swelling and arthralgias.  Skin: Negative for rash and wound.  Neurological: Negative for dizziness, weakness and headaches.  Hematological: Negative for adenopathy. Does not bruise/bleed easily.  Psychiatric/Behavioral: Negative for sleep disturbance and decreased concentration.       Objective:   Physical Exam  Constitutional: She is oriented to person, place, and time. She appears well-developed and well-nourished. No distress.  HENT:  Head: Normocephalic and atraumatic.  Right Ear: External ear normal.  Left Ear: External ear normal.  Nose: Nose normal.  Mouth/Throat: Oropharynx is clear and moist.  Eyes: Conjunctivae normal and EOM are normal. Pupils are equal, round, and reactive to light.  Neck: Normal range of motion. Neck supple. No JVD present. No tracheal deviation present. No thyromegaly present.  Cardiovascular: Normal rate, regular rhythm and intact distal pulses.   Murmur heard. Pulmonary/Chest: Effort normal and breath sounds normal. She  has no wheezes. She exhibits no tenderness.  Abdominal: Soft. Bowel sounds are normal.  Musculoskeletal: Normal range of motion. She exhibits no edema and no tenderness.  Lymphadenopathy:    She has no cervical adenopathy.  Neurological: She is alert and oriented to person, place, and time. She has normal reflexes. No cranial nerve deficit.  Skin: Skin is warm and dry. She is not diaphoretic.  Psychiatric: She has a normal mood and affect. Her behavior is normal.          Assessment & Plan:  Monitoring of lipids on the therapy will be an LDL less than 100.  Hypertension is at goal.  No evidence of CAD out of control.  Dysphasia is stable on current medications.

## 2012-10-13 ENCOUNTER — Encounter: Payer: Self-pay | Admitting: Internal Medicine

## 2012-10-13 ENCOUNTER — Ambulatory Visit (INDEPENDENT_AMBULATORY_CARE_PROVIDER_SITE_OTHER): Payer: PRIVATE HEALTH INSURANCE | Admitting: Internal Medicine

## 2012-10-13 VITALS — BP 120/70 | HR 72 | Temp 99.1°F | Resp 16 | Ht 62.0 in | Wt 146.0 lb

## 2012-10-13 DIAGNOSIS — J45909 Unspecified asthma, uncomplicated: Secondary | ICD-10-CM

## 2012-10-13 MED ORDER — LEVOTHYROXINE SODIUM 75 MCG PO TABS: 75.0000 ug | ORAL_TABLET | Freq: Every day | ORAL | Status: DC

## 2012-10-13 MED ORDER — MONTELUKAST SODIUM 10 MG PO TABS: 10.0000 mg | ORAL_TABLET | Freq: Every day | ORAL | Status: DC

## 2012-10-13 NOTE — Progress Notes (Signed)
Subjective:    Patient ID: Kathleen Thompson, female    DOB: 06/21/1934, 79 y.o.   MRN: [id number]  HPI  Patient is a 79 year old female who is followed for hypertension hyperlipidemia with a history of coronary disease.  She has done well since her last office visit.  Her blood pressure is stable her weight is stable she's been exercising on a regular basis and has not had to take a diuretic to control her fluids   Review of Systems  Constitutional: Negative for activity change, appetite change and fatigue.  HENT: Negative for ear pain, congestion, neck pain, postnasal drip and sinus pressure.   Eyes: Negative for redness and visual disturbance.  Respiratory: Negative for cough, shortness of breath and wheezing.   Gastrointestinal: Negative for abdominal pain and abdominal distention.  Genitourinary: Negative for dysuria, frequency and menstrual problem.  Musculoskeletal: Negative for myalgias, joint swelling and arthralgias.  Skin: Negative for rash and wound.  Neurological: Negative for dizziness, weakness and headaches.  Hematological: Negative for adenopathy. Does not bruise/bleed easily.  Psychiatric/Behavioral: Negative for sleep disturbance and decreased concentration.   Past Medical History  Diagnosis Date  . Internal hemorrhoid   . Diverticulosis   . Gastroparesis   . GERD (gastroesophageal reflux disease)   . Adenomatous colon polyp 02/2003  . Cellulitis and abscess of hand   . Hyperlipidemia   . PVD (peripheral vascular disease)   . Hypothyroidism   . Hypertension   . CAD (coronary artery disease)   . Iron deficiency anemia   . Esophageal stricture   . Arthritis   . Blood transfusion   . Cataract   . Osteoporosis     History   Social History  . Marital Status: Married    Spouse Name: N/A    Number of Children: N/A  . Years of Education: N/A   Occupational History  . Retired    Social History Main Topics  . Smoking status: Never Smoker   . Smokeless  tobacco: Never Used  . Alcohol Use: No  . Drug Use: No  . Sexually Active: Yes   Other Topics Concern  . Not on file   Social History Narrative  . No narrative on file    Past Surgical History  Procedure Laterality Date  . Appendectomy    . Abdominal hysterectomy    . Cervical fusion    . Shoulder surgery       Family History  Problem Relation Age of Onset  . Colon cancer Brother     at age 65  . Colon cancer Sister     at age 58  . Diabetes Mother   . Diabetes Maternal Aunt   . Diabetes Maternal Uncle     Allergies  Allergen Reactions  . Rofecoxib   . Latex Rash    Pt states that latex causes blisters to skin    Current Outpatient Prescriptions on File Prior to Visit  Medication Sig Dispense Refill  . atorvastatin (LIPITOR) 40 MG tablet Take 1 tablet (40 mg total) by mouth daily.  90 tablet  3  . calcium carbonate 200 MG capsule Take 250 mg by mouth 2 (two) times daily with a meal.        . carvedilol (COREG) 6.25 MG tablet Take 1 tablet (6.25 mg total) by mouth 2 (two) times daily.  180 tablet  3  . clopidogrel (PLAVIX) 75 MG tablet Take 1 tablet (75 mg total) by mouth daily.  90 tablet  3  .  co-enzyme Q-10 30 MG capsule Take 10 mg by mouth daily.       . furosemide (LASIX) 20 MG tablet Take 20 mg by mouth daily as needed.      Marland Kitchen losartan-hydrochlorothiazide (HYZAAR) 100-12.5 MG per tablet 1/2 tab bid  90 tablet  3  . magnesium 30 MG tablet Take 30 mg by mouth daily.        Marland Kitchen PAPAYA PO Take by mouth 3 x daily with food.      . Probiotic Product (ALIGN) 4 MG CAPS Take 1 capsule by mouth daily.  30 capsule  0  . traMADol (ULTRAM) 50 MG tablet Take 1 tablet (50 mg total) by mouth every 6 (six) hours as needed for pain.  180 tablet  2  . vitamin D, CHOLECALCIFEROL, 400 UNITS tablet Take 400 Units by mouth daily.         No current facility-administered medications on file prior to visit.    BP 120/70  Pulse 72  Temp(Src) 99.1 F (37.3 C)  Resp 16  Ht 5\' 2"   (1.575 m)  Wt 146 lb (66.225 kg)  BMI 26.7 kg/m2        Objective:   Physical Exam  Nursing note and vitals reviewed. Constitutional: She is oriented to person, place, and time. She appears well-developed and well-nourished. No distress.  HENT:  Head: Normocephalic and atraumatic.  Right Ear: External ear normal.  Left Ear: External ear normal.  Nose: Nose normal.  Mouth/Throat: Oropharynx is clear and moist.  Eyes: Conjunctivae and EOM are normal. Pupils are equal, round, and reactive to light.  Neck: Normal range of motion. Neck supple. No JVD present. No tracheal deviation present. No thyromegaly present.  Cardiovascular: Normal rate, regular rhythm and intact distal pulses.   Murmur heard. Pulmonary/Chest: Effort normal and breath sounds normal. She has no wheezes. She exhibits no tenderness.  Abdominal: Soft. Bowel sounds are normal.  Musculoskeletal: Normal range of motion. She exhibits no edema and no tenderness.  Lymphadenopathy:    She has no cervical adenopathy.  Neurological: She is alert and oriented to person, place, and time. She has normal reflexes. No cranial nerve deficit.  Skin: Skin is warm and dry. She is not diaphoretic.  Psychiatric: She has a normal mood and affect. Her behavior is normal.          Assessment & Plan:  Stable blood pressure Mild exertional COPD/ SOB not at rest Discussed the need for a   Maintenance inhaler Discussed a trial of singular?

## 2012-10-13 NOTE — Patient Instructions (Signed)
We are adding Singulair for her asthma If you do not note an improvement in your breathing can stop it after 30 days if you notice an improvement continue the medication and to see me back

## 2012-12-18 ENCOUNTER — Emergency Department (HOSPITAL_BASED_OUTPATIENT_CLINIC_OR_DEPARTMENT_OTHER): Payer: Medicare Other

## 2012-12-18 ENCOUNTER — Observation Stay (HOSPITAL_BASED_OUTPATIENT_CLINIC_OR_DEPARTMENT_OTHER)
Admission: EM | Admit: 2012-12-18 | Discharge: 2012-12-21 | Disposition: A | Discharge status: Home / Self Care | Payer: Medicare Other | Attending: Internal Medicine | Admitting: Internal Medicine

## 2012-12-18 ENCOUNTER — Encounter (HOSPITAL_BASED_OUTPATIENT_CLINIC_OR_DEPARTMENT_OTHER): Payer: Self-pay | Admitting: Emergency Medicine

## 2012-12-18 DIAGNOSIS — R55 Syncope and collapse: Secondary | ICD-10-CM

## 2012-12-18 DIAGNOSIS — I10 Essential (primary) hypertension: Secondary | ICD-10-CM

## 2012-12-18 DIAGNOSIS — Z860100 Personal history of colon polyps, unspecified: Secondary | ICD-10-CM

## 2012-12-18 DIAGNOSIS — D126 Benign neoplasm of colon, unspecified: Secondary | ICD-10-CM

## 2012-12-18 DIAGNOSIS — L02519 Cutaneous abscess of unspecified hand: Secondary | ICD-10-CM

## 2012-12-18 DIAGNOSIS — I2510 Atherosclerotic heart disease of native coronary artery without angina pectoris: Secondary | ICD-10-CM

## 2012-12-18 DIAGNOSIS — I739 Peripheral vascular disease, unspecified: Secondary | ICD-10-CM

## 2012-12-18 DIAGNOSIS — R059 Cough, unspecified: Secondary | ICD-10-CM

## 2012-12-18 DIAGNOSIS — Z800 Family history of malignant neoplasm of digestive organs: Secondary | ICD-10-CM

## 2012-12-18 DIAGNOSIS — J4489 Other specified chronic obstructive pulmonary disease: Secondary | ICD-10-CM

## 2012-12-18 DIAGNOSIS — R002 Palpitations: Secondary | ICD-10-CM

## 2012-12-18 DIAGNOSIS — Z7902 Long term (current) use of antithrombotics/antiplatelets: Secondary | ICD-10-CM | POA: Insufficient documentation

## 2012-12-18 DIAGNOSIS — Z79899 Other long term (current) drug therapy: Secondary | ICD-10-CM | POA: Insufficient documentation

## 2012-12-18 DIAGNOSIS — E039 Hypothyroidism, unspecified: Secondary | ICD-10-CM

## 2012-12-18 DIAGNOSIS — K222 Esophageal obstruction: Secondary | ICD-10-CM

## 2012-12-18 DIAGNOSIS — L03119 Cellulitis of unspecified part of limb: Secondary | ICD-10-CM

## 2012-12-18 DIAGNOSIS — J449 Chronic obstructive pulmonary disease, unspecified: Secondary | ICD-10-CM

## 2012-12-18 DIAGNOSIS — R51 Headache: Secondary | ICD-10-CM

## 2012-12-18 DIAGNOSIS — E785 Hyperlipidemia, unspecified: Secondary | ICD-10-CM

## 2012-12-18 DIAGNOSIS — R0602 Shortness of breath: Secondary | ICD-10-CM | POA: Insufficient documentation

## 2012-12-18 DIAGNOSIS — R911 Solitary pulmonary nodule: Secondary | ICD-10-CM

## 2012-12-18 DIAGNOSIS — R05 Cough: Secondary | ICD-10-CM

## 2012-12-18 DIAGNOSIS — K5730 Diverticulosis of large intestine without perforation or abscess without bleeding: Secondary | ICD-10-CM

## 2012-12-18 DIAGNOSIS — R110 Nausea: Secondary | ICD-10-CM | POA: Insufficient documentation

## 2012-12-18 DIAGNOSIS — K3184 Gastroparesis: Secondary | ICD-10-CM

## 2012-12-18 DIAGNOSIS — K648 Other hemorrhoids: Secondary | ICD-10-CM

## 2012-12-18 DIAGNOSIS — K219 Gastro-esophageal reflux disease without esophagitis: Secondary | ICD-10-CM

## 2012-12-18 DIAGNOSIS — R1319 Other dysphagia: Secondary | ICD-10-CM

## 2012-12-18 DIAGNOSIS — R079 Chest pain, unspecified: Principal | ICD-10-CM

## 2012-12-18 DIAGNOSIS — T887XXA Unspecified adverse effect of drug or medicament, initial encounter: Secondary | ICD-10-CM

## 2012-12-18 DIAGNOSIS — J45991 Cough variant asthma: Secondary | ICD-10-CM

## 2012-12-18 DIAGNOSIS — R0989 Other specified symptoms and signs involving the circulatory and respiratory systems: Secondary | ICD-10-CM

## 2012-12-18 HISTORY — DX: Transient cerebral ischemic attack, unspecified: G45.9

## 2012-12-18 LAB — CBC
MCHC: 35.4 g/dL (ref 30.0–36.0)
Platelets: 220 10*3/uL (ref 150–400)
RDW: 12.6 % (ref 11.5–15.5)

## 2012-12-18 LAB — BASIC METABOLIC PANEL
BUN: 14 mg/dL (ref 6–23)
GFR calc Af Amer: 62 mL/min — ABNORMAL LOW (ref >90)
GFR calc non Af Amer: 53 mL/min — ABNORMAL LOW (ref >90)
Potassium: 3.5 mEq/L (ref 3.5–5.1)
Sodium: 138 mEq/L (ref 135–145)

## 2012-12-18 LAB — PRO B NATRIURETIC PEPTIDE: Pro B Natriuretic peptide (BNP): 152.6 pg/mL (ref 0–450)

## 2012-12-18 LAB — TROPONIN I: Troponin I: <0.3 ng/mL (ref <0.30)

## 2012-12-18 NOTE — ED Notes (Signed)
Pt reports "I didn't feel good all day" then chest pressure "a real heaviness" in mid chest that radiates "burning" to her mid back. Pt denies previous episodes. On Plavix for TIAs in past.

## 2012-12-18 NOTE — ED Provider Notes (Signed)
History    This chart was scribed for Hanley Seamen, MD by Leone Payor, ED Scribe. This patient was seen in room MH10/MH10 and the patient's care was started 10:42 PM.   CSN: [id number]  Arrival date & time 12/18/12  2137   None     Chief Complaint  Patient presents with  . Chest Pain    The history is provided by the patient. No language interpreter was used.    HPI Comments: Kathleen Thompson is a 79 y.o. female who presents to the Emergency Department complaining of 3 hours of intermittent, mid chest pressure that radiates to mid back.  Rates it as a 4-5/10 max. She has SOB that has been ongoing for 3 months. The SOB is not associated with the chest pain. She has also had some dizziness and lightheadedness that started today. She denies nausea, vomiting, cough, fever, chills. Pt denies smoking and alcohol use. The chest pain came on at rest and is not exacerbated or mitigating by any known factor.    Past Medical History  Diagnosis Date  . Internal hemorrhoid   . Diverticulosis   . Gastroparesis   . GERD (gastroesophageal reflux disease)   . Adenomatous colon polyp 02/2003  . Cellulitis and abscess of hand   . Hyperlipidemia   . PVD (peripheral vascular disease)   . Hypothyroidism   . Hypertension   . CAD (coronary artery disease)   . Iron deficiency anemia   . Esophageal stricture   . Arthritis   . Blood transfusion   . Cataract   . Osteoporosis     Past Surgical History  Procedure Laterality Date  . Appendectomy    . Abdominal hysterectomy    . Cervical fusion    . Shoulder surgery       Family History  Problem Relation Age of Onset  . Colon cancer Brother     at age 66  . Colon cancer Sister     at age 48  . Diabetes Mother   . Diabetes Maternal Aunt   . Diabetes Maternal Uncle     History  Substance Use Topics  . Smoking status: Never Smoker   . Smokeless tobacco: Never Used  . Alcohol Use: No    No OB history provided.   Review of Systems A  complete 10 system review of systems was obtained and all systems are negative except as noted in the HPI and PMH.   Allergies  Rofecoxib and Latex  Home Medications   Current Outpatient Rx  Name  Route  Sig  Dispense  Refill  . aspirin 81 MG tablet   Oral   Take 81 mg by mouth daily.         Marland Kitchen atorvastatin (LIPITOR) 40 MG tablet   Oral   Take 1 tablet (40 mg total) by mouth daily.   90 tablet   3   . calcium carbonate 200 MG capsule   Oral   Take 250 mg by mouth 2 (two) times daily with a meal.           . carvedilol (COREG) 6.25 MG tablet   Oral   Take 1 tablet (6.25 mg total) by mouth 2 (two) times daily.   180 tablet   3   . clopidogrel (PLAVIX) 75 MG tablet   Oral   Take 1 tablet (75 mg total) by mouth daily.   90 tablet   3   . co-enzyme Q-10 30 MG capsule  Oral   Take 10 mg by mouth daily.          Marland Kitchen EXPIRED: furosemide (LASIX) 20 MG tablet   Oral   Take 20 mg by mouth daily as needed.         Marland Kitchen levothyroxine (SYNTHROID, LEVOTHROID) 75 MCG tablet   Oral   Take 1 tablet (75 mcg total) by mouth daily.   90 tablet   3   . losartan-hydrochlorothiazide (HYZAAR) 100-12.5 MG per tablet      1/2 tab bid   90 tablet   3   . magnesium 30 MG tablet   Oral   Take 30 mg by mouth daily.           . montelukast (SINGULAIR) 10 MG tablet   Oral   Take 1 tablet (10 mg total) by mouth at bedtime.   30 tablet   3   . PAPAYA PO   Oral   Take by mouth 3 x daily with food.         . Probiotic Product (ALIGN) 4 MG CAPS   Oral   Take 1 capsule by mouth daily.   30 capsule   0     Samples given to patient    ZOX#[id number] A1         ...   . traMADol (ULTRAM) 50 MG tablet   Oral   Take 1 tablet (50 mg total) by mouth every 6 (six) hours as needed for pain.   180 tablet   2   . vitamin D, CHOLECALCIFEROL, 400 UNITS tablet   Oral   Take 400 Units by mouth daily.             BP 149/76  Pulse 73  Ht 5\' 4"  (1.626 m)  Wt 140 lb (63.504  kg)  BMI 24.02 kg/m2  SpO2 100%  Physical Exam  Nursing note and vitals reviewed. Constitutional: She is oriented to person, place, and time. She appears well-developed and well-nourished. No distress.  HENT:  Head: Normocephalic and atraumatic.  Eyes: EOM are normal. Pupils are equal, round, and reactive to light.  Neck: Neck supple. No tracheal deviation present.  Cardiovascular: Normal rate, regular rhythm, normal heart sounds and intact distal pulses.   Pulmonary/Chest: Effort normal and breath sounds normal. No respiratory distress. She has no wheezes. She has no rales.  Abdominal: Soft. Bowel sounds are normal. She exhibits no distension and no mass. There is no tenderness.  Musculoskeletal: Normal range of motion.  Neurological: She is alert and oriented to person, place, and time.  Skin: Skin is warm and dry.  Psychiatric: She has a normal mood and affect. Her behavior is normal.    ED Course  Procedures (including critical care time)  DIAGNOSTIC STUDIES: Oxygen Saturation is 100% on room air, normal by my interpretation.    COORDINATION OF CARE: 11:11 PM Discussed treatment plan with pt at bedside and pt agreed to plan.     MDM   Nursing notes and vitals signs, including pulse oximetry, reviewed.  Summary of this visit's results, reviewed by myself:  Labs:  Results for orders placed during the hospital encounter of 12/18/12 (from the past 24 hour(s))  CBC     Status: None   Collection Time    12/18/12 10:24 PM      Result Value Range   WBC 6.9  4.0 - 10.5 K/uL   RBC 4.15  3.87 - 5.11 MIL/uL   Hemoglobin 13.4  12.0 -  15.0 g/dL   HCT 95.6  21.3 - 08.6 %   MCV 91.1  78.0 - 100.0 fL   MCH 32.3  26.0 - 34.0 pg   MCHC 35.4  30.0 - 36.0 g/dL   RDW 57.8  46.9 - 62.9 %   Platelets 220  150 - 400 K/uL  BASIC METABOLIC PANEL     Status: Abnormal   Collection Time    12/18/12 10:24 PM      Result Value Range   Sodium 138  135 - 145 mEq/L   Potassium 3.5  3.5 -  5.1 mEq/L   Chloride 100  96 - 112 mEq/L   CO2 27  19 - 32 mEq/L   Glucose, Bld 107 (*) 70 - 99 mg/dL   BUN 14  6 - 23 mg/dL   Creatinine, Ser 5.28  0.50 - 1.10 mg/dL   Calcium 41.3  8.4 - 24.4 mg/dL   GFR calc non Af Amer 53 (*) >90 mL/min   GFR calc Af Amer 62 (*) >90 mL/min  TROPONIN I     Status: None   Collection Time    12/18/12 10:24 PM      Result Value Range   Troponin I <0.30  <0.30 ng/mL  PRO B NATRIURETIC PEPTIDE     Status: None   Collection Time    12/18/12 10:24 PM      Result Value Range   Pro B Natriuretic peptide (BNP) 152.6  0 - 450 pg/mL    Imaging Studies: Dg Chest 2 View  12/19/2012  *RADIOLOGY REPORT*  Clinical Data:  Chest pressure for 1 day, dizziness, hypertension  CHEST - 2 VIEW  Comparison: 12/19/2011  Findings: Normal heart size and pulmonary vascularity. Calcified tortuous aorta. Lungs appear emphysematous but clear. No pleural effusion or pneumothorax. Question new 12 x 8 mm right lobe nodule. Bones demineralized.  IMPRESSION: Emphysematous changes without acute infiltrate. Question new 12 x 8 mm diameter right upper lobe nodule; CT chest recommended to exclude pulmonary nodule.   Original Report Authenticated By: Ulyses Southward, M.D.     EKG Interpretation:  Date & Time: 12/18/2012 9:56 PM  Rate: 76  Rhythm: normal sinus rhythm  QRS Axis: normal  Intervals: normal  ST/T Wave abnormalities: normal  Conduction Disutrbances:none  Narrative Interpretation:   Old EKG Reviewed: none available  12:59 AM Patient is pain free but states she still feels a flutter in her chest from time to time. Her waveform for the past 30 minutes was reviewed and no arrhythmia or ectopy was seen.       I personally performed the services described in this documentation, which was scribed in my presence.  The recorded information has been reviewed is accurate.     Hanley Seamen, MD 12/19/12 [phone]

## 2012-12-19 ENCOUNTER — Encounter (HOSPITAL_COMMUNITY): Payer: Self-pay | Admitting: *Deleted

## 2012-12-19 ENCOUNTER — Observation Stay (HOSPITAL_COMMUNITY): Payer: Medicare Other

## 2012-12-19 DIAGNOSIS — R911 Solitary pulmonary nodule: Secondary | ICD-10-CM | POA: Diagnosis present

## 2012-12-19 DIAGNOSIS — E785 Hyperlipidemia, unspecified: Secondary | ICD-10-CM

## 2012-12-19 DIAGNOSIS — I10 Essential (primary) hypertension: Secondary | ICD-10-CM

## 2012-12-19 DIAGNOSIS — E039 Hypothyroidism, unspecified: Secondary | ICD-10-CM

## 2012-12-19 DIAGNOSIS — R079 Chest pain, unspecified: Principal | ICD-10-CM | POA: Diagnosis present

## 2012-12-19 LAB — TROPONIN I
Troponin I: <0.3 ng/mL (ref <0.30)
Troponin I: <0.3 ng/mL (ref <0.30)

## 2012-12-19 LAB — PROTIME-INR: INR: 1.08 (ref 0.00–1.49)

## 2012-12-19 MED ORDER — CHOLECALCIFEROL 10 MCG (400 UNIT) PO TABS
400.0000 [IU] | ORAL_TABLET | Freq: Every day | ORAL | Status: DC
  Administered 2012-12-19 – 2012-12-21 (×3): 400 [IU] via ORAL
  Filled 2012-12-19 (×3): qty 1

## 2012-12-19 MED ORDER — ATORVASTATIN CALCIUM 40 MG PO TABS
40.0000 mg | ORAL_TABLET | Freq: Every day | ORAL | Status: DC
  Administered 2012-12-19 – 2012-12-21 (×3): 40 mg via ORAL
  Filled 2012-12-19 (×4): qty 1

## 2012-12-19 MED ORDER — ACETAMINOPHEN 650 MG RE SUPP: 650.0000 mg | Freq: Four times a day (QID) | RECTAL | Status: DC | PRN

## 2012-12-19 MED ORDER — OXYCODONE HCL 5 MG PO TABS: 5.0000 mg | ORAL_TABLET | ORAL | Status: DC | PRN

## 2012-12-19 MED ORDER — ASPIRIN 325 MG PO TABS
325.0000 mg | ORAL_TABLET | Freq: Every day | ORAL | Status: DC
  Administered 2012-12-19: 325 mg via ORAL
  Filled 2012-12-19: qty 1

## 2012-12-19 MED ORDER — HYDROCHLOROTHIAZIDE 12.5 MG PO CAPS
12.5000 mg | ORAL_CAPSULE | Freq: Every day | ORAL | Status: DC
  Administered 2012-12-19: 12.5 mg via ORAL
  Filled 2012-12-19 (×3): qty 1

## 2012-12-19 MED ORDER — NITROGLYCERIN 2 % TD OINT
0.5000 [in_us] | TOPICAL_OINTMENT | Freq: Four times a day (QID) | TRANSDERMAL | Status: DC
  Administered 2012-12-19 (×3): 0.5 [in_us] via TOPICAL
  Filled 2012-12-19: qty 30

## 2012-12-19 MED ORDER — SODIUM CHLORIDE 0.9 % IJ SOLN
3.0000 mL | Freq: Two times a day (BID) | INTRAMUSCULAR | Status: DC
  Administered 2012-12-19 (×2): 3 mL via INTRAVENOUS

## 2012-12-19 MED ORDER — CARVEDILOL 6.25 MG PO TABS
6.2500 mg | ORAL_TABLET | Freq: Two times a day (BID) | ORAL | Status: DC
  Administered 2012-12-19 – 2012-12-21 (×5): 6.25 mg via ORAL
  Filled 2012-12-19 (×7): qty 1

## 2012-12-19 MED ORDER — FUROSEMIDE 20 MG PO TABS
20.0000 mg | ORAL_TABLET | Freq: Every day | ORAL | Status: DC | PRN
  Filled 2012-12-19: qty 1

## 2012-12-19 MED ORDER — MONTELUKAST SODIUM 10 MG PO TABS
10.0000 mg | ORAL_TABLET | Freq: Every day | ORAL | Status: DC
  Filled 2012-12-19 (×2): qty 1

## 2012-12-19 MED ORDER — LOSARTAN POTASSIUM-HCTZ 100-12.5 MG PO TABS: 1.0000 | ORAL_TABLET | Freq: Every day | ORAL | Status: DC

## 2012-12-19 MED ORDER — TRAMADOL HCL 50 MG PO TABS
50.0000 mg | ORAL_TABLET | Freq: Four times a day (QID) | ORAL | Status: DC | PRN
  Administered 2012-12-19 – 2012-12-20 (×3): 50 mg via ORAL
  Filled 2012-12-19 (×3): qty 1

## 2012-12-19 MED ORDER — ONDANSETRON HCL 4 MG PO TABS: 4.0000 mg | ORAL_TABLET | Freq: Four times a day (QID) | ORAL | Status: DC | PRN

## 2012-12-19 MED ORDER — CALCIUM CARBONATE 200 MG PO CAPS: 250.0000 mg | ORAL_CAPSULE | Freq: Two times a day (BID) | ORAL | Status: DC

## 2012-12-19 MED ORDER — ALUM & MAG HYDROXIDE-SIMETH 200-200-20 MG/5ML PO SUSP: 30.0000 mL | Freq: Four times a day (QID) | ORAL | Status: DC | PRN

## 2012-12-19 MED ORDER — PANTOPRAZOLE SODIUM 40 MG PO TBEC
40.0000 mg | DELAYED_RELEASE_TABLET | Freq: Every day | ORAL | Status: DC
  Administered 2012-12-19 – 2012-12-21 (×3): 40 mg via ORAL
  Filled 2012-12-19 (×3): qty 1

## 2012-12-19 MED ORDER — ACETAMINOPHEN 325 MG PO TABS
650.0000 mg | ORAL_TABLET | Freq: Four times a day (QID) | ORAL | Status: DC | PRN
  Administered 2012-12-19: 650 mg via ORAL
  Filled 2012-12-19: qty 2

## 2012-12-19 MED ORDER — SODIUM CHLORIDE 0.9 % IV SOLN
INTRAVENOUS | Status: DC
  Administered 2012-12-20: 04:00:00 via INTRAVENOUS

## 2012-12-19 MED ORDER — COENZYME Q10 30 MG PO CAPS: 30.0000 mg | ORAL_CAPSULE | Freq: Every day | ORAL | Status: DC

## 2012-12-19 MED ORDER — SODIUM CHLORIDE 0.9 % IV SOLN: 250.0000 mL | INTRAVENOUS | Status: DC | PRN

## 2012-12-19 MED ORDER — ENOXAPARIN SODIUM 40 MG/0.4ML ~~LOC~~ SOLN
40.0000 mg | SUBCUTANEOUS | Status: DC
  Administered 2012-12-19 – 2012-12-20 (×2): 40 mg via SUBCUTANEOUS
  Filled 2012-12-19 (×4): qty 0.4

## 2012-12-19 MED ORDER — CALCIUM CARBONATE ANTACID 500 MG PO CHEW
1.0000 | CHEWABLE_TABLET | Freq: Two times a day (BID) | ORAL | Status: DC
  Administered 2012-12-19 – 2012-12-21 (×3): 200 mg via ORAL
  Filled 2012-12-19 (×7): qty 1

## 2012-12-19 MED ORDER — RISAQUAD PO CAPS
1.0000 | ORAL_CAPSULE | Freq: Every day | ORAL | Status: DC
  Administered 2012-12-19 – 2012-12-21 (×3): 1 via ORAL
  Filled 2012-12-19 (×3): qty 1

## 2012-12-19 MED ORDER — ASPIRIN EC 81 MG PO TBEC
81.0000 mg | DELAYED_RELEASE_TABLET | Freq: Every day | ORAL | Status: DC
  Administered 2012-12-21: 81 mg via ORAL
  Filled 2012-12-19 (×3): qty 1

## 2012-12-19 MED ORDER — ASPIRIN 81 MG PO CHEW
324.0000 mg | CHEWABLE_TABLET | ORAL | Status: AC
  Administered 2012-12-20: 324 mg via ORAL
  Filled 2012-12-19: qty 4

## 2012-12-19 MED ORDER — IOHEXOL 300 MG/ML  SOLN
80.0000 mL | Freq: Once | INTRAMUSCULAR | Status: AC | PRN
  Administered 2012-12-19: 80 mL via INTRAVENOUS

## 2012-12-19 MED ORDER — HYDROMORPHONE HCL PF 1 MG/ML IJ SOLN: 0.5000 mg | INTRAMUSCULAR | Status: DC | PRN

## 2012-12-19 MED ORDER — LOSARTAN POTASSIUM 50 MG PO TABS
100.0000 mg | ORAL_TABLET | Freq: Every day | ORAL | Status: DC
  Administered 2012-12-19 – 2012-12-20 (×2): 100 mg via ORAL
  Filled 2012-12-19 (×3): qty 2

## 2012-12-19 MED ORDER — SODIUM CHLORIDE 0.9 % IV SOLN
INTRAVENOUS | Status: DC
  Administered 2012-12-19: 06:00:00 via INTRAVENOUS

## 2012-12-19 MED ORDER — ONDANSETRON HCL 4 MG/2ML IJ SOLN: 4.0000 mg | Freq: Four times a day (QID) | INTRAMUSCULAR | Status: DC | PRN

## 2012-12-19 MED ORDER — ZOLPIDEM TARTRATE 5 MG PO TABS: 5.0000 mg | ORAL_TABLET | Freq: Every evening | ORAL | Status: DC | PRN

## 2012-12-19 MED ORDER — CLOPIDOGREL BISULFATE 75 MG PO TABS
75.0000 mg | ORAL_TABLET | Freq: Every day | ORAL | Status: DC
  Administered 2012-12-19 – 2012-12-21 (×3): 75 mg via ORAL
  Filled 2012-12-19 (×3): qty 1

## 2012-12-19 MED ORDER — LEVOTHYROXINE SODIUM 75 MCG PO TABS
75.0000 ug | ORAL_TABLET | Freq: Every day | ORAL | Status: DC
  Administered 2012-12-19 – 2012-12-21 (×3): 75 ug via ORAL
  Filled 2012-12-19 (×4): qty 1

## 2012-12-19 MED ORDER — MAGNESIUM 30 MG PO TABS: 30.0000 mg | ORAL_TABLET | Freq: Every day | ORAL | Status: DC

## 2012-12-19 MED ORDER — SODIUM CHLORIDE 0.9 % IJ SOLN
3.0000 mL | INTRAMUSCULAR | Status: DC | PRN
  Administered 2012-12-19: 3 mL via INTRAVENOUS

## 2012-12-19 MED ORDER — ALIGN 4 MG PO CAPS: 1.0000 | ORAL_CAPSULE | Freq: Every day | ORAL | Status: DC

## 2012-12-19 NOTE — Progress Notes (Signed)
Patient ID: Kathleen Thompson  female  QMV:[id number]    DOB: November 15, 1933    DOA: 12/18/2012  PCP: Carrie Mew, MD  Patient admitted by Dr. Lovell Sheehan this morning, patient examined, H&P reviewed, agree with the assessment and plan  Assessment/Plan: Principal Problem:   Chest pain with intermittent dyspnea for last 3 months, worsened in the last 2 days - 2 sets of cardiac enzymes negative, BNP 152.6, last stress echo was 2009 was normal, EF 55-65% - [facility] cardiology consulted, order 2-D echo for further workup, will defer to cardiology for stress test given intermittent chest pains - Has a history of GERD, placed on PPI as well  Active Problems:   HYPOTHYROIDISM    HYPERLIPIDEMIA: On statins    HYPERTENSION- currently stable    CORONARY ARTERY DISEASE - Continue aspirin, Plavix, statin, Coreg, Cozaar    Lung nodule, solitary - CT chest pending   DVT Prophylaxis:  Code Status:  Disposition: Hopefully tomorrow    Subjective: States chest pain is better today  Objective: Weight change:  No intake or output data in the 24 hours ending 12/19/12 1001 Blood pressure 105/48, pulse 69, temperature 97.9 F (36.6 C), resp. rate 18, height 5\' 2"  (1.575 m), weight 62.687 kg (138 lb 3.2 oz), SpO2 98.00%.  Physical Exam: General: Alert and awake, oriented x3, not in any acute distress. CVS: S1-S2 clear, no murmur rubs or gallops Chest: clear to auscultation bilaterally, no wheezing, rales or rhonchi Abdomen: soft nontender, nondistended, normal bowel sounds Extremities: no cyanosis, clubbing or edema noted bilaterally   Lab Results: Basic Metabolic Panel:  Recent Labs Lab 12/18/12 2224  NA 138  K 3.5  CL 100  CO2 27  GLUCOSE 107*  BUN 14  CREATININE 1.00  CALCIUM 10.0   Liver Function Tests: No results found for this basename: AST, ALT, ALKPHOS, BILITOT, PROT, ALBUMIN,  in the last 168 hours No results found for this basename: LIPASE, AMYLASE,  in the last 168  hours No results found for this basename: AMMONIA,  in the last 168 hours CBC:  Recent Labs Lab 12/18/12 2224  WBC 6.9  HGB 13.4  HCT 37.8  MCV 91.1  PLT 220   Cardiac Enzymes:  Recent Labs Lab 12/18/12 2224 12/19/12 0750  TROPONINI <0.30 <0.30   BNP: No components found with this basename: POCBNP,  CBG: No results found for this basename: GLUCAP,  in the last 168 hours   Micro Results: No results found for this or any previous visit (from the past 240 hour(s)).  Studies/Results: Dg Chest 2 View  12/19/2012  *RADIOLOGY REPORT*  Clinical Data:  Chest pressure for 1 day, dizziness, hypertension  CHEST - 2 VIEW  Comparison: 12/19/2011  Findings: Normal heart size and pulmonary vascularity. Calcified tortuous aorta. Lungs appear emphysematous but clear. No pleural effusion or pneumothorax. Question new 12 x 8 mm right lobe nodule. Bones demineralized.  IMPRESSION: Emphysematous changes without acute infiltrate. Question new 12 x 8 mm diameter right upper lobe nodule; CT chest recommended to exclude pulmonary nodule.   Original Report Authenticated By: Ulyses Southward, M.D.     Medications: Scheduled Meds: . acidophilus  1 capsule Oral Daily  . aspirin  325 mg Oral Daily  . atorvastatin  40 mg Oral Daily  . calcium carbonate  1 tablet Oral BID WC  . carvedilol  6.25 mg Oral BID WC  . cholecalciferol  400 Units Oral Daily  . clopidogrel  75 mg Oral Q breakfast  . enoxaparin (  LOVENOX) injection  40 mg Subcutaneous Q24H  . losartan  100 mg Oral Daily   And  . hydrochlorothiazide  12.5 mg Oral Daily  . levothyroxine  75 mcg Oral QAC breakfast  . montelukast  10 mg Oral QHS  . nitroGLYCERIN  0.5 inch Topical Q6H  . pantoprazole  40 mg Oral Q0600      LOS: 1 day   Shaylon Aden M.D. [facility] 12/19/2012, 10:01 AM Pager: [phone]  If 7PM-7AM, please contact night-coverage [email or web address] Password [email or web address]

## 2012-12-19 NOTE — ED Notes (Signed)
Attempt to Call report to the floor. RN in with pt and will return call.

## 2012-12-19 NOTE — H&P (Addendum)
[facility] History and Physical  Kathleen Thompson ZOX:[id number] DOB: July 05, 1934 DOA: 12/18/2012  Referring physician: EDP PCP: Carrie Mew, MD  Specialists:   Chief Complaint: Chest Pain  HPI: Kathleen Thompson is a 79 y.o. female   Who presented to the [facility] ED with complaints of chest pain described as chest pressure intermittently since yesterday.   She reports having SOB, and Nausea, but denies having vomiting or diaphoresis.  She rates the pain as a 6/10.   She also reports having SOB for the past 3 months and has been evaluated by her PCP.   In the ED she was evaluated and had a negative initial workup.  She was transferred to [facility] for further evaluation.      Review of Systems: The patient denies anorexia, fever, chills, headaches, weight loss, vision loss, decreased hearing, hoarseness, syncope, dyspnea on exertion, peripheral edema, balance deficits, hemoptysis, abdominal pain, vomiting, diarrhea,hematemesis, melena, hematochezia, severe indigestion/heartburn, hematuria, incontinence, dysuria, muscle weakness, suspicious skin lesions, transient blindness, difficulty walking, depression, unusual weight change, abnormal bleeding, enlarged lymph nodes, angioedema, and breast masses.    Past Medical History  Diagnosis Date  . Internal hemorrhoid   . Diverticulosis   . Gastroparesis   . GERD (gastroesophageal reflux disease)   . Adenomatous colon polyp 02/2003  . Cellulitis and abscess of hand   . Hyperlipidemia   . PVD (peripheral vascular disease)   . Hypothyroidism   . Hypertension   . CAD (coronary artery disease)   . Iron deficiency anemia   . Esophageal stricture   . Arthritis   . Blood transfusion   . Cataract   . Osteoporosis    Past Surgical History  Procedure Laterality Date  . Appendectomy    . Abdominal hysterectomy    . Cervical fusion    . Shoulder surgery        Medications:  HOME MEDS: Prior to Admission medications   Medication  Sig Start Date End Date Taking? Authorizing Provider  aspirin 81 MG tablet Take 81 mg by mouth daily.    Historical Provider, MD  atorvastatin (LIPITOR) 40 MG tablet Take 1 tablet (40 mg total) by mouth daily. 05/24/12   Stacie Glaze, MD  calcium carbonate 200 MG capsule Take 250 mg by mouth 2 (two) times daily with a meal.      Historical Provider, MD  carvedilol (COREG) 6.25 MG tablet Take 1 tablet (6.25 mg total) by mouth 2 (two) times daily. 05/24/12 05/24/13  Stacie Glaze, MD  clopidogrel (PLAVIX) 75 MG tablet Take 1 tablet (75 mg total) by mouth daily. 05/24/12   Stacie Glaze, MD  co-enzyme Q-10 30 MG capsule Take 10 mg by mouth daily.     Historical Provider, MD  furosemide (LASIX) 20 MG tablet Take 20 mg by mouth daily as needed. 12/19/11 12/18/12  Baker Pierini, FNP  levothyroxine (SYNTHROID, LEVOTHROID) 75 MCG tablet Take 1 tablet (75 mcg total) by mouth daily. 10/13/12   Stacie Glaze, MD  losartan-hydrochlorothiazide [facility]) 100-12.5 MG per tablet 1/2 tab bid 05/24/12   Stacie Glaze, MD  magnesium 30 MG tablet Take 30 mg by mouth daily.      Historical Provider, MD  montelukast (SINGULAIR) 10 MG tablet Take 1 tablet (10 mg total) by mouth at bedtime. 10/13/12   Stacie Glaze, MD  PAPAYA PO Take by mouth 3 x daily with food.    Historical Provider, MD  Probiotic Product (ALIGN) 4 MG  CAPS Take 1 capsule by mouth daily. 07/14/11   Meryl Dare, MD  traMADol (ULTRAM) 50 MG tablet Take 1 tablet (50 mg total) by mouth every 6 (six) hours as needed for pain. 05/24/12   Stacie Glaze, MD  vitamin D, CHOLECALCIFEROL, 400 UNITS tablet Take 400 Units by mouth daily.      Historical Provider, MD    Allergies:  Allergies  Allergen Reactions  . Rofecoxib   . Latex Rash    Pt states that latex causes blisters to skin     Social History:   reports that she has never smoked. She has never used smokeless tobacco. She reports that she does not drink alcohol or use illicit  drugs.   Family History: Family History  Problem Relation Age of Onset  . Colon cancer Brother     at age 65  . Colon cancer Sister     at age 49  . Diabetes Mother   . Diabetes Maternal Aunt   . Diabetes Maternal Uncle      Physical Exam:  GEN:  Pleasant 79 year old thin well developed Elderly Caucasian Female examined  and in no acute distress; cooperative with exam Filed Vitals:   12/18/12 2147 12/19/12 0159 12/19/12 0331  BP: 149/76 127/64 143/53  Pulse: 73 77 72  Temp:   98.1 F (36.7 C)  Resp:  18 18  Height: 5\' 4"  (1.626 m)  5\' 2"  (1.575 m)  Weight: 63.504 kg (140 lb)  62.687 kg (138 lb 3.2 oz)  SpO2: 100% 97% 100%   Blood pressure 143/53, pulse 72, temperature 98.1 F (36.7 C), resp. rate 18, height 5\' 2"  (1.575 m), weight 62.687 kg (138 lb 3.2 oz), SpO2 100.00%. PSYCH: She is alert and oriented x4; does not appear anxious does not appear depressed; affect is normal HEENT: Normocephalic and Atraumatic, Mucous membranes pink; PERRLA; EOM intact; Fundi:  Benign;  No scleral icterus, Nares: Patent, Oropharynx: Clear, Fair Dentition, Neck:  FROM, no cervical lymphadenopathy nor thyromegaly or carotid bruit; no JVD; Breasts:: Not examined CHEST WALL: No tenderness CHEST: Normal respiration, clear to auscultation bilaterally HEART: Regular rate and rhythm; no murmurs rubs or gallops BACK: No kyphosis or scoliosis; no CVA tenderness ABDOMEN: Positive Bowel Sounds, soft non-tender; no masses, no organomegaly.    Rectal Exam: Not done EXTREMITIES: No cyanosis, clubbing or edema; no ulcerations. Genitalia: not examined PULSES: 2+ and symmetric SKIN: Normal hydration no rash or ulceration CNS: Cranial nerves 2-12 grossly intact no focal neurologic deficit   Labs & Imaging Results for orders placed during the hospital encounter of 12/18/12 (from the past 48 hour(s))  CBC     Status: None   Collection Time    12/18/12 10:24 PM      Result Value Range   WBC 6.9  4.0 -  10.5 K/uL   RBC 4.15  3.87 - 5.11 MIL/uL   Hemoglobin 13.4  12.0 - 15.0 g/dL   HCT 82.9  56.2 - 13.0 %   MCV 91.1  78.0 - 100.0 fL   MCH 32.3  26.0 - 34.0 pg   MCHC 35.4  30.0 - 36.0 g/dL   RDW 86.5  78.4 - 69.6 %   Platelets 220  150 - 400 K/uL  BASIC METABOLIC PANEL     Status: Abnormal   Collection Time    12/18/12 10:24 PM      Result Value Range   Sodium 138  135 - 145 mEq/L  Potassium 3.5  3.5 - 5.1 mEq/L   Chloride 100  96 - 112 mEq/L   CO2 27  19 - 32 mEq/L   Glucose, Bld 107 (*) 70 - 99 mg/dL   BUN 14  6 - 23 mg/dL   Creatinine, Ser 1.47  0.50 - 1.10 mg/dL   Calcium 82.9  8.4 - 56.2 mg/dL   GFR calc non Af Amer 53 (*) >90 mL/min   GFR calc Af Amer 62 (*) >90 mL/min   Comment:            The eGFR has been calculated     using the CKD EPI equation.     This calculation has not been     validated in all clinical     situations.     eGFR's persistently     <90 mL/min signify     possible Chronic Kidney Disease.  TROPONIN I     Status: None   Collection Time    12/18/12 10:24 PM      Result Value Range   Troponin I <0.30  <0.30 ng/mL   Comment:            Due to the release kinetics of cTnI,     a negative result within the first hours     of the onset of symptoms does not rule out     myocardial infarction with certainty.     If myocardial infarction is still suspected,     repeat the test at appropriate intervals.  PRO B NATRIURETIC PEPTIDE     Status: None   Collection Time    12/18/12 10:24 PM      Result Value Range   Pro B Natriuretic peptide (BNP) 152.6  0 - 450 pg/mL    Radiological Exams on Admission: Dg Chest 2 View  12/19/2012  *RADIOLOGY REPORT*  Clinical Data:  Chest pressure for 1 day, dizziness, hypertension  CHEST - 2 VIEW  Comparison: 12/19/2011  Findings: Normal heart size and pulmonary vascularity. Calcified tortuous aorta. Lungs appear emphysematous but clear. No pleural effusion or pneumothorax. Question new 12 x 8 mm right lobe nodule.  Bones demineralized.  IMPRESSION: Emphysematous changes without acute infiltrate. Question new 12 x 8 mm diameter right upper lobe nodule; CT chest recommended to exclude pulmonary nodule.   Original Report Authenticated By: Ulyses Southward, M.D.     EKG:   Normal sinus Rhythm No Acute S-T changes   Assessment/Plan Principal Problem:   Chest pain Active Problems:   HYPERLIPIDEMIA   HYPERTENSION   CORONARY ARTERY DISEASE   HYPOTHYROIDISM   RUL Lung Nodule    1.  Chest Pain- Admit for Cardiac Rule Out.  Cycle Troponins, Nitropaste, O2, and ASA Rx.     2.  RUL Lung Nodule-  CT Scan ordered for evaluation.    3.  CAD- See #1.    4.   HTN-   Continue on Carvedilol. Hyzaar, and   5.   Hyperlipidemia- Continue on   And Check Fasting Lipids.  6.   Hypothyroid-  Continue   Check TSH.     7.   DVT prophylaxis with Lovenox.       Code Status:  FULL CODE Family Communication: No Family Present at Bedside Disposition Plan:  Return to Home on Discharge    Time spent: 8 Minutes   Ron Parker [facility] Pager [phone]  If 7PM-7AM, please contact night-coverage [email or web address] Password [email or web address] 12/19/2012, 5:50 AM

## 2012-12-19 NOTE — ED Notes (Signed)
[facility] here to transport pt to [facility]. Denies any  Complaints of c/p or sob at d/c. Sinus on tele.

## 2012-12-19 NOTE — ED Notes (Signed)
Assigned to bed 6N62 @ [facility], RN notified.

## 2012-12-19 NOTE — Consult Note (Signed)
 CARDIOLOGY CONSULT NOTE  Patient ID: Kathleen Thompson, MRN: [id number], DOB/AGE: 77/31/1935 77 y.o. Admit date: 12/18/2012 Date of Consult: 12/19/2012  Primary Physician: JENKINS,JOHN EDWARD, MD Primary Cardiologist: Peter Nishan, MD   Reason for Consult: Chest pain  79-year-old female, with no documented CAD, but multiple CRFs, including strong FH, who has had multiple negative stress tests in the past, most recently in 2009. She also reportedly underwent one of the first cardiac catheterization procedures at [facility], some 25 years ago, which was reportedly negative.   She was seen in the office by Dr. Nishan in 2009 for evaluation of progressive palpitations and DOE, but no reported chest pain. She was referred for a GXT echocardiogram for risk stratification. This yielded NL LVF (EF 55-65%), but with significant ST segment depression and T wave inversion during recovery phase. Patient exercised into stage II, attaining 87% MPHR and 6.6 METS. Echocardiographic images were NL both at rest and post exercise. Dr. Nishan noted that the patient had history of documented positive EKGs with NL perfusion, with 2 previous nuclear imaging studies.  She presented to the hospital yesterday with complaint of progressive exertional dyspnea over the past 3 months, or so. She also complains of intermittent CP, particularly when walking up an incline. She experienced her worst episode to date yesterday, at approximately 8 PM, after she had worked for several hours during household chores, as well as washing the porch floor. She experienced sudden, sharp midsternal back pain which then radiated to the front and developed into a "pressure", fairly intense (6/10). She also felt bilateral neck discomfort, but denied radiation to the arms. She denied associated diaphoresis or nausea. Her discomfort lasted several hours, and she did not receive sublingual NTG in the ED. She denies any recurrent CP today. She notes that this  episode was not similar to her occasional reflux symptoms, which are characterized as a burning sensation.  Past Medical History  Diagnosis Date  . Internal hemorrhoid   . Diverticulosis   . Gastroparesis   . GERD (gastroesophageal reflux disease)   . Adenomatous colon polyp 02/2003  . Cellulitis and abscess of hand   . Hyperlipidemia   . PVD (peripheral vascular disease)   . Hypothyroidism   . Hypertension   . Iron deficiency anemia   . Esophageal stricture   . Arthritis   . Blood transfusion   . Cataract   . Osteoporosis   . TIA (transient ischemic attack)      Surgical History:  Past Surgical History  Procedure Laterality Date  . Appendectomy    . Abdominal hysterectomy    . Cervical fusion    . Shoulder surgery       Home Meds: Prior to Admission medications   Medication Sig Start Date End Date Taking? Authorizing Provider  aspirin 81 MG tablet Take 81 mg by mouth daily.    Historical Provider, MD  atorvastatin (LIPITOR) 40 MG tablet Take 1 tablet (40 mg total) by mouth daily. 05/24/12   John E Jenkins, MD  calcium carbonate 200 MG capsule Take 250 mg by mouth 2 (two) times daily with a meal.      Historical Provider, MD  carvedilol (COREG) 6.25 MG tablet Take 1 tablet (6.25 mg total) by mouth 2 (two) times daily. 05/24/12 05/24/13  John E Jenkins, MD  clopidogrel (PLAVIX) 75 MG tablet Take 1 tablet (75 mg total) by mouth daily. 05/24/12   John E Jenkins, MD  co-enzyme Q-10 30 MG capsule Take 10   mg by mouth daily.     Historical Provider, MD  furosemide (LASIX) 20 MG tablet Take 20 mg by mouth daily as needed. 12/19/11 12/18/12  Padonda B Campbell, FNP  levothyroxine (SYNTHROID, LEVOTHROID) 75 MCG tablet Take 1 tablet (75 mcg total) by mouth daily. 10/13/12   John E Jenkins, MD  losartan-hydrochlorothiazide (HYZAAR) 100-12.5 MG per tablet 1/2 tab bid 05/24/12   John E Jenkins, MD  magnesium 30 MG tablet Take 30 mg by mouth daily.      Historical Provider, MD  montelukast  (SINGULAIR) 10 MG tablet Take 1 tablet (10 mg total) by mouth at bedtime. 10/13/12   John E Jenkins, MD  PAPAYA PO Take by mouth 3 x daily with food.    Historical Provider, MD  Probiotic Product (ALIGN) 4 MG CAPS Take 1 capsule by mouth daily. 07/14/11   Malcolm T Stark, MD  traMADol (ULTRAM) 50 MG tablet Take 1 tablet (50 mg total) by mouth every 6 (six) hours as needed for pain. 05/24/12   John E Jenkins, MD  vitamin D, CHOLECALCIFEROL, 400 UNITS tablet Take 400 Units by mouth daily.      Historical Provider, MD    Inpatient Medications:  . acidophilus  1 capsule Oral Daily  . aspirin  325 mg Oral Daily  . atorvastatin  40 mg Oral Daily  . calcium carbonate  1 tablet Oral BID WC  . carvedilol  6.25 mg Oral BID WC  . cholecalciferol  400 Units Oral Daily  . clopidogrel  75 mg Oral Q breakfast  . enoxaparin (LOVENOX) injection  40 mg Subcutaneous Q24H  . losartan  100 mg Oral Daily   And  . hydrochlorothiazide  12.5 mg Oral Daily  . levothyroxine  75 mcg Oral QAC breakfast  . montelukast  10 mg Oral QHS  . nitroGLYCERIN  0.5 inch Topical Q6H  . pantoprazole  40 mg Oral Q0600   Allergies:  Allergies  Allergen Reactions  . Rofecoxib     hives  . Latex Rash    Pt states that latex causes blisters to skin   History   Social History  . Marital Status: Married    Spouse Name: N/A    Number of Children: N/A  . Years of Education: N/A   Occupational History  . Retired    Social History Main Topics  . Smoking status: Never Smoker   . Smokeless tobacco: Never Used  . Alcohol Use: No  . Drug Use: No  . Sexually Active: Yes   Other Topics Concern  . Not on file   Social History Narrative  . No narrative on file     Family History  Problem Relation Age of Onset  . Colon cancer Brother     at age 62  . Colon cancer Sister     at age 73  . Diabetes Mother   . Diabetes Maternal Aunt   . Diabetes Maternal Uncle   . Heart attack Mother   . Heart attack Father   . Heart  attack Sister   . Heart attack Brother   . Heart attack Brother   . Heart attack Brother   . Heart attack Brother   . Heart attack Sister     Review of Systems: General: negative for chills, fever, night sweats or weight changes.  Cardiovascular: negative for chest pain, edema, orthopnea, palpitations, paroxysmal nocturnal dyspnea, shortness of breath or dyspnea on exertion Dermatological: negative for rash Respiratory: negative for cough or   wheezing Urologic: negative for hematuria Abdominal: negative for nausea, vomiting, diarrhea, bright red blood per rectum, melena, or hematemesis Neurologic: negative for visual changes, syncope, or dizziness All other systems reviewed and are otherwise negative except as noted above.  Labs:  Recent Labs  12/18/12 2224 12/19/12 0750  TROPONINI <0.30 <0.30   Lab Results  Component Value Date   WBC 6.9 12/18/2012   HGB 13.4 12/18/2012   HCT 37.8 12/18/2012   MCV 91.1 12/18/2012   PLT 220 12/18/2012     Recent Labs Lab 12/18/12 2224  NA 138  K 3.5  CL 100  CO2 27  BUN 14  CREATININE 1.00  CALCIUM 10.0  GLUCOSE 107*   Lab Results  Component Value Date   CHOL 179 06/10/2012   HDL 54.00 06/10/2012   LDLCALC 96 06/10/2012   TRIG 145.0 06/10/2012   EKG: Tracing performed 12/18/12 and reviewed: Normal sinus rhythm; left atrial abnormality; minor nonspecific ST-T wave abnormality. No previous tracing for comparison.  Radiology/Studies:  Dg Chest 2 View  12/19/2012   Normal heart size and pulmonary vascularity. Calcified tortuous aorta. Lungs appear emphysematous but clear. No pleural effusion or pneumothorax. Question new 12 x 8 mm right lobe nodule. Bones demineralized. CT of chest advised.  Physical Exam: Blood pressure 105/48, pulse 69, temperature 97.9 F (36.6 C), resp. rate 18, height 5' 2" (1.575 m), weight 138 lb 3.2 oz (62.687 kg), SpO2 98.00%. General: Well developed, well nourished, in no acute distress. Head:  Normocephalic, atraumatic, sclera non-icteric, no xanthomas, nares are without discharge.  Neck: Negative for carotid bruits. JVD not elevated. Lungs: Clear bilaterally to auscultation without wheezes, rales, or rhonchi. Breathing is unlabored. Heart: RRR with S1 S2. No murmurs, rubs, or gallops appreciated. Abdomen: Soft, non-tender, non-distended with normoactive bowel sounds. No hepatomegaly. No rebound/guarding. No obvious abdominal masses. Msk:  Strength and tone appear normal for age. Extremities: palpable femoral pulses, no bruits; no peripheral edema; intact distal pulses Neuro: Alert and oriented X 3. Moves all extremities spontaneously. Psych:  Responds to questions appropriately with a normal affect.   Assessment and Plan:  1 Chest pain  - NL troponins  - h/o negative stress tests x3, most recently 02/2008  - remote h/o negative cardiac catheterization  2 HTN: Blood pressure adequately controlled in recent years with only occasional systolics in the 140-150 range.  3 RUL nodule  - no history of tobacco use  - f/u CT scan performed; interpretation pending.  PLAN: Recommendation is to proceed with diagnostic cardiac catheterization in AM. Risks/benefits of procedure were discussed, and pt is willing to proceed. Continue Rx with Plavix, Coreg, statin, Lovenox. Reduce ASA to 81 daily.  SERPE, EUGENE PA-C 12/19/2012, 12:54 PM  Cardiology Attending Patient interviewed and examined. Discussed with Eugene Serpe, PA-C.  Above note annotated and modified based upon my findings.  Patient has required multiple stress tests and now hospitalization for recurrent episodes of chest discomfort. The symptoms that led to this admission were more severe than any she has experienced in the past. She has minor EKG abnormalities. At this point, cardiac catheterization is warranted to definitively diagnose or exclude coronary disease. Patient appraised of the risks including myocardial infarction,  stroke and death and agrees to proceed.    Quinisha Mould, MD 12/19/2012, 1:52 PM           

## 2012-12-19 NOTE — ED Notes (Signed)
Returned from xray

## 2012-12-19 NOTE — Progress Notes (Signed)
Ur ins review. 

## 2012-12-19 NOTE — ED Notes (Signed)
Transported to X-ray

## 2012-12-19 NOTE — ED Notes (Signed)
Report given to [facility] at [facility]

## 2012-12-19 NOTE — ED Notes (Signed)
Report given to Luke with [facility]  

## 2012-12-19 NOTE — Progress Notes (Signed)
PHARMACIST - PHYSICIAN ORDER COMMUNICATION  CONCERNING: P&T Medication Policy on Herbal Medications  DESCRIPTION:  This patient's order for:  Co-enzyme Q-10, magnesium tablet 30 mg, Align  has been noted.  This product(s) is classified as an "herbal" or natural product. Due to a lack of definitive safety studies or FDA approval, nonstandard manufacturing practices, plus the potential risk of unknown drug-drug interactions while on inpatient medications, the Pharmacy and Therapeutics Committee does not permit the use of "herbal" or natural products of this type within [facility].   ACTION TAKEN: The pharmacy department is unable to verify this order at this time and your patient has been informed of this safety policy. Please reevaluate patient's clinical condition at discharge and address if the herbal or natural product(s) should be resumed at that time.

## 2012-12-20 ENCOUNTER — Encounter (HOSPITAL_COMMUNITY)
Admission: EM | Disposition: A | Discharge status: Home / Self Care | Payer: Self-pay | Source: Home / Self Care | Attending: Emergency Medicine

## 2012-12-20 DIAGNOSIS — I2510 Atherosclerotic heart disease of native coronary artery without angina pectoris: Secondary | ICD-10-CM

## 2012-12-20 DIAGNOSIS — K219 Gastro-esophageal reflux disease without esophagitis: Secondary | ICD-10-CM

## 2012-12-20 DIAGNOSIS — R911 Solitary pulmonary nodule: Secondary | ICD-10-CM

## 2012-12-20 HISTORY — PX: LEFT HEART CATHETERIZATION WITH CORONARY ANGIOGRAM: SHX5451

## 2012-12-20 LAB — BASIC METABOLIC PANEL
CO2: 24 mEq/L (ref 19–32)
Calcium: 9.7 mg/dL (ref 8.4–10.5)
Chloride: 100 mEq/L (ref 96–112)
Glucose, Bld: 115 mg/dL — ABNORMAL HIGH (ref 70–99)
Potassium: 3.3 mEq/L — ABNORMAL LOW (ref 3.5–5.1)
Sodium: 137 mEq/L (ref 135–145)

## 2012-12-20 LAB — CBC
Hemoglobin: 13.5 g/dL (ref 12.0–15.0)
MCV: 89.5 fL (ref 78.0–100.0)
Platelets: 212 10*3/uL (ref 150–400)
RBC: 4.27 MIL/uL (ref 3.87–5.11)
WBC: 5.9 10*3/uL (ref 4.0–10.5)

## 2012-12-20 SURGERY — LEFT HEART CATHETERIZATION WITH CORONARY ANGIOGRAM
Anesthesia: LOCAL

## 2012-12-20 MED ORDER — HEPARIN (PORCINE) IN NACL 2-0.9 UNIT/ML-% IJ SOLN
INTRAMUSCULAR | Status: AC
  Filled 2012-12-20: qty 1000

## 2012-12-20 MED ORDER — SODIUM CHLORIDE 0.9 % IV BOLUS (SEPSIS)
250.0000 mL | Freq: Once | INTRAVENOUS | Status: AC
  Administered 2012-12-20: 250 mL via INTRAVENOUS

## 2012-12-20 MED ORDER — MONTELUKAST SODIUM 10 MG PO TABS
10.0000 mg | ORAL_TABLET | Freq: Every day | ORAL | Status: DC
  Administered 2012-12-20 – 2012-12-21 (×2): 10 mg via ORAL
  Filled 2012-12-20 (×2): qty 1

## 2012-12-20 MED ORDER — MIDAZOLAM HCL 2 MG/2ML IJ SOLN
INTRAMUSCULAR | Status: AC
  Filled 2012-12-20: qty 2

## 2012-12-20 MED ORDER — FENTANYL CITRATE 0.05 MG/ML IJ SOLN
INTRAMUSCULAR | Status: AC
  Filled 2012-12-20: qty 2

## 2012-12-20 MED ORDER — ISOSORBIDE MONONITRATE ER 30 MG PO TB24
30.0000 mg | ORAL_TABLET | Freq: Every day | ORAL | Status: DC
  Administered 2012-12-20 – 2012-12-21 (×2): 30 mg via ORAL
  Filled 2012-12-20 (×2): qty 1

## 2012-12-20 MED ORDER — NITROGLYCERIN 1 MG/10 ML FOR IR/CATH LAB
INTRA_ARTERIAL | Status: AC
  Filled 2012-12-20: qty 10

## 2012-12-20 MED ORDER — LIDOCAINE HCL (PF) 1 % IJ SOLN
INTRAMUSCULAR | Status: AC
  Filled 2012-12-20: qty 30

## 2012-12-20 MED ORDER — POTASSIUM CHLORIDE CRYS ER 20 MEQ PO TBCR
40.0000 meq | EXTENDED_RELEASE_TABLET | Freq: Once | ORAL | Status: AC
  Administered 2012-12-20: 40 meq via ORAL
  Filled 2012-12-20: qty 2

## 2012-12-20 MED ORDER — ASPIRIN 81 MG PO CHEW: 324.0000 mg | CHEWABLE_TABLET | ORAL | Status: DC

## 2012-12-20 MED ORDER — SODIUM CHLORIDE 0.9 % IJ SOLN: 3.0000 mL | Freq: Two times a day (BID) | INTRAMUSCULAR | Status: DC

## 2012-12-20 MED ORDER — SODIUM CHLORIDE 0.9 % IJ SOLN: 3.0000 mL | INTRAMUSCULAR | Status: DC | PRN

## 2012-12-20 MED ORDER — SODIUM CHLORIDE 0.9 % IV SOLN
1.0000 mL/kg/h | INTRAVENOUS | Status: AC
  Administered 2012-12-20: 1 mL/kg/h via INTRAVENOUS

## 2012-12-20 MED ORDER — SODIUM CHLORIDE 0.9 % IV SOLN: 250.0000 mL | INTRAVENOUS | Status: DC | PRN

## 2012-12-20 MED ORDER — SODIUM CHLORIDE 0.9 % IV SOLN: 1.0000 mL/kg/h | INTRAVENOUS | Status: DC

## 2012-12-20 NOTE — Interval H&P Note (Signed)
History and Physical Interval Note:  12/20/2012 12:16 PM  Kathleen Thompson  has presented today for surgery, with the diagnosis of cp  The various methods of treatment have been discussed with the patient and family. After consideration of risks, benefits and other options for treatment, the patient has consented to  Procedure(s): LEFT HEART CATHETERIZATION WITH CORONARY ANGIOGRAM (N/A) as a surgical intervention .  The patient's history has been reviewed, patient examined, no change in status, stable for surgery.  I have reviewed the patient's chart and labs.  Questions were answered to the patient's satisfaction.     Theron Arista [name] 12/20/2012 12:17 PM

## 2012-12-20 NOTE — Progress Notes (Signed)
Patient ID: Kathleen Thompson  female  ZOX:[id number]    DOB: [birth year]    DOA: 12/18/2012  PCP: Carrie Mew, MD    Assessment/Plan: Principal Problem:   Chest pain with intermittent dyspnea for last 3 months, worsened in the last 2 days: concern for unstable angina  - cardiac enzymes negative, BNP 152.6, last stress echo was 2009 was normal, EF 55-65% - cardiac cath today - Continue aspirin, Plavix, statin, Coreg, Cozaar  GERD -  on PPI    HYPOTHYROIDISM    HYPERLIPIDEMIA: On statins    HYPERTENSION- currently stable    CORONARY ARTERY DISEASE - Continue aspirin, Plavix, statin, Coreg, Cozaar    Lung nodule, solitary - CT chest showed 18-11 and then solid right upper lobe pulmonary nodule without lymphadenopathy, prominent caliber of ascending thoracic aorta 3.9cm - Discussed in detail with the patient, she will need an outpatient PET scan, will need to be arranged by PCP, Dr Lovell Sheehan.    DVT Prophylaxis:  Code Status:  Disposition:    Subjective: Chest pain is resolved, no complaints at this time, awaiting cardiac cath  Objective: Weight change:   Intake/Output Summary (Last 24 hours) at 12/20/12 0946 Last data filed at 12/19/12 1700  Gross per 24 hour  Intake    480 ml  Output      0 ml  Net    480 ml   Blood pressure 105/55, pulse 74, temperature 98.4 F (36.9 C), temperature source Oral, resp. rate 18, height 5\' 2"  (1.575 m), weight 62.687 kg (138 lb 3.2 oz), SpO2 94.00%.  Physical Exam: General: Alert and awake, oriented x3, not in any acute distress. CVS: S1-S2 clear, no murmur rubs or gallops Chest: CTAB, no wheezing, rales or rhonchi Abdomen: soft NT, ND, NBS Extremities: no c/c/e bilaterally   Lab Results: Basic Metabolic Panel:  Recent Labs Lab 12/18/12 2224 12/20/12 0510  NA 138 137  K 3.5 3.3*  CL 100 100  CO2 27 24  GLUCOSE 107* 115*  BUN 14 9  CREATININE 1.00 0.87  CALCIUM 10.0 9.7   CBC:  Recent Labs Lab  12/18/12 2224 12/20/12 0510  WBC 6.9 5.9  HGB 13.4 13.5  HCT 37.8 38.2  MCV 91.1 89.5  PLT 220 212   Cardiac Enzymes:  Recent Labs Lab 12/19/12 0750 12/19/12 1402 12/19/12 1945  TROPONINI <0.30 <0.30 <0.30    Studies/Results: Dg Chest 2 View  12/19/2012  *RADIOLOGY REPORT*  Clinical Data:  Chest pressure for 1 day, dizziness, hypertension  CHEST - 2 VIEW  Comparison: 12/19/2011  Findings: Normal heart size and pulmonary vascularity. Calcified tortuous aorta. Lungs appear emphysematous but clear. No pleural effusion or pneumothorax. Question new 12 x 8 mm right lobe nodule. Bones demineralized.  IMPRESSION: Emphysematous changes without acute infiltrate. Question new 12 x 8 mm diameter right upper lobe nodule; CT chest recommended to exclude pulmonary nodule.   Original Report Authenticated By: Ulyses Southward, M.D.     Medications: Scheduled Meds: . acidophilus  1 capsule Oral Daily  . aspirin  324 mg Oral Pre-Cath  . aspirin EC  81 mg Oral Daily  . atorvastatin  40 mg Oral Daily  . calcium carbonate  1 tablet Oral BID WC  . carvedilol  6.25 mg Oral BID WC  . cholecalciferol  400 Units Oral Daily  . clopidogrel  75 mg Oral Q breakfast  . enoxaparin (LOVENOX) injection  40 mg Subcutaneous Q24H  . losartan  100 mg Oral Daily   And  .  hydrochlorothiazide  12.5 mg Oral Daily  . levothyroxine  75 mcg Oral QAC breakfast  . montelukast  10 mg Oral Daily  . nitroGLYCERIN  0.5 inch Topical Q6H  . pantoprazole  40 mg Oral Q0600  . sodium chloride  3 mL Intravenous Q12H  . sodium chloride  3 mL Intravenous Q12H      LOS: 2 days   Munirah Doerner M.D. [facility] 12/20/2012, 9:46 AM Pager: [phone]  If 7PM-7AM, please contact night-coverage [email or web address] Password [email or web address]

## 2012-12-20 NOTE — Progress Notes (Signed)
Pt BP hovering in upper 70s to low 80's systolic after 1700 dose of Imdur given. Alinda Money, PA with [facility] paged and made aware. Order received for 2500cc NS bolus. Will continue to monitor closely.

## 2012-12-20 NOTE — Progress Notes (Signed)
Subjective: Patient denies CP  Breathing is OK Objective: Filed Vitals:   12/19/12 0617 12/19/12 1318 12/19/12 2118 12/20/12 0614  BP: 105/48 110/54 111/61 105/55  Pulse: 69 70 71 74  Temp: 97.9 F (36.6 C) 98.1 F (36.7 C) 98.7 F (37.1 C) 98.4 F (36.9 C)  TempSrc:  Oral Oral Oral  Resp: 18 18 18 18   Height:      Weight:      SpO2: 98% 97% 96% 94%   Weight change:   Intake/Output Summary (Last 24 hours) at 12/20/12 0807 Last data filed at 12/19/12 1700  Gross per 24 hour  Intake    720 ml  Output      0 ml  Net    720 ml    General: Alert, awake, oriented x3, in no acute distress Neck:  JVP is normal Heart: Regular rate and rhythm, without murmurs, rubs, gallops.  Lungs: Clear to auscultation.  No rales or wheezes. Exemities:  No edema.   Neuro: Grossly intact, nonfocal.  Tele:  SR Lab Results: Results for orders placed during the hospital encounter of 12/18/12 (from the past 24 hour(s))  TROPONIN I     Status: None   Collection Time    12/19/12  2:02 PM      Result Value Range   Troponin I <0.30  <0.30 ng/mL  PROTIME-INR     Status: None   Collection Time    12/19/12  2:02 PM      Result Value Range   Prothrombin Time 13.9  11.6 - 15.2 seconds   INR 1.08  0.00 - 1.49  TROPONIN I     Status: None   Collection Time    12/19/12  7:45 PM      Result Value Range   Troponin I <0.30  <0.30 ng/mL  BASIC METABOLIC PANEL     Status: Abnormal   Collection Time    12/20/12  5:10 AM      Result Value Range   Sodium 137  135 - 145 mEq/L   Potassium 3.3 (*) 3.5 - 5.1 mEq/L   Chloride 100  96 - 112 mEq/L   CO2 24  19 - 32 mEq/L   Glucose, Bld 115 (*) 70 - 99 mg/dL   BUN 9  6 - 23 mg/dL   Creatinine, Ser 1.61  0.50 - 1.10 mg/dL   Calcium 9.7  8.4 - 09.6 mg/dL   GFR calc non Af Amer 63 (*) >90 mL/min   GFR calc Af Amer 73 (*) >90 mL/min  CBC     Status: None   Collection Time    12/20/12  5:10 AM      Result Value Range   WBC 5.9  4.0 - 10.5 K/uL   RBC 4.27   3.87 - 5.11 MIL/uL   Hemoglobin 13.5  12.0 - 15.0 g/dL   HCT 04.5  40.9 - 81.1 %   MCV 89.5  78.0 - 100.0 fL   MCH 31.6  26.0 - 34.0 pg   MCHC 35.3  30.0 - 36.0 g/dL   RDW 91.4  78.2 - 95.6 %   Platelets 212  150 - 400 K/uL    Studies/Results: @RISRSLT24 @  Medications: Reviewed   @PROBHOSP @  1.  CP  Multiple neg work ups in past.  Remote cath.  Now with spell that is worse than previous Plan for cath to r/o signif CAD  Replete K  LOS: 2 days   Kathleen Thompson 12/20/2012, 8:07  AM

## 2012-12-20 NOTE — Progress Notes (Signed)
Pt BP 85/40 after 250cc bolus. Thomes Lolling PA notified. Pt asymptomatic, no new orders given at this time. Will continue to monitor closely.  Harless Litten, RN 12/20/12

## 2012-12-20 NOTE — CV Procedure (Signed)
   Cardiac Catheterization Procedure Note  Name: Kathleen Thompson MRN: [id number] DOB: 03/10/1934  Procedure: Left Heart Cath, Selective Coronary Angiography, LV angiography  Indication: 79 yo WF with multiple cardiac risk factors presents with progressive dyspnea on exertion and chest pain.   Procedural Details: The right wrist was prepped, draped, and anesthetized with 1% lidocaine. Using the modified Seldinger technique, a 5 French sheath was introduced into the right radial artery. 3 mg of verapamil was administered through the sheath, weight-based unfractionated heparin was administered intravenously. Standard Judkins catheters were used for selective coronary angiography and left ventriculography. Catheter exchanges were performed over an exchange length guidewire. There were no immediate procedural complications. A TR band was used for radial hemostasis at the completion of the procedure.  The patient was transferred to the post catheterization recovery area for further monitoring.  Procedural Findings: Hemodynamics: AO 103/51 mean 73 mm Hg LV 109/12 mm Hg  Coronary angiography: Coronary dominance: right  Left mainstem: 20% ostial left main.  Left anterior descending (LAD): Small vessel diameter. 40% diffuse disease proximally. 40-50% after first diagonal.  Left circumflex (LCx): 40-50% ostial OM1.   Right coronary artery (RCA): small in caliber with focal 80% mid vessel stenosis.  Left ventriculography: Left ventricular systolic function is normal, LVEF is estimated at 65%, there is no significant mitral regurgitation. There is mild aortic root enlargement.  Final Conclusions:   1. Single vessel obstructive CAD. The RCA is small in diameter at 2 mm.  2. Normal LV function.  Recommendations: Recommend continued medical therapy with anti-platelet therapy, statin, beta blocker, and nitrates. If she has significant refractory angina despite medical therapy could consider PCI of  the RCA but options would be limited by small vessel size.  Theron Arista [facility] 12/20/2012, 12:50 PM

## 2012-12-20 NOTE — H&P (View-Only) (Signed)
CARDIOLOGY CONSULT NOTE  Patient ID: Kathleen Thompson, MRN: [id number], DOB/AGE: Feb 12, 1934 79 y.o. Admit date: 12/18/2012 Date of Consult: 12/19/2012  Primary Physician: Carrie Mew, MD Primary Cardiologist: Charlton Haws, MD   Reason for Consult: Chest pain  79 year old female, with no documented CAD, but multiple CRFs, including strong FH, who has had multiple negative stress tests in the past, most recently in 2009. She also reportedly underwent one of the first cardiac catheterization procedures at [facility], some 25 years ago, which was reportedly negative.   She was seen in the office by Dr. Eden Emms in 2009 for evaluation of progressive palpitations and DOE, but no reported chest pain. She was referred for a GXT echocardiogram for risk stratification. This yielded NL LVF (EF 55-65%), but with significant ST segment depression and T wave inversion during recovery phase. Patient exercised into stage II, attaining 87% MPHR and 6.6 METS. Echocardiographic images were NL both at rest and post exercise. Dr. Eden Emms noted that the patient had history of documented positive EKGs with NL perfusion, with 2 previous nuclear imaging studies.  She presented to the hospital yesterday with complaint of progressive exertional dyspnea over the past 3 months, or so. She also complains of intermittent CP, particularly when walking up an incline. She experienced her worst episode to date yesterday, at approximately 8 PM, after she had worked for several hours during household chores, as well as washing the porch floor. She experienced sudden, sharp midsternal back pain which then radiated to the front and developed into a "pressure", fairly intense (6/10). She also felt bilateral neck discomfort, but denied radiation to the arms. She denied associated diaphoresis or nausea. Her discomfort lasted several hours, and she did not receive sublingual NTG in the ED. She denies any recurrent CP today. She notes that this  episode was not similar to her occasional reflux symptoms, which are characterized as a burning sensation.  Past Medical History  Diagnosis Date  . Internal hemorrhoid   . Diverticulosis   . Gastroparesis   . GERD (gastroesophageal reflux disease)   . Adenomatous colon polyp 02/2003  . Cellulitis and abscess of hand   . Hyperlipidemia   . PVD (peripheral vascular disease)   . Hypothyroidism   . Hypertension   . Iron deficiency anemia   . Esophageal stricture   . Arthritis   . Blood transfusion   . Cataract   . Osteoporosis   . TIA (transient ischemic attack)      Surgical History:  Past Surgical History  Procedure Laterality Date  . Appendectomy    . Abdominal hysterectomy    . Cervical fusion    . Shoulder surgery       Home Meds: Prior to Admission medications   Medication Sig Start Date End Date Taking? Authorizing Provider  aspirin 81 MG tablet Take 81 mg by mouth daily.    Historical Provider, MD  atorvastatin (LIPITOR) 40 MG tablet Take 1 tablet (40 mg total) by mouth daily. 05/24/12   Stacie Glaze, MD  calcium carbonate 200 MG capsule Take 250 mg by mouth 2 (two) times daily with a meal.      Historical Provider, MD  carvedilol (COREG) 6.25 MG tablet Take 1 tablet (6.25 mg total) by mouth 2 (two) times daily. 05/24/12 05/24/13  Stacie Glaze, MD  clopidogrel (PLAVIX) 75 MG tablet Take 1 tablet (75 mg total) by mouth daily. 05/24/12   Stacie Glaze, MD  co-enzyme Q-10 30 MG capsule Take 10  mg by mouth daily.     Historical Provider, MD  furosemide (LASIX) 20 MG tablet Take 20 mg by mouth daily as needed. 12/19/11 12/18/12  Baker Pierini, FNP  levothyroxine (SYNTHROID, LEVOTHROID) 75 MCG tablet Take 1 tablet (75 mcg total) by mouth daily. 10/13/12   Stacie Glaze, MD  losartan-hydrochlorothiazide [facility]) 100-12.5 MG per tablet 1/2 tab bid 05/24/12   Stacie Glaze, MD  magnesium 30 MG tablet Take 30 mg by mouth daily.      Historical Provider, MD  montelukast  (SINGULAIR) 10 MG tablet Take 1 tablet (10 mg total) by mouth at bedtime. 10/13/12   Stacie Glaze, MD  PAPAYA PO Take by mouth 3 x daily with food.    Historical Provider, MD  Probiotic Product (ALIGN) 4 MG CAPS Take 1 capsule by mouth daily. 07/14/11   Meryl Dare, MD  traMADol (ULTRAM) 50 MG tablet Take 1 tablet (50 mg total) by mouth every 6 (six) hours as needed for pain. 05/24/12   Stacie Glaze, MD  vitamin D, CHOLECALCIFEROL, 400 UNITS tablet Take 400 Units by mouth daily.      Historical Provider, MD    Inpatient Medications:  . acidophilus  1 capsule Oral Daily  . aspirin  325 mg Oral Daily  . atorvastatin  40 mg Oral Daily  . calcium carbonate  1 tablet Oral BID WC  . carvedilol  6.25 mg Oral BID WC  . cholecalciferol  400 Units Oral Daily  . clopidogrel  75 mg Oral Q breakfast  . enoxaparin (LOVENOX) injection  40 mg Subcutaneous Q24H  . losartan  100 mg Oral Daily   And  . hydrochlorothiazide  12.5 mg Oral Daily  . levothyroxine  75 mcg Oral QAC breakfast  . montelukast  10 mg Oral QHS  . nitroGLYCERIN  0.5 inch Topical Q6H  . pantoprazole  40 mg Oral Q0600   Allergies:  Allergies  Allergen Reactions  . Rofecoxib     hives  . Latex Rash    Pt states that latex causes blisters to skin   History   Social History  . Marital Status: Married    Spouse Name: N/A    Number of Children: N/A  . Years of Education: N/A   Occupational History  . Retired    Social History Main Topics  . Smoking status: Never Smoker   . Smokeless tobacco: Never Used  . Alcohol Use: No  . Drug Use: No  . Sexually Active: Yes   Other Topics Concern  . Not on file   Social History Narrative  . No narrative on file     Family History  Problem Relation Age of Onset  . Colon cancer Brother     at age 69  . Colon cancer Sister     at age 36  . Diabetes Mother   . Diabetes Maternal Aunt   . Diabetes Maternal Uncle   . Heart attack Mother   . Heart attack Father   . Heart  attack Sister   . Heart attack Brother   . Heart attack Brother   . Heart attack Brother   . Heart attack Brother   . Heart attack Sister     Review of Systems: General: negative for chills, fever, night sweats or weight changes.  Cardiovascular: negative for chest pain, edema, orthopnea, palpitations, paroxysmal nocturnal dyspnea, shortness of breath or dyspnea on exertion Dermatological: negative for rash Respiratory: negative for cough or  wheezing Urologic: negative for hematuria Abdominal: negative for nausea, vomiting, diarrhea, bright red blood per rectum, melena, or hematemesis Neurologic: negative for visual changes, syncope, or dizziness All other systems reviewed and are otherwise negative except as noted above.  Labs:  Recent Labs  12/18/12 2224 12/19/12 0750  TROPONINI <0.30 <0.30   Lab Results  Component Value Date   WBC 6.9 12/18/2012   HGB 13.4 12/18/2012   HCT 37.8 12/18/2012   MCV 91.1 12/18/2012   PLT 220 12/18/2012     Recent Labs Lab 12/18/12 2224  NA 138  K 3.5  CL 100  CO2 27  BUN 14  CREATININE 1.00  CALCIUM 10.0  GLUCOSE 107*   Lab Results  Component Value Date   CHOL 179 06/10/2012   HDL 54.00 06/10/2012   LDLCALC 96 06/10/2012   TRIG 145.0 06/10/2012   EKG: Tracing performed 12/18/12 and reviewed: Normal sinus rhythm; left atrial abnormality; minor nonspecific ST-T wave abnormality. No previous tracing for comparison.  Radiology/Studies:  Dg Chest 2 View  12/19/2012   Normal heart size and pulmonary vascularity. Calcified tortuous aorta. Lungs appear emphysematous but clear. No pleural effusion or pneumothorax. Question new 12 x 8 mm right lobe nodule. Bones demineralized. CT of chest advised.  Physical Exam: Blood pressure 105/48, pulse 69, temperature 97.9 F (36.6 C), resp. rate 18, height 5\' 2"  (1.575 m), weight 138 lb 3.2 oz (62.687 kg), SpO2 98.00%. General: Well developed, well nourished, in no acute distress. Head:  Normocephalic, atraumatic, sclera non-icteric, no xanthomas, nares are without discharge.  Neck: Negative for carotid bruits. JVD not elevated. Lungs: Clear bilaterally to auscultation without wheezes, rales, or rhonchi. Breathing is unlabored. Heart: RRR with S1 S2. No murmurs, rubs, or gallops appreciated. Abdomen: Soft, non-tender, non-distended with normoactive bowel sounds. No hepatomegaly. No rebound/guarding. No obvious abdominal masses. Msk:  Strength and tone appear normal for age. Extremities: palpable femoral pulses, no bruits; no peripheral edema; intact distal pulses Neuro: Alert and oriented X 3. Moves all extremities spontaneously. Psych:  Responds to questions appropriately with a normal affect.   Assessment and Plan:  1 Chest pain  - NL troponins  - h/o negative stress tests x3, most recently 02/2008  - remote h/o negative cardiac catheterization  2 HTN: Blood pressure adequately controlled in recent years with only occasional systolics in the 140-150 range.  3 RUL nodule  - no history of tobacco use  - f/u CT scan performed; interpretation pending.  PLAN: Recommendation is to proceed with diagnostic cardiac catheterization in AM. Risks/benefits of procedure were discussed, and pt is willing to proceed. Continue Rx with Plavix, Coreg, statin, Lovenox. Reduce ASA to 81 daily.  SERPE, EUGENE PA-C 12/19/2012, 12:54 PM  Cardiology Attending Patient interviewed and examined. Discussed with Prescott Parma, PA-C.  Above note annotated and modified based upon my findings.  Patient has required multiple stress tests and now hospitalization for recurrent episodes of chest discomfort. The symptoms that led to this admission were more severe than any she has experienced in the past. She has minor EKG abnormalities. At this point, cardiac catheterization is warranted to definitively diagnose or exclude coronary disease. Patient appraised of the risks including myocardial infarction,  stroke and death and agrees to proceed.    [place] Bing, MD 12/19/2012, 1:52 PM

## 2012-12-21 DIAGNOSIS — J449 Chronic obstructive pulmonary disease, unspecified: Secondary | ICD-10-CM

## 2012-12-21 DIAGNOSIS — J4489 Other specified chronic obstructive pulmonary disease: Secondary | ICD-10-CM

## 2012-12-21 MED ORDER — ISOSORBIDE MONONITRATE ER 30 MG PO TB24: 30.0000 mg | ORAL_TABLET | Freq: Every day | ORAL | Status: DC

## 2012-12-21 NOTE — Discharge Summary (Signed)
Physician Discharge Summary  Patient ID: Kathleen Thompson MRN: [id number] DOB/AGE: July 17, 1934 79 y.o.  Admit date: 12/18/2012 Discharge date: 12/21/2012  Primary Care Physician:  Carrie Mew, MD  Discharge Diagnoses:    . Chest pain . HYPERTENSION . HYPOTHYROIDISM . CORONARY ARTERY DISEASE . HYPERLIPIDEMIA . Lung nodule, solitary  Consults:  Cardiology   Recommendations for Outpatient Follow-up:  CT chest showed 8x34mm solid right upper lobe pulmonary nodule without lymphadenopathy, prominent caliber of ascending thoracic aorta 3.9cm. Discussed in detail with the patient, she will need an outpatient PET scan, will need to be arranged by PCP, Dr Lovell Sheehan  Allergies:   Allergies  Allergen Reactions  . Rofecoxib     hives  . Latex Rash    Pt states that latex causes blisters to skin     Discharge Medications:   Medication List    TAKE these medications       ALIGN 4 MG Caps  Take 1 capsule by mouth daily.     aspirin 81 MG tablet  Take 81 mg by mouth daily.     atorvastatin 40 MG tablet  Commonly known as:  LIPITOR  Take 1 tablet (40 mg total) by mouth daily.     calcium carbonate 200 MG capsule  Take 250 mg by mouth 2 (two) times daily with a meal.     carvedilol 6.25 MG tablet  Commonly known as:  COREG  Take 1 tablet (6.25 mg total) by mouth 2 (two) times daily.     clopidogrel 75 MG tablet  Commonly known as:  PLAVIX  Take 1 tablet (75 mg total) by mouth daily.     co-enzyme Q-10 30 MG capsule  Take 10 mg by mouth daily.     furosemide 20 MG tablet  Commonly known as:  LASIX  Take 20 mg by mouth daily as needed.     isosorbide mononitrate 30 MG 24 hr tablet  Commonly known as:  IMDUR  Take 1 tablet (30 mg total) by mouth daily.     levothyroxine 75 MCG tablet  Commonly known as:  SYNTHROID, LEVOTHROID  Take 1 tablet (75 mcg total) by mouth daily.     losartan-hydrochlorothiazide 100-12.5 MG per tablet  Commonly known as:  HYZAAR   1/2 tab bid     magnesium 30 MG tablet  Take 30 mg by mouth daily.     montelukast 10 MG tablet  Commonly known as:  SINGULAIR  Take 1 tablet (10 mg total) by mouth at bedtime.     traMADol 50 MG tablet  Commonly known as:  ULTRAM  Take 1 tablet (50 mg total) by mouth every 6 (six) hours as needed for pain.     vitamin D (CHOLECALCIFEROL) 400 UNITS tablet  Take 400 Units by mouth daily.         Brief H and P: For complete details please refer to admission H and P, but in brief 79 y.o. female presented to the [facility] ED with complaints of chest pain described as chest pressure intermittently for a day before admission. She reported having SOB, and Nausea, but denied having vomiting or diaphoresis. She rated the pain as a 6/10. She also reported having SOB for the past 3 months and had been evaluated by her PCP. In the ED she was evaluated and had a negative initial workup. She was transferred to [facility] for further evaluation.    Hospital Course:  Chest pain with intermittent dyspnea for last 3 months,  worsened in the last 2 days: concern for unstable angina  cardiac enzymes remained negative negative. BNP 152.6, last stress echo was 2009 was normal, EF 55-65%. Cardiology was consulted and patient underwent cardiac cath which showed focal 80% mid vessel stenosis in the RCA, normal LV function, 65%, no significant mitral regurgitation, mild aortic root enlargement. Patient was recommended to continue with medical therapy with antiplatelet therapy, statin, beta blocker and nitrates. If she has significant refractory angina despite medical therapy, would consider PCI of the RCA at that point. Continue aspirin, Plavix, statin, Coreg, Cozaar.  HYPERLIPIDEMIA: On statins   HYPERTENSION- currently stable, Imdur was added   CORONARY ARTERY DISEASE  - Continue aspirin, Plavix, statin, Coreg, Cozaar   Lung nodule, solitary  - CT chest showed 8x29mm solid right upper lobe pulmonary nodule  without lymphadenopathy, prominent caliber of ascending thoracic aorta 3.9cm  - Discussed in detail with the patient, she will need an outpatient PET scan, will need to be arranged by PCP, Dr Lovell Sheehan   Day of Discharge BP 83/51  Pulse 74  Temp(Src) 98.6 F (37 C) (Oral)  Resp 18  Ht 5\' 2"  (1.575 m)  Wt 62.687 kg (138 lb 3.2 oz)  BMI 25.27 kg/m2  SpO2 96%  Physical Exam: General: Alert and awake oriented x3 not in any acute distress. HEENT: anicteric sclera, pupils reactive to light and accommodation CVS: S1-S2 clear no murmur rubs or gallops Chest: clear to auscultation bilaterally, no wheezing rales or rhonchi Abdomen: soft nontender, nondistended, normal bowel sounds, no organomegaly Extremities: no cyanosis, clubbing or edema noted bilaterally Neuro: Cranial nerves II-XII intact, no focal neurological deficits   The results of significant diagnostics from this hospitalization (including imaging, microbiology, ancillary and laboratory) are listed below for reference.    LAB RESULTS: Basic Metabolic Panel:  Recent Labs Lab 12/18/12 2224 12/20/12 0510  NA 138 137  K 3.5 3.3*  CL 100 100  CO2 27 24  GLUCOSE 107* 115*  BUN 14 9  CREATININE 1.00 0.87  CALCIUM 10.0 9.7   Liver Function Tests: No results found for this basename: AST, ALT, ALKPHOS, BILITOT, PROT, ALBUMIN,  in the last 168 hours No results found for this basename: LIPASE, AMYLASE,  in the last 168 hours No results found for this basename: AMMONIA,  in the last 168 hours CBC:  Recent Labs Lab 12/18/12 2224 12/20/12 0510  WBC 6.9 5.9  HGB 13.4 13.5  HCT 37.8 38.2  MCV 91.1 89.5  PLT 220 212   Cardiac Enzymes:  Recent Labs Lab 12/19/12 1402 12/19/12 1945  TROPONINI <0.30 <0.30   BNP: No components found with this basename: POCBNP,  CBG: No results found for this basename: GLUCAP,  in the last 168 hours  Significant Diagnostic Studies:  Dg Chest 2 View  12/19/2012  *RADIOLOGY REPORT*   Clinical Data:  Chest pressure for 1 day, dizziness, hypertension  CHEST - 2 VIEW  Comparison: 12/19/2011  Findings: Normal heart size and pulmonary vascularity. Calcified tortuous aorta. Lungs appear emphysematous but clear. No pleural effusion or pneumothorax. Question new 12 x 8 mm right lobe nodule. Bones demineralized.  IMPRESSION: Emphysematous changes without acute infiltrate. Question new 12 x 8 mm diameter right upper lobe nodule; CT chest recommended to exclude pulmonary nodule.   Original Report Authenticated By: Ulyses Southward, M.D.    Ct Chest W Contrast  12/19/2012  *RADIOLOGY REPORT*  Clinical Data: Possible right upper lobe lung nodule by chest x- ray.  CT CHEST WITH CONTRAST  Technique:  Multidetector CT imaging of the chest was performed following the standard protocol during bolus administration of intravenous contrast.  Contrast: 80mL OMNIPAQUE IOHEXOL 300 MG/ML  SOLN  Comparison: Two-view chest x-ray earlier today.  Findings: Corresponding to the radiographic abnormality is a discrete solid pulmonary nodule in the posterolateral right upper lobe abutting the major fissure.  This nodule is not calcified and measures approximately 8 x 11 mm.  There is some adjacent mild parenchymal nodularity extending posteriorly.  No associated lymphadenopathy is identified in the chest.  There is no evidence of pleural or pericardial fluid.  The heart size is normal.  Incidental note is made of mild dilatation of the ascending thoracic aorta beginning just above the sinotubular junction. Although diameter of 3.8 - 3.9 cm is not overtly aneurysmal, the ascending thoracic aorta is nearly twice the caliber of the descending thoracic aorta which measures 2.0 cm.  Correlation is suggested electively with any evidence of aortic valvular disease. By CT, there is a mild amount of calcification associated with the posterior aspect of the aortic valve.  The visualized upper abdomen shows no evidence of abnormal masses. No  bony lesions are identified in the chest.  IMPRESSION:  1.  8 x 11 mm solid right upper lobe pulmonary nodule without associated visible lymphadenopathy.  Appropriate workup would include a PET scan to determine if this is a hypermetabolic nodule. 2.  Prominent caliber of the ascending thoracic aorta measuring up to 3.9 cm in greatest diameter.  Recommend elective correlation with the possible presence of underlying aortic valvular disease.   Original Report Authenticated By: Irish Lack, M.D.      Disposition and Follow-up:     Discharge Orders   Future Appointments Provider Department Dept Phone   01/28/2013 11:00 AM Stacie Glaze, MD [facility] [phone]   Future Orders Complete By Expires     Diet - low sodium heart healthy  As directed     Increase activity slowly  As directed         DISPOSITION: Home DIET: Heart healthy diet ACTIVITY: As tolerated   DISCHARGE FOLLOW-UP Follow-up Information   Follow up with Carrie Mew, MD. Schedule an appointment as soon as possible for a visit in 1 week. (please have Dr Lovell Sheehan refer to a pulmonologist for further evaluation of lung nodule )    Contact information:   [address] [place] [facility] [place] [address] [phone]       Follow up with Charlton Haws, MD. Schedule an appointment as soon as possible for a visit in 2 weeks. (for hospital follow-up)    Contact information:   [address] [place] [address] [phone]       Time spent on Discharge:  Signed:   Zailey Audia M.D. [facility] 12/21/2012, 11:00 AM Pager: [phone]

## 2012-12-21 NOTE — Progress Notes (Signed)
Patient ID: Kathleen Thompson, female   DOB: 1934/06/07, 79 y.o.   MRN: [id number]    Subjective:  Denies SSCP, palpitations or Dyspnea   Objective:  Filed Vitals:   12/20/12 2045 12/20/12 2110 12/20/12 2125 12/21/12 0500  BP: 83/42 87/36 89/40  83/51  Pulse: 58 50 50 74  Temp:    98.6 F (37 C)  TempSrc:    Oral  Resp:    18  Height:      Weight:      SpO2:    96%     Physical Exam: Affect appropriate Healthy:  appears stated age HEENT: normal Neck supple with no adenopathy JVP normal no bruits no thyromegaly Lungs clear with no wheezing and good diaphragmatic motion Heart:  S1/S2 no murmur, no rub, gallop or click PMI normal Abdomen: benighn, BS positve, no tenderness, no AAA no bruit.  No HSM or HJR Distal pulses intact with no bruits No edema Neuro non-focal Skin warm and dry No muscular weakness Right radial cath sight A  Lab Results: Basic Metabolic Panel:  Recent Labs  11/91/47 2224 12/20/12 0510  NA 138 137  K 3.5 3.3*  CL 100 100  CO2 27 24  GLUCOSE 107* 115*  BUN 14 9  CREATININE 1.00 0.87  CALCIUM 10.0 9.7   CBC:  Recent Labs  12/18/12 2224 12/20/12 0510  WBC 6.9 5.9  HGB 13.4 13.5  HCT 37.8 38.2  MCV 91.1 89.5  PLT 220 212   Cardiac Enzymes:  Recent Labs  12/19/12 0750 12/19/12 1402 12/19/12 1945  TROPONINI <0.30 <0.30 <0.30   Imaging: Ct Chest W Contrast  12/19/2012  *RADIOLOGY REPORT*  Clinical Data: Possible right upper lobe lung nodule by chest x- ray.  CT CHEST WITH CONTRAST  Technique:  Multidetector CT imaging of the chest was performed following the standard protocol during bolus administration of intravenous contrast.  Contrast: 80mL OMNIPAQUE IOHEXOL 300 MG/ML  SOLN  Comparison: Two-view chest x-ray earlier today.  Findings: Corresponding to the radiographic abnormality is a discrete solid pulmonary nodule in the posterolateral right upper lobe abutting the major fissure.  This nodule is not calcified and measures  approximately 8 x 11 mm.  There is some adjacent mild parenchymal nodularity extending posteriorly.  No associated lymphadenopathy is identified in the chest.  There is no evidence of pleural or pericardial fluid.  The heart size is normal.  Incidental note is made of mild dilatation of the ascending thoracic aorta beginning just above the sinotubular junction. Although diameter of 3.8 - 3.9 cm is not overtly aneurysmal, the ascending thoracic aorta is nearly twice the caliber of the descending thoracic aorta which measures 2.0 cm.  Correlation is suggested electively with any evidence of aortic valvular disease. By CT, there is a mild amount of calcification associated with the posterior aspect of the aortic valve.  The visualized upper abdomen shows no evidence of abnormal masses. No bony lesions are identified in the chest.  IMPRESSION:  1.  8 x 11 mm solid right upper lobe pulmonary nodule without associated visible lymphadenopathy.  Appropriate workup would include a PET scan to determine if this is a hypermetabolic nodule. 2.  Prominent caliber of the ascending thoracic aorta measuring up to 3.9 cm in greatest diameter.  Recommend elective correlation with the possible presence of underlying aortic valvular disease.   Original Report Authenticated By: Irish Lack, M.D.     Cardiac Studies:  ECG:  NSR no acute changes   Telemetry: NSR  no VT 12/21/2012   Echo:   Medications:   . acidophilus  1 capsule Oral Daily  . aspirin EC  81 mg Oral Daily  . atorvastatin  40 mg Oral Daily  . calcium carbonate  1 tablet Oral BID WC  . carvedilol  6.25 mg Oral BID WC  . cholecalciferol  400 Units Oral Daily  . clopidogrel  75 mg Oral Q breakfast  . losartan  100 mg Oral Daily   And  . hydrochlorothiazide  12.5 mg Oral Daily  . isosorbide mononitrate  30 mg Oral Daily  . levothyroxine  75 mcg Oral QAC breakfast  . montelukast  10 mg Oral Daily  . pantoprazole  40 mg Oral Q0600        Assessment/Plan:  CAD:  Normal left sided vessels small RCA  Medical Rx per PJ  Reviewed films and agree.  Continue ASA Plavix ASA beta blockers and nitrates Chol;  Continue statin HTN:  Cotinue ARB stable  Ok to discharge home today  Charlton Haws 12/21/2012, 8:16 AM

## 2012-12-24 ENCOUNTER — Ambulatory Visit (INDEPENDENT_AMBULATORY_CARE_PROVIDER_SITE_OTHER): Payer: Medicare Other | Admitting: Family

## 2012-12-24 ENCOUNTER — Encounter: Payer: Self-pay | Admitting: Family

## 2012-12-24 VITALS — BP 100/60 | HR 75 | Wt 143.0 lb

## 2012-12-24 DIAGNOSIS — R911 Solitary pulmonary nodule: Secondary | ICD-10-CM

## 2012-12-24 DIAGNOSIS — R079 Chest pain, unspecified: Secondary | ICD-10-CM

## 2012-12-24 DIAGNOSIS — I10 Essential (primary) hypertension: Secondary | ICD-10-CM

## 2012-12-24 NOTE — Progress Notes (Signed)
Subjective:    Patient ID: Kathleen Thompson, female    DOB: November 18, 1933, 79 y.o.   MRN: [id number]  HPI 79 year old white female, nonsmoker, patient of Dr. Lovell Sheehan is in as a hospital followup from 12/18/2012. Patient presented to the emergency department with chest pain or shortness of breath that have been ongoing x2 months but have worsened over the last couple days. She had cardiac enzymes drawn that were negative. Had a CT scan of the chest that showed a pulmonary nodule that she now needs a referral to pulmonology to have evaluated. She underwent a cardiac catheterization that was normal. Patient reports that she had been busier the week prior with yard work but no oral other stressors. She sees Dr. Eden Emms  cardiologist. Has an appointment in July.    Review of Systems  Constitutional: Negative.   HENT: Negative.   Respiratory: Negative.   Cardiovascular: Negative.  Negative for chest pain and palpitations.  Gastrointestinal: Negative.   Genitourinary: Negative.   Musculoskeletal: Negative.   Skin: Negative.   Allergic/Immunologic: Negative.   Neurological: Negative.   Hematological: Negative.   Psychiatric/Behavioral: Negative.    Past Medical History  Diagnosis Date  . Internal hemorrhoid   . Diverticulosis   . Gastroparesis   . GERD (gastroesophageal reflux disease)   . Adenomatous colon polyp 02/2003  . Cellulitis and abscess of hand   . Hyperlipidemia   . PVD (peripheral vascular disease)   . Hypothyroidism   . Hypertension   . Iron deficiency anemia   . Esophageal stricture   . Arthritis   . Blood transfusion   . Cataract   . Osteoporosis   . TIA (transient ischemic attack)     History   Social History  . Marital Status: Married    Spouse Name: N/A    Number of Children: N/A  . Years of Education: N/A   Occupational History  . Retired    Social History Main Topics  . Smoking status: Never Smoker   . Smokeless tobacco: Never Used  . Alcohol Use: No   . Drug Use: No  . Sexually Active: Yes   Other Topics Concern  . Not on file   Social History Narrative  . No narrative on file    Past Surgical History  Procedure Laterality Date  . Appendectomy    . Abdominal hysterectomy    . Cervical fusion    . Shoulder surgery       Family History  Problem Relation Age of Onset  . Colon cancer Brother     at age 77  . Colon cancer Sister     at age 72  . Diabetes Mother   . Diabetes Maternal Aunt   . Diabetes Maternal Uncle   . Heart attack Mother   . Heart attack Father   . Heart attack Sister   . Heart attack Brother   . Heart attack Brother   . Heart attack Brother   . Heart attack Brother   . Heart attack Sister     Allergies  Allergen Reactions  . Rofecoxib     hives  . Latex Rash    Pt states that latex causes blisters to skin    Current Outpatient Prescriptions on File Prior to Visit  Medication Sig Dispense Refill  . aspirin 81 MG tablet Take 81 mg by mouth daily.      Marland Kitchen atorvastatin (LIPITOR) 40 MG tablet Take 1 tablet (40 mg total) by mouth daily.  90  tablet  3  . calcium carbonate 200 MG capsule Take 250 mg by mouth 2 (two) times daily with a meal.        . carvedilol (COREG) 6.25 MG tablet Take 1 tablet (6.25 mg total) by mouth 2 (two) times daily.  180 tablet  3  . clopidogrel (PLAVIX) 75 MG tablet Take 1 tablet (75 mg total) by mouth daily.  90 tablet  3  . co-enzyme Q-10 30 MG capsule Take 10 mg by mouth daily.       . isosorbide mononitrate (IMDUR) 30 MG 24 hr tablet Take 1 tablet (30 mg total) by mouth daily.  30 tablet  3  . levothyroxine (SYNTHROID, LEVOTHROID) 75 MCG tablet Take 1 tablet (75 mcg total) by mouth daily.  90 tablet  3  . losartan-hydrochlorothiazide (HYZAAR) 100-12.5 MG per tablet 1/2 tab bid  90 tablet  3  . magnesium 30 MG tablet Take 30 mg by mouth daily.        . montelukast (SINGULAIR) 10 MG tablet Take 1 tablet (10 mg total) by mouth at bedtime.  30 tablet  3  . Probiotic  Product (ALIGN) 4 MG CAPS Take 1 capsule by mouth daily.  30 capsule  0  . traMADol (ULTRAM) 50 MG tablet Take 1 tablet (50 mg total) by mouth every 6 (six) hours as needed for pain.  180 tablet  2  . vitamin D, CHOLECALCIFEROL, 400 UNITS tablet Take 400 Units by mouth daily.        . furosemide (LASIX) 20 MG tablet Take 20 mg by mouth daily as needed.       No current facility-administered medications on file prior to visit.    BP 100/60  Pulse 75  Wt 143 lb (64.864 kg)  BMI 26.15 kg/m2  SpO2 97%chart    Objective:   Physical Exam  Constitutional: She is oriented to person, place, and time. She appears well-developed and well-nourished.  HENT:  Right Ear: External ear normal.  Left Ear: External ear normal.  Nose: Nose normal.  Mouth/Throat: Oropharynx is clear and moist.  Eyes: Conjunctivae are normal. Pupils are equal, round, and reactive to light.  Neck: Normal range of motion. Neck supple. No thyromegaly present.  Cardiovascular: Normal rate, regular rhythm and normal heart sounds.   Pulmonary/Chest: Effort normal and breath sounds normal.  Abdominal: Soft. Bowel sounds are normal.  Musculoskeletal: Normal range of motion. She exhibits no edema and no tenderness.  Neurological: She is alert and oriented to person, place, and time.  Skin: Skin is warm and dry.  Psychiatric: She has a normal mood and affect.          Assessment & Plan:  Assessment:  1. Chest pain 2. Coronary artery disease 3. Shortness of breath 4. Lung nodule  Plan: Followup with cardiology as scheduled. Continue current medications. I want her patient to pulmonology for further management of her lung nodule. Follow up PCP as scheduled. Patient to call the office with any questions or concerns. I have signed her up from a chart today.

## 2012-12-29 ENCOUNTER — Encounter: Payer: Self-pay | Admitting: Internal Medicine

## 2012-12-29 ENCOUNTER — Ambulatory Visit (INDEPENDENT_AMBULATORY_CARE_PROVIDER_SITE_OTHER): Payer: Medicare Other | Admitting: Internal Medicine

## 2012-12-29 VITALS — BP 102/64 | HR 79 | Temp 97.7°F | Ht 61.25 in | Wt 140.6 lb

## 2012-12-29 DIAGNOSIS — R911 Solitary pulmonary nodule: Secondary | ICD-10-CM

## 2012-12-29 DIAGNOSIS — R0989 Other specified symptoms and signs involving the circulatory and respiratory systems: Secondary | ICD-10-CM

## 2012-12-29 DIAGNOSIS — R0600 Dyspnea, unspecified: Secondary | ICD-10-CM

## 2012-12-29 MED ORDER — PANTOPRAZOLE SODIUM 40 MG PO TBEC: 40.0000 mg | DELAYED_RELEASE_TABLET | Freq: Every day | ORAL | Status: DC

## 2012-12-29 NOTE — Progress Notes (Signed)
  Subjective:    Patient ID: Kathleen Thompson, female    DOB: [birth year]  MRN: [id number]  HPI  3 yowf never smoker with shortness of breath since 2013 eval with Cxr/ ct chest showing R mild lung nodule   12/29/2012 1st pulmonary eval cc sob x one year, worse x one month, present daily gone within a couple of deep breaths not reproducible with exertion or sleeping,  Worse in cold or wind or hot humid weather. Tried on singulair and not better.  No obvious other patterns daytime variabilty or assoc chronic cough or cp or chest tightness, subjective wheeze overt sinus or hb symptoms. No unusual exp hx or h/o childhood pna/ asthma or premature birth to her knowledge.  Sleeping ok without nocturnal  or early am exacerbation  of respiratory  c/o's or need for noct saba. Also denies any obvious fluctuation of symptoms with weather or environmental changes or other aggravating or alleviating factors except as outlined above    Review of Systems  Constitutional: Negative for fever, chills and unexpected weight change.  HENT: Negative for ear pain, nosebleeds, congestion, sore throat, rhinorrhea, sneezing, trouble swallowing, dental problem, voice change, postnasal drip and sinus pressure.   Eyes: Negative for visual disturbance.  Respiratory: Positive for shortness of breath. Negative for cough and choking.   Cardiovascular: Negative for chest pain and leg swelling.  Gastrointestinal: Negative for vomiting, abdominal pain and diarrhea.  Genitourinary: Negative for difficulty urinating.  Musculoskeletal: Negative for arthralgias.  Skin: Negative for rash.  Neurological: Negative for tremors, syncope and headaches.  Hematological: Does not bruise/bleed easily.       Objective:   Physical Exam slt hoarse anixous who failed to answer a single question asked in a straightforward manner, tending to go off on tangents or answer questions with ambiguous medical terms or diagnoses and seemed aggravated   when asked the same question more than once for clarification.   Wt Readings from Last 3 Encounters:  12/29/12 140 lb 9.6 oz (63.776 kg)  12/24/12 143 lb (64.864 kg)  12/19/12 138 lb 3.2 oz (62.687 kg)    HEENT: nl dentition, turbinates, and orophanx. Nl external ear canals without cough reflex   NECK :  without JVD/Nodes/TM/ nl carotid upstrokes bilaterally   LUNGS: no acc muscle use, clear to A and P bilaterally without cough on insp or exp maneuvers   CV:  RRR  no s3 or murmur or increase in P2, no edema   ABD:  soft and nontender with nl excursion in the supine position. No bruits or organomegaly, bowel sounds nl  MS:  warm without deformities, calf tenderness, cyanosis or clubbing  SKIN: warm and dry without lesions    NEURO:  alert, approp, no deficits   12/19/12 cta chest 1. 8 x 11 mm solid right upper lobe pulmonary nodule without  associated visible lymphadenopathy. Appropriate workup would  include a PET scan to determine if this is a hypermetabolic nodule.  2. Prominent caliber of the ascending thoracic aorta measuring up  to 3.9 cm in greatest diameter. Recommend elective correlation  with the possible presence of underlying aortic valvular disease.      Assessment & Plan:

## 2012-12-29 NOTE — Patient Instructions (Addendum)
GERD (REFLUX)  is an extremely common cause of respiratory symptoms, many times with no significant heartburn at all.    It can be treated with medication, but also with lifestyle changes including avoidance of late meals, excessive alcohol, smoking cessation, and avoid fatty foods, chocolate, peppermint, colas, red wine, and acidic juices such as orange juice.  NO MINT OR MENTHOL PRODUCTS SO NO COUGH DROPS  USE SUGARLESS CANDY INSTEAD (jolley ranchers or Stover's)  NO OIL BASED VITAMINS - use powdered substitutes.   Pantoprazole (protonix) 40 mg Take 30-60 min before first meal of the day  until return to office   Please schedule a follow up office visit in 6 weeks, call sooner if needed with follow up cxr

## 2012-12-30 NOTE — Assessment & Plan Note (Signed)
-   Chest CT 12/19/12   Reviewed with pt ? H/o pna (she can't recall) but most likely in a never smoker this will prove to be benign but the only way to prove it now is remove it, which she declined  Discussed in detail all the  indications, usual  risks and alternatives  relative to the benefits with patient who agrees to proceed with conservative f/u for now with cxr since can be seen easily on cxr and excisional bx if shows growth.

## 2012-12-30 NOTE — Assessment & Plan Note (Signed)
Not reproducible with exertion, gone within a breath or two typical of hyperventilation syndrome

## 2013-01-28 ENCOUNTER — Other Ambulatory Visit: Payer: Self-pay | Admitting: *Deleted

## 2013-01-28 ENCOUNTER — Ambulatory Visit (INDEPENDENT_AMBULATORY_CARE_PROVIDER_SITE_OTHER): Payer: Medicare Other | Admitting: Internal Medicine

## 2013-01-28 ENCOUNTER — Encounter: Payer: Self-pay | Admitting: Internal Medicine

## 2013-01-28 VITALS — BP 110/70 | HR 72 | Temp 98.3°F | Resp 16 | Ht 62.0 in | Wt 140.0 lb

## 2013-01-28 DIAGNOSIS — K219 Gastro-esophageal reflux disease without esophagitis: Secondary | ICD-10-CM

## 2013-01-28 DIAGNOSIS — I10 Essential (primary) hypertension: Secondary | ICD-10-CM

## 2013-01-28 MED ORDER — PANTOPRAZOLE SODIUM 40 MG PO TBEC: 40.0000 mg | DELAYED_RELEASE_TABLET | Freq: Every day | ORAL | Status: DC

## 2013-01-28 NOTE — Progress Notes (Signed)
Subjective:    Patient ID: Kathleen Thompson, female    DOB: 11-12-1933, 79 y.o.   MRN: [id number]  HPI Follow up for pulmonary nodule seen on heart cath Stable CAD Stable GERD History of esophageal stricture Chest pain syndrome secondary to GERD Long tern PPI used recommended   Review of Systems  Constitutional: Negative for activity change, appetite change and fatigue.  HENT: Negative for ear pain, congestion, neck pain, postnasal drip and sinus pressure.   Eyes: Negative for redness and visual disturbance.  Respiratory: Negative for cough, shortness of breath and wheezing.   Gastrointestinal: Negative for abdominal pain and abdominal distention.  Genitourinary: Negative for dysuria, frequency and menstrual problem.  Musculoskeletal: Negative for myalgias, joint swelling and arthralgias.  Skin: Negative for rash and wound.  Neurological: Negative for dizziness, weakness and headaches.  Hematological: Negative for adenopathy. Does not bruise/bleed easily.  Psychiatric/Behavioral: Negative for sleep disturbance and decreased concentration.   Past Medical History  Diagnosis Date  . Internal hemorrhoid   . Diverticulosis   . Gastroparesis   . GERD (gastroesophageal reflux disease)   . Adenomatous colon polyp 02/2003  . Cellulitis and abscess of hand   . Hyperlipidemia   . PVD (peripheral vascular disease)   . Hypothyroidism   . Hypertension   . Iron deficiency anemia   . Esophageal stricture   . Arthritis   . Blood transfusion   . Cataract   . Osteoporosis   . TIA (transient ischemic attack)     History   Social History  . Marital Status: Married    Spouse Name: N/A    Number of Children: N/A  . Years of Education: N/A   Occupational History  . Retired-[employer]     Social History Main Topics  . Smoking status: Never Smoker   . Smokeless tobacco: Never Used  . Alcohol Use: No  . Drug Use: No  . Sexually Active: Yes   Other Topics Concern  . Not on file    Social History Narrative  . No narrative on file    Past Surgical History  Procedure Laterality Date  . Appendectomy    . Abdominal hysterectomy    . Cervical fusion    . Shoulder surgery       Family History  Problem Relation Age of Onset  . Colon cancer Brother     at age 68  . Colon cancer Sister     at age 37  . Diabetes Mother   . Diabetes Maternal Aunt   . Diabetes Maternal Uncle   . Heart attack Mother   . Heart attack Father   . Heart attack Sister   . Heart attack Brother   . Heart attack Brother   . Heart attack Brother   . Heart attack Brother   . Heart attack Sister   . Asthma Father     Allergies  Allergen Reactions  . Rofecoxib     hives  . Latex Rash    Pt states that latex causes blisters to skin    Current Outpatient Prescriptions on File Prior to Visit  Medication Sig Dispense Refill  . aspirin 81 MG tablet Take 81 mg by mouth daily.      Marland Kitchen atorvastatin (LIPITOR) 40 MG tablet Take 1 tablet (40 mg total) by mouth daily.  90 tablet  3  . calcium carbonate 200 MG capsule Take 250 mg by mouth 2 (two) times daily with a meal.        .  carvedilol (COREG) 6.25 MG tablet Take 1 tablet (6.25 mg total) by mouth 2 (two) times daily.  180 tablet  3  . clopidogrel (PLAVIX) 75 MG tablet Take 1 tablet (75 mg total) by mouth daily.  90 tablet  3  . co-enzyme Q-10 30 MG capsule Take 10 mg by mouth daily.       . furosemide (LASIX) 20 MG tablet Take 20 mg by mouth daily as needed.      . isosorbide mononitrate (IMDUR) 30 MG 24 hr tablet Take 1 tablet (30 mg total) by mouth daily.  30 tablet  3  . levothyroxine (SYNTHROID, LEVOTHROID) 75 MCG tablet Take 1 tablet (75 mcg total) by mouth daily.  90 tablet  3  . losartan-hydrochlorothiazide (HYZAAR) 100-12.5 MG per tablet 1/2 tab twice daily      . magnesium 30 MG tablet Take 30 mg by mouth daily.        . pantoprazole (PROTONIX) 40 MG tablet Take 1 tablet (40 mg total) by mouth daily. Take 30-60 min before first  meal of the day  30 tablet  2  . Probiotic Product (ALIGN) 4 MG CAPS Take 1 capsule by mouth daily.  30 capsule  0  . traMADol (ULTRAM) 50 MG tablet Take 1 tablet (50 mg total) by mouth every 6 (six) hours as needed for pain.  180 tablet  2  . vitamin D, CHOLECALCIFEROL, 400 UNITS tablet Take 400 Units by mouth daily.         No current facility-administered medications on file prior to visit.    BP 110/70  Pulse 72  Temp(Src) 98.3 F (36.8 C)  Resp 16  Ht 5\' 2"  (1.575 m)  Wt 140 lb (63.504 kg)  BMI 25.6 kg/m2       Objective:   Physical Exam  Nursing note and vitals reviewed. Constitutional: She is oriented to person, place, and time. She appears well-developed and well-nourished. No distress.  HENT:  Head: Normocephalic and atraumatic.  Eyes: Conjunctivae and EOM are normal. Pupils are equal, round, and reactive to light.  Neck: Normal range of motion. Neck supple. No JVD present. No tracheal deviation present. No thyromegaly present.  Cardiovascular: Normal rate and regular rhythm.   Murmur heard. Pulmonary/Chest: Effort normal and breath sounds normal. She has no wheezes. She exhibits no tenderness.  Abdominal: Soft. Bowel sounds are normal.  Musculoskeletal: Normal range of motion. She exhibits no edema and no tenderness.  Lymphadenopathy:    She has no cervical adenopathy.  Neurological: She is alert and oriented to person, place, and time. She has normal reflexes. No cranial nerve deficit.  Skin: Skin is warm and dry. She is not diaphoretic.  Psychiatric: She has a normal mood and affect. Her behavior is normal.          Assessment & Plan:  Kathleen Thompson is a 79 year old female had a recent cardiac catheterization which showed no progression of disease had a chest pain syndrome which is believed to be related to gastroesophageal reflux.  Had a single pulmonary nodule noted on procedure followup with pulmonary for single nodule will be conservative followup with  monitoring of nodule size there is no detected growth and monitoring had a yearly chest x-ray would be appropriate.  Patient has a history of asthma has a history of bronchitis I believe that this represents an old infection.

## 2013-02-08 ENCOUNTER — Encounter: Payer: Medicare Other | Admitting: Cardiovascular Disease

## 2013-02-09 ENCOUNTER — Encounter: Payer: Self-pay | Admitting: Internal Medicine

## 2013-02-09 ENCOUNTER — Ambulatory Visit (INDEPENDENT_AMBULATORY_CARE_PROVIDER_SITE_OTHER): Payer: Medicare Other | Admitting: Internal Medicine

## 2013-02-09 ENCOUNTER — Ambulatory Visit (INDEPENDENT_AMBULATORY_CARE_PROVIDER_SITE_OTHER)
Admission: RE | Admit: 2013-02-09 | Discharge: 2013-02-09 | Disposition: A | Discharge status: Home / Self Care | Payer: Medicare Other | Source: Ambulatory Visit | Attending: Internal Medicine | Admitting: Internal Medicine

## 2013-02-09 ENCOUNTER — Other Ambulatory Visit: Payer: Self-pay | Admitting: Internal Medicine

## 2013-02-09 VITALS — BP 102/62 | HR 73 | Temp 98.1°F | Ht 61.25 in | Wt 141.0 lb

## 2013-02-09 DIAGNOSIS — R0600 Dyspnea, unspecified: Secondary | ICD-10-CM

## 2013-02-09 DIAGNOSIS — J449 Chronic obstructive pulmonary disease, unspecified: Secondary | ICD-10-CM

## 2013-02-09 DIAGNOSIS — R911 Solitary pulmonary nodule: Secondary | ICD-10-CM

## 2013-02-09 DIAGNOSIS — K219 Gastro-esophageal reflux disease without esophagitis: Secondary | ICD-10-CM

## 2013-02-09 DIAGNOSIS — R0989 Other specified symptoms and signs involving the circulatory and respiratory systems: Secondary | ICD-10-CM

## 2013-02-09 NOTE — Patient Instructions (Addendum)
I will contact Dr Lovell Sheehan re a follow limited CT of your lung to be done 1st week in November and there is any growth in the nodule it nodule it will need to be removed.  Late add: After reviewed cxr rec proceeding with pet now and she agreed

## 2013-02-09 NOTE — Progress Notes (Signed)
Subjective:    Patient ID: Kathleen Thompson, female    DOB: 1934/07/29  MRN: [id number]   Brief patient profile:  74 yowf never smoker with shortness of breath since 2013 eval with Cxr/ ct chest showing R mild lung nodule   12/29/2012 1st pulmonary eval cc sob x one year, worse x one month, present daily gone within a couple of deep breaths not reproducible with exertion or sleeping,  Worse in cold or wind or hot humid weather. Tried on singulair and not better. rec GERD   Pantoprazole (protonix) 40 mg Take 30-60 min before first meal of the day  until return to office       02/09/2013 f/u ov/Jaxxon Naeem re sob/ spn Chief Complaint  Patient presents with  . Follow-up    Pt states her breathing is the same- no better or worse since her last visit. No new co's today.   initially only 5 min > now able to ex for an hour s stopping due to  Sob "but my breathing is the same"   No obvious  daytime variabilty or assoc chronic cough or cp or chest tightness, subjective wheeze overt sinus or hb symptoms. No unusual exp hx or h/o childhood pna/ asthma or premature birth to her knowledge.  Sleeping ok without nocturnal  or early am exacerbation  of respiratory  c/o's or need for noct saba. Also denies any obvious fluctuation of symptoms with weather or environmental changes or other aggravating or alleviating factors except as outlined above    Current Medications, Allergies, Past Medical History, Past Surgical History, Family History, and Social History were reviewed in [employer] [facility] record.  ROS  The following are not active complaints unless bolded sore throat, dysphagia, dental problems, itching, sneezing,  nasal congestion or excess/ purulent secretions, ear ache,   fever, chills, sweats, unintended wt loss, pleuritic or exertional cp, hemoptysis,  orthopnea pnd or leg swelling, presyncope, palpitations, heartburn, abdominal pain, anorexia, nausea, vomiting, diarrhea  or change in  bowel or urinary habits, change in stools or urine, dysuria,hematuria,  rash, arthralgias, visual complaints, headache, numbness weakness or ataxia or problems with walking or coordination,  change in mood/affect or memory.           Objective:   Physical Exam   slt hoarse amb anixous wf nad  02/09/2013          141  Wt Readings from Last 3 Encounters:  12/29/12 140 lb 9.6 oz (63.776 kg)  12/24/12 143 lb (64.864 kg)  12/19/12 138 lb 3.2 oz (62.687 kg)    HEENT: nl dentition, turbinates, and orophanx. Nl external ear canals without cough reflex   NECK :  without JVD/Nodes/TM/ nl carotid upstrokes bilaterally   LUNGS: no acc muscle use, clear to A and P bilaterally without cough on insp or exp maneuvers   CV:  RRR  no s3 or murmur or increase in P2, no edema   ABD:  soft and nontender with nl excursion in the supine position. No bruits or organomegaly, bowel sounds nl  MS:  warm without deformities, calf tenderness, cyanosis or clubbing  SKIN: warm and dry without lesions    NEURO:  alert, approp, no deficits   12/19/12 cta chest 1. 8 x 11 mm solid right upper lobe pulmonary nodule without  associated visible lymphadenopathy. Appropriate workup would  include a PET scan to determine if this is a hypermetabolic nodule.  2. Prominent caliber of the ascending thoracic aorta measuring  up  to 3.9 cm in greatest diameter. Recommend elective correlation  with the possible presence of underlying aortic valvular disease.  02/09/13 cxr Again noted a nodule in  the right upper lobe measures 14 x 8.5 mm. On the prior exam  measures 11.8 x 7.5 mm. Slight progression in size.      Assessment & Plan:

## 2013-02-09 NOTE — Progress Notes (Signed)
Quick Note:  Spoke with pt and notified of results per Dr. Wert. Pt verbalized understanding and denied any questions.  ______ 

## 2013-02-13 NOTE — Assessment & Plan Note (Signed)
Adequate control on present rx, reviewed need to understand the extra-esophageal effects of gerd on the upper airway which may cause sob or cough or look like asthma > should continue max rx for now plus diet.

## 2013-02-13 NOTE — Assessment & Plan Note (Signed)
Has improved to point where can now walk for an hour p rx for gerd > continue

## 2013-02-13 NOTE — Addendum Note (Signed)
Addended by: Sandrea Hughs B on: 02/13/2013 06:51 AM   Modules accepted: Orders, Medications

## 2013-02-13 NOTE — Assessment & Plan Note (Signed)
-   Chest CT 12/19/12  - PET rec 02/09/2013 >>>  slt growth by plain cxr > Discussed in detail all the  indications, usual  risks and alternatives  relative to the benefits with patient who agrees to proceed with pet and consideration for excisional bx

## 2013-02-17 ENCOUNTER — Encounter (HOSPITAL_COMMUNITY)
Admission: RE | Admit: 2013-02-17 | Discharge: 2013-02-17 | Disposition: A | Discharge status: Home / Self Care | Payer: Medicare Other | Source: Ambulatory Visit | Attending: Internal Medicine | Admitting: Internal Medicine

## 2013-02-17 ENCOUNTER — Encounter (HOSPITAL_COMMUNITY): Payer: Self-pay

## 2013-02-17 DIAGNOSIS — R911 Solitary pulmonary nodule: Secondary | ICD-10-CM | POA: Insufficient documentation

## 2013-02-17 MED ORDER — FLUDEOXYGLUCOSE F - 18 (FDG) INJECTION
20.4000 | Freq: Once | INTRAVENOUS | Status: AC | PRN
  Administered 2013-02-17: 20.4 via INTRAVENOUS

## 2013-02-18 ENCOUNTER — Encounter: Payer: Self-pay | Admitting: Internal Medicine

## 2013-02-21 ENCOUNTER — Encounter: Payer: Self-pay | Admitting: Internal Medicine

## 2013-02-21 ENCOUNTER — Other Ambulatory Visit: Payer: Self-pay | Admitting: Internal Medicine

## 2013-02-21 DIAGNOSIS — R911 Solitary pulmonary nodule: Secondary | ICD-10-CM

## 2013-02-21 NOTE — Assessment & Plan Note (Signed)
-   First detected 12/19/2012 on plain cxr (not seen on comparison cxr 12/2011) - Chest CT 12/19/12  8 x 11   - PET  02/17/13 > 1.5 cm isolated 3.6 suv > rec T surgery referral 02/21/2013 >>>

## 2013-03-03 ENCOUNTER — Encounter: Payer: Self-pay | Admitting: *Deleted

## 2013-03-03 ENCOUNTER — Other Ambulatory Visit: Payer: Self-pay | Admitting: *Deleted

## 2013-03-03 ENCOUNTER — Institutional Professional Consult (permissible substitution) (INDEPENDENT_AMBULATORY_CARE_PROVIDER_SITE_OTHER): Payer: Medicare Other | Admitting: Thoracic Surgery (Cardiothoracic Vascular Surgery)

## 2013-03-03 DIAGNOSIS — R222 Localized swelling, mass and lump, trunk: Secondary | ICD-10-CM

## 2013-03-03 DIAGNOSIS — R918 Other nonspecific abnormal finding of lung field: Secondary | ICD-10-CM

## 2013-03-03 NOTE — Progress Notes (Signed)
PCP is JENKINS,JOHN EDWARD, MD Referring Provider is Jenkins, John E, MD, WERT, MICHAEL, MD  No chief complaint on file.   HPI: 79 yo woman with a cc/o a ling nodule.  Mrs. Lagrow is a 79 yo life-long nonsmoker. She presented in May with c/o severe chest tightness radiating through to her back and accompanied by wheezing. She was evaluated with cardiac catheterization which showed single vessel CAD involving a small RCA and was not felt to be the cause of her pain. She also had a CXR which showed a right lung nodule. A CT showed an 8 x 11 mm RUL nodule. She was seen by Dr. Wert and followed up with an interval PET/ CT which showed an increase in size of the mass, which was also hypermetabolic.  Of note, she was diagnosed with reflux back in May as the cause of her CP. She has not had any CP or SOB since being treated with protonix. She has never smoked. She is retired but remains active.  ZUBROD/ ECOG score= 0     Past Medical History  Diagnosis Date  . Internal hemorrhoid   . Diverticulosis   . Gastroparesis   . GERD (gastroesophageal reflux disease)   . Adenomatous colon polyp 02/2003  . Cellulitis and abscess of hand   . Hyperlipidemia   . PVD (peripheral vascular disease)   . Hypothyroidism   . Hypertension   . Iron deficiency anemia   . Esophageal stricture   . Arthritis   . Blood transfusion   . Cataract   . Osteoporosis   . TIA (transient ischemic attack)     Past Surgical History  Procedure Laterality Date  . Appendectomy    . Abdominal hysterectomy    . Cervical fusion    . Shoulder surgery       Family History  Problem Relation Age of Onset  . Colon cancer Brother     at age 62  . Colon cancer Sister     at age 73  . Diabetes Mother   . Diabetes Maternal Aunt   . Diabetes Maternal Uncle   . Heart attack Mother   . Heart attack Father   . Heart attack Sister   . Heart attack Brother   . Heart attack Brother   . Heart attack Brother   . Heart attack  Brother   . Heart attack Sister   . Asthma Father     Social History History  Substance Use Topics  . Smoking status: Never Smoker   . Smokeless tobacco: Never Used  . Alcohol Use: No    Current Outpatient Prescriptions  Medication Sig Dispense Refill  . aspirin 81 MG tablet Take 81 mg by mouth daily.      . atorvastatin (LIPITOR) 40 MG tablet Take 1 tablet (40 mg total) by mouth daily.  90 tablet  3  . calcium carbonate 200 MG capsule Take 250 mg by mouth 2 (two) times daily with a meal.        . carvedilol (COREG) 6.25 MG tablet Take 1 tablet (6.25 mg total) by mouth 2 (two) times daily.  180 tablet  3  . clopidogrel (PLAVIX) 75 MG tablet Take 1 tablet (75 mg total) by mouth daily.  90 tablet  3  . co-enzyme Q-10 30 MG capsule Take 10 mg by mouth daily.       . furosemide (LASIX) 20 MG tablet Take 20 mg by mouth daily as needed.      .   isosorbide mononitrate (IMDUR) 30 MG 24 hr tablet Take 1 tablet (30 mg total) by mouth daily.  30 tablet  3  . levothyroxine (SYNTHROID, LEVOTHROID) 75 MCG tablet Take 1 tablet (75 mcg total) by mouth daily.  90 tablet  3  . losartan-hydrochlorothiazide (HYZAAR) 100-12.5 MG per tablet 1/2 tab twice daily      . magnesium 30 MG tablet Take 30 mg by mouth daily.        . pantoprazole (PROTONIX) 40 MG tablet Take 1 tablet (40 mg total) by mouth daily. Take 30-60 min before first meal of the day  90 tablet  3  . Probiotic Product (ALIGN) 4 MG CAPS Take 1 capsule by mouth daily.  30 capsule  0  . traMADol (ULTRAM) 50 MG tablet Take 1 tablet (50 mg total) by mouth every 6 (six) hours as needed for pain.  180 tablet  2  . vitamin D, CHOLECALCIFEROL, 400 UNITS tablet Take 400 Units by mouth daily.         No current facility-administered medications for this visit.    Allergies  Allergen Reactions  . Rofecoxib     hives  . Latex Rash    Pt states that latex causes blisters to skin    Review of Systems  Constitutional: Negative for fever, chills and  unexpected weight change.  Respiratory: Positive for cough, shortness of breath and wheezing.   Cardiovascular: Positive for chest pain.  Gastrointestinal:       Severe reflux- controlled with protonix  Genitourinary: Negative.   Neurological:       History of ministrokes- no recent strokes  Hematological: Bruises/bleeds easily (on plavix).  All other systems reviewed and are negative.    There were no vitals taken for this visit. Physical Exam  Vitals reviewed. Constitutional: She is oriented to person, place, and time. She appears well-developed and well-nourished. No distress.  HENT:  Head: Normocephalic and atraumatic.  Eyes: EOM are normal. Pupils are equal, round, and reactive to light.  Neck: Neck supple. No thyromegaly present.  Cardiovascular: Normal rate, regular rhythm and intact distal pulses.  Exam reveals no gallop and no friction rub.   No murmur heard. Pulmonary/Chest: Effort normal and breath sounds normal. She has no wheezes. She has no rales.  Abdominal: Soft. There is tenderness.  Musculoskeletal: She exhibits no edema.  Lymphadenopathy:    She has no cervical adenopathy.  Neurological: She is alert and oriented to person, place, and time. No cranial nerve deficit.  Skin: Skin is warm and dry.     Diagnostic Tests:  CT Chest 12/19/12  *RADIOLOGY REPORT*  Clinical Data: Possible right upper lobe lung nodule by chest x-  ray.  CT CHEST WITH CONTRAST  Technique: Multidetector CT imaging of the chest was performed  following the standard protocol during bolus administration of  intravenous contrast.  Contrast: 80mL OMNIPAQUE IOHEXOL 300 MG/ML SOLN  Comparison: Two-view chest x-ray earlier today.  Findings: Corresponding to the radiographic abnormality is a  discrete solid pulmonary nodule in the posterolateral right upper  lobe abutting the major fissure. This nodule is not calcified and  measures approximately 8 x 11 mm. There is some adjacent mild   parenchymal nodularity extending posteriorly.  No associated lymphadenopathy is identified in the chest. There is  no evidence of pleural or pericardial fluid. The heart size is  normal.  Incidental note is made of mild dilatation of the ascending  thoracic aorta beginning just above the sinotubular junction.    Although diameter of 3.8 - 3.9 cm is not overtly aneurysmal, the  ascending thoracic aorta is nearly twice the caliber of the  descending thoracic aorta which measures 2.0 cm. Correlation is  suggested electively with any evidence of aortic valvular disease.  By CT, there is a mild amount of calcification associated with the  posterior aspect of the aortic valve.  The visualized upper abdomen shows no evidence of abnormal masses.  No bony lesions are identified in the chest.  IMPRESSION:  1. 8 x 11 mm solid right upper lobe pulmonary nodule without  associated visible lymphadenopathy. Appropriate workup would  include a PET scan to determine if this is a hypermetabolic nodule.  2. Prominent caliber of the ascending thoracic aorta measuring up  to 3.9 cm in greatest diameter. Recommend elective correlation  with the possible presence of underlying aortic valvular disease.  Original Report Authenticated By: Glenn Yamagata, M.D.   PET/Ct 02/17/13 *RADIOLOGY REPORT*  Clinical Data: Recent imaging demonstrates a solitary pulmonary  nodule. FDG PET CT requested to evaluate for possible malignancy.  NUCLEAR MEDICINE PET SKULL BASE TO THIGH  Fasting Blood Glucose: 126  Technique: Is 20.4 mCi F-18 FDG was injected intravenously. CT  data was obtained and used for attenuation correction and anatomic  localization only. (This was not acquired as a diagnostic CT  examination.) Additional exam technical data entered on  technologist worksheet.  Comparison: Chest CT on 12/19/2012  Findings:  Head/Neck: No hypermetabolic lymph nodes in the neck.  Chest: No hypermetabolic mediastinal or  hilar nodes. Poorly  defined 1.5 cm nodular density in the lateral right upper lobe  shows hypermetabolic activity, with maximum SUV of 3.6. This is  suspicious for primary bronchogenic carcinoma. No other suspicious  pulmonary nodules masses are identified.  Abdomen/Pelvis: No abnormal hypermetabolic activity within the  liver, pancreas, adrenal glands, or spleen. No hypermetabolic  lymph nodes in the abdomen or pelvis.  Skeleton: No focal hypermetabolic activity to suggest skeletal  metastasis.  IMPRESSION:  1. Mildly hypermetabolic 1.5 cm right upper lobe pulmonary nodule,  suspicious for primary bronchogenic carcinoma.  2. No evidence of nodal or distant metastatic disease.  Original Report Authenticated By: John Stahl, M.D.  Impression: 78 yo nonsmoker incidentally found to have a right upper lobe nodule during a workup for CP this Spring. On follow up the mass has grown and is hypermetabolic arousing suspicion that it is a lung cancer.   I reviewed the films with Mr. And Mrs. Thinnes. We discussed the differential diagnosis and possible therapeutic approaches. In my opinion this needs to be considered a lung cancer unless it can be proven otherwise. The only way to definitively prove it is not cancerous is with an excisional biopsy.  They understand that needle or bronchoscopic biopsies have a significant false negative rate and cannot be relied upon as definitive proof.  I recommended that she undergo a Right VATS, wedge resection, and possible right upper lobectomy(if frozen + for cancer). I discussed with them the indications, risks, benefits and alternatives. They understand the risks include, but are not limited to, death, MI, DVT, PE, bleeding, possible need for transfusion, infection, prolonged air leak, as well as other unforeseeable complications.  She accepts the risks and agrees to proceed.   She will need PFTs but I don't expect them to be an issue  Plan: Right VATS,  wedge resection, possible lobectomy on Monday 03/14/13 

## 2013-03-04 ENCOUNTER — Encounter (HOSPITAL_COMMUNITY): Payer: Self-pay | Admitting: Pharmacy Technician

## 2013-03-09 ENCOUNTER — Encounter: Payer: Self-pay | Admitting: Cardiovascular Disease

## 2013-03-10 ENCOUNTER — Other Ambulatory Visit: Payer: Self-pay

## 2013-03-10 ENCOUNTER — Encounter (HOSPITAL_COMMUNITY)
Admission: RE | Admit: 2013-03-10 | Discharge: 2013-03-10 | Disposition: A | Discharge status: Home / Self Care | Payer: Medicare Other | Source: Ambulatory Visit | Attending: Thoracic Surgery (Cardiothoracic Vascular Surgery) | Admitting: Thoracic Surgery (Cardiothoracic Vascular Surgery)

## 2013-03-10 ENCOUNTER — Inpatient Hospital Stay (HOSPITAL_COMMUNITY)
Admission: RE | Admit: 2013-03-10 | Discharge: 2013-03-10 | Disposition: A | Discharge status: Home / Self Care | Payer: Medicare Other | Source: Ambulatory Visit

## 2013-03-10 ENCOUNTER — Encounter (HOSPITAL_COMMUNITY): Payer: Self-pay

## 2013-03-10 VITALS — BP 113/75 | HR 80 | Temp 98.6°F | Resp 18 | Ht 61.5 in | Wt 139.7 lb

## 2013-03-10 DIAGNOSIS — D381 Neoplasm of uncertain behavior of trachea, bronchus and lung: Secondary | ICD-10-CM

## 2013-03-10 DIAGNOSIS — Z01811 Encounter for preprocedural respiratory examination: Secondary | ICD-10-CM | POA: Insufficient documentation

## 2013-03-10 DIAGNOSIS — Z01810 Encounter for preprocedural cardiovascular examination: Secondary | ICD-10-CM | POA: Insufficient documentation

## 2013-03-10 DIAGNOSIS — R918 Other nonspecific abnormal finding of lung field: Secondary | ICD-10-CM

## 2013-03-10 DIAGNOSIS — R222 Localized swelling, mass and lump, trunk: Secondary | ICD-10-CM | POA: Insufficient documentation

## 2013-03-10 DIAGNOSIS — Z01812 Encounter for preprocedural laboratory examination: Secondary | ICD-10-CM | POA: Insufficient documentation

## 2013-03-10 HISTORY — DX: Cerebral infarction, unspecified: I63.9

## 2013-03-10 LAB — URINALYSIS, ROUTINE W REFLEX MICROSCOPIC
Ketones, ur: NEGATIVE mg/dL
Nitrite: NEGATIVE
Protein, ur: NEGATIVE mg/dL

## 2013-03-10 LAB — ABO/RH: ABO/RH(D): O POS

## 2013-03-10 LAB — CBC
MCV: 89.6 fL (ref 78.0–100.0)
Platelets: 212 10*3/uL (ref 150–400)
RBC: 4.23 MIL/uL (ref 3.87–5.11)
RDW: 12.8 % (ref 11.5–15.5)
WBC: 5.2 10*3/uL (ref 4.0–10.5)

## 2013-03-10 LAB — BLOOD GAS, ARTERIAL
Drawn by: 181601
FIO2: 0.21 %
pCO2 arterial: 37.3 mmHg (ref 35.0–45.0)
pH, Arterial: 7.41 (ref 7.350–7.450)
pO2, Arterial: 98.7 mmHg (ref 80.0–100.0)

## 2013-03-10 LAB — COMPREHENSIVE METABOLIC PANEL
ALT: 15 U/L (ref 0–35)
AST: 24 U/L (ref 0–37)
Albumin: 3.9 g/dL (ref 3.5–5.2)
CO2: 21 mEq/L (ref 19–32)
Calcium: 9.3 mg/dL (ref 8.4–10.5)
Chloride: 100 mEq/L (ref 96–112)
Creatinine, Ser: 0.8 mg/dL (ref 0.50–1.10)
GFR calc non Af Amer: 69 mL/min — ABNORMAL LOW (ref >90)
Sodium: 135 mEq/L (ref 135–145)
Total Bilirubin: 0.8 mg/dL (ref 0.3–1.2)

## 2013-03-10 LAB — APTT: aPTT: 27 seconds (ref 24–37)

## 2013-03-10 LAB — PROTIME-INR: INR: 0.97 (ref 0.00–1.49)

## 2013-03-10 LAB — TYPE AND SCREEN: ABO/RH(D): O POS

## 2013-03-10 LAB — PULMONARY FUNCTION TEST

## 2013-03-10 LAB — SURGICAL PCR SCREEN: MRSA, PCR: NEGATIVE

## 2013-03-10 MED ORDER — ALBUTEROL SULFATE (5 MG/ML) 0.5% IN NEBU
2.5000 mg | INHALATION_SOLUTION | Freq: Once | RESPIRATORY_TRACT | Status: AC
  Administered 2013-03-10: 2.5 mg via RESPIRATORY_TRACT

## 2013-03-10 NOTE — Pre-Procedure Instructions (Signed)
Kathleen Thompson  03/10/2013   Your procedure is scheduled [id number], March 14, 2013  Report to [facility] at 5:30 AM.  Call this number if you have problems the morning of surgery: [phone]   Remember:   Do not eat food or drink liquids after midnight.   Take these medicines the morning of surgery with A SIP OF WATER: carvedilol (COREG) 6.25 MG tablet, isosorbide mononitrate (IMDUR) 30 MG 24 hr tablet, levothyroxine (SYNTHROID, LEVOTHROID) 75 MCG tablet,  pantoprazole (PROTONIX) 40 MG tablet, if needed: traMADol (ULTRAM) 50 MG tablet for pain Stop taking Aspirin, Coumadin, Plavix, and herbal medications (Probiotic Product (ALIGN) 4 MG CAPS), Coenzyme Q10 (CO Q-10 PO)   Do not take any NSAIDs ie: Ibuprofen, Advil, Naproxen or any medication containing Aspirin.  Do not wear jewelry, make-up or nail polish.  Do not wear lotions, powders, or perfumes. You may NOT wear deodorant.  Do not shave 48 hours prior to surgery.  Do not bring valuables to the hospital.  [facility] is not responsible for any belongings or valuables.  Contacts, dentures or bridgework may not be worn into surgery.  Leave suitcase in the car. After surgery it may be brought to your room.  For patients admitted to the hospital, checkout time is 11:00 AM the day of discharge.   Patients discharged the day of surgery will not be allowed to drive home.  Name and phone number of your driver:   Special Instructions: Shower using CHG 2 nights before surgery and the night before surgery.  If you shower the day of surgery use CHG.  Use special wash - you have one bottle of CHG for all showers.  You should use approximately 1/3 of the bottle for each shower.   Please read over the following fact sheets that you were given: Pain Booklet, Coughing and Deep Breathing, Blood Transfusion Information, MRSA Information and Surgical Site Infection Prevention

## 2013-03-13 MED ORDER — DEXTROSE 5 % IV SOLN
1.5000 g | INTRAVENOUS | Status: AC
  Administered 2013-03-14: 1.5 g via INTRAVENOUS
  Filled 2013-03-13: qty 1.5

## 2013-03-14 ENCOUNTER — Telehealth: Payer: Self-pay | Admitting: Internal Medicine

## 2013-03-14 ENCOUNTER — Encounter (HOSPITAL_COMMUNITY): Payer: Self-pay | Admitting: Anesthesiology

## 2013-03-14 ENCOUNTER — Inpatient Hospital Stay (HOSPITAL_COMMUNITY): Payer: Medicare Other

## 2013-03-14 ENCOUNTER — Inpatient Hospital Stay (HOSPITAL_COMMUNITY)
Admission: RE | Admit: 2013-03-14 | Discharge: 2013-03-16 | DRG: 165 | Disposition: A | Discharge status: Home / Self Care | Payer: Medicare Other | Source: Ambulatory Visit | Attending: Thoracic Surgery (Cardiothoracic Vascular Surgery) | Admitting: Thoracic Surgery (Cardiothoracic Vascular Surgery)

## 2013-03-14 ENCOUNTER — Encounter (HOSPITAL_COMMUNITY): Payer: Self-pay | Admitting: *Deleted

## 2013-03-14 ENCOUNTER — Inpatient Hospital Stay (HOSPITAL_COMMUNITY): Payer: Medicare Other | Admitting: Anesthesiology

## 2013-03-14 ENCOUNTER — Encounter (HOSPITAL_COMMUNITY)
Admission: RE | Disposition: A | Discharge status: Home / Self Care | Payer: Self-pay | Source: Ambulatory Visit | Attending: Thoracic Surgery (Cardiothoracic Vascular Surgery)

## 2013-03-14 ENCOUNTER — Encounter: Payer: Self-pay | Admitting: Internal Medicine

## 2013-03-14 DIAGNOSIS — M810 Age-related osteoporosis without current pathological fracture: Secondary | ICD-10-CM | POA: Diagnosis present

## 2013-03-14 DIAGNOSIS — K219 Gastro-esophageal reflux disease without esophagitis: Secondary | ICD-10-CM | POA: Diagnosis present

## 2013-03-14 DIAGNOSIS — M129 Arthropathy, unspecified: Secondary | ICD-10-CM | POA: Diagnosis present

## 2013-03-14 DIAGNOSIS — E785 Hyperlipidemia, unspecified: Secondary | ICD-10-CM | POA: Diagnosis present

## 2013-03-14 DIAGNOSIS — Z7982 Long term (current) use of aspirin: Secondary | ICD-10-CM

## 2013-03-14 DIAGNOSIS — I2510 Atherosclerotic heart disease of native coronary artery without angina pectoris: Secondary | ICD-10-CM | POA: Diagnosis present

## 2013-03-14 DIAGNOSIS — R918 Other nonspecific abnormal finding of lung field: Secondary | ICD-10-CM

## 2013-03-14 DIAGNOSIS — I739 Peripheral vascular disease, unspecified: Secondary | ICD-10-CM | POA: Diagnosis present

## 2013-03-14 DIAGNOSIS — J8410 Pulmonary fibrosis, unspecified: Principal | ICD-10-CM | POA: Diagnosis present

## 2013-03-14 DIAGNOSIS — D386 Neoplasm of uncertain behavior of respiratory organ, unspecified: Secondary | ICD-10-CM

## 2013-03-14 DIAGNOSIS — R222 Localized swelling, mass and lump, trunk: Secondary | ICD-10-CM | POA: Diagnosis present

## 2013-03-14 DIAGNOSIS — Z8673 Personal history of transient ischemic attack (TIA), and cerebral infarction without residual deficits: Secondary | ICD-10-CM

## 2013-03-14 DIAGNOSIS — E039 Hypothyroidism, unspecified: Secondary | ICD-10-CM | POA: Diagnosis present

## 2013-03-14 DIAGNOSIS — I10 Essential (primary) hypertension: Secondary | ICD-10-CM | POA: Diagnosis present

## 2013-03-14 HISTORY — DX: Other specified postprocedural states: Z98.890

## 2013-03-14 HISTORY — PX: VIDEO ASSISTED THORACOSCOPY (VATS)/WEDGE RESECTION: SHX6174

## 2013-03-14 HISTORY — DX: Nausea with vomiting, unspecified: R11.2

## 2013-03-14 SURGERY — VIDEO ASSISTED THORACOSCOPY (VATS)/WEDGE RESECTION
Anesthesia: General | Site: Chest | Laterality: Right | Wound class: Clean Contaminated

## 2013-03-14 MED ORDER — LOSARTAN POTASSIUM-HCTZ 100-12.5 MG PO TABS: 0.5000 | ORAL_TABLET | Freq: Two times a day (BID) | ORAL | Status: DC

## 2013-03-14 MED ORDER — OXYCODONE HCL 5 MG/5ML PO SOLN: 5.0000 mg | Freq: Once | ORAL | Status: DC | PRN

## 2013-03-14 MED ORDER — MIDAZOLAM HCL 5 MG/5ML IJ SOLN
INTRAMUSCULAR | Status: DC | PRN
  Administered 2013-03-14 (×2): 1 mg via INTRAVENOUS

## 2013-03-14 MED ORDER — SENNOSIDES-DOCUSATE SODIUM 8.6-50 MG PO TABS: 1.0000 | ORAL_TABLET | Freq: Every evening | ORAL | Status: DC | PRN

## 2013-03-14 MED ORDER — GLYCOPYRROLATE 0.2 MG/ML IJ SOLN
INTRAMUSCULAR | Status: DC | PRN
  Administered 2013-03-14: .8 mg via INTRAVENOUS

## 2013-03-14 MED ORDER — ONDANSETRON HCL 4 MG/2ML IJ SOLN: 4.0000 mg | Freq: Four times a day (QID) | INTRAMUSCULAR | Status: DC | PRN

## 2013-03-14 MED ORDER — OXYCODONE-ACETAMINOPHEN 5-325 MG PO TABS
1.0000 | ORAL_TABLET | ORAL | Status: DC | PRN
  Administered 2013-03-15 – 2013-03-16 (×2): 1 via ORAL
  Filled 2013-03-14: qty 2
  Filled 2013-03-14 (×2): qty 1

## 2013-03-14 MED ORDER — SODIUM CHLORIDE 0.9 % IJ SOLN: 9.0000 mL | INTRAMUSCULAR | Status: DC | PRN

## 2013-03-14 MED ORDER — ONDANSETRON HCL 4 MG/2ML IJ SOLN
INTRAMUSCULAR | Status: DC | PRN
  Administered 2013-03-14: 4 mg via INTRAVENOUS

## 2013-03-14 MED ORDER — POTASSIUM CHLORIDE 10 MEQ/50ML IV SOLN
10.0000 meq | Freq: Every day | INTRAVENOUS | Status: DC | PRN
  Administered 2013-03-15 – 2013-03-16 (×4): 10 meq via INTRAVENOUS
  Filled 2013-03-14: qty 50
  Filled 2013-03-14: qty 150

## 2013-03-14 MED ORDER — FENTANYL 10 MCG/ML IV SOLN
INTRAVENOUS | Status: DC
  Administered 2013-03-14: 110 ug via INTRAVENOUS
  Administered 2013-03-14: 40 ug via INTRAVENOUS
  Administered 2013-03-14: 10:00:00 via INTRAVENOUS
  Administered 2013-03-14: 20 ug via INTRAVENOUS
  Administered 2013-03-15: 90 ug via INTRAVENOUS
  Administered 2013-03-15: 50 ug via INTRAVENOUS
  Administered 2013-03-15: 180 ug via INTRAVENOUS
  Administered 2013-03-15: 240 ug via INTRAVENOUS
  Administered 2013-03-15: 01:00:00 via INTRAVENOUS
  Administered 2013-03-15: 100 ug via INTRAVENOUS
  Administered 2013-03-15: 390 ug via INTRAVENOUS
  Administered 2013-03-15: 13:00:00 via INTRAVENOUS
  Filled 2013-03-14 (×3): qty 50

## 2013-03-14 MED ORDER — HYDROCHLOROTHIAZIDE 12.5 MG PO CAPS
12.5000 mg | ORAL_CAPSULE | Freq: Every day | ORAL | Status: DC
  Administered 2013-03-15 – 2013-03-16 (×2): 12.5 mg via ORAL
  Filled 2013-03-14 (×2): qty 1

## 2013-03-14 MED ORDER — PHENYLEPHRINE HCL 10 MG/ML IJ SOLN: INTRAMUSCULAR | Status: DC | PRN

## 2013-03-14 MED ORDER — HYDROMORPHONE HCL PF 1 MG/ML IJ SOLN
INTRAMUSCULAR | Status: AC
  Filled 2013-03-14: qty 1

## 2013-03-14 MED ORDER — DIPHENHYDRAMINE HCL 12.5 MG/5ML PO ELIX
12.5000 mg | ORAL_SOLUTION | Freq: Four times a day (QID) | ORAL | Status: DC | PRN
  Filled 2013-03-14: qty 5

## 2013-03-14 MED ORDER — HYDROMORPHONE HCL PF 1 MG/ML IJ SOLN
0.2500 mg | INTRAMUSCULAR | Status: DC | PRN
  Administered 2013-03-14 (×4): 0.5 mg via INTRAVENOUS

## 2013-03-14 MED ORDER — 0.9 % SODIUM CHLORIDE (POUR BTL) OPTIME
TOPICAL | Status: DC | PRN
  Administered 2013-03-14: 1000 mL

## 2013-03-14 MED ORDER — DIPHENHYDRAMINE HCL 50 MG/ML IJ SOLN: 12.5000 mg | Freq: Four times a day (QID) | INTRAMUSCULAR | Status: DC | PRN

## 2013-03-14 MED ORDER — KCL IN DEXTROSE-NACL 20-5-0.45 MEQ/L-%-% IV SOLN
INTRAVENOUS | Status: AC
  Filled 2013-03-14: qty 1000

## 2013-03-14 MED ORDER — ASPIRIN 81 MG PO TABS
81.0000 mg | ORAL_TABLET | Freq: Every day | ORAL | Status: DC
  Administered 2013-03-15: 81 mg via ORAL
  Filled 2013-03-14 (×3): qty 1

## 2013-03-14 MED ORDER — BISACODYL 5 MG PO TBEC
10.0000 mg | DELAYED_RELEASE_TABLET | Freq: Every day | ORAL | Status: DC
  Administered 2013-03-15 – 2013-03-16 (×2): 10 mg via ORAL
  Filled 2013-03-14 (×2): qty 2

## 2013-03-14 MED ORDER — TRAMADOL HCL 50 MG PO TABS: 50.0000 mg | ORAL_TABLET | Freq: Four times a day (QID) | ORAL | Status: DC | PRN

## 2013-03-14 MED ORDER — NALOXONE HCL 0.4 MG/ML IJ SOLN: 0.4000 mg | INTRAMUSCULAR | Status: DC | PRN

## 2013-03-14 MED ORDER — ROCURONIUM BROMIDE 100 MG/10ML IV SOLN
INTRAVENOUS | Status: DC | PRN
  Administered 2013-03-14: 50 mg via INTRAVENOUS

## 2013-03-14 MED ORDER — PROPOFOL 10 MG/ML IV BOLUS
INTRAVENOUS | Status: DC | PRN
  Administered 2013-03-14: 110 mg via INTRAVENOUS

## 2013-03-14 MED ORDER — OXYCODONE HCL 5 MG PO TABS: 5.0000 mg | ORAL_TABLET | Freq: Once | ORAL | Status: DC | PRN

## 2013-03-14 MED ORDER — FENTANYL CITRATE 0.05 MG/ML IJ SOLN
INTRAMUSCULAR | Status: DC | PRN
  Administered 2013-03-14: 50 ug via INTRAVENOUS
  Administered 2013-03-14: 25 ug via INTRAVENOUS
  Administered 2013-03-14: 50 ug via INTRAVENOUS
  Administered 2013-03-14: 125 ug via INTRAVENOUS

## 2013-03-14 MED ORDER — CLOPIDOGREL BISULFATE 75 MG PO TABS
75.0000 mg | ORAL_TABLET | Freq: Every day | ORAL | Status: DC
  Administered 2013-03-15 – 2013-03-16 (×2): 75 mg via ORAL
  Filled 2013-03-14 (×2): qty 1

## 2013-03-14 MED ORDER — DEXTROSE 5 % IV SOLN
1.5000 g | Freq: Two times a day (BID) | INTRAVENOUS | Status: AC
  Administered 2013-03-14 – 2013-03-15 (×2): 1.5 g via INTRAVENOUS
  Filled 2013-03-14 (×2): qty 1.5

## 2013-03-14 MED ORDER — MIDAZOLAM HCL 5 MG/ML IJ SOLN
INTRAMUSCULAR | Status: DC | PRN
  Administered 2013-03-14 (×2): 1 mg via INTRAVENOUS

## 2013-03-14 MED ORDER — LACTATED RINGERS IV SOLN
INTRAVENOUS | Status: DC | PRN
  Administered 2013-03-14: 07:00:00 via INTRAVENOUS

## 2013-03-14 MED ORDER — OXYCODONE HCL 5 MG PO TABS
5.0000 mg | ORAL_TABLET | ORAL | Status: AC | PRN
  Administered 2013-03-15 (×2): 5 mg via ORAL
  Administered 2013-03-15: 10 mg via ORAL
  Filled 2013-03-14: qty 2
  Filled 2013-03-14 (×2): qty 1

## 2013-03-14 MED ORDER — LEVOTHYROXINE SODIUM 75 MCG PO TABS
75.0000 ug | ORAL_TABLET | Freq: Every day | ORAL | Status: DC
  Administered 2013-03-15 – 2013-03-16 (×2): 75 ug via ORAL
  Filled 2013-03-14 (×3): qty 1

## 2013-03-14 MED ORDER — ISOSORBIDE MONONITRATE ER 30 MG PO TB24
30.0000 mg | ORAL_TABLET | Freq: Every day | ORAL | Status: DC
  Administered 2013-03-15 – 2013-03-16 (×2): 30 mg via ORAL
  Filled 2013-03-14 (×3): qty 1

## 2013-03-14 MED ORDER — ALBUTEROL SULFATE HFA 108 (90 BASE) MCG/ACT IN AERS
4.0000 | INHALATION_SPRAY | Freq: Four times a day (QID) | RESPIRATORY_TRACT | Status: DC
  Filled 2013-03-14: qty 6.7

## 2013-03-14 MED ORDER — LOSARTAN POTASSIUM 50 MG PO TABS
100.0000 mg | ORAL_TABLET | Freq: Every day | ORAL | Status: DC
  Administered 2013-03-15 – 2013-03-16 (×2): 100 mg via ORAL
  Filled 2013-03-14 (×2): qty 2

## 2013-03-14 MED ORDER — KCL IN DEXTROSE-NACL 20-5-0.45 MEQ/L-%-% IV SOLN
INTRAVENOUS | Status: DC
  Administered 2013-03-14 – 2013-03-16 (×5): via INTRAVENOUS
  Filled 2013-03-14 (×6): qty 1000

## 2013-03-14 MED ORDER — NEOSTIGMINE METHYLSULFATE 1 MG/ML IJ SOLN
INTRAMUSCULAR | Status: DC | PRN
  Administered 2013-03-14: 4 mg via INTRAVENOUS

## 2013-03-14 MED ORDER — CARVEDILOL 6.25 MG PO TABS
6.2500 mg | ORAL_TABLET | Freq: Two times a day (BID) | ORAL | Status: DC
  Administered 2013-03-14 – 2013-03-16 (×4): 6.25 mg via ORAL
  Filled 2013-03-14 (×5): qty 1

## 2013-03-14 MED ORDER — SODIUM CHLORIDE 0.9 % IV SOLN
10.0000 mg | INTRAVENOUS | Status: DC | PRN
  Administered 2013-03-14: 5 ug/min via INTRAVENOUS

## 2013-03-14 MED ORDER — HEMOSTATIC AGENTS (NO CHARGE) OPTIME
TOPICAL | Status: DC | PRN
  Administered 2013-03-14: 1 via TOPICAL

## 2013-03-14 MED ORDER — PANTOPRAZOLE SODIUM 40 MG PO TBEC
40.0000 mg | DELAYED_RELEASE_TABLET | Freq: Every day | ORAL | Status: DC
  Administered 2013-03-15 – 2013-03-16 (×2): 40 mg via ORAL
  Filled 2013-03-14 (×2): qty 1

## 2013-03-14 MED ORDER — ATORVASTATIN CALCIUM 40 MG PO TABS
40.0000 mg | ORAL_TABLET | Freq: Every day | ORAL | Status: DC
  Administered 2013-03-14 – 2013-03-15 (×2): 40 mg via ORAL
  Filled 2013-03-14 (×3): qty 1

## 2013-03-14 SURGICAL SUPPLY — 73 items
APL SKNCLS STERI-STRIP NONHPOA (GAUZE/BANDAGES/DRESSINGS) ×2
BAG SPEC RTRVL LRG 6X4 10 (ENDOMECHANICALS)
BENZOIN TINCTURE PRP APPL 2/3 (GAUZE/BANDAGES/DRESSINGS) ×3 IMPLANT
CANISTER SUCTION 2500CC (MISCELLANEOUS) ×6 IMPLANT
CATH KIT ON Q 5IN SLV (PAIN MANAGEMENT) IMPLANT
CATH THORACIC 28FR (CATHETERS) IMPLANT
CATH THORACIC 36FR (CATHETERS) IMPLANT
CATH THORACIC 36FR RT ANG (CATHETERS) IMPLANT
CLIP TI MEDIUM 6 (CLIP) ×3 IMPLANT
CLOTH BEACON ORANGE TIMEOUT ST (SAFETY) ×3 IMPLANT
CONT SPEC 4OZ CLIKSEAL STRL BL (MISCELLANEOUS) ×12 IMPLANT
DRAIN CHANNEL 28F RND 3/8 FF (WOUND CARE) IMPLANT
DRAIN CHANNEL 32F RND 10.7 FF (WOUND CARE) IMPLANT
DRAPE LAPAROSCOPIC ABDOMINAL (DRAPES) ×3 IMPLANT
DRAPE SLUSH/WARMER DISC (DRAPES) ×3 IMPLANT
DRAPE WARM FLUID 44X44 (DRAPE) IMPLANT
ELECT REM PT RETURN 9FT ADLT (ELECTROSURGICAL) ×3
ELECTRODE REM PT RTRN 9FT ADLT (ELECTROSURGICAL) ×2 IMPLANT
GLOVE BIOGEL PI IND STRL 6.5 (GLOVE) ×2 IMPLANT
GLOVE BIOGEL PI INDICATOR 6.5 (GLOVE) ×1
GLOVE SURG SIGNA 7.5 PF LTX (GLOVE) ×6 IMPLANT
GLOVE SURG SS PI 6.5 STRL IVOR (GLOVE) ×6 IMPLANT
GLOVE SURG SS PI 7.0 STRL IVOR (GLOVE) ×3 IMPLANT
GLOVE SURG SS PI 7.5 STRL IVOR (GLOVE) ×3 IMPLANT
GOWN PREVENTION PLUS XLARGE (GOWN DISPOSABLE) ×6 IMPLANT
GOWN STRL NON-REIN LRG LVL3 (GOWN DISPOSABLE) ×6 IMPLANT
HANDLE STAPLE ENDO GIA SHORT (STAPLE) ×1
KIT BASIN OR (CUSTOM PROCEDURE TRAY) ×3 IMPLANT
KIT ROOM TURNOVER OR (KITS) ×3 IMPLANT
KIT SUCTION CATH 14FR (SUCTIONS) ×3 IMPLANT
NS IRRIG 1000ML POUR BTL (IV SOLUTION) ×6 IMPLANT
PACK CHEST (CUSTOM PROCEDURE TRAY) ×3 IMPLANT
PAD ARMBOARD 7.5X6 YLW CONV (MISCELLANEOUS) ×6 IMPLANT
POUCH ENDO CATCH II 15MM (MISCELLANEOUS) IMPLANT
POUCH SPECIMEN RETRIEVAL 10MM (ENDOMECHANICALS) IMPLANT
RELOAD EGIA 60 MED/THCK PURPLE (STAPLE) ×9 IMPLANT
RELOAD ENDO GIA 30 3.5 (STAPLE) ×3 IMPLANT
SEALANT PROGEL (MISCELLANEOUS) ×3 IMPLANT
SEALANT SURG COSEAL 4ML (VASCULAR PRODUCTS) IMPLANT
SEALANT SURG COSEAL 8ML (VASCULAR PRODUCTS) IMPLANT
SOLUTION ANTI FOG 6CC (MISCELLANEOUS) ×3 IMPLANT
SPECIMEN JAR MEDIUM (MISCELLANEOUS) ×3 IMPLANT
SPONGE GAUZE 4X4 12PLY (GAUZE/BANDAGES/DRESSINGS) ×3 IMPLANT
STAPLER ENDO GIA 12MM SHORT (STAPLE) ×2 IMPLANT
SUT PROLENE 4 0 RB 1 (SUTURE)
SUT PROLENE 4-0 RB1 .5 CRCL 36 (SUTURE) IMPLANT
SUT SILK  1 MH (SUTURE) ×2
SUT SILK 1 MH (SUTURE) ×4 IMPLANT
SUT SILK 2 0SH CR/8 30 (SUTURE) ×3 IMPLANT
SUT SILK 3 0SH CR/8 30 (SUTURE) IMPLANT
SUT VIC AB 0 CTX 27 (SUTURE) IMPLANT
SUT VIC AB 1 CTX 27 (SUTURE) ×3 IMPLANT
SUT VIC AB 2-0 CT1 27 (SUTURE) ×3
SUT VIC AB 2-0 CT1 TAPERPNT 27 (SUTURE) ×2 IMPLANT
SUT VIC AB 2-0 CTX 36 (SUTURE) IMPLANT
SUT VIC AB 3-0 MH 27 (SUTURE) IMPLANT
SUT VIC AB 3-0 SH 27 (SUTURE)
SUT VIC AB 3-0 SH 27X BRD (SUTURE) IMPLANT
SUT VIC AB 3-0 X1 27 (SUTURE) ×3 IMPLANT
SUT VICRYL 0 UR6 27IN ABS (SUTURE) ×6 IMPLANT
SUT VICRYL 2 TP 1 (SUTURE) IMPLANT
SWAB COLLECTION DEVICE MRSA (MISCELLANEOUS) IMPLANT
SYSTEM SAHARA CHEST DRAIN ATS (WOUND CARE) ×3 IMPLANT
TAPE CLOTH SURG 4X10 WHT LF (GAUZE/BANDAGES/DRESSINGS) ×3 IMPLANT
TIP APPLICATOR SPRAY EXTEND 16 (VASCULAR PRODUCTS) ×3 IMPLANT
TOWEL OR 17X24 6PK STRL BLUE (TOWEL DISPOSABLE) ×3 IMPLANT
TOWEL OR 17X26 10 PK STRL BLUE (TOWEL DISPOSABLE) ×6 IMPLANT
TRAP SPECIMEN MUCOUS 40CC (MISCELLANEOUS) IMPLANT
TRAY FOLEY BAG SILVER LF 16FR (CATHETERS) ×3 IMPLANT
TRAY FOLEY CATH 14FRSI W/METER (CATHETERS) ×3 IMPLANT
TUBE ANAEROBIC SPECIMEN COL (MISCELLANEOUS) IMPLANT
TUNNELER SHEATH ON-Q 16GX12 DP (PAIN MANAGEMENT) IMPLANT
WATER STERILE IRR 1000ML POUR (IV SOLUTION) ×6 IMPLANT

## 2013-03-14 NOTE — Progress Notes (Signed)
Utilization review completed.  

## 2013-03-14 NOTE — Brief Op Note (Addendum)
03/14/2013  9:26 AM  PATIENT:  [name]  79 y.o. female  PRE-OPERATIVE DIAGNOSIS:  RIGHT LUNG MASS  POST-OPERATIVE DIAGNOSIS:  Necrotizing inflammatory right upper lobe mass  PROCEDURE:   RIGHT VIDEO ASSISTED THORACOSCOPY RIGHT UPPER LOBE WEDGE RESECTION, LYMPH NODE DISSECTION  SURGEON:  Loreli Slot, MD  ASSISTANT: Coral Ceo, PA-C  ANESTHESIA:   general  SPECIMEN:  Source of Specimen:  Right upper lobe wedge  DISPOSITION OF SPECIMEN:  Pathology  DRAINS: 28 Fr CT  PATIENT CONDITION:  PACU - hemodynamically stable.   FROZEN showed granulomatous inflammation, no tumor seen, specimen sent for AFB and fungal cultures

## 2013-03-14 NOTE — Interval H&P Note (Signed)
History and Physical Interval Note:  03/14/2013 7:33 AM  Kathleen Thompson  has presented today for surgery, with the diagnosis of RIGHT LUNG MASS  The various methods of treatment have been discussed with the patient and family. After consideration of risks, benefits and other options for treatment, the patient has consented to  Procedure(s) with comments: VIDEO ASSISTED THORACOSCOPY (VATS)/WEDGE RESECTION (Right) LOBECTOMY (Right) - POSSIBLE RUL LOBECTOMY as a surgical intervention .  The patient's history has been reviewed, patient examined, no change in status, stable for surgery.  I have reviewed the patient's chart and labs.  Questions were answered to the patient's satisfaction.     Dorann Davidson C

## 2013-03-14 NOTE — Telephone Encounter (Signed)
Results are in EPIC and will forward to MW. Please advise thanks

## 2013-03-14 NOTE — Transfer of Care (Signed)
Immediate Anesthesia Transfer of Care Note  Patient: Kathleen Thompson  Procedure(s) Performed: Procedure(s): VIDEO ASSISTED THORACOSCOPY (VATS)/WEDGE RESECTION (Right)  Patient Location: PACU  Anesthesia Type:General  Level of Consciousness: sedated  Airway & Oxygen Therapy: Patient Spontanous Breathing and Patient connected to face mask oxygen  Post-op Assessment: Report given to PACU RN and Post -op Vital signs reviewed and stable  Post vital signs: Reviewed and stable  Complications: No apparent anesthesia complications

## 2013-03-14 NOTE — Anesthesia Postprocedure Evaluation (Signed)
  Anesthesia Post-op Note  Patient: Kathleen Thompson  Procedure(s) Performed: Procedure(s): VIDEO ASSISTED THORACOSCOPY (VATS)/WEDGE RESECTION (Right)  Patient Location: PACU  Anesthesia Type:General  Level of Consciousness: awake and alert   Airway and Oxygen Therapy: Patient Spontanous Breathing and Patient connected to nasal cannula oxygen  Post-op Pain: moderate  Post-op Assessment: Post-op Vital signs reviewed, Patient's Cardiovascular Status Stable, Respiratory Function Stable, Patent Airway and No signs of Nausea or vomiting  Post-op Vital Signs: Reviewed and stable  Complications: No apparent anesthesia complications

## 2013-03-14 NOTE — Preoperative (Signed)
Beta Blockers   Reason not to administer Beta Blockers:Not Applicable 

## 2013-03-14 NOTE — H&P (View-Only) (Signed)
PCP is Carrie Mew, MD Referring Provider is Stacie Glaze, MD, Sandrea Hughs, MD  No chief complaint on file.   HPI: 79 yo woman with a cc/o a ling nodule.  Mrs. Deblois is a 79 yo life-long nonsmoker. She presented in May with c/o severe chest tightness radiating through to her back and accompanied by wheezing. She was evaluated with cardiac catheterization which showed single vessel CAD involving a small RCA and was not felt to be the cause of her pain. She also had a CXR which showed a right lung nodule. A CT showed an 8 x 11 mm RUL nodule. She was seen by Dr. Sherene Sires and followed up with an interval PET/ CT which showed an increase in size of the mass, which was also hypermetabolic.  Of note, she was diagnosed with reflux back in May as the cause of her CP. She has not had any CP or SOB since being treated with protonix. She has never smoked. She is retired but remains active.  ZUBROD/ ECOG score= 0     Past Medical History  Diagnosis Date  . Internal hemorrhoid   . Diverticulosis   . Gastroparesis   . GERD (gastroesophageal reflux disease)   . Adenomatous colon polyp 02/2003  . Cellulitis and abscess of hand   . Hyperlipidemia   . PVD (peripheral vascular disease)   . Hypothyroidism   . Hypertension   . Iron deficiency anemia   . Esophageal stricture   . Arthritis   . Blood transfusion   . Cataract   . Osteoporosis   . TIA (transient ischemic attack)     Past Surgical History  Procedure Laterality Date  . Appendectomy    . Abdominal hysterectomy    . Cervical fusion    . Shoulder surgery       Family History  Problem Relation Age of Onset  . Colon cancer Brother     at age 20  . Colon cancer Sister     at age 83  . Diabetes Mother   . Diabetes Maternal Aunt   . Diabetes Maternal Uncle   . Heart attack Mother   . Heart attack Father   . Heart attack Sister   . Heart attack Brother   . Heart attack Brother   . Heart attack Brother   . Heart attack  Brother   . Heart attack Sister   . Asthma Father     Social History History  Substance Use Topics  . Smoking status: Never Smoker   . Smokeless tobacco: Never Used  . Alcohol Use: No    Current Outpatient Prescriptions  Medication Sig Dispense Refill  . aspirin 81 MG tablet Take 81 mg by mouth daily.      Marland Kitchen atorvastatin (LIPITOR) 40 MG tablet Take 1 tablet (40 mg total) by mouth daily.  90 tablet  3  . calcium carbonate 200 MG capsule Take 250 mg by mouth 2 (two) times daily with a meal.        . carvedilol (COREG) 6.25 MG tablet Take 1 tablet (6.25 mg total) by mouth 2 (two) times daily.  180 tablet  3  . clopidogrel (PLAVIX) 75 MG tablet Take 1 tablet (75 mg total) by mouth daily.  90 tablet  3  . co-enzyme Q-10 30 MG capsule Take 10 mg by mouth daily.       . furosemide (LASIX) 20 MG tablet Take 20 mg by mouth daily as needed.      Marland Kitchen  isosorbide mononitrate (IMDUR) 30 MG 24 hr tablet Take 1 tablet (30 mg total) by mouth daily.  30 tablet  3  . levothyroxine (SYNTHROID, LEVOTHROID) 75 MCG tablet Take 1 tablet (75 mcg total) by mouth daily.  90 tablet  3  . losartan-hydrochlorothiazide (HYZAAR) 100-12.5 MG per tablet 1/2 tab twice daily      . magnesium 30 MG tablet Take 30 mg by mouth daily.        . pantoprazole (PROTONIX) 40 MG tablet Take 1 tablet (40 mg total) by mouth daily. Take 30-60 min before first meal of the day  90 tablet  3  . Probiotic Product (ALIGN) 4 MG CAPS Take 1 capsule by mouth daily.  30 capsule  0  . traMADol (ULTRAM) 50 MG tablet Take 1 tablet (50 mg total) by mouth every 6 (six) hours as needed for pain.  180 tablet  2  . vitamin D, CHOLECALCIFEROL, 400 UNITS tablet Take 400 Units by mouth daily.         No current facility-administered medications for this visit.    Allergies  Allergen Reactions  . Rofecoxib     hives  . Latex Rash    Pt states that latex causes blisters to skin    Review of Systems  Constitutional: Negative for fever, chills and  unexpected weight change.  Respiratory: Positive for cough, shortness of breath and wheezing.   Cardiovascular: Positive for chest pain.  Gastrointestinal:       Severe reflux- controlled with protonix  Genitourinary: Negative.   Neurological:       History of ministrokes- no recent strokes  Hematological: Bruises/bleeds easily (on plavix).  All other systems reviewed and are negative.    There were no vitals taken for this visit. Physical Exam  Vitals reviewed. Constitutional: She is oriented to person, place, and time. She appears well-developed and well-nourished. No distress.  HENT:  Head: Normocephalic and atraumatic.  Eyes: EOM are normal. Pupils are equal, round, and reactive to light.  Neck: Neck supple. No thyromegaly present.  Cardiovascular: Normal rate, regular rhythm and intact distal pulses.  Exam reveals no gallop and no friction rub.   No murmur heard. Pulmonary/Chest: Effort normal and breath sounds normal. She has no wheezes. She has no rales.  Abdominal: Soft. There is tenderness.  Musculoskeletal: She exhibits no edema.  Lymphadenopathy:    She has no cervical adenopathy.  Neurological: She is alert and oriented to person, place, and time. No cranial nerve deficit.  Skin: Skin is warm and dry.     Diagnostic Tests:  CT Chest 12/19/12  *RADIOLOGY REPORT*  Clinical Data: Possible right upper lobe lung nodule by chest x-  ray.  CT CHEST WITH CONTRAST  Technique: Multidetector CT imaging of the chest was performed  following the standard protocol during bolus administration of  intravenous contrast.  Contrast: 80mL OMNIPAQUE IOHEXOL 300 MG/ML SOLN  Comparison: Two-view chest x-ray earlier today.  Findings: Corresponding to the radiographic abnormality is a  discrete solid pulmonary nodule in the posterolateral right upper  lobe abutting the major fissure. This nodule is not calcified and  measures approximately 8 x 11 mm. There is some adjacent mild   parenchymal nodularity extending posteriorly.  No associated lymphadenopathy is identified in the chest. There is  no evidence of pleural or pericardial fluid. The heart size is  normal.  Incidental note is made of mild dilatation of the ascending  thoracic aorta beginning just above the sinotubular junction.  Although diameter of 3.8 - 3.9 cm is not overtly aneurysmal, the  ascending thoracic aorta is nearly twice the caliber of the  descending thoracic aorta which measures 2.0 cm. Correlation is  suggested electively with any evidence of aortic valvular disease.  By CT, there is a mild amount of calcification associated with the  posterior aspect of the aortic valve.  The visualized upper abdomen shows no evidence of abnormal masses.  No bony lesions are identified in the chest.  IMPRESSION:  1. 8 x 11 mm solid right upper lobe pulmonary nodule without  associated visible lymphadenopathy. Appropriate workup would  include a PET scan to determine if this is a hypermetabolic nodule.  2. Prominent caliber of the ascending thoracic aorta measuring up  to 3.9 cm in greatest diameter. Recommend elective correlation  with the possible presence of underlying aortic valvular disease.  Original Report Authenticated By: Irish Lack, M.D.   PET/Ct 02/17/13 *RADIOLOGY REPORT*  Clinical Data: Recent imaging demonstrates a solitary pulmonary  nodule. FDG PET CT requested to evaluate for possible malignancy.  NUCLEAR MEDICINE PET SKULL BASE TO THIGH  Fasting Blood Glucose: 126  Technique: Is 20.4 mCi F-18 FDG was injected intravenously. CT  data was obtained and used for attenuation correction and anatomic  localization only. (This was not acquired as a diagnostic CT  examination.) Additional exam technical data entered on  technologist worksheet.  Comparison: Chest CT on 12/19/2012  Findings:  Head/Neck: No hypermetabolic lymph nodes in the neck.  Chest: No hypermetabolic mediastinal or  hilar nodes. Poorly  defined 1.5 cm nodular density in the lateral right upper lobe  shows hypermetabolic activity, with maximum SUV of 3.6. This is  suspicious for primary bronchogenic carcinoma. No other suspicious  pulmonary nodules masses are identified.  Abdomen/Pelvis: No abnormal hypermetabolic activity within the  liver, pancreas, adrenal glands, or spleen. No hypermetabolic  lymph nodes in the abdomen or pelvis.  Skeleton: No focal hypermetabolic activity to suggest skeletal  metastasis.  IMPRESSION:  1. Mildly hypermetabolic 1.5 cm right upper lobe pulmonary nodule,  suspicious for primary bronchogenic carcinoma.  2. No evidence of nodal or distant metastatic disease.  Original Report Authenticated By: Myles Rosenthal, M.D.  Impression: 79 yo nonsmoker incidentally found to have a right upper lobe nodule during a workup for CP this Spring. On follow up the mass has grown and is hypermetabolic arousing suspicion that it is a lung cancer.   I reviewed the films with Mr. And Mrs. Eulah Pont. We discussed the differential diagnosis and possible therapeutic approaches. In my opinion this needs to be considered a lung cancer unless it can be proven otherwise. The only way to definitively prove it is not cancerous is with an excisional biopsy.  They understand that needle or bronchoscopic biopsies have a significant false negative rate and cannot be relied upon as definitive proof.  I recommended that she undergo a Right VATS, wedge resection, and possible right upper lobectomy(if frozen + for cancer). I discussed with them the indications, risks, benefits and alternatives. They understand the risks include, but are not limited to, death, MI, DVT, PE, bleeding, possible need for transfusion, infection, prolonged air leak, as well as other unforeseeable complications.  She accepts the risks and agrees to proceed.   She will need PFTs but I don't expect them to be an issue  Plan: Right VATS,  wedge resection, possible lobectomy on Monday 03/14/13

## 2013-03-14 NOTE — Anesthesia Procedure Notes (Signed)
Procedure Name: Intubation Date/Time: 03/14/2013 8:55 AM Performed by: Armandina Gemma Pre-anesthesia Checklist: Patient identified, Timeout performed, Suction available, Patient being monitored and Emergency Drugs available Patient Re-evaluated:Patient Re-evaluated prior to inductionOxygen Delivery Method: Circle system utilized Preoxygenation: Pre-oxygenation with 100% oxygen Intubation Type: IV induction Ventilation: Mask ventilation without difficulty Laryngoscope Size: Miller and 2 Grade View: Grade I Endobronchial tube: Double lumen EBT and Left and 37 Fr Number of attempts: 1 Airway Equipment and Method: Stylet Placement Confirmation: ETT inserted through vocal cords under direct vision,  breath sounds checked- equal and bilateral and positive ETCO2 Secured at: 29 cm Tube secured with: Tape Dental Injury: Teeth and Oropharynx as per pre-operative assessment  Comments: IV induction Fitzgerald- intubation AM CRNA atraumatic

## 2013-03-14 NOTE — Anesthesia Preprocedure Evaluation (Addendum)
Anesthesia Evaluation  Patient identified by MRN, date of birth, ID band Patient awake    Reviewed: Allergy & Precautions, H&P , NPO status , Patient's Chart, lab work & pertinent test results  History of Anesthesia Complications (+) PONV  Airway Mallampati: III TM Distance: >3 FB Neck ROM: Full    Dental no notable dental hx. (+) Teeth Intact and Dental Advisory Given   Pulmonary shortness of breath, asthma ,  breath sounds clear to auscultation  Pulmonary exam normal       Cardiovascular hypertension, On Medications and On Home Beta Blockers + Peripheral Vascular Disease Rhythm:Regular Rate:Normal     Neuro/Psych  Headaches, TIAnegative psych ROS   GI/Hepatic Neg liver ROS, GERD-  Medicated and Controlled,  Endo/Other  Hypothyroidism   Renal/GU negative Renal ROS  negative genitourinary   Musculoskeletal   Abdominal   Peds  Hematology negative hematology ROS (+)   Anesthesia Other Findings   Reproductive/Obstetrics negative OB ROS                          Anesthesia Physical Anesthesia Plan  ASA: III  Anesthesia Plan: General   Post-op Pain Management:    Induction: Intravenous  Airway Management Planned: Double Lumen EBT  Additional Equipment: Arterial line, CVP and Ultrasound Guidance Line Placement  Intra-op Plan:   Post-operative Plan: Extubation in OR and Possible Post-op intubation/ventilation  Informed Consent: I have reviewed the patients History and Physical, chart, labs and discussed the procedure including the risks, benefits and alternatives for the proposed anesthesia with the patient or authorized representative who has indicated his/her understanding and acceptance.   Dental advisory given  Plan Discussed with: CRNA  Anesthesia Plan Comments:         Anesthesia Quick Evaluation

## 2013-03-15 ENCOUNTER — Inpatient Hospital Stay (HOSPITAL_COMMUNITY): Payer: Medicare Other

## 2013-03-15 ENCOUNTER — Encounter (HOSPITAL_COMMUNITY): Payer: Self-pay | Admitting: Thoracic Surgery (Cardiothoracic Vascular Surgery)

## 2013-03-15 LAB — CBC
HCT: 36.2 % (ref 36.0–46.0)
MCHC: 35.1 g/dL (ref 30.0–36.0)
RDW: 12.6 % (ref 11.5–15.5)

## 2013-03-15 LAB — BLOOD GAS, ARTERIAL
Bicarbonate: 25.3 mEq/L — ABNORMAL HIGH (ref 20.0–24.0)
O2 Saturation: 99 %
Patient temperature: 98.6

## 2013-03-15 LAB — BASIC METABOLIC PANEL
BUN: 4 mg/dL — ABNORMAL LOW (ref 6–23)
CO2: 25 mEq/L (ref 19–32)
Chloride: 99 mEq/L (ref 96–112)
Creatinine, Ser: 0.71 mg/dL (ref 0.50–1.10)
GFR calc Af Amer: >90 mL/min (ref >90)
Glucose, Bld: 144 mg/dL — ABNORMAL HIGH (ref 70–99)

## 2013-03-15 MED ORDER — ENOXAPARIN SODIUM 30 MG/0.3ML ~~LOC~~ SOLN
40.0000 mg | SUBCUTANEOUS | Status: DC
  Filled 2013-03-15: qty 0.4

## 2013-03-15 MED ORDER — ENOXAPARIN SODIUM 40 MG/0.4ML ~~LOC~~ SOLN
40.0000 mg | SUBCUTANEOUS | Status: DC
  Administered 2013-03-15 – 2013-03-16 (×2): 40 mg via SUBCUTANEOUS
  Filled 2013-03-15 (×2): qty 0.4

## 2013-03-15 MED ORDER — SODIUM CHLORIDE 0.9 % IJ SOLN
INTRAMUSCULAR | Status: AC
  Filled 2013-03-15: qty 10

## 2013-03-15 MED ORDER — SODIUM CHLORIDE 0.9 % IJ SOLN
INTRAMUSCULAR | Status: AC
  Administered 2013-03-15: 9 mL via INTRAVENOUS
  Filled 2013-03-15: qty 10

## 2013-03-15 MED ORDER — POTASSIUM CHLORIDE 10 MEQ/50ML IV SOLN
10.0000 meq | INTRAVENOUS | Status: AC
  Administered 2013-03-15 (×3): 10 meq via INTRAVENOUS
  Filled 2013-03-15 (×3): qty 50

## 2013-03-15 NOTE — Op Note (Signed)
NAMECHRISTYANA, Kathleen Thompson              ACCOUNT NO.:  [id number]  MEDICAL RECORD NO.:  [id number]  LOCATION:  3311                         FACILITY:  [facility]  PHYSICIAN:  Salvatore Decent. Dorris Fetch, M.D.DATE OF BIRTH:  1934/02/18  DATE OF PROCEDURE:  03/14/2013 DATE OF DISCHARGE:                              OPERATIVE REPORT   PREOPERATIVE DIAGNOSIS:  Right upper lobe mass.  POSTOPERATIVE DIAGNOSIS:  Granuloma, right upper lobe.  PROCEDURES: 1. Right video-assisted thoracoscopy. 2. Wedge resection of right upper lobe nodule.  SURGEON:  Salvatore Decent. Dorris Fetch, MD  ASSISTANT:  Coral Ceo, PA  ANESTHESIA:  General.  FINDINGS:  A 1.5-cm mass along the inferior lateral aspect of the right upper lobe.  Wedge resection performed.  Frozen section revealed granulomatous inflammation with clear margins.  No tumor seen.  CLINICAL NOTE:  Kathleen Thompson is a 79 year old nonsmoker who was found in May to have a right upper lobe lung nodule during workup of chest pain. She ultimately was found to have gastroesophageal reflux.  A PET-CT was done to follow up the nodule. It had increased in size.  The mass also was noted to be hypermetabolic.  She was advised to have the mass excised for definitive diagnosis and possible lobectomy should it turn out to be cancerous.  The indications, risks, benefits, and alternatives were discussed in detail with the patient. She understood and accepted the risks and agreed to proceed.  OPERATIVE NOTE:  Kathleen Thompson was brought to the preoperative holding area on March 14, 2013, there Anesthesia placed a central line and an arterial blood pressure monitoring line.  Intravenous antibiotics were administered.  She was taken to the operating room, anesthetized, and intubated with a double-lumen endotracheal tube.  A Foley catheter was placed.  Sequential compression stockings were placed on the legs for DVT prophylaxis.  She was placed in a left lateral decubitus  position and the right chest was prepped and draped in usual sterile fashion. Single lung ventilation of the left lung was carried out and was tolerated well throughout the procedure.  An incision was made in the seventh intercostal space in the anterior axillary line.  The chest was entered bluntly using a hemostat.  A port was inserted and the thoracoscope was placed into the chest.  There was no cross ventilation of the right lung.  A small utility incision was made in approximately the fourth intercostal space anterolaterally, this was approximately 4 to 5 cm in length.  The right upper lobe was grasped.  The mass was immediately apparent on the inferolateral edge of the right upper lobe near the confluence of the major and minor fissures.  The mass was subpleural and easily palpable. There was some alteration of the visceral pleura overlying the mass, but was not clear if there was any invasion.  The mass was grasped and removed with a wedge resection with sequential firings of an endoscopic GIA type stapler using 60-mm purple cartridges.  The lesion then was removed and sent for frozen section.  While awaiting for frozen section, the inferior pulmonary ligament was divided.  No lymph node was encountered. The pleural reflection was divided.  Posteriorly, a subcarinal node was taken as  a separate specimen.  This appeared benign.  No further dissection was done while awaiting the results of the frozen section, which ultimately returned with granulomatous disease, no cancer was seen.  The right lung was reinflated.  ProGEL was applied to the staple line.  A 28-French chest tube was placed through the original port incision and directed to the apex.  The utility incision was closed with #1 Vicryl fascial sutures, 2-0 Vicryl subcutaneous suture, and a 3-0 Vicryl subcuticular suture.  All sponge, needle, and instrument counts were correct at the end of the procedure.  The patient was  taken from the operating room to the postanesthetic care unit in good condition.     Salvatore Decent Dorris Fetch, M.D.     SCH/MEDQ  D:  03/14/2013  T:  03/15/2013  Job:  [id number]

## 2013-03-15 NOTE — Progress Notes (Signed)
Witnessed fentanyl from PCA syringe WIS with Animator

## 2013-03-15 NOTE — Progress Notes (Signed)
1 Day Post-Op Procedure(s) (LRB): VIDEO ASSISTED THORACOSCOPY (VATS)/WEDGE RESECTION (Right) Subjective: Some incisional pain No nausea but not much of an appetite  Objective: Vital signs in last 24 hours: Temp:  [95.5 F (35.3 C)-98.9 F (37.2 C)] 98.9 F (37.2 C) (08/05 0448) Pulse Rate:  [60-82] 77 (08/05 0448) Cardiac Rhythm:  [-] Normal sinus rhythm (08/05 0800) Resp:  [11-22] 19 (08/05 0448) BP: (116-142)/(50-75) 125/55 mmHg (08/05 0448) SpO2:  [96 %-100 %] 97 % (08/05 0448) Arterial Line BP: (132-170)/(60-102) 168/97 mmHg (08/04 1900) Weight:  [116 lb 10 oz (52.9 kg)] 116 lb 10 oz (52.9 kg) (08/04 1155)  Hemodynamic parameters for last 24 hours:    Intake/Output from previous day: 08/04 0701 - 08/05 0700 In: 3800 [I.V.:3750; IV Piggyback:50] Out: 3575 [Urine:3325; Blood:50; Chest Tube:200] Intake/Output this shift: Total I/O In: 150 [I.V.:100; IV Piggyback:50] Out: 50 [Chest Tube:50]  General appearance: alert and no distress Heart: regular rate and rhythm Lungs: diminished breath sounds bibasilar Abdomen: normal findings: soft, non-tender no air leak  Lab Results:  Recent Labs  03/15/13 0400  WBC 8.6  HGB 12.7  HCT 36.2  PLT 175   BMET:  Recent Labs  03/15/13 0400  NA 131*  K 3.7  CL 99  CO2 25  GLUCOSE 144*  BUN 4*  CREATININE 0.71  CALCIUM 8.7    PT/INR: No results found for this basename: LABPROT, INR,  in the last 72 hours ABG    Component Value Date/Time   PHART 7.403 03/15/2013 0453   HCO3 25.3* 03/15/2013 0453   TCO2 26.6 03/15/2013 0453   ACIDBASEDEF 0.8 03/10/2013 1030   O2SAT 99.0 03/15/2013 0453   CBG (last 3)  No results found for this basename: GLUCAP,  in the last 72 hours  Assessment/Plan: S/P Procedure(s) (LRB): VIDEO ASSISTED THORACOSCOPY (VATS)/WEDGE RESECTION (Right) POD # 1 R VATS, wedge  No air leak- CT to water seal, recheck CXR at noon and dc tube if OK  Decrease IV to 50 ml/hr- KVO when tolerating PO  Restart  plavix  Has SCD will add lovenox for DVT prophylaxis  DC a line  Keep foley another 24 hours   LOS: 1 day    HENDRICKSON,STEVEN C 03/15/2013

## 2013-03-15 NOTE — Telephone Encounter (Signed)
Dr. Sherene Sires can we close this message? Carron Curie, CMA

## 2013-03-16 ENCOUNTER — Inpatient Hospital Stay (HOSPITAL_COMMUNITY): Payer: Medicare Other

## 2013-03-16 LAB — COMPREHENSIVE METABOLIC PANEL
ALT: 10 U/L (ref 0–35)
AST: 18 U/L (ref 0–37)
Alkaline Phosphatase: 61 U/L (ref 39–117)
CO2: 28 mEq/L (ref 19–32)
Chloride: 100 mEq/L (ref 96–112)
GFR calc Af Amer: >90 mL/min (ref >90)
GFR calc non Af Amer: 81 mL/min — ABNORMAL LOW (ref >90)
Glucose, Bld: 117 mg/dL — ABNORMAL HIGH (ref 70–99)
Potassium: 3.6 mEq/L (ref 3.5–5.1)
Sodium: 135 mEq/L (ref 135–145)
Total Bilirubin: 1 mg/dL (ref 0.3–1.2)

## 2013-03-16 LAB — CBC
Hemoglobin: 11.6 g/dL — ABNORMAL LOW (ref 12.0–15.0)
MCH: 31.8 pg (ref 26.0–34.0)
RBC: 3.65 MIL/uL — ABNORMAL LOW (ref 3.87–5.11)
WBC: 7.2 10*3/uL (ref 4.0–10.5)

## 2013-03-16 MED ORDER — ALBUTEROL SULFATE HFA 108 (90 BASE) MCG/ACT IN AERS
2.0000 | INHALATION_SPRAY | Freq: Four times a day (QID) | RESPIRATORY_TRACT | Status: DC | PRN
  Filled 2013-03-16: qty 6.7

## 2013-03-16 MED ORDER — ASPIRIN EC 81 MG PO TBEC
81.0000 mg | DELAYED_RELEASE_TABLET | Freq: Every day | ORAL | Status: DC
  Administered 2013-03-16: 81 mg via ORAL
  Filled 2013-03-16: qty 1

## 2013-03-16 MED ORDER — OXYCODONE-ACETAMINOPHEN 5-325 MG PO TABS
1.0000 | ORAL_TABLET | Freq: Four times a day (QID) | ORAL | Status: DC | PRN
Start: 2013-03-16 — End: 2013-04-04

## 2013-03-16 NOTE — Progress Notes (Addendum)
[facility] PROGRESS NOTE                   [address]            [place] [address]          [phone]   2 Days Post-Op Procedure(s) (LRB): VIDEO ASSISTED THORACOSCOPY (VATS)/WEDGE RESECTION (Right)  Total Length of Stay:  LOS: 2 days   Subjective: Feels ok, ambulating in unit, some mild SOB at times, mild pain  Objective: Vital signs in last 24 hours: Temp:  [98.3 F (36.8 C)-99.4 F (37.4 C)] 98.3 F (36.8 C) (08/06 0405) Pulse Rate:  [78-94] 80 (08/06 0405) Cardiac Rhythm:  [-] Normal sinus rhythm (08/05 1959) Resp:  [13-20] 14 (08/06 0405) BP: (90-126)/(41-60) 90/41 mmHg (08/06 0405) SpO2:  [91 %-100 %] 93 % (08/06 0405) Arterial Line BP: (134)/(70) 134/70 mmHg (08/05 0800)  Filed Weights   03/14/13 1155  Weight: 116 lb 10 oz (52.9 kg)    Weight change:    Hemodynamic parameters for last 24 hours:    Intake/Output from previous day: 08/05 0701 - 08/06 0700 In: 1275.8 [I.V.:1225.8; IV Piggyback:50] Out: 4350 [Urine:4250; Chest Tube:100]  Intake/Output this shift:    Current Meds: Scheduled Meds: . albuterol  4 puff Inhalation Q6H  . aspirin  81 mg Oral Daily  . atorvastatin  40 mg Oral QPC supper  . bisacodyl  10 mg Oral Daily  . carvedilol  6.25 mg Oral BID  . clopidogrel  75 mg Oral Daily  . enoxaparin (LOVENOX) injection  40 mg Subcutaneous Q24H  . fentaNYL   Intravenous Q4H  . hydrochlorothiazide  12.5 mg Oral Daily  . isosorbide mononitrate  30 mg Oral Daily  . levothyroxine  75 mcg Oral QAC breakfast  . losartan  100 mg Oral Daily  . pantoprazole  40 mg Oral Daily   Continuous Infusions: . dextrose 5 % and 0.45 % NaCl with KCl 20 mEq/L 50 mL/hr at 03/16/13 0404   PRN Meds:.diphenhydrAMINE, diphenhydrAMINE, naloxone, ondansetron (ZOFRAN) IV, oxyCODONE-acetaminophen, potassium chloride, senna-docusate, sodium chloride, traMADol  General appearance: alert, cooperative and no distress Heart: regular rate and  rhythm Lungs: min dim in bases Abdomen: soft, nontender, non distender Extremities: no edema Wound: incis healing well  Lab Results: CBC: Recent Labs  03/15/13 0400 03/16/13 0415  WBC 8.6 7.2  HGB 12.7 11.6*  HCT 36.2 33.1*  PLT 175 151   BMET:  Recent Labs  03/15/13 0400 03/16/13 0415  NA 131* 135  K 3.7 3.6  CL 99 100  CO2 25 28  GLUCOSE 144* 117*  BUN 4* 4*  CREATININE 0.71 0.72  CALCIUM 8.7 8.6    PT/INR: No results found for this basename: LABPROT, INR,  in the last 72 hours Radiology: Dg Chest Port 1 View  03/15/2013   *RADIOLOGY REPORT*  Clinical Data: Status post chest tube removal.  PORTABLE CHEST - 1 VIEW  Comparison: One-view chest of the same day.  Findings: Heart size is normal.  Right IJ line stable.  The right- sided chest tube has been removed.  There is no pneumothorax. Postop changes of the right upper lobe are again noted. Atelectasis and effusion in the left base are improving.  IMPRESSION:  1.  Status post removal of right-sided chest tube without pneumothorax. 2.  Postoperative changes in the right upper lobe. 3.  Decreasing left pleural effusion and atelectasis.   Original Report Authenticated By: Marin Roberts, M.D.  Dg Chest Port 1 View  03/15/2013   *RADIOLOGY REPORT*  Clinical Data: Chest tube to water seal  PORTABLE CHEST - 1 VIEW  Comparison: 03/15/2013 at 5:44 a.m.  Findings: There is no pneumothorax.  The positions of the right chest tube and right internal jugular central venous line are stable.  Persistent left lung base opacity is unchanged.  IMPRESSION: No pneumothorax following placement of the right chest tube on water seal.   Original Report Authenticated By: Amie Portland, M.D.   Dg Chest Port 1 View  03/15/2013   *RADIOLOGY REPORT*  Clinical Data: Chest tube evaluation.  PORTABLE CHEST - 1 VIEW  Comparison: 03/14/2013.  Findings: Post right lung surgery with surgical staples in place. Right-sided chest tube in place without evidence  gross pneumothorax.  Right central line tip caval atrial junction level and unchanged.  Cardiomegaly.  Central pulmonary vascular prominence.  Left base atelectasis or small left-sided pleural effusion may be present.  Calcified mildly tortuous aorta.  IMPRESSION: Post right lung surgery with surgical staples in place.  Right- sided chest tube in place without evidence gross pneumothorax.  Right central line tip caval atrial junction level and unchanged.  Cardiomegaly.  Central pulmonary vascular prominence.  Left base atelectasis or small left-sided pleural effusion may be present.   Original Report Authenticated By: Lacy Duverney, M.D.   Dg Chest Portable 1 View  03/14/2013   *RADIOLOGY REPORT*  Clinical Data: History of wedge resection of right upper lobe lesion, follow-up  PORTABLE CHEST - 1 VIEW  Comparison: Chest x-ray of 03/14/2013, CT chest of 12/19/2012 and PET CT of 02/17/2013.  Findings: The right upper lobe lesion has been resected with volume loss and bibasilar atelectasis present.  There is opacity in the right upper lung field at the site of resection, and a right chest tube is noted.  A right IJ central venous line tip is noted overlying the expected SVC - RA junction.  Heart size is stable.  IMPRESSION:  1.  Postop resection of right upper lobe lesion with postop changes in the right upper hemithorax. 2.  Diminished aeration with bibasilar atelectasis. 3.  Right IJ central venous line tip overlies the expected SVC - RA junction.   Original Report Authenticated By: Dwyane Dee, M.D.     Assessment/Plan: S/P Procedure(s) (LRB): VIDEO ASSISTED THORACOSCOPY (VATS)/WEDGE RESECTION (Right)  1 Doing well, d/c pca, central line 2 If she does well this am with no new issues poss d/c later today 3 labs stable    GOLD,WAYNE E 03/16/2013 7:31 AM  Patient seen and examined, agree with above Home later today

## 2013-03-16 NOTE — Progress Notes (Signed)
Discharge instructions provided, verbalized understanding, discharged home with husband. .me

## 2013-03-16 NOTE — Progress Notes (Addendum)
Waste 43 mcg of fentanyl [place], Carneshia Raker Christine Tereasa Coop RN

## 2013-03-16 NOTE — Discharge Summary (Signed)
[address]       [place] [address]             [phone]       Kathleen Thompson 13-Jul-1934 79 y.o. [id number]  03/14/2013   Loreli Slot, MD  RIGHT LUNG MASS   HPI:  This is a 79 y.o. female who is a lifelong nonsmoker and who was seen and thoracic surgical consultation by Dr. Dorris Fetch. She presented in May with complaints of severe tightness radiating through her back and accompanied by wheezing. She underwent cardiac evaluation including catheterization which showed single-vessel coronary artery disease involving a small RCA. She also had a chest x-ray which showed a right lung nodule. A CT scan revealed 8 x 11 mm right upper lobe nodule. She was seen by Dr. Sherene Sires and followed up with an interval PET/CT which showed the increase in size of the mass as well as showed it was hypermetabolic. Dr. Dorris Fetch evaluated her studies and recommended resection. She was admitted this hospitalization for the procedure. Past Medical History   Diagnosis  Date   .  Internal hemorrhoid    .  Diverticulosis    .  Gastroparesis    .  GERD (gastroesophageal reflux disease)    .  Adenomatous colon polyp  02/2003   .  Cellulitis and abscess of hand    .  Hyperlipidemia    .  PVD (peripheral vascular disease)    .  Hypothyroidism    .  Hypertension    .  Iron deficiency anemia    .  Esophageal stricture    .  Arthritis    .  Blood transfusion    .  Cataract    .  Osteoporosis    .  TIA (transient ischemic attack)     Past Surgical History   Procedure  Laterality  Date   .  Appendectomy     .  Abdominal hysterectomy     .  Cervical fusion     .  Shoulder surgery      Family History   Problem  Relation  Age of Onset   .  Colon cancer  Brother      at age 31   .  Colon cancer  Sister      at age 29   .  Diabetes  Mother    .  Diabetes  Maternal Aunt    .  Diabetes  Maternal Uncle    .  Heart attack  Mother    .  Heart attack  Father    .  Heart  attack  Sister    .  Heart attack  Brother    .  Heart attack  Brother    .  Heart attack  Brother    .  Heart attack  Brother    .  Heart attack  Sister    .  Asthma  Father     Social History  History   Substance Use Topics   .  Smoking status:  Never Smoker   .  Smokeless tobacco:  Never Used   .  Alcohol Use:  No    Current Outpatient Prescriptions   Medication  Sig  Dispense  Refill   .  aspirin 81 MG tablet  Take 81 mg by mouth daily.     Marland Kitchen  atorvastatin (LIPITOR) 40 MG tablet  Take 1 tablet (40 mg total) by mouth daily.  90 tablet  3   .  calcium carbonate 200 MG capsule  Take 250 mg by mouth 2 (two) times daily with a meal.     .  carvedilol (COREG) 6.25 MG tablet  Take 1 tablet (6.25 mg total) by mouth 2 (two) times daily.  180 tablet  3   .  clopidogrel (PLAVIX) 75 MG tablet  Take 1 tablet (75 mg total) by mouth daily.  90 tablet  3   .  co-enzyme Q-10 30 MG capsule  Take 10 mg by mouth daily.     .  furosemide (LASIX) 20 MG tablet  Take 20 mg by mouth daily as needed.     .  isosorbide mononitrate (IMDUR) 30 MG 24 hr tablet  Take 1 tablet (30 mg total) by mouth daily.  30 tablet  3   .  levothyroxine (SYNTHROID, LEVOTHROID) 75 MCG tablet  Take 1 tablet (75 mcg total) by mouth daily.  90 tablet  3   .  losartan-hydrochlorothiazide (HYZAAR) 100-12.5 MG per tablet  1/2 tab twice daily     .  magnesium 30 MG tablet  Take 30 mg by mouth daily.     .  pantoprazole (PROTONIX) 40 MG tablet  Take 1 tablet (40 mg total) by mouth daily. Take 30-60 min before first meal of the day  90 tablet  3   .  Probiotic Product (ALIGN) 4 MG CAPS  Take 1 capsule by mouth daily.  30 capsule  0   .  traMADol (ULTRAM) 50 MG tablet  Take 1 tablet (50 mg total) by mouth every 6 (six) hours as needed for pain.  180 tablet  2   .  vitamin D, CHOLECALCIFEROL, 400 UNITS tablet  Take 400 Units by mouth daily.      No current facility-administered medications for this visit.    Allergies   Allergen   Reactions   .  Rofecoxib      hives   .  Latex  Rash     Pt states that latex causes blisters to skin      Hospital Course:  The patient was admitted and on 03/14/2013 taken to the operating room at which time she underwent the following procedure: DATE OF PROCEDURE: 03/14/2013  DATE OF DISCHARGE:  OPERATIVE REPORT  PREOPERATIVE DIAGNOSIS: Right upper lobe mass.  POSTOPERATIVE DIAGNOSIS: Granuloma, right upper lobe.  PROCEDURES:  1. Right video-assisted thoracoscopy.  2. Wedge resection of right upper lobe nodule.  SURGEON: Salvatore Decent. Dorris Fetch, MD  ASSISTANT: Coral Ceo, PA  ANESTHESIA: General.  FINDINGS: A 1.5-cm mass along the inferior lateral aspect of the right  upper lobe. Wedge resection performed. Frozen section revealed  granulomatous inflammation with clear margins. No tumor seen.  The patient tolerated the procedure well and was taken to the post anesthesia care unit in good condition.   Postoperative hospital course:  Overall the patient has progressed nicely. She has maintained stable hemodynamics. Final pathology is currently pending but as noted above this is felt to be granulomatous disease. The chest tube has been removed using standard protocols. She has a mild postoperative acute blood loss anemia which is stable. Pain is well-controlled. She has been weaned off oxygen. She is tolerating gradually increasing activities using standard protocols. Incision is healing well without evidence of infection. Currently she is felt to be quite stable for discharge on today's date.    Recent Labs  03/15/13 0400 03/16/13 0415  NA 131* 135  K  3.7 3.6  CL 99 100  CO2 25 28  GLUCOSE 144* 117*  BUN 4* 4*  CALCIUM 8.7 8.6    Recent Labs  03/15/13 0400 03/16/13 0415  WBC 8.6 7.2  HGB 12.7 11.6*  HCT 36.2 33.1*  PLT 175 151   No results found for this basename: INR,  in the last 72 hours   Discharge Instructions:  The patient is discharged to home with  extensive instructions on wound care and progressive ambulation.  They are instructed not to drive or perform any heavy lifting until returning to see the physician in his office.  Discharge Diagnosis:  RIGHT LUNG MASS  Secondary Diagnosis: Patient Active Problem List   Diagnosis Date Noted  . Chest pain 12/19/2012  . Granuloma RUL resected 03/14/13  12/19/2012  . FACIAL PAIN 12/12/2009  . ESOPHAGEAL STRICTURE 11/26/2009  . DYSPHAGIA 09/17/2009  . Family history of malignant neoplasm of gastrointestinal tract 09/17/2009  . Cough variant asthma 09/11/2009  . PERSONAL HX COLONIC POLYPS 12/25/2008  . COUGH 07/24/2008  . Dyspnea 03/17/2008  . SYNCOPE 02/02/2008  . PALPITATIONS, RECURRENT 02/02/2008  . UNS ADVRS EFF UNS RX MEDICINAL&BIOLOGICAL SBSTNC 02/02/2008  . ADENOMATOUS COLONIC POLYP 12/23/2007  . GERD 12/23/2007  . GASTROPARESIS 12/23/2007  . CELLULITIS&ABSCESS OF HAND EXCEPT FINGERS&THUMB 06/29/2007  . HYPOTHYROIDISM 03/12/2007  . HYPERLIPIDEMIA 03/12/2007  . HYPERTENSION 03/12/2007  . CORONARY ARTERY DISEASE 03/12/2007  . PERIPHERAL VASCULAR DISEASE 03/12/2007  . INTERNAL HEMORRHOIDS 06/01/2006  . DIVERTICULOSIS, COLON 06/01/2006   Past Medical History  Diagnosis Date  . Internal hemorrhoid   . Diverticulosis   . Gastroparesis   . GERD (gastroesophageal reflux disease)   . Adenomatous colon polyp 02/2003  . Cellulitis and abscess of hand   . Hyperlipidemia   . PVD (peripheral vascular disease)   . Hypothyroidism   . Hypertension   . Iron deficiency anemia   . Esophageal stricture   . Arthritis   . Blood transfusion   . Cataract   . Osteoporosis   . TIA (transient ischemic attack)   . Stroke     TIA  . PONV (postoperative nausea and vomiting)         Medication List         ALIGN 4 MG Caps  Take 1 capsule by mouth daily.     aspirin 81 MG tablet  Take 81 mg by mouth daily.     atorvastatin 40 MG tablet  Commonly known as:  LIPITOR  Take 1 tablet  (40 mg total) by mouth daily.     CALCIUM PO  Take 1 tablet by mouth 2 (two) times daily.     carvedilol 6.25 MG tablet  Commonly known as:  COREG  Take 1 tablet (6.25 mg total) by mouth 2 (two) times daily.     clopidogrel 75 MG tablet  Commonly known as:  PLAVIX  Take 1 tablet (75 mg total) by mouth daily.     CO Q-10 PO  Take 1 tablet by mouth daily.     furosemide 20 MG tablet  Commonly known as:  LASIX  Take 20 mg by mouth daily as needed for fluid or edema.     isosorbide mononitrate 30 MG 24 hr tablet  Commonly known as:  IMDUR  Take 1 tablet (30 mg total) by mouth daily.     levothyroxine 75 MCG tablet  Commonly known as:  SYNTHROID, LEVOTHROID  Take 1 tablet (75 mcg total) by mouth daily.  losartan-hydrochlorothiazide 100-12.5 MG per tablet  Commonly known as:  HYZAAR  Take 0.5 tablets by mouth 2 (two) times daily.     magnesium 30 MG tablet  Take 30 mg by mouth daily.     pantoprazole 40 MG tablet  Commonly known as:  PROTONIX  Take 1 tablet (40 mg total) by mouth daily. Take 30-60 min before first meal of the day     traMADol 50 MG tablet  Commonly known as:  ULTRAM  Take 1 tablet (50 mg total) by mouth every 6 (six) hours as needed for pain.     vitamin D (CHOLECALCIFEROL) 400 UNITS tablet  Take 400 Units by mouth daily.       Follow-up Information   Follow up with Loreli Slot, MD. (August 25 3:45 PM with Dr. Dorris Fetch. Please obtain a chest x-ray and [facility] at 2:45 PM. [facility] is located in the same office complex.)    Contact information:   [address] [place] [address] [phone]       Follow up with [facility] NURSE. (August 12 at 10 AM to have sutures removed by the nurse at Dr. Sunday Corn office)       Disposition: For discharge home  Patient's condition is Good  Gershon Crane, PA-C 03/16/2013  12:44 PM

## 2013-03-17 LAB — TISSUE CULTURE

## 2013-03-29 ENCOUNTER — Other Ambulatory Visit: Payer: Self-pay | Admitting: *Deleted

## 2013-03-29 DIAGNOSIS — R918 Other nonspecific abnormal finding of lung field: Secondary | ICD-10-CM

## 2013-04-04 ENCOUNTER — Ambulatory Visit (INDEPENDENT_AMBULATORY_CARE_PROVIDER_SITE_OTHER): Payer: Self-pay | Admitting: Thoracic Surgery (Cardiothoracic Vascular Surgery)

## 2013-04-04 ENCOUNTER — Ambulatory Visit
Admission: RE | Admit: 2013-04-04 | Discharge: 2013-04-04 | Disposition: A | Discharge status: Home / Self Care | Payer: Medicare Other | Source: Ambulatory Visit | Attending: Thoracic Surgery (Cardiothoracic Vascular Surgery) | Admitting: Thoracic Surgery (Cardiothoracic Vascular Surgery)

## 2013-04-04 ENCOUNTER — Encounter: Payer: Self-pay | Admitting: Thoracic Surgery (Cardiothoracic Vascular Surgery)

## 2013-04-04 VITALS — BP 125/71 | HR 75 | Resp 18 | Ht 61.5 in | Wt 136.0 lb

## 2013-04-04 DIAGNOSIS — J8410 Pulmonary fibrosis, unspecified: Secondary | ICD-10-CM

## 2013-04-04 DIAGNOSIS — J984 Other disorders of lung: Secondary | ICD-10-CM

## 2013-04-04 DIAGNOSIS — R918 Other nonspecific abnormal finding of lung field: Secondary | ICD-10-CM

## 2013-04-04 DIAGNOSIS — Z09 Encounter for follow-up examination after completed treatment for conditions other than malignant neoplasm: Secondary | ICD-10-CM

## 2013-04-04 NOTE — Progress Notes (Signed)
HPI:  Kathleen Thompson returns for a scheduled postop follow up visit. She had a Right VATS and wedge resection on 03/14/13. The lesion turned out to be a granuloma. Cultures are negative to date.  She has some incisional pain when she reaches for things with her right arm. Pain well controlled with tramadol. She also complains of a poor appetite and lack of energy.   Past Medical History  Diagnosis Date  . Internal hemorrhoid   . Diverticulosis   . Gastroparesis   . GERD (gastroesophageal reflux disease)   . Adenomatous colon polyp 02/2003  . Cellulitis and abscess of hand   . Hyperlipidemia   . PVD (peripheral vascular disease)   . Hypothyroidism   . Hypertension   . Iron deficiency anemia   . Esophageal stricture   . Arthritis   . Blood transfusion   . Cataract   . Osteoporosis   . TIA (transient ischemic attack)   . Stroke     TIA  . PONV (postoperative nausea and vomiting)       Current Outpatient Prescriptions  Medication Sig Dispense Refill  . aspirin 81 MG tablet Take 81 mg by mouth daily.      Marland Kitchen atorvastatin (LIPITOR) 40 MG tablet Take 1 tablet (40 mg total) by mouth daily.  90 tablet  3  . CALCIUM PO Take 1 tablet by mouth 2 (two) times daily.      . carvedilol (COREG) 6.25 MG tablet Take 1 tablet (6.25 mg total) by mouth 2 (two) times daily.  180 tablet  3  . clopidogrel (PLAVIX) 75 MG tablet Take 1 tablet (75 mg total) by mouth daily.  90 tablet  3  . Coenzyme Q10 (CO Q-10 PO) Take 1 tablet by mouth daily.      . isosorbide mononitrate (IMDUR) 30 MG 24 hr tablet Take 1 tablet (30 mg total) by mouth daily.  30 tablet  3  . levothyroxine (SYNTHROID, LEVOTHROID) 75 MCG tablet Take 1 tablet (75 mcg total) by mouth daily.  90 tablet  3  . losartan-hydrochlorothiazide (HYZAAR) 100-12.5 MG per tablet Take 0.5 tablets by mouth 2 (two) times daily.       . magnesium 30 MG tablet Take 30 mg by mouth daily.        . pantoprazole (PROTONIX) 40 MG tablet Take 1 tablet (40 mg  total) by mouth daily. Take 30-60 min before first meal of the day  90 tablet  3  . Probiotic Product (ALIGN) 4 MG CAPS Take 1 capsule by mouth daily.  30 capsule  0  . traMADol (ULTRAM) 50 MG tablet Take 1 tablet (50 mg total) by mouth every 6 (six) hours as needed for pain.  180 tablet  2  . vitamin D, CHOLECALCIFEROL, 400 UNITS tablet Take 400 Units by mouth daily.         No current facility-administered medications for this visit.    Physical Exam BP 125/71  Pulse 75  Resp 18  Ht 5' 1.5" (1.562 m)  Wt 136 lb (61.689 kg)  BMI 25.28 kg/m2  SpO2 99%  79 yo woman in NAD Well developed, well nourished Lungs clear with equal breath sounds Incisions healing well  Diagnostic Tests: CXR shows postop changes, otherwise NAD   Impression: 79 yo woman now 3 weeks out from a VATS wedge resection. She is doing extremely well. It is not unusual to still have some pain at this point in time. She had granulomatous disease and does  not need any other specific follow up. Cultures are negative to date but won't be final for another week or so.  There are no specific restrictions on her activity, but she was advised to gradually resume her normal activities over the next few weeks. She may drive, appropriate precautions were advised.  Plan: She will follow up with Dr. Lovell Sheehan

## 2013-04-04 NOTE — Progress Notes (Signed)
Patient ID: Kathleen Thompson, female   DOB: 1934/04/12, 79 y.o.   MRN: [id number]

## 2013-04-08 LAB — FUNGUS CULTURE W SMEAR: Fungal Smear: NONE SEEN

## 2013-04-12 ENCOUNTER — Other Ambulatory Visit: Payer: Self-pay | Admitting: Internal Medicine

## 2013-04-27 LAB — AFB CULTURE WITH SMEAR (NOT AT ARMC): Acid Fast Smear: NONE SEEN

## 2013-05-27 ENCOUNTER — Ambulatory Visit: Payer: Medicare Other | Admitting: Internal Medicine

## 2013-06-03 ENCOUNTER — Encounter: Payer: Self-pay | Admitting: Internal Medicine

## 2013-06-03 ENCOUNTER — Ambulatory Visit (INDEPENDENT_AMBULATORY_CARE_PROVIDER_SITE_OTHER): Payer: Medicare Other | Admitting: Internal Medicine

## 2013-06-03 VITALS — BP 110/70 | Temp 98.0°F | Wt 134.0 lb

## 2013-06-03 DIAGNOSIS — K219 Gastro-esophageal reflux disease without esophagitis: Secondary | ICD-10-CM

## 2013-06-03 DIAGNOSIS — E039 Hypothyroidism, unspecified: Secondary | ICD-10-CM

## 2013-06-03 DIAGNOSIS — I10 Essential (primary) hypertension: Secondary | ICD-10-CM

## 2013-06-03 MED ORDER — ISOSORBIDE MONONITRATE ER 30 MG PO TB24: ORAL_TABLET | ORAL | Status: DC

## 2013-06-03 MED ORDER — LOSARTAN POTASSIUM-HCTZ 100-12.5 MG PO TABS: 0.5000 | ORAL_TABLET | Freq: Two times a day (BID) | ORAL | Status: DC

## 2013-06-03 NOTE — Progress Notes (Signed)
Subjective:    Patient ID: Kathleen Thompson, female    DOB: 07-29-1934, 79 y.o.   MRN: [id number]  HPI Patient is 79 year old female followed for coronary disease cough. Asthma and reviewed recent hospitalization. For followup of hypothyroidism hypertension reflux esophagitis coronary dz Stable on medications Bloodwork reviewed from hospitalization  Review of Systems  Constitutional: Negative.   HENT: Negative.   Eyes: Negative.   Respiratory: Negative.   Cardiovascular: Negative.  Negative for chest pain, palpitations and leg swelling.  Gastrointestinal: Negative.   Neurological: Negative.    Past Medical History  Diagnosis Date  . Internal hemorrhoid   . Diverticulosis   . Gastroparesis   . GERD (gastroesophageal reflux disease)   . Adenomatous colon polyp 02/2003  . Cellulitis and abscess of hand   . Hyperlipidemia   . PVD (peripheral vascular disease)   . Hypothyroidism   . Hypertension   . Iron deficiency anemia   . Esophageal stricture   . Arthritis   . Blood transfusion   . Cataract   . Osteoporosis   . TIA (transient ischemic attack)   . Stroke     TIA  . PONV (postoperative nausea and vomiting)     History   Social History  . Marital Status: Married    Spouse Name: N/A    Number of Children: N/A  . Years of Education: N/A   Occupational History  . Retired-[employer]     Social History Main Topics  . Smoking status: Never Smoker   . Smokeless tobacco: Never Used  . Alcohol Use: No  . Drug Use: No  . Sexual Activity: Yes   Other Topics Concern  . Not on file   Social History Narrative  . No narrative on file    Past Surgical History  Procedure Laterality Date  . Appendectomy    . Abdominal hysterectomy    . Cervical fusion    . Shoulder surgery     . Video assisted thoracoscopy (vats)/wedge resection Right 03/14/2013    Procedure: VIDEO ASSISTED THORACOSCOPY (VATS)/WEDGE RESECTION;  Surgeon: Loreli Slot, MD;  Location: [facility] OR;  Service:  Thoracic;  Laterality: Right;    Family History  Problem Relation Age of Onset  . Colon cancer Brother     at age 51  . Colon cancer Sister     at age 44  . Diabetes Mother   . Diabetes Maternal Aunt   . Diabetes Maternal Uncle   . Heart attack Mother   . Heart attack Father   . Heart attack Sister   . Heart attack Brother   . Heart attack Brother   . Heart attack Brother   . Heart attack Brother   . Heart attack Sister   . Asthma Father     Allergies  Allergen Reactions  . Rofecoxib     hives  . Latex Rash    Pt states that latex causes blisters to skin    Current Outpatient Prescriptions on File Prior to Visit  Medication Sig Dispense Refill  . aspirin 81 MG tablet Take 81 mg by mouth daily.      Marland Kitchen atorvastatin (LIPITOR) 40 MG tablet Take 1 tablet (40 mg total) by mouth daily.  90 tablet  3  . CALCIUM PO Take 1 tablet by mouth 2 (two) times daily.      . clopidogrel (PLAVIX) 75 MG tablet Take 1 tablet (75 mg total) by mouth daily.  90 tablet  3  . Coenzyme Q10 (  CO Q-10 PO) Take 1 tablet by mouth daily.      Marland Kitchen levothyroxine (SYNTHROID, LEVOTHROID) 75 MCG tablet Take 1 tablet (75 mcg total) by mouth daily.  90 tablet  3  . magnesium 30 MG tablet Take 30 mg by mouth daily.        . pantoprazole (PROTONIX) 40 MG tablet Take 1 tablet (40 mg total) by mouth daily. Take 30-60 min before first meal of the day  90 tablet  3  . Probiotic Product (ALIGN) 4 MG CAPS Take 1 capsule by mouth daily.  30 capsule  0  . traMADol (ULTRAM) 50 MG tablet TAKE 1 TABLET (50 MG TOTAL) BY MOUTH EVERY 6 (SIX) HOURS AS NEEDED FOR PAIN.  180 tablet  0  . vitamin D, CHOLECALCIFEROL, 400 UNITS tablet Take 400 Units by mouth daily.        . carvedilol (COREG) 6.25 MG tablet Take 1 tablet (6.25 mg total) by mouth 2 (two) times daily.  180 tablet  3   No current facility-administered medications on file prior to visit.    BP 110/70  Temp(Src) 98 F (36.7 C) (Oral)  Wt 134 lb (60.782 kg)  BMI  24.91 kg/m2       Objective:   Physical Exam  Constitutional: She is oriented to person, place, and time. She appears well-developed and well-nourished. No distress.  HENT:  Head: Normocephalic and atraumatic.  Right Ear: External ear normal.  Left Ear: External ear normal.  Nose: Nose normal.  Mouth/Throat: Oropharynx is clear and moist.  Eyes: Conjunctivae and EOM are normal. Pupils are equal, round, and reactive to light.  Neck: Normal range of motion. Neck supple. No JVD present. No tracheal deviation present. No thyromegaly present.  Cardiovascular: Normal rate and regular rhythm.   Murmur heard. Pulmonary/Chest: Effort normal and breath sounds normal. She has no wheezes. She exhibits no tenderness.  Abdominal: Soft. Bowel sounds are normal.  Musculoskeletal: She exhibits no edema and no tenderness.  Lymphadenopathy:    She has no cervical adenopathy.  Neurological: She is alert and oriented to person, place, and time. No cranial nerve deficit.  Skin: Skin is warm and dry. She is not diaphoretic.  bruising due to the Plavix          Assessment & Plan:  In reviewing her hospital blood work she had all appropriate screening except for her thyroid so we will order a TSH and T4 free Stable hypertension on Hyzaar Easier bruising on Plavix showing effectiveness medications stable GERD on Protonix

## 2013-06-15 ENCOUNTER — Telehealth: Payer: Self-pay | Admitting: *Deleted

## 2013-06-15 NOTE — Telephone Encounter (Signed)
Spoke with the pt  She states that she does not need to have ct chest now  She had surgery in August and had cxr then  She seemed upset that I called her to set this up  Please advise, thanks!

## 2013-06-15 NOTE — Telephone Encounter (Signed)
Message copied by Christen Butter on Wed Jun 15, 2013  9:17 AM ------      Message from: Kathleen Thompson      Created: Wed Feb 09, 2013 11:52 AM       Make sure she has a f/u CT chest limited to rul nodule this month ------

## 2013-06-15 NOTE — Telephone Encounter (Signed)
Discussed with pt the purpose of the tickle file phone call and confirmed the nodule was resected, is benign, and she's doing fine

## 2013-06-16 ENCOUNTER — Other Ambulatory Visit: Payer: Self-pay

## 2013-07-12 ENCOUNTER — Other Ambulatory Visit: Payer: Self-pay | Admitting: Internal Medicine

## 2013-07-31 ENCOUNTER — Other Ambulatory Visit: Payer: Self-pay | Admitting: Internal Medicine

## 2013-08-15 ENCOUNTER — Other Ambulatory Visit: Payer: Self-pay | Admitting: Internal Medicine

## 2013-10-08 ENCOUNTER — Other Ambulatory Visit: Payer: Self-pay | Admitting: Internal Medicine

## 2013-10-21 ENCOUNTER — Encounter: Payer: Self-pay | Admitting: Internal Medicine

## 2013-10-21 ENCOUNTER — Ambulatory Visit (INDEPENDENT_AMBULATORY_CARE_PROVIDER_SITE_OTHER): Payer: Medicare Other | Admitting: Internal Medicine

## 2013-10-21 ENCOUNTER — Ambulatory Visit: Payer: Medicare Other | Admitting: Internal Medicine

## 2013-10-21 VITALS — BP 124/74 | HR 68 | Temp 98.2°F | Ht 61.5 in | Wt 131.0 lb

## 2013-10-21 DIAGNOSIS — T887XXA Unspecified adverse effect of drug or medicament, initial encounter: Secondary | ICD-10-CM

## 2013-10-21 DIAGNOSIS — Z23 Encounter for immunization: Secondary | ICD-10-CM

## 2013-10-21 DIAGNOSIS — E785 Hyperlipidemia, unspecified: Secondary | ICD-10-CM

## 2013-10-21 DIAGNOSIS — M6530 Trigger finger, unspecified finger: Secondary | ICD-10-CM

## 2013-10-21 LAB — COMPREHENSIVE METABOLIC PANEL
ALK PHOS: 85 U/L (ref 39–117)
ALT: 21 U/L (ref 0–35)
AST: 29 U/L (ref 0–37)
Albumin: 4.5 g/dL (ref 3.5–5.2)
BILIRUBIN TOTAL: 0.9 mg/dL (ref 0.3–1.2)
BUN: 7 mg/dL (ref 6–23)
CALCIUM: 9.5 mg/dL (ref 8.4–10.5)
CO2: 28 mEq/L (ref 19–32)
Chloride: 101 mEq/L (ref 96–112)
Creatinine, Ser: 0.9 mg/dL (ref 0.4–1.2)
GFR: 67 mL/min (ref >60.00)
Glucose, Bld: 100 mg/dL — ABNORMAL HIGH (ref 70–99)
Potassium: 3.7 mEq/L (ref 3.5–5.1)
Sodium: 137 mEq/L (ref 135–145)
Total Protein: 7.3 g/dL (ref 6.0–8.3)

## 2013-10-21 LAB — LDL CHOLESTEROL, DIRECT: Direct LDL: 98.9 mg/dL

## 2013-10-21 NOTE — Progress Notes (Signed)
Pre visit review using our clinic review tool, if applicable. No additional management support is needed unless otherwise documented below in the visit note. 

## 2013-10-21 NOTE — Patient Instructions (Addendum)
The patient is instructed to continue all medications as prescribed. Schedule followup with check out clerk upon leaving the clinic  

## 2013-10-21 NOTE — Progress Notes (Signed)
Subjective:    Patient ID: Kathleen Thompson, female    DOB: 1933/08/26, 80 y.o.   MRN: [id number]  HPI monitoring lipid, blood pressure medication and thyroid  Discussed the last TSH and T4 free and she is on good replacement Today we need liver, lipid and BMEt for HTN, lipid management and drug management   Review of Systems  Constitutional: Positive for fatigue. Negative for activity change and appetite change.  HENT: Negative for congestion, ear pain, postnasal drip and sinus pressure.   Eyes: Negative for redness and visual disturbance.  Respiratory: Negative for cough, shortness of breath and wheezing.   Gastrointestinal: Negative for abdominal pain and abdominal distention.  Genitourinary: Negative for dysuria, frequency and menstrual problem.  Musculoskeletal: Negative for arthralgias, joint swelling, myalgias and neck pain.  Skin: Negative for rash and wound.  Neurological: Positive for tremors and weakness. Negative for dizziness and headaches.  Hematological: Negative for adenopathy. Does not bruise/bleed easily.  Psychiatric/Behavioral: Negative for sleep disturbance and decreased concentration.   Past Medical History  Diagnosis Date  . Internal hemorrhoid   . Diverticulosis   . Gastroparesis   . GERD (gastroesophageal reflux disease)   . Adenomatous colon polyp 02/2003  . Cellulitis and abscess of hand   . Hyperlipidemia   . PVD (peripheral vascular disease)   . Hypothyroidism   . Hypertension   . Iron deficiency anemia   . Esophageal stricture   . Arthritis   . Blood transfusion   . Cataract   . Osteoporosis   . TIA (transient ischemic attack)   . Stroke     TIA  . PONV (postoperative nausea and vomiting)     History   Social History  . Marital Status: Married    Spouse Name: N/A    Number of Children: N/A  . Years of Education: N/A   Occupational History  . Retired-[employer]     Social History Main Topics  . Smoking status: Never Smoker   .  Smokeless tobacco: Never Used  . Alcohol Use: No  . Drug Use: No  . Sexual Activity: Yes   Other Topics Concern  . Not on file   Social History Narrative  . No narrative on file    Past Surgical History  Procedure Laterality Date  . Appendectomy    . Abdominal hysterectomy    . Cervical fusion    . Shoulder surgery     . Video assisted thoracoscopy (vats)/wedge resection Right 03/14/2013    Procedure: VIDEO ASSISTED THORACOSCOPY (VATS)/WEDGE RESECTION;  Surgeon: Melrose Nakayama, MD;  Location: [facility];  Service: Thoracic;  Laterality: Right;    Family History  Problem Relation Age of Onset  . Colon cancer Brother     at age 46  . Colon cancer Sister     at age 56  . Diabetes Mother   . Diabetes Maternal Aunt   . Diabetes Maternal Uncle   . Heart attack Mother   . Heart attack Father   . Heart attack Sister   . Heart attack Brother   . Heart attack Brother   . Heart attack Brother   . Heart attack Brother   . Heart attack Sister   . Asthma Father     Allergies  Allergen Reactions  . Rofecoxib     hives  . Latex Rash    Pt states that latex causes blisters to skin    Current Outpatient Prescriptions on File Prior to Visit  Medication Sig  Dispense Refill  . aspirin 81 MG tablet Take 81 mg by mouth daily.      Marland Kitchen atorvastatin (LIPITOR) 40 MG tablet TAKE 1 TABLET (40 MG TOTAL) BY MOUTH DAILY.  90 tablet  3  . CALCIUM PO Take 1 tablet by mouth 2 (two) times daily.      . carvedilol (COREG) 6.25 MG tablet Take 1 tablet (6.25 mg total) by mouth 2 (two) times daily.  180 tablet  3  . clopidogrel (PLAVIX) 75 MG tablet TAKE 1 TABLET (75 MG TOTAL) BY MOUTH DAILY.  90 tablet  3  . Coenzyme Q10 (CO Q-10 PO) Take 1 tablet by mouth daily.      . isosorbide mononitrate (IMDUR) 30 MG 24 hr tablet TAKE 1 TABLET BY MOUTH DAILY  30 tablet  3  . levothyroxine (SYNTHROID, LEVOTHROID) 75 MCG tablet TAKE 1 TABLET (75 MCG TOTAL) BY MOUTH DAILY.  90 tablet  3  .  losartan-hydrochlorothiazide (HYZAAR) 100-12.5 MG per tablet Take 0.5 tablets by mouth 2 (two) times daily.  180 tablet  3  . magnesium 30 MG tablet Take 30 mg by mouth daily.        . pantoprazole (PROTONIX) 40 MG tablet Take 1 tablet (40 mg total) by mouth daily. Take 30-60 min before first meal of the day  90 tablet  3  . Probiotic Product (ALIGN) 4 MG CAPS Take 1 capsule by mouth daily.  30 capsule  0  . traMADol (ULTRAM) 50 MG tablet TAKE 1 TABLET EVERY 6 HOURS AS NEEDED FOR PAIN  180 tablet  0  . vitamin D, CHOLECALCIFEROL, 400 UNITS tablet Take 400 Units by mouth daily.         No current facility-administered medications on file prior to visit.    BP 124/74  Pulse 68  Temp(Src) 98.2 F (36.8 C) (Oral)  Ht 5' 1.5" (1.562 m)  Wt 131 lb (59.421 kg)  BMI 24.35 kg/m2       Objective:   Physical Exam  Constitutional: She is oriented to person, place, and time. She appears well-developed and well-nourished. No distress.  HENT:  Head: Normocephalic and atraumatic.  Eyes: Conjunctivae and EOM are normal. Pupils are equal, round, and reactive to light.  Neck: Normal range of motion. Neck supple. No JVD present. No tracheal deviation present. No thyromegaly present.  Cardiovascular: Normal rate, regular rhythm and intact distal pulses.   Murmur heard. Pulmonary/Chest: Effort normal and breath sounds normal. She has no wheezes. She exhibits no tenderness.  Abdominal: Soft. Bowel sounds are normal.  Musculoskeletal: Normal range of motion. She exhibits no edema and no tenderness.  Lymphadenopathy:    She has no cervical adenopathy.  Neurological: She is alert and oriented to person, place, and time. She has normal reflexes. No cranial nerve deficit.  Skin: Skin is warm and dry. She is not diaphoretic.          Assessment & Plan:  reviewed recent virus infection Heart stable, no CP or SOB Blood pressure good Weight loss and great BMI  Probiotic rotation prevnar  today  Trigger finnger injection

## 2013-10-21 NOTE — Addendum Note (Signed)
Addended by: Townsend Roger D on: 10/21/2013 11:52 AM   Modules accepted: Orders

## 2013-10-26 ENCOUNTER — Other Ambulatory Visit: Payer: Self-pay | Admitting: Internal Medicine

## 2013-11-30 ENCOUNTER — Other Ambulatory Visit: Payer: Self-pay | Admitting: Internal Medicine

## 2013-12-06 ENCOUNTER — Encounter: Payer: Self-pay | Admitting: Internal Medicine

## 2014-02-03 ENCOUNTER — Ambulatory Visit: Payer: Medicare Other | Admitting: Internal Medicine

## 2014-02-16 ENCOUNTER — Other Ambulatory Visit: Payer: Self-pay | Admitting: Internal Medicine

## 2014-03-02 ENCOUNTER — Telehealth: Payer: Self-pay | Admitting: Internal Medicine

## 2014-03-02 NOTE — Telephone Encounter (Signed)
Charlie called from [place] office and would like to know if pt can stop her plavix  5 days  prior to dental surgery. Surgery has not been scheduled yet.  Phone number [phone]

## 2014-03-03 NOTE — Telephone Encounter (Signed)
Attempted to call but office is closed. Will call at a later time.

## 2014-03-03 NOTE — Telephone Encounter (Signed)
Ok per Dr Jenkins 

## 2014-03-09 NOTE — Telephone Encounter (Signed)
Pt already had surgery this am.

## 2014-03-11 ENCOUNTER — Emergency Department (HOSPITAL_BASED_OUTPATIENT_CLINIC_OR_DEPARTMENT_OTHER)
Admission: EM | Admit: 2014-03-11 | Discharge: 2014-03-11 | Disposition: A | Discharge status: Home / Self Care | Payer: Medicare Other | Attending: Emergency Medicine | Admitting: Emergency Medicine

## 2014-03-11 ENCOUNTER — Encounter (HOSPITAL_BASED_OUTPATIENT_CLINIC_OR_DEPARTMENT_OTHER): Payer: Self-pay | Admitting: Emergency Medicine

## 2014-03-11 DIAGNOSIS — L299 Pruritus, unspecified: Secondary | ICD-10-CM | POA: Diagnosis not present

## 2014-03-11 DIAGNOSIS — M810 Age-related osteoporosis without current pathological fracture: Secondary | ICD-10-CM | POA: Insufficient documentation

## 2014-03-11 DIAGNOSIS — Z8673 Personal history of transient ischemic attack (TIA), and cerebral infarction without residual deficits: Secondary | ICD-10-CM | POA: Insufficient documentation

## 2014-03-11 DIAGNOSIS — Z862 Personal history of diseases of the blood and blood-forming organs and certain disorders involving the immune mechanism: Secondary | ICD-10-CM | POA: Diagnosis not present

## 2014-03-11 DIAGNOSIS — E039 Hypothyroidism, unspecified: Secondary | ICD-10-CM | POA: Diagnosis not present

## 2014-03-11 DIAGNOSIS — Z91040 Latex allergy status: Secondary | ICD-10-CM | POA: Diagnosis not present

## 2014-03-11 DIAGNOSIS — IMO0002 Reserved for concepts with insufficient information to code with codable children: Secondary | ICD-10-CM | POA: Diagnosis not present

## 2014-03-11 DIAGNOSIS — T7840XA Allergy, unspecified, initial encounter: Secondary | ICD-10-CM

## 2014-03-11 DIAGNOSIS — Z7982 Long term (current) use of aspirin: Secondary | ICD-10-CM | POA: Diagnosis not present

## 2014-03-11 DIAGNOSIS — Z79899 Other long term (current) drug therapy: Secondary | ICD-10-CM | POA: Insufficient documentation

## 2014-03-11 DIAGNOSIS — R1110 Vomiting, unspecified: Secondary | ICD-10-CM | POA: Diagnosis not present

## 2014-03-11 DIAGNOSIS — T368X5A Adverse effect of other systemic antibiotics, initial encounter: Secondary | ICD-10-CM | POA: Diagnosis not present

## 2014-03-11 DIAGNOSIS — R209 Unspecified disturbances of skin sensation: Secondary | ICD-10-CM | POA: Insufficient documentation

## 2014-03-11 DIAGNOSIS — Z8719 Personal history of other diseases of the digestive system: Secondary | ICD-10-CM | POA: Insufficient documentation

## 2014-03-11 DIAGNOSIS — E785 Hyperlipidemia, unspecified: Secondary | ICD-10-CM | POA: Diagnosis not present

## 2014-03-11 DIAGNOSIS — M129 Arthropathy, unspecified: Secondary | ICD-10-CM | POA: Diagnosis not present

## 2014-03-11 DIAGNOSIS — Z7902 Long term (current) use of antithrombotics/antiplatelets: Secondary | ICD-10-CM | POA: Insufficient documentation

## 2014-03-11 DIAGNOSIS — Z860100 Personal history of colon polyps, unspecified: Secondary | ICD-10-CM | POA: Insufficient documentation

## 2014-03-11 DIAGNOSIS — Z8669 Personal history of other diseases of the nervous system and sense organs: Secondary | ICD-10-CM | POA: Diagnosis not present

## 2014-03-11 DIAGNOSIS — I10 Essential (primary) hypertension: Secondary | ICD-10-CM | POA: Insufficient documentation

## 2014-03-11 MED ORDER — DEXAMETHASONE SODIUM PHOSPHATE 10 MG/ML IJ SOLN
10.0000 mg | Freq: Once | INTRAMUSCULAR | Status: AC
  Administered 2014-03-11: 10 mg via INTRAMUSCULAR
  Filled 2014-03-11: qty 1

## 2014-03-11 MED ORDER — HYDROXYZINE HCL 25 MG PO TABS: 25.0000 mg | ORAL_TABLET | Freq: Four times a day (QID) | ORAL | Status: DC

## 2014-03-11 MED ORDER — HYDROXYZINE HCL 25 MG PO TABS
25.0000 mg | ORAL_TABLET | Freq: Once | ORAL | Status: AC
  Administered 2014-03-11: 25 mg via ORAL
  Filled 2014-03-11: qty 1

## 2014-03-11 MED ORDER — PREDNISONE 10 MG PO TABS: 20.0000 mg | ORAL_TABLET | Freq: Every day | ORAL | Status: DC

## 2014-03-11 NOTE — ED Notes (Signed)
Pt discharged to home with family. NAD.  

## 2014-03-11 NOTE — Discharge Instructions (Signed)
Drug Allergy °Allergic reactions to medicines are common. Some allergic reactions are mild. A delayed type of drug allergy that occurs 1 week or more after exposure to a medicine or vaccine is called serum sickness. A life-threatening, sudden (acute) allergic reaction that involves the whole body is called anaphylaxis. °CAUSES  °"True" drug allergies occur when there is an allergic reaction to a medicine. This is caused by overactivity of the immune system. First, the body becomes sensitized. The immune system is triggered by your first exposure to the medicine. Following this first exposure, future exposure to the same medicine may be life-threatening. °Almost any medicine can cause an allergic reaction. Common ones are: °· Penicillin. °· Sulfonamides (sulfa drugs). °· Local anesthetics. °· X-ray dyes that contain iodine. °SYMPTOMS  °Common symptoms of a minor allergic reaction are: °· Swelling around the mouth. °· An itchy red rash or hives. °· Vomiting or diarrhea. °Anaphylaxis can cause swelling of the mouth and throat. This makes it difficult to breathe and swallow. Severe reactions can be fatal within seconds, even after exposure to only a trace amount of the drug that causes the reaction. °HOME CARE INSTRUCTIONS  °· If you are unsure of what caused your reaction, keep a diary of foods and medicines used. Include the symptoms that followed. Avoid anything that causes reactions. °· You may want to follow up with an allergy specialist after the reaction has cleared in order to be tested to confirm the allergy. It is important to confirm that your reaction is an allergy, not just a side effect to the medicine. If you have a true allergy to a medicine, this may prevent that medicine and related medicines from being given to you when you are very ill. °· If you have hives or a rash: °¨ Take medicines as directed by your caregiver. °¨ You may use an over-the-counter antihistamine (diphenhydramine) as  needed. °¨ Apply cold compresses to the skin or take baths in cool water. Avoid hot baths or showers. °· If you are severely allergic: °¨ Continuous observation after a severe reaction may be needed. Hospitalization is often required. °¨ Wear a medical alert bracelet or necklace stating your allergy. °¨ You and your family must learn how to use an anaphylaxis kit or give an epinephrine injection to temporarily treat an emergency allergic reaction. If you have had a severe reaction, always carry your epinephrine injection or anaphylaxis kit with you. This can be lifesaving if you have a severe reaction. °· Do not drive or perform tasks after treatment until the medicines used to treat your reaction have worn off, or until your caregiver says it is okay. °SEEK MEDICAL CARE IF:  °· You think you had an allergic reaction. Symptoms usually start within 30 minutes after exposure. °· Symptoms are getting worse rather than better. °· You develop new symptoms. °· The symptoms that brought you to your caregiver return. °SEEK IMMEDIATE MEDICAL CARE IF:  °· You have swelling of the mouth, difficulty breathing, or wheezing. °· You have a tight feeling in your chest or throat. °· You develop hives, swelling, or itching all over your body. °· You develop severe vomiting or diarrhea. °· You feel faint or pass out. °This is an emergency. Use your epinephrine injection or anaphylaxis kit as you have been instructed. Call for emergency medical help. Even if you improve after the injection, you need to be examined at a hospital emergency department. °MAKE SURE YOU:  °· Understand these instructions. °· Will watch   your condition.  Will get help right away if you are not doing well or get worse. Document Released: 07/28/2005 Document Revised: 10/20/2011 Document Reviewed: 01/01/2011 [facility] Patient Information 2015 [place], [place]. This information is not intended to replace advice given to you by your health care provider. Make  sure you discuss any questions you have with your health care provider.

## 2014-03-11 NOTE — ED Notes (Addendum)
Patient started clindamycin last Friday 7/24. Patient had dental surgery on Thursday and yesterday vomited and noticed 1 red raised area on arm, on awakening this am was covered in red rash with itching. Also complains of burning to skin. Has not had medication this am. No respiratory distress

## 2014-03-11 NOTE — ED Provider Notes (Signed)
CSN: [id number]     Arrival date & time 03/11/14  1100 History   First MD Initiated Contact with Patient 03/11/14 1152     Chief Complaint  Patient presents with  . Medication Reaction     (Consider location/radiation/quality/duration/timing/severity/associated sxs/prior Treatment) HPI Comments: Patient presents with a rash and itching. She states that about a week and half ago she had some dental surgery. She's been on clindamycin for about 7 days. Yesterday she noticed a small red area to her left upper arm it was itchy. Today she woke up with a diffuse rash everywhere. She says that it's intensely itchy. She denies any facial swelling. She denies any wheezing or shortness of breath. She has not taken any medications for it. She denies any history of allergic reactions in the past other than to Lasix. She denies any other new medications other than the clindamycin. She says the rash is worsened throughout the day. She denies any other general eyes: This is such as fever, vomiting, or shortness of breath. She did have one episode of vomiting yesterday after she used some chlorhexidine mouthwash for the first time and she attributed the vomiting to using that mouthwash.   Past Medical History  Diagnosis Date  . Internal hemorrhoid   . Diverticulosis   . Gastroparesis   . GERD (gastroesophageal reflux disease)   . Adenomatous colon polyp 02/2003  . Cellulitis and abscess of hand   . Hyperlipidemia   . PVD (peripheral vascular disease)   . Hypothyroidism   . Hypertension   . Iron deficiency anemia   . Esophageal stricture   . Arthritis   . Blood transfusion   . Cataract   . Osteoporosis   . TIA (transient ischemic attack)   . Stroke     TIA  . PONV (postoperative nausea and vomiting)    Past Surgical History  Procedure Laterality Date  . Appendectomy    . Abdominal hysterectomy    . Cervical fusion    . Shoulder surgery     . Video assisted thoracoscopy (vats)/wedge resection  Right 03/14/2013    Procedure: VIDEO ASSISTED THORACOSCOPY (VATS)/WEDGE RESECTION;  Surgeon: Melrose Nakayama, MD;  Location: [facility];  Service: Thoracic;  Laterality: Right;   Family History  Problem Relation Age of Onset  . Colon cancer Brother     at age 53  . Colon cancer Sister     at age 63  . Diabetes Mother   . Diabetes Maternal Aunt   . Diabetes Maternal Uncle   . Heart attack Mother   . Heart attack Father   . Heart attack Sister   . Heart attack Brother   . Heart attack Brother   . Heart attack Brother   . Heart attack Brother   . Heart attack Sister   . Asthma Father    History  Substance Use Topics  . Smoking status: Never Smoker   . Smokeless tobacco: Never Used  . Alcohol Use: No   OB History   Grav Para Term Preterm Abortions TAB SAB Ect Mult Living                 Review of Systems  Constitutional: Negative for fever, chills, diaphoresis and fatigue.  HENT: Negative for congestion, rhinorrhea and sneezing.   Eyes: Negative.   Respiratory: Negative for cough, chest tightness and shortness of breath.   Cardiovascular: Negative for chest pain and leg swelling.  Gastrointestinal: Positive for vomiting (one episode yesterday). Negative  for nausea, abdominal pain, diarrhea and blood in stool.  Genitourinary: Negative for frequency, hematuria, flank pain and difficulty urinating.  Musculoskeletal: Negative for arthralgias and back pain.  Skin: Positive for rash.  Neurological: Negative for dizziness, speech difficulty, weakness, numbness and headaches.      Allergies  Clindamycin/lincomycin; Rofecoxib; and Latex  Home Medications   Prior to Admission medications   Medication Sig Start Date End Date Taking? Authorizing Provider  aspirin 81 MG tablet Take 81 mg by mouth daily.    Historical Provider, MD  atorvastatin (LIPITOR) 40 MG tablet TAKE 1 TABLET (40 MG TOTAL) BY MOUTH DAILY. 07/31/13   Ricard Dillon, MD  CALCIUM PO Take 1 tablet by mouth 2  (two) times daily.    Historical Provider, MD  carvedilol (COREG) 6.25 MG tablet TAKE 1 TABLET (6.25 MG TOTAL) BY MOUTH 2 (TWO) TIMES DAILY. 11/30/13   Ricard Dillon, MD  clopidogrel (PLAVIX) 75 MG tablet TAKE 1 TABLET (75 MG TOTAL) BY MOUTH DAILY. 07/12/13   Ricard Dillon, MD  Coenzyme Q10 (CO Q-10 PO) Take 1 tablet by mouth daily.    Historical Provider, MD  hydrOXYzine (ATARAX/VISTARIL) 25 MG tablet Take 1 tablet (25 mg total) by mouth every 6 (six) hours. 03/11/14   Malvin Johns, MD  isosorbide mononitrate (IMDUR) 30 MG 24 hr tablet TAKE 1 TABLET BY MOUTH DAILY 08/15/13   Ricard Dillon, MD  levothyroxine (SYNTHROID, LEVOTHROID) 75 MCG tablet TAKE 1 TABLET (75 MCG TOTAL) BY MOUTH DAILY.    Ricard Dillon, MD  losartan-hydrochlorothiazide [facility]) 100-12.5 MG per tablet Take 0.5 tablets by mouth 2 (two) times daily. 06/03/13   Ricard Dillon, MD  magnesium 30 MG tablet Take 30 mg by mouth daily.      Historical Provider, MD  pantoprazole (PROTONIX) 40 MG tablet TAKE 1 TABLET (40 MG TOTAL) BY MOUTH DAILY. TAKE 30-60 MIN BEFORE FIRST MEAL OF THE DAY 02/16/14   Ricard Dillon, MD  predniSONE (DELTASONE) 10 MG tablet Take 2 tablets (20 mg total) by mouth daily. 03/11/14   Malvin Johns, MD  Probiotic Product (ALIGN) 4 MG CAPS Take 1 capsule by mouth daily. 07/14/11   Ladene Artist, MD  traMADol (ULTRAM) 50 MG tablet TAKE 1 TABLET EVERY 6 HOURS AS NEEDED FOR PAIN    Ricard Dillon, MD  vitamin D, CHOLECALCIFEROL, 400 UNITS tablet Take 400 Units by mouth daily.      Historical Provider, MD   BP 114/62  Pulse 75  Temp(Src) 98.4 F (36.9 C)  Resp 18  Wt 131 lb (59.421 kg)  SpO2 99% Physical Exam  Constitutional: She is oriented to person, place, and time. She appears well-developed and well-nourished.  HENT:  Head: Normocephalic and atraumatic.  Eyes: Pupils are equal, round, and reactive to light.  Neck: Normal range of motion. Neck supple.  Cardiovascular: Normal rate, regular rhythm and normal  heart sounds.   Pulmonary/Chest: Effort normal and breath sounds normal. No respiratory distress. She has no wheezes. She has no rales. She exhibits no tenderness.  Abdominal: Soft. Bowel sounds are normal. There is no tenderness. There is no rebound and no guarding.  Musculoskeletal: Normal range of motion. She exhibits no edema.  Lymphadenopathy:    She has no cervical adenopathy.  Neurological: She is alert and oriented to person, place, and time.  Skin: Skin is warm and dry. Rash noted.  Diffuse slightly raised macular rash. It is mostly blanching. There's no vesicles. It  spares the palms and soles. There is no purpura. There's also a 1 cm erythematous area to her left upper arm with a small blister in the center  Psychiatric: She has a normal mood and affect.    ED Course  Procedures (including critical care time) Labs Review Labs Reviewed - No data to display  Imaging Review No results found.   EKG Interpretation None      MDM   Final diagnoses:  Allergic reaction, initial encounter   Patient with a diffuse pruritic rash. I feel it is likely allergic. It's hard to tell if it's a reaction to the antibiotic or if she got stung or bitten by an insect and has a rash related to that. This small wound on her left upper arm looks suspicious for an insect bite. The rash appears the palms and soles and it is not appear to be consistent with [facility] spotted fever. She was given a shot of Decadron in the ED. She was given a prescription for a five-day course of prednisone as well as Atarax for itching. She was encouraged to followup with her primary care physician within 2-3 days if her symptoms are not improving or return here as needed for any worsening symptoms    Malvin Johns, MD 03/11/14 1244

## 2014-03-13 ENCOUNTER — Encounter (HOSPITAL_COMMUNITY): Payer: Self-pay | Admitting: Emergency Medicine

## 2014-03-13 ENCOUNTER — Emergency Department (HOSPITAL_COMMUNITY)
Admission: EM | Admit: 2014-03-13 | Discharge: 2014-03-13 | Disposition: A | Discharge status: Home / Self Care | Payer: Medicare Other | Attending: Emergency Medicine | Admitting: Emergency Medicine

## 2014-03-13 DIAGNOSIS — M810 Age-related osteoporosis without current pathological fracture: Secondary | ICD-10-CM | POA: Insufficient documentation

## 2014-03-13 DIAGNOSIS — M129 Arthropathy, unspecified: Secondary | ICD-10-CM | POA: Insufficient documentation

## 2014-03-13 DIAGNOSIS — E785 Hyperlipidemia, unspecified: Secondary | ICD-10-CM | POA: Diagnosis not present

## 2014-03-13 DIAGNOSIS — R112 Nausea with vomiting, unspecified: Secondary | ICD-10-CM | POA: Diagnosis not present

## 2014-03-13 DIAGNOSIS — E039 Hypothyroidism, unspecified: Secondary | ICD-10-CM | POA: Insufficient documentation

## 2014-03-13 DIAGNOSIS — IMO0002 Reserved for concepts with insufficient information to code with codable children: Secondary | ICD-10-CM | POA: Diagnosis not present

## 2014-03-13 DIAGNOSIS — Z860100 Personal history of colon polyps, unspecified: Secondary | ICD-10-CM | POA: Insufficient documentation

## 2014-03-13 DIAGNOSIS — R197 Diarrhea, unspecified: Secondary | ICD-10-CM | POA: Insufficient documentation

## 2014-03-13 DIAGNOSIS — R21 Rash and other nonspecific skin eruption: Secondary | ICD-10-CM | POA: Diagnosis present

## 2014-03-13 DIAGNOSIS — K219 Gastro-esophageal reflux disease without esophagitis: Secondary | ICD-10-CM | POA: Diagnosis not present

## 2014-03-13 DIAGNOSIS — Z79899 Other long term (current) drug therapy: Secondary | ICD-10-CM | POA: Diagnosis not present

## 2014-03-13 DIAGNOSIS — Z7982 Long term (current) use of aspirin: Secondary | ICD-10-CM | POA: Diagnosis not present

## 2014-03-13 DIAGNOSIS — H269 Unspecified cataract: Secondary | ICD-10-CM | POA: Diagnosis not present

## 2014-03-13 DIAGNOSIS — Z872 Personal history of diseases of the skin and subcutaneous tissue: Secondary | ICD-10-CM | POA: Diagnosis not present

## 2014-03-13 DIAGNOSIS — Z8673 Personal history of transient ischemic attack (TIA), and cerebral infarction without residual deficits: Secondary | ICD-10-CM | POA: Insufficient documentation

## 2014-03-13 DIAGNOSIS — Z862 Personal history of diseases of the blood and blood-forming organs and certain disorders involving the immune mechanism: Secondary | ICD-10-CM | POA: Diagnosis not present

## 2014-03-13 DIAGNOSIS — I10 Essential (primary) hypertension: Secondary | ICD-10-CM | POA: Insufficient documentation

## 2014-03-13 DIAGNOSIS — Z7902 Long term (current) use of antithrombotics/antiplatelets: Secondary | ICD-10-CM | POA: Diagnosis not present

## 2014-03-13 DIAGNOSIS — Z91040 Latex allergy status: Secondary | ICD-10-CM | POA: Diagnosis not present

## 2014-03-13 NOTE — ED Provider Notes (Signed)
Patient with diffuse rash on face, trunk, extremities onset 2 days ago. Patient had formerly been on clindamycin for dental infection. stopped clindamycin 5 days ago. No fever patient does not feel ill. She's been treated with Solu-Medrol, prednisone and Atarax, rash continues to worsen. Exam patient is not ill-appearing. She's not lightheaded on standing. HEENT exam no facial asymmetry. Oropharynx is normal no mucosal lesion. Bilateral cheeks are reddened. Lungs clear auscultation heart regular in rhythm abdomen nondistended nontender. Extremities without edema. Skin there is diffuse maculopapular rash on trunk and extremities. On involving palms or soles  Orlie Dakin, MD 03/14/14 [phone]

## 2014-03-13 NOTE — ED Notes (Signed)
Declined W/C at D/C and was escorted to lobby by RN. 

## 2014-03-13 NOTE — Discharge Instructions (Signed)
Please read and follow all provided instructions.  Your diagnoses today include:  1. Rash and nonspecific skin eruption    Tests performed today include:  Vital signs. See below for your results today.   Medications prescribed:   None  Take any prescribed medications only as directed.  Home care instructions:  Follow any educational materials contained in this packet. Continue prednisone and continue atarax for itching as needed.   Follow-up instructions: You have an appointment with Dr. Nevada Crane at [facility] tomorrow 03/14/14.    Return instructions:   Please return to the Emergency Department if you experience worsening symptoms.   Return with fever, worsening rash  Please return if you have any other emergent concerns.  Additional Information:  Your vital signs today were: BP 96/55   Pulse 74   Temp(Src) 97.3 F (36.3 C) (Oral)   Resp 18   Ht 5\' 2"  (1.575 m)   Wt 131 lb (59.421 kg)   BMI 23.95 kg/m2   SpO2 98% If your blood pressure (BP) was elevated above 135/85 this visit, please have this repeated by your doctor within one month. --------------

## 2014-03-13 NOTE — ED Notes (Signed)
Pt. Stated, I was taking an antibiotic for some dental surgery  And put on antibiotic and changed to another antibiotic and some pain medication,  I have a red rash all over since Sat.  On Sat I went to [facility] and given cortizone and a shot for itching and allergic reaction.

## 2014-03-13 NOTE — ED Notes (Signed)
Dr. Winfred Leeds at pt bedside

## 2014-03-13 NOTE — ED Provider Notes (Signed)
CSN: [id number]     Arrival date & time 03/13/14  1343 History  This chart was scribed for non-physician practitioner, Alecia Lemming, PA-C working with Orlie Dakin, MD, by Erling Conte, ED Scribe. This patient was seen in room TR07C/TR07C and the patient's care was started at 3:35 PM.    Chief Complaint  Patient presents with  . Allergic Reaction  . Rash  . Pruritis     The history is provided by the patient and the spouse. No language interpreter was used.   HPI Comments: Kathleen Thompson is a 80 y.o. female with a h/o GERD, hyperlipidemia, anemia, TIA, stroke, diverticulosis, cellulitis of hand, osteoporosis and PVD who presents to the Emergency Department complaining of a possible allergic reaction that has been occuring for 2 days. Pt states she has been taking an Clindamycin for dental surgery for 6-7 days before her surgery on Thursday 03/09/14. She states she had some episodic emesis, nausea and diarrhea from the Clindamycin and her last dose was on Wednesday night. Patient presented to [facility] on 03/11/14 for rash which started that morning. She was given some Prednisone and Atarax with mild relief. She is having an associated severe, gradually worsening, itchy, red, rash that is diffused all over her entire body. She denies trying any other new medications. She denies any tick bites. She denies any h/o if allergic reactions like this except with Latex. She denies any shortness of breath, dizziness, wheezing, cough, rhinorrhea, congestion, fever, emesis, facial swelling, mouth lesions, or genital lesions.    Past Medical History  Diagnosis Date  . Internal hemorrhoid   . Diverticulosis   . Gastroparesis   . GERD (gastroesophageal reflux disease)   . Adenomatous colon polyp 02/2003  . Cellulitis and abscess of hand   . Hyperlipidemia   . PVD (peripheral vascular disease)   . Hypothyroidism   . Hypertension   . Iron deficiency anemia   . Esophageal stricture   . Arthritis   . Blood  transfusion   . Cataract   . Osteoporosis   . TIA (transient ischemic attack)   . Stroke     TIA  . PONV (postoperative nausea and vomiting)    Past Surgical History  Procedure Laterality Date  . Appendectomy    . Abdominal hysterectomy    . Cervical fusion    . Shoulder surgery     . Video assisted thoracoscopy (vats)/wedge resection Right 03/14/2013    Procedure: VIDEO ASSISTED THORACOSCOPY (VATS)/WEDGE RESECTION;  Surgeon: Melrose Nakayama, MD;  Location: [place];  Service: Thoracic;  Laterality: Right;   Family History  Problem Relation Age of Onset  . Colon cancer Brother     at age 35  . Colon cancer Sister     at age 24  . Diabetes Mother   . Diabetes Maternal Aunt   . Diabetes Maternal Uncle   . Heart attack Mother   . Heart attack Father   . Heart attack Sister   . Heart attack Brother   . Heart attack Brother   . Heart attack Brother   . Heart attack Brother   . Heart attack Sister   . Asthma Father    History  Substance Use Topics  . Smoking status: Never Smoker   . Smokeless tobacco: Never Used  . Alcohol Use: No   OB History   Grav Para Term Preterm Abortions TAB SAB Ect Mult Living  Review of Systems  Constitutional: Negative for fever.  HENT: Negative for congestion, facial swelling, mouth sores, rhinorrhea and trouble swallowing.   Eyes: Negative for redness.  Respiratory: Negative for shortness of breath, wheezing and stridor.   Cardiovascular: Negative for chest pain.  Gastrointestinal: Negative for nausea and vomiting.  Genitourinary: Negative for genital sores.  Musculoskeletal: Negative for myalgias.  Skin: Positive for rash (severe, itchy, red, diffused all over body). Negative for color change.  Allergic/Immunologic: Positive for environmental allergies (Latex).  Neurological: Negative for dizziness and light-headedness.  Hematological: Negative for adenopathy.  Psychiatric/Behavioral: Negative for confusion.       Allergies  Clindamycin/lincomycin; Rofecoxib; and Latex  Home Medications   Prior to Admission medications   Medication Sig Start Date End Date Taking? Authorizing Provider  aspirin 81 MG tablet Take 81 mg by mouth daily.    Historical Provider, MD  atorvastatin (LIPITOR) 40 MG tablet TAKE 1 TABLET (40 MG TOTAL) BY MOUTH DAILY. 07/31/13   Ricard Dillon, MD  CALCIUM PO Take 1 tablet by mouth 2 (two) times daily.    Historical Provider, MD  carvedilol (COREG) 6.25 MG tablet TAKE 1 TABLET (6.25 MG TOTAL) BY MOUTH 2 (TWO) TIMES DAILY. 11/30/13   Ricard Dillon, MD  clopidogrel (PLAVIX) 75 MG tablet TAKE 1 TABLET (75 MG TOTAL) BY MOUTH DAILY. 07/12/13   Ricard Dillon, MD  Coenzyme Q10 (CO Q-10 PO) Take 1 tablet by mouth daily.    Historical Provider, MD  hydrOXYzine (ATARAX/VISTARIL) 25 MG tablet Take 1 tablet (25 mg total) by mouth every 6 (six) hours. 03/11/14   Malvin Johns, MD  isosorbide mononitrate (IMDUR) 30 MG 24 hr tablet TAKE 1 TABLET BY MOUTH DAILY 08/15/13   Ricard Dillon, MD  levothyroxine (SYNTHROID, LEVOTHROID) 75 MCG tablet TAKE 1 TABLET (75 MCG TOTAL) BY MOUTH DAILY.    Ricard Dillon, MD  losartan-hydrochlorothiazide [facility]) 100-12.5 MG per tablet Take 0.5 tablets by mouth 2 (two) times daily. 06/03/13   Ricard Dillon, MD  magnesium 30 MG tablet Take 30 mg by mouth daily.      Historical Provider, MD  pantoprazole (PROTONIX) 40 MG tablet TAKE 1 TABLET (40 MG TOTAL) BY MOUTH DAILY. TAKE 30-60 MIN BEFORE FIRST MEAL OF THE DAY 02/16/14   Ricard Dillon, MD  predniSONE (DELTASONE) 10 MG tablet Take 2 tablets (20 mg total) by mouth daily. 03/11/14   Malvin Johns, MD  Probiotic Product (ALIGN) 4 MG CAPS Take 1 capsule by mouth daily. 07/14/11   Ladene Artist, MD  traMADol (ULTRAM) 50 MG tablet TAKE 1 TABLET EVERY 6 HOURS AS NEEDED FOR PAIN    Ricard Dillon, MD  vitamin D, CHOLECALCIFEROL, 400 UNITS tablet Take 400 Units by mouth daily.      Historical Provider, MD   Triage  Vitals: BP 97/56  Pulse 73  Temp(Src) 97.3 F (36.3 C) (Oral)  Resp 17  Ht 5\' 2"  (1.575 m)  Wt 131 lb (59.421 kg)  BMI 23.95 kg/m2  SpO2 97%  Physical Exam  Nursing note and vitals reviewed. Constitutional: She appears well-developed and well-nourished. No distress.  HENT:  Head: Normocephalic and atraumatic.  No oropharyngeal involvement.  Eyes: Conjunctivae and EOM are normal. Right eye exhibits no discharge. Left eye exhibits no discharge.  Normal sclera.  Neck: Normal range of motion. Neck supple. No tracheal deviation present.  Cardiovascular: Normal rate, regular rhythm and normal heart sounds.   Pulmonary/Chest: Effort normal and breath sounds  normal. No respiratory distress.  Abdominal: Soft. There is no tenderness.  Musculoskeletal: Normal range of motion.  Neurological: She is alert.  Skin: Skin is warm and dry. Rash noted.  Patients with papules over entire body sparing palms and soles. Papules are confluent on for head and face, lower back. No purpura noted.  Psychiatric: She has a normal mood and affect. Her behavior is normal.    ED Course  Procedures (including critical care time)  DIAGNOSTIC STUDIES: Oxygen Saturation is 97% on RA, normal by my interpretation.    COORDINATION OF CARE: 3:42 PM- Will order vitals re-check. Pt advised of plan for treatment and pt agrees.   Labs Review Labs Reviewed - No data to display  Imaging Review No results found.   EKG Interpretation None      Vital signs reviewed and are as follows: Filed Vitals:   03/13/14 1602  BP: 96/55  Pulse: 74  Temp:   Resp: 18   Patient discussed with and seen by Dr. Cathleen Fears.  Given extensive rash and minimal improvement with steroids, I called Dr. Juel Burrow office (dermatology) and scheduled an appointment for the patient at 80 AM tomorrow. Patient has seen Dr. Nevada Crane in the past.  Patient's blood pressure was noted to be low. She is not lightheaded with standing. She is not  tachycardic. Patient states that her blood pressure medicine sometimes causes her blood pressure to be low. She is asymptomatic from this standpoint and is now afebrile. Do not suspect sepsis.  Patient counseled to return if she develops a fever, involvement of her eyes or mouth, has trouble breathing or shortness of breath. She should not take Percocet or clindamycin. She may take prednisone or hydroxyzine as prescribed previously.  MDM   Final diagnoses:  Rash and nonspecific skin eruption   Rash: Likely drug eruption from previous antibiotics. No signs of anaphylaxis. No mucosal involvement to suggest SJS. Patient appears well, nontoxic. Given unclear etiology, was able to schedule an appointment with dermatology tomorrow for the patient.  No dangerous or life-threatening conditions suspected or identified by history, physical exam, and by work-up. No indications for hospitalization identified.     I personally performed the services described in this documentation, which was scribed in my presence. The recorded information has been reviewed and is accurate.       Carlisle Cater, PA-C 03/13/14 [phone]

## 2014-03-14 NOTE — ED Provider Notes (Signed)
Medical screening examination/treatment/procedure(s) were conducted as a shared visit with non-physician practitioner(s) and myself.  I personally evaluated the patient during the encounter.   EKG Interpretation None       Orlie Dakin, MD 03/14/14 0076

## 2014-04-06 ENCOUNTER — Encounter: Payer: Self-pay | Admitting: Gastroenterology

## 2014-04-24 ENCOUNTER — Ambulatory Visit: Payer: Medicare Other | Admitting: Internal Medicine

## 2014-05-15 ENCOUNTER — Other Ambulatory Visit: Payer: Self-pay | Admitting: Internal Medicine

## 2014-05-17 IMAGING — CR DG CHEST 2V
2 series · 2 of 2 positions shown · non-contrast
Comparison: Prior radiograph from 02/09/2013

CLINICAL DATA: Preop, removal of right lung nodule

CHEST - 2 VIEW

[w chest pa]
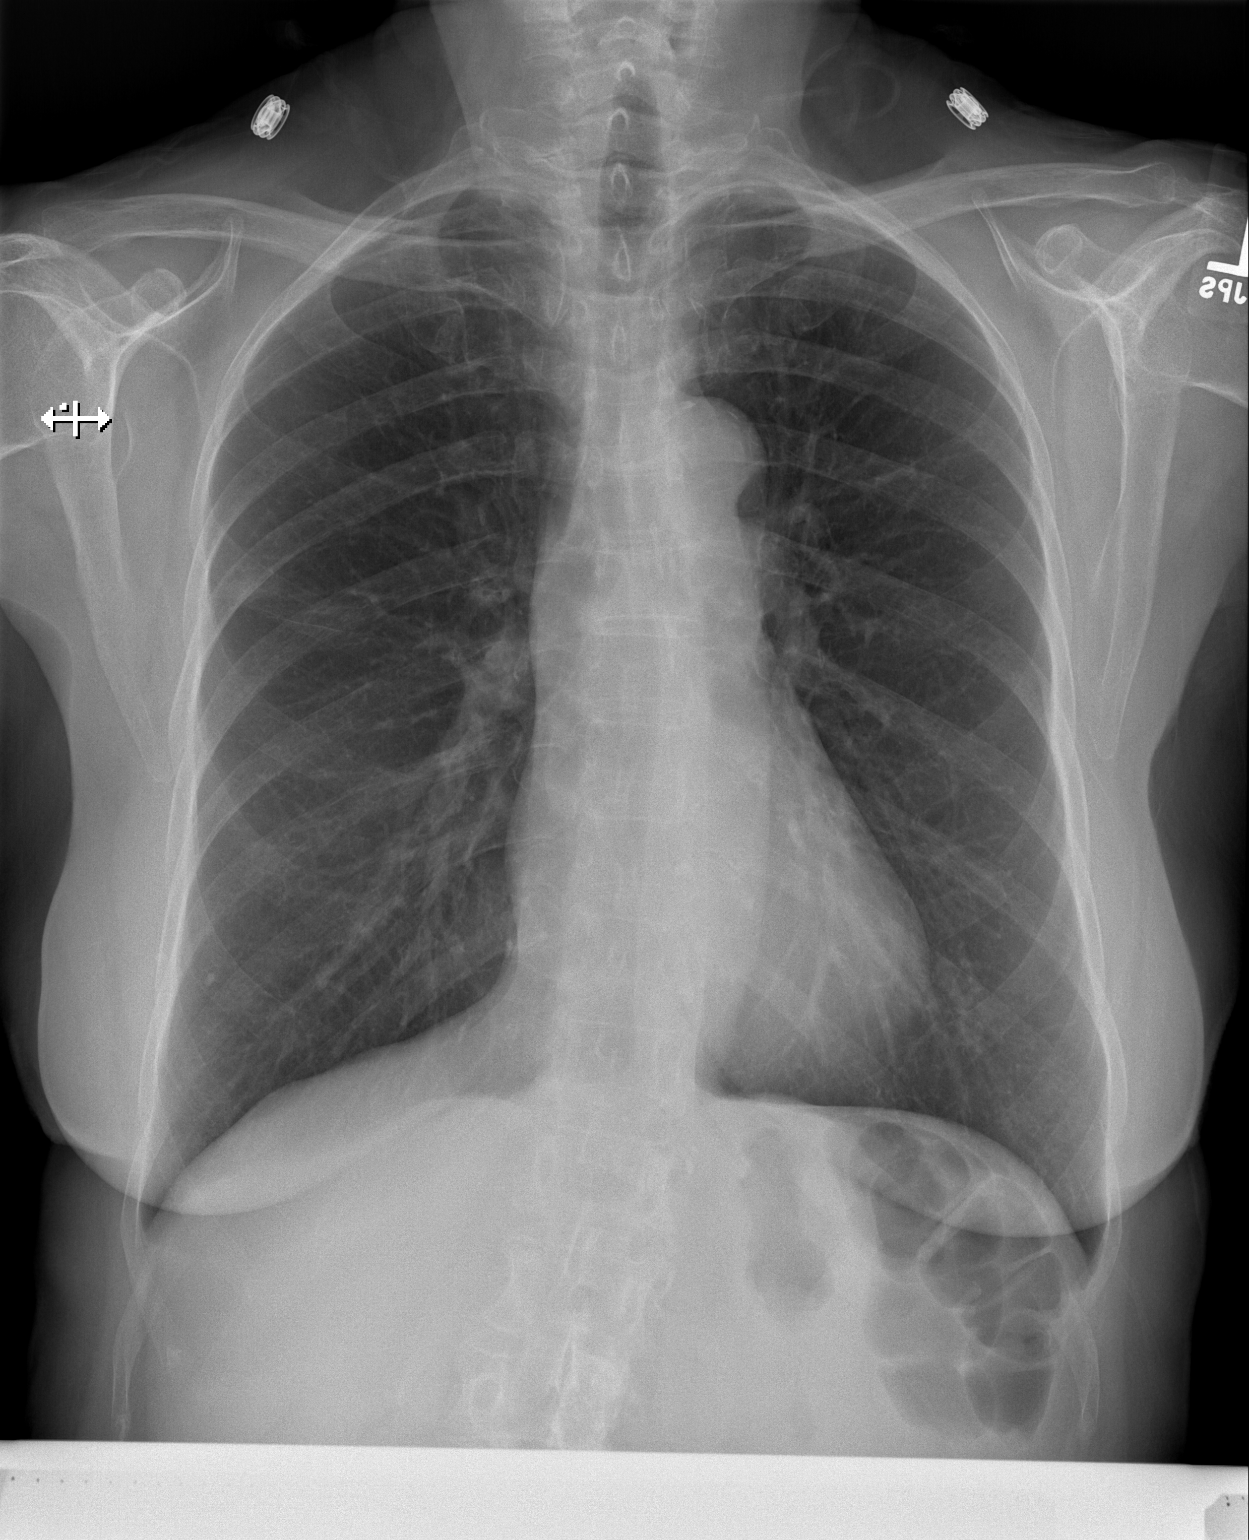

[w chest lat]
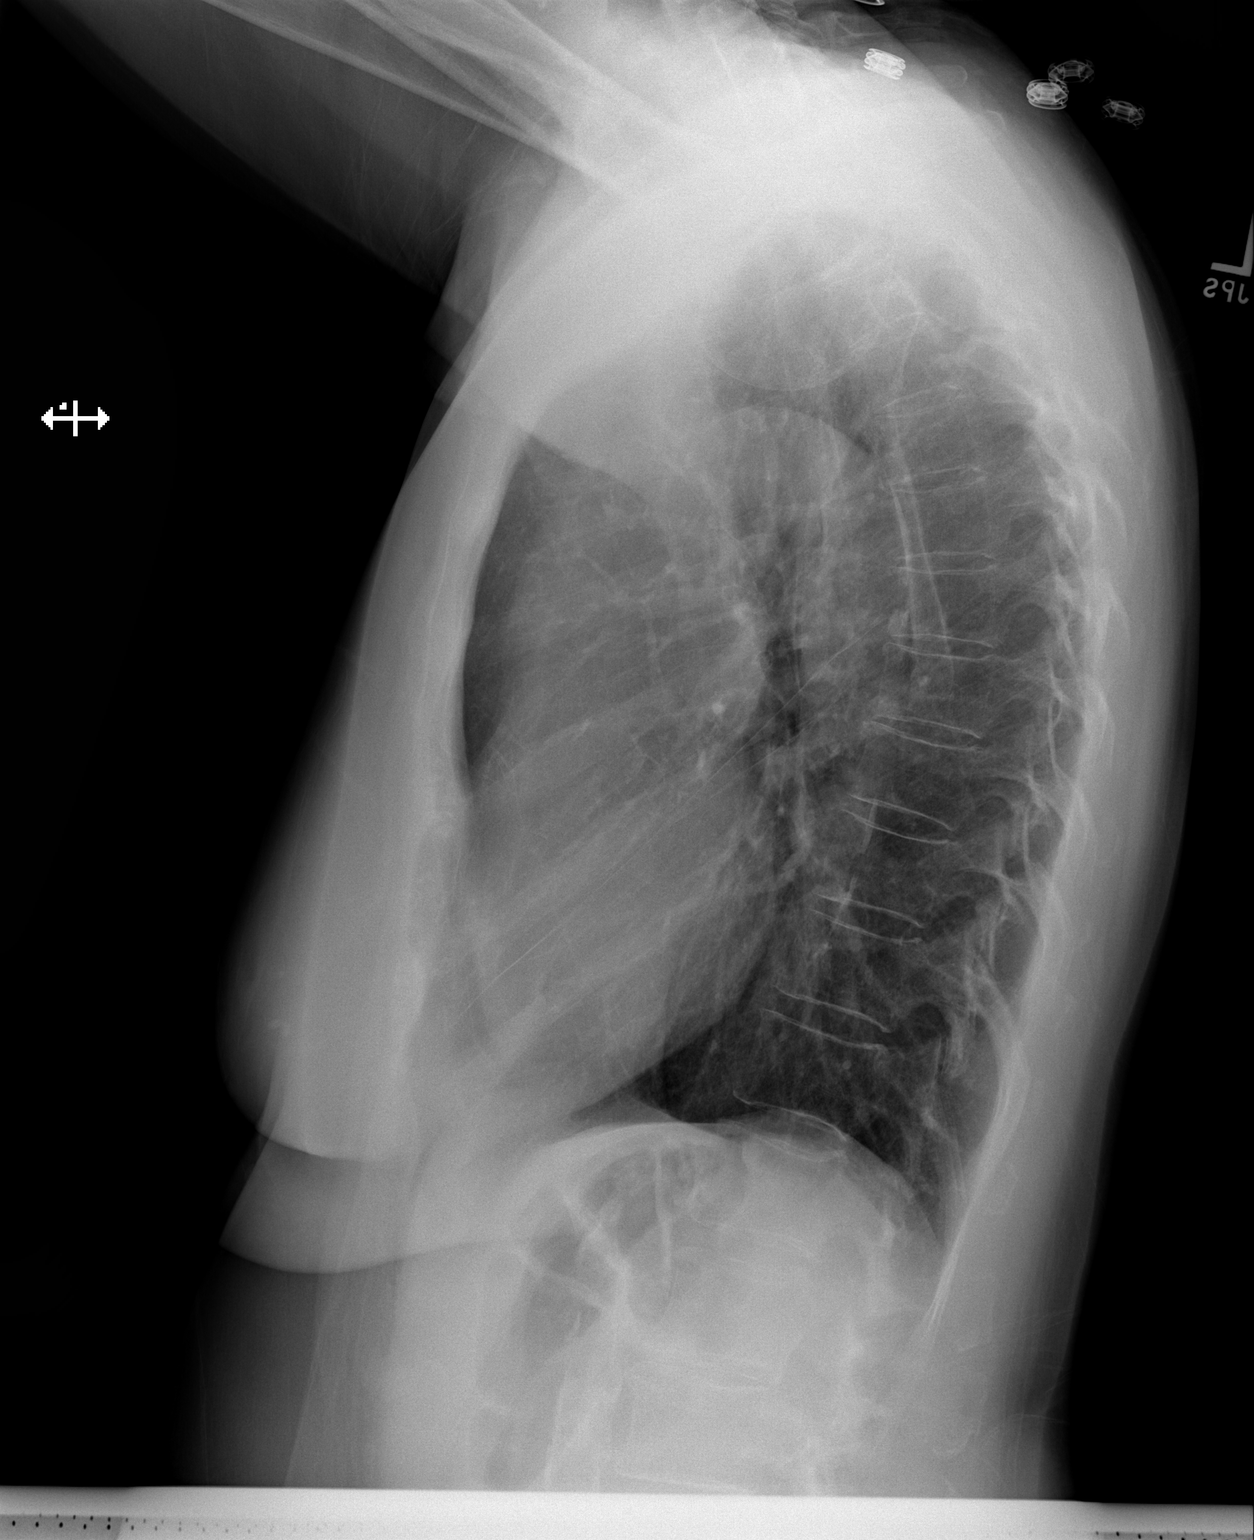

[2 of 2 positions shown; findings below may reference images not displayed]

FINDINGS: The cardiac and mediastinal silhouettes are stable in
size and contour, and remain within normal limits.

The lungs are clear without focal infiltrate, pulmonary edema, or
pleural effusion.  No pneumothorax.  Previously identified right
lung nodule is unchanged.

No osseous abnormality.
IMPRESSION: 1.  No acute cardiopulmonary process.
2.  Unchanged right lung nodule.

## 2014-05-17 IMAGING — CR DG CHEST 1V PORT
1 series · 1 of 1 positions shown · non-contrast
Comparison: Chest x-ray of 03/14/2013, CT chest of 12/19/2012 and
PET CT of 02/17/2013.

CLINICAL DATA: History of wedge resection of right upper lobe
lesion, follow-up

PORTABLE CHEST - 1 VIEW

[AP]
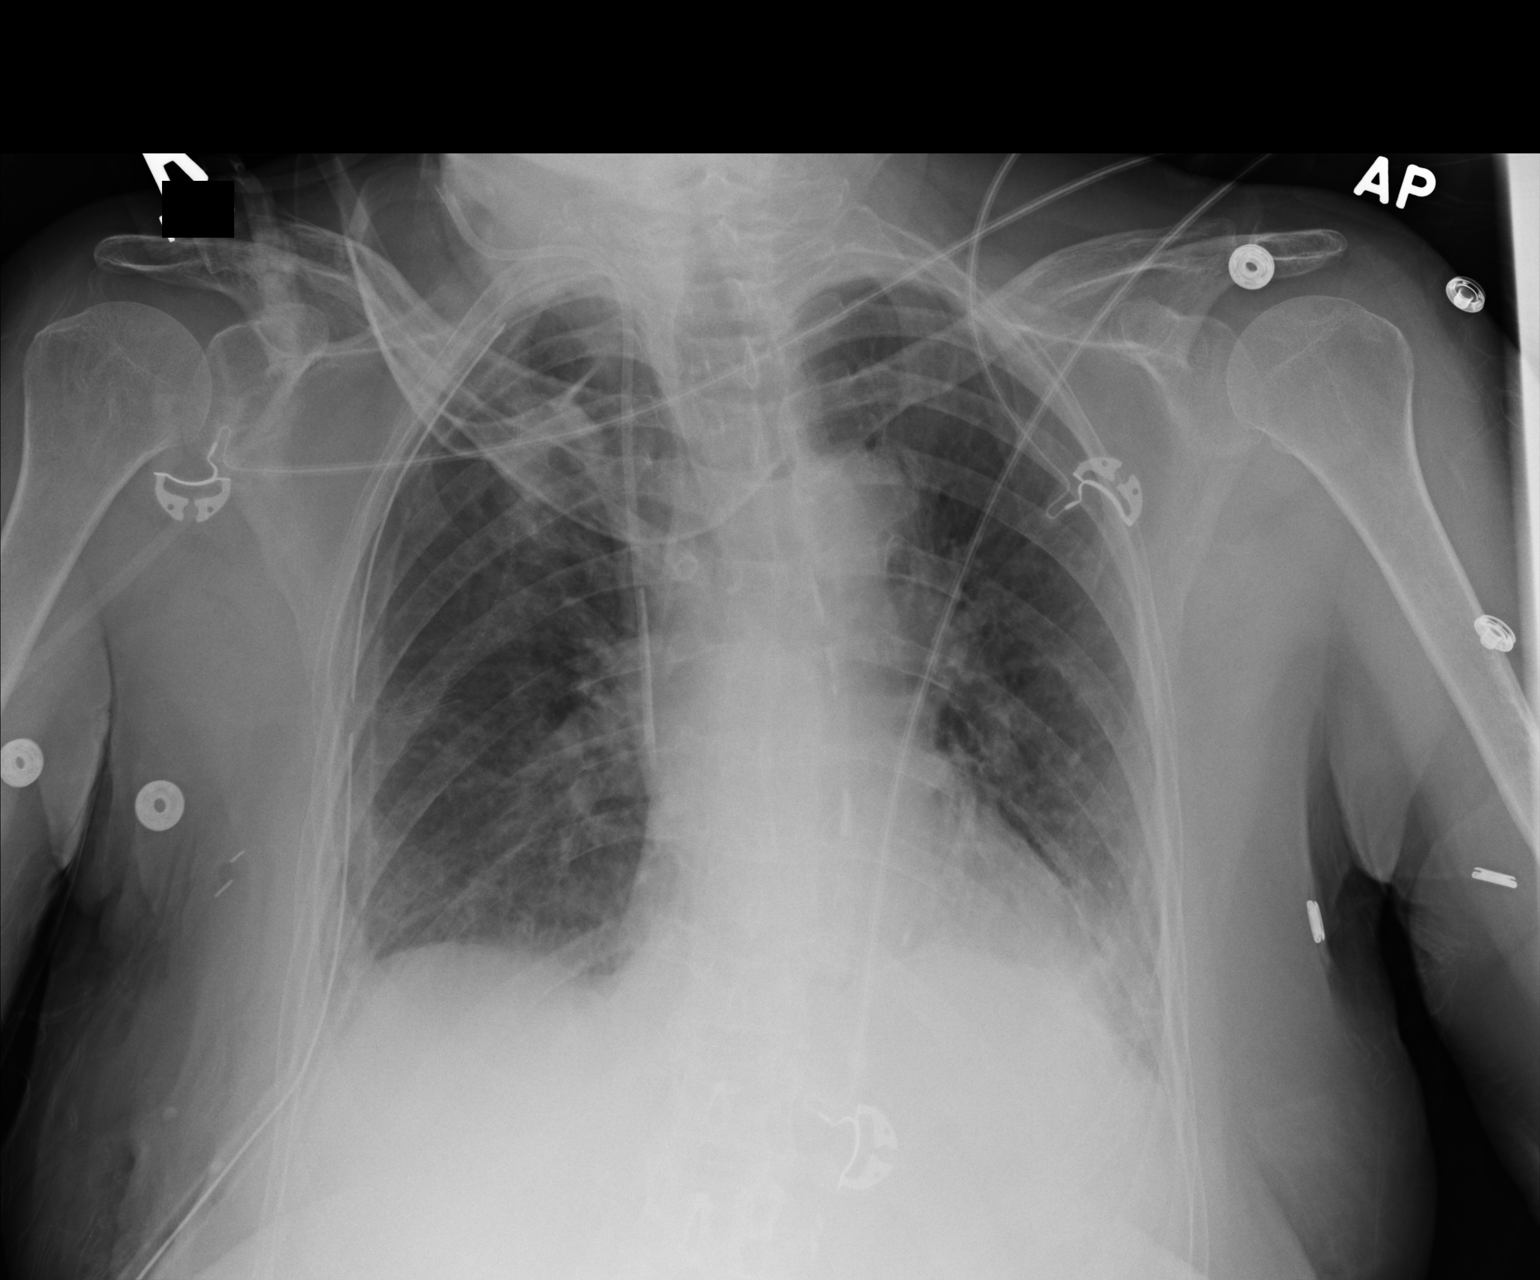

[1 of 1 positions shown; findings below may reference images not displayed]

FINDINGS: The right upper lobe lesion has been resected with volume
loss and bibasilar atelectasis present.  There is opacity in the
right upper lung field at the site of resection, and a right chest
tube is noted.  A right IJ central venous line tip is noted
overlying the expected SVC - RA junction.  Heart size is stable.
IMPRESSION: 1.  Postop resection of right upper lobe lesion with postop changes
in the right upper hemithorax.
2.  Diminished aeration with bibasilar atelectasis.
3.  Right IJ central venous line tip overlies the expected SVC - RA
junction.

## 2014-05-19 IMAGING — CR DG CHEST 2V
2 series · 2 of 2 positions shown · non-contrast
Comparison: 03/15/2013

CLINICAL DATA: Status post right-sided VATS to remove right upper
lobe mass.

CHEST - 2 VIEW

[w chest pa]
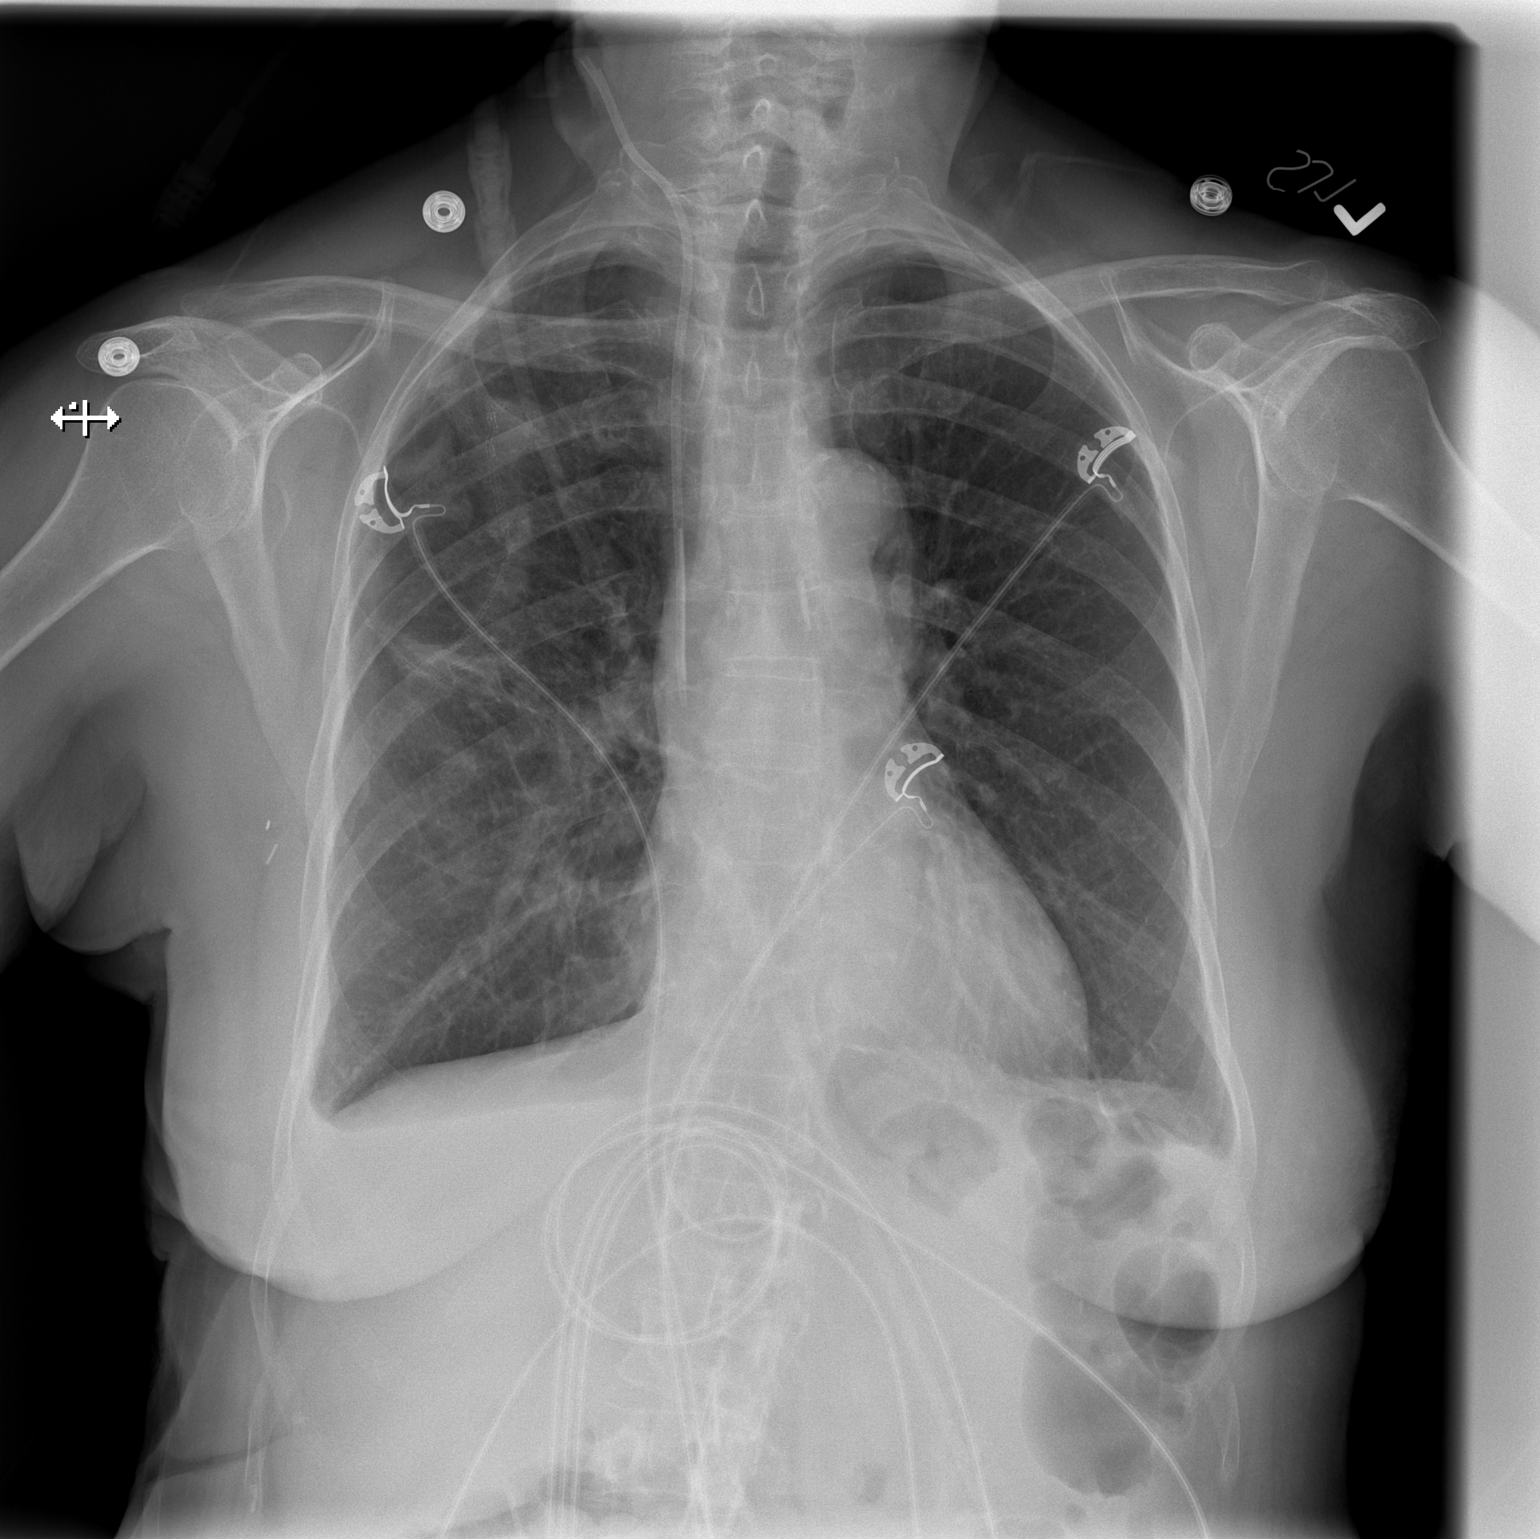

[w chest lat]
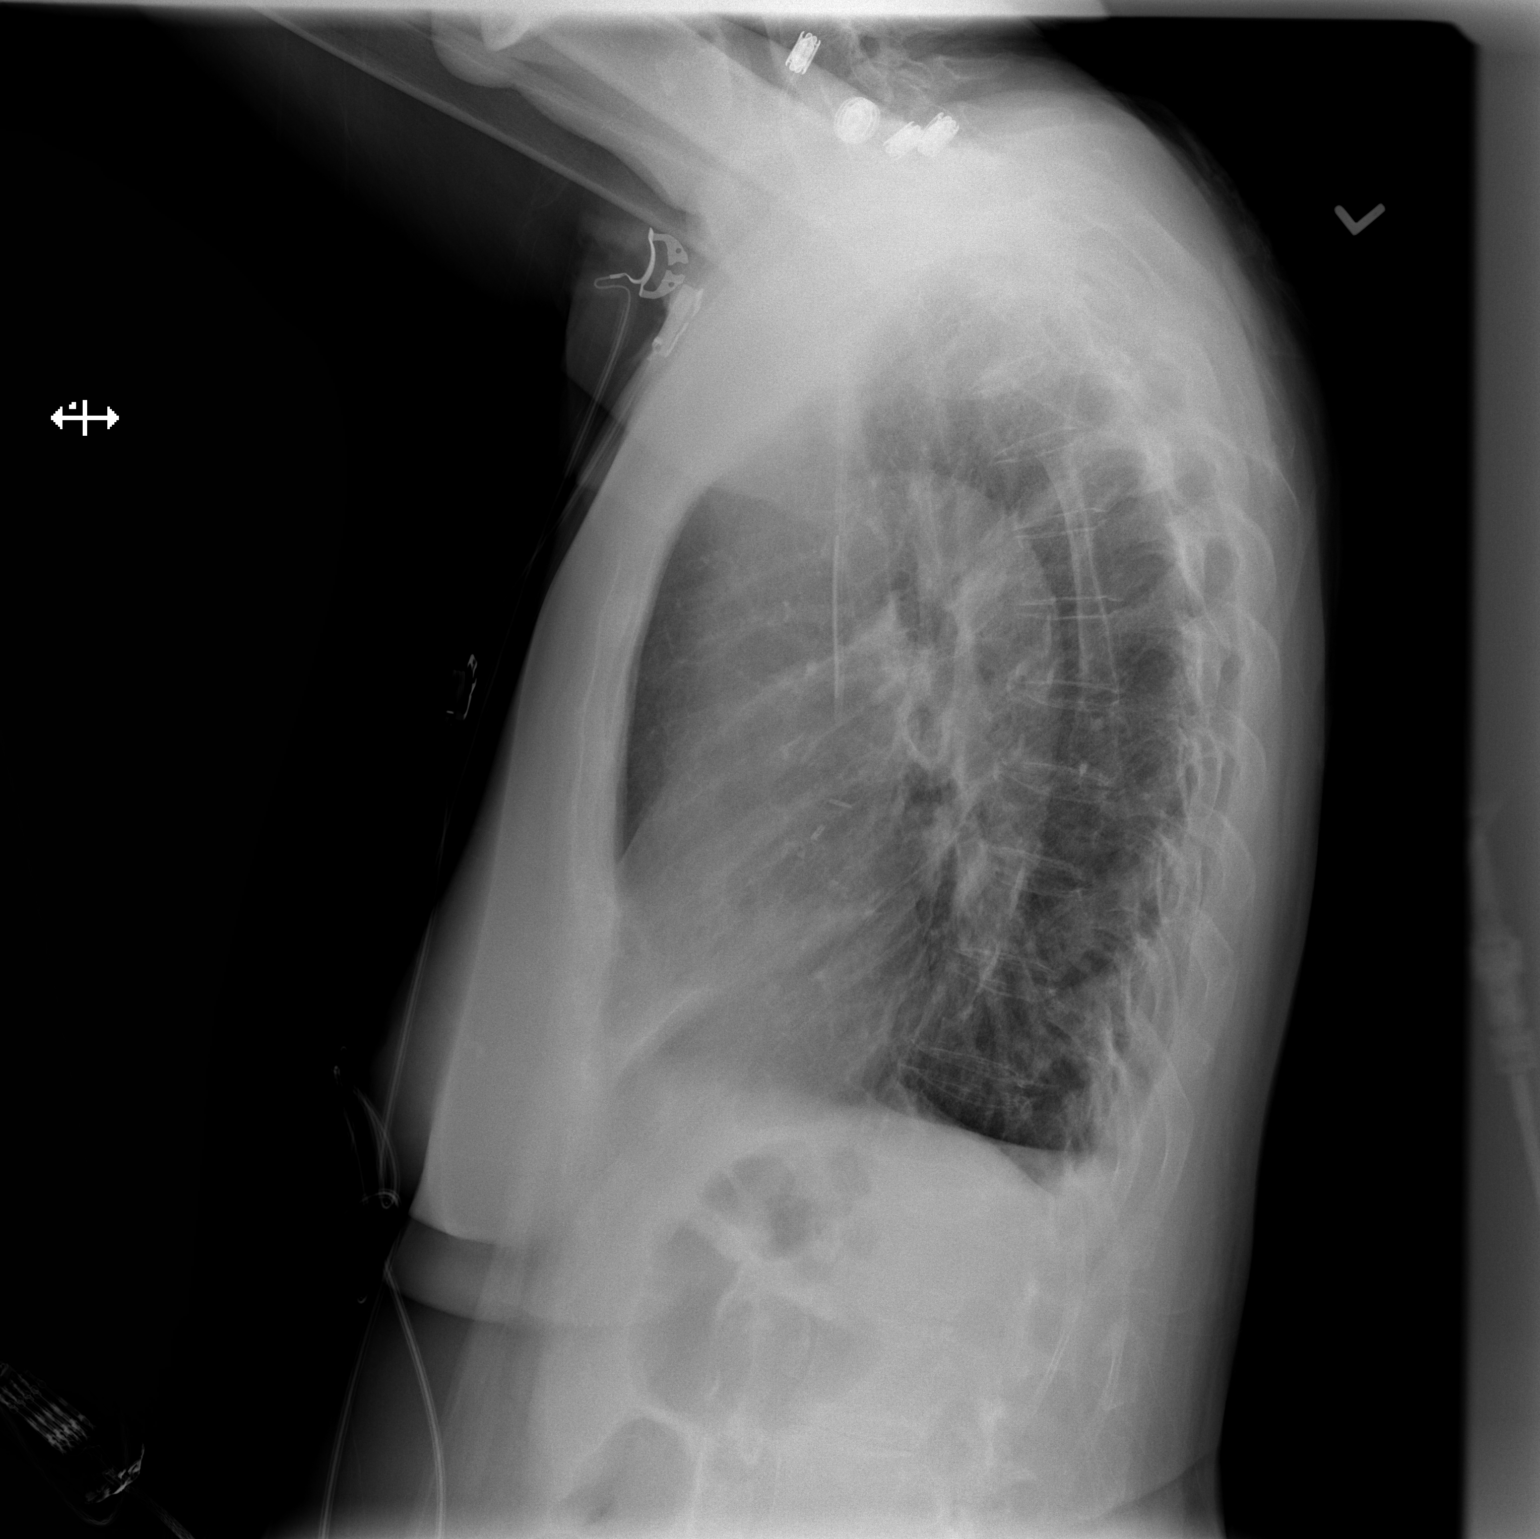

[2 of 2 positions shown; findings below may reference images not displayed]

FINDINGS: Central line positioning stable in the SVC.  Lungs show
no pneumothorax.  Small bilateral pleural effusions are present.
Postoperative changes are present in the right lung after wedge
resection surgery.  No edema, focal consolidation or mediastinal
abnormalities are identified.
IMPRESSION: Small bilateral pleural effusions.  No pneumothorax.

## 2014-05-26 ENCOUNTER — Encounter: Payer: Self-pay | Admitting: Internal Medicine

## 2014-05-26 ENCOUNTER — Ambulatory Visit (INDEPENDENT_AMBULATORY_CARE_PROVIDER_SITE_OTHER): Payer: Medicare Other | Admitting: Internal Medicine

## 2014-05-26 VITALS — BP 127/80 | HR 74 | Temp 98.6°F | Ht 63.0 in | Wt 138.0 lb

## 2014-05-26 DIAGNOSIS — K219 Gastro-esophageal reflux disease without esophagitis: Secondary | ICD-10-CM

## 2014-05-26 DIAGNOSIS — I2583 Coronary atherosclerosis due to lipid rich plaque: Secondary | ICD-10-CM

## 2014-05-26 DIAGNOSIS — E039 Hypothyroidism, unspecified: Secondary | ICD-10-CM

## 2014-05-26 DIAGNOSIS — I2510 Atherosclerotic heart disease of native coronary artery without angina pectoris: Secondary | ICD-10-CM

## 2014-05-26 DIAGNOSIS — I10 Essential (primary) hypertension: Secondary | ICD-10-CM

## 2014-05-26 DIAGNOSIS — E785 Hyperlipidemia, unspecified: Secondary | ICD-10-CM

## 2014-05-26 MED ORDER — TRAMADOL HCL 50 MG PO TABS: 50.0000 mg | ORAL_TABLET | Freq: Two times a day (BID) | ORAL | Status: DC | PRN

## 2014-05-26 NOTE — Assessment & Plan Note (Addendum)
Controlled on coreg, Hyzaar. Check a BMP

## 2014-05-26 NOTE — Assessment & Plan Note (Signed)
Well-controlled on Lipitor, check FLP, previous LFTs normal

## 2014-05-26 NOTE — Progress Notes (Signed)
Pre visit review using our clinic review tool, if applicable. No additional management support is needed unless otherwise documented below in the visit note. 

## 2014-05-26 NOTE — Patient Instructions (Signed)
Stop by the front desk and schedule labs to be done within few days (fasting)  Please come back to the office in 4 months  for a routine check up , no  fasting

## 2014-05-26 NOTE — Assessment & Plan Note (Addendum)
Cardiac cath 12-2012 :  1. Single vessel obstructive CAD. The RCA is small in diameter at 2 mm.  2. Normal LV function.  Was rec to cont imdur, f/u w/ cards prn. Plan: Continue present care, controlling cardiovascular risk factors

## 2014-05-26 NOTE — Assessment & Plan Note (Signed)
Well controlled on PPIs

## 2014-05-26 NOTE — Progress Notes (Signed)
Subjective:    Patient ID: Kathleen Thompson, female    DOB: 24-Apr-1934, 80 y.o.   MRN: [id number]  DOS:  05/26/2014 Type of visit - description : new pt, transferring fronm Dr Arnoldo Morale Interval history: DJD, requiring tramadol  one or twice a day to control her  pain, needs a refill CAD, chart reviewed, currently doing well. See assessment and plan High cholesterol, good medication compliance  Hypertension good medication compliance, ambulatory BPs very good. Hypothyroidism, taking Synthroid regularly, due for a TSH   ROS Denies chest pain or difficulty breathing, occasional ankle edema No nausea, vomiting, diarrhea No dysuria or gross hematuria GERD symptoms well controlled on PPIs Already had a flu shot  Past Medical History  Diagnosis Date  . Internal hemorrhoid   . Diverticulosis   . Gastroparesis   . GERD (gastroesophageal reflux disease)   . Adenomatous colon polyp 02/2003  . Hyperlipidemia   . PVD (peripheral vascular disease)   . Hypothyroidism   . Hypertension   . Iron deficiency anemia   . Esophageal stricture   . Arthritis   . Blood transfusion   . Cataract   . Osteoporosis   . TIA (transient ischemic attack)   . Stroke     TIA  . PONV (postoperative nausea and vomiting)     Past Surgical History  Procedure Laterality Date  . Appendectomy    . Abdominal hysterectomy    . Cervical fusion    . Shoulder surgery     . Video assisted thoracoscopy (vats)/wedge resection Right 03/14/2013    Procedure: VIDEO ASSISTED THORACOSCOPY (VATS)/WEDGE RESECTION;  Surgeon: Melrose Nakayama, MD;  Location: [place];  Service: Thoracic;  Laterality: Right;    History   Social History  . Marital Status: Married    Spouse Name: N/A    Number of Children: 2  . Years of Education: N/A   Occupational History  . Retired-[employer]     Social History Main Topics  . Smoking status: Never Smoker   . Smokeless tobacco: Never Used  . Alcohol Use: No  . Drug Use: No  .  Sexual Activity: Yes   Other Topics Concern  . Not on file   Social History Narrative   Household-- pt and wife   2 boys , one in [place]         Medication List       This list is accurate as of: 05/26/14 11:59 PM.  Always use your most recent med list.               ALIGN 4 MG Caps  Take 1 capsule by mouth daily.     aspirin 81 MG tablet  Take 81 mg by mouth daily.     atorvastatin 40 MG tablet  Commonly known as:  LIPITOR  TAKE 1 TABLET (40 MG TOTAL) BY MOUTH DAILY.     CALCIUM PO  Take 1 tablet by mouth 2 (two) times daily.     carvedilol 6.25 MG tablet  Commonly known as:  COREG  TAKE 1 TABLET (6.25 MG TOTAL) BY MOUTH 2 (TWO) TIMES DAILY.     clopidogrel 75 MG tablet  Commonly known as:  PLAVIX  TAKE 1 TABLET (75 MG TOTAL) BY MOUTH DAILY.     CO Q-10 PO  Take 1 tablet by mouth daily.     hydrOXYzine 25 MG tablet  Commonly known as:  ATARAX/VISTARIL  Take 1 tablet (25 mg total) by mouth every 6 (  six) hours.     isosorbide mononitrate 30 MG 24 hr tablet  Commonly known as:  IMDUR  TAKE 1 TABLET BY MOUTH DAILY     levothyroxine 75 MCG tablet  Commonly known as:  SYNTHROID, LEVOTHROID  TAKE 1 TABLET (75 MCG TOTAL) BY MOUTH DAILY.     losartan-hydrochlorothiazide 100-12.5 MG per tablet  Commonly known as:  HYZAAR  Take 0.5 tablets by mouth 2 (two) times daily.     magnesium 30 MG tablet  Take 30 mg by mouth daily.     pantoprazole 40 MG tablet  Commonly known as:  PROTONIX  TAKE 1 TABLET (40 MG TOTAL) BY MOUTH DAILY. TAKE 30-60 MIN BEFORE FIRST MEAL OF THE DAY     traMADol 50 MG tablet  Commonly known as:  ULTRAM  Take 1 tablet (50 mg total) by mouth 2 (two) times daily as needed.     vitamin D (CHOLECALCIFEROL) 400 UNITS tablet  Take 400 Units by mouth daily.           Objective:   Physical Exam BP 127/80  Pulse 74  Temp(Src) 98.6 F (37 C) (Oral)  Ht 5\' 3"  (1.6 m)  Wt 138 lb (62.596 kg)  BMI 24.45 kg/m2  SpO2 97% General -- alert,  well-developed, NAD.  HEENT-- Not pale.   Lungs -- normal respiratory effort, no intercostal retractions, no accessory muscle use, and normal breath sounds.  Heart-- normal rate, regular rhythm, no murmur.  Abdomen-- Not distended, good bowel sounds,soft, non-tender. Extremities-- no pretibial edema bilaterally  Neurologic--  alert & oriented X3. Speech normal, gait appropriate for age, strength symmetric and appropriate for age.  Psych-- Cognition and judgment appear intact. Cooperative with normal attention span and concentration. No anxious or depressed appearing.        Assessment & Plan:

## 2014-05-26 NOTE — Assessment & Plan Note (Signed)
Due for TSH

## 2014-05-31 ENCOUNTER — Other Ambulatory Visit: Payer: Medicare Other

## 2014-05-31 LAB — CBC WITH DIFFERENTIAL/PLATELET
BASOS ABS: 0 10*3/uL (ref 0.0–0.1)
Basophils Relative: 0.8 % (ref 0.0–3.0)
Eosinophils Absolute: 0.1 10*3/uL (ref 0.0–0.7)
Eosinophils Relative: 1.8 % (ref 0.0–5.0)
HCT: 39.2 % (ref 36.0–46.0)
HEMOGLOBIN: 13.1 g/dL (ref 12.0–15.0)
LYMPHS PCT: 50.4 % — AB (ref 12.0–46.0)
Lymphs Abs: 2.2 10*3/uL (ref 0.7–4.0)
MCHC: 33.5 g/dL (ref 30.0–36.0)
MCV: 94.6 fl (ref 78.0–100.0)
MONOS PCT: 8.3 % (ref 3.0–12.0)
Monocytes Absolute: 0.4 10*3/uL (ref 0.1–1.0)
Neutro Abs: 1.7 10*3/uL (ref 1.4–7.7)
Neutrophils Relative %: 38.7 % — ABNORMAL LOW (ref 43.0–77.0)
PLATELETS: 252 10*3/uL (ref 150.0–400.0)
RBC: 4.15 Mil/uL (ref 3.87–5.11)
RDW: 15.4 % (ref 11.5–15.5)
WBC: 4.4 10*3/uL (ref 4.0–10.5)

## 2014-05-31 LAB — TSH: TSH: 0.29 u[IU]/mL — AB (ref 0.35–4.50)

## 2014-05-31 NOTE — Addendum Note (Signed)
Addended by: Harl Bowie on: 05/31/2014 08:02 AM   Modules accepted: Orders

## 2014-06-01 LAB — LIPID PANEL
CHOLESTEROL: 167 mg/dL (ref 0–200)
HDL: 73.1 mg/dL (ref >39.00)
LDL Cholesterol: 84 mg/dL (ref 0–99)
NonHDL: 93.9
TRIGLYCERIDES: 51 mg/dL (ref 0.0–149.0)
Total CHOL/HDL Ratio: 2
VLDL: 10.2 mg/dL (ref 0.0–40.0)

## 2014-06-01 LAB — BASIC METABOLIC PANEL
BUN: 11 mg/dL (ref 6–23)
CO2: 31 mEq/L (ref 19–32)
CREATININE: 1.1 mg/dL (ref 0.4–1.2)
Calcium: 9.5 mg/dL (ref 8.4–10.5)
Chloride: 100 mEq/L (ref 96–112)
GFR: 52.68 mL/min — AB (ref >60.00)
Glucose, Bld: 84 mg/dL (ref 70–99)
POTASSIUM: 5 meq/L (ref 3.5–5.1)
Sodium: 138 mEq/L (ref 135–145)

## 2014-06-06 ENCOUNTER — Telehealth: Payer: Self-pay | Admitting: Internal Medicine

## 2014-06-06 MED ORDER — LEVOTHYROXINE SODIUM 50 MCG PO TABS: 50.0000 ug | ORAL_TABLET | Freq: Every day | ORAL | Status: DC

## 2014-06-06 NOTE — Addendum Note (Signed)
Addended by: Wilfrid Lund on: 06/06/2014 02:14 PM   Modules accepted: Orders, Medications

## 2014-06-06 NOTE — Telephone Encounter (Signed)
Caller name: Elon Jester Relation to pt: Call back [address]:  Reason for call:  Pt returning call regarding labs

## 2014-06-06 NOTE — Telephone Encounter (Signed)
Spoke with Pt, please see lab notes.  

## 2014-06-09 ENCOUNTER — Other Ambulatory Visit: Payer: Medicare Other

## 2014-06-23 ENCOUNTER — Other Ambulatory Visit: Payer: Self-pay | Admitting: Internal Medicine

## 2014-07-13 ENCOUNTER — Other Ambulatory Visit: Payer: Self-pay

## 2014-07-13 ENCOUNTER — Telehealth: Payer: Self-pay | Admitting: Internal Medicine

## 2014-07-13 MED ORDER — ISOSORBIDE MONONITRATE ER 30 MG PO TB24: 30.0000 mg | ORAL_TABLET | Freq: Every day | ORAL | Status: DC

## 2014-07-13 NOTE — Telephone Encounter (Signed)
Caller name:  [place] Relation to pt: self Call back number: [phone] Pharmacy:  Reason for call:   Patient is requesting that all medication be refilled for 90 days next time.

## 2014-07-14 ENCOUNTER — Other Ambulatory Visit: Payer: Self-pay

## 2014-07-14 ENCOUNTER — Other Ambulatory Visit (INDEPENDENT_AMBULATORY_CARE_PROVIDER_SITE_OTHER): Payer: Medicare Other

## 2014-07-14 DIAGNOSIS — E039 Hypothyroidism, unspecified: Secondary | ICD-10-CM

## 2014-07-14 NOTE — Telephone Encounter (Signed)
Noted  

## 2014-07-15 LAB — TSH: TSH: 1.82 u[IU]/mL (ref 0.35–4.50)

## 2014-07-20 ENCOUNTER — Encounter (HOSPITAL_COMMUNITY): Payer: Self-pay | Admitting: Cardiology

## 2014-07-31 ENCOUNTER — Other Ambulatory Visit: Payer: Self-pay | Admitting: Internal Medicine

## 2014-08-16 ENCOUNTER — Other Ambulatory Visit: Payer: Self-pay | Admitting: Internal Medicine

## 2014-08-21 ENCOUNTER — Telehealth: Payer: Self-pay | Admitting: Internal Medicine

## 2014-08-21 NOTE — Telephone Encounter (Signed)
Caller name: [place]  Relation to pt: other  Call back number: [phone]   Reason for call:  Pharmacy states the only manufacture is [employer]oz for levothyroxine (SYNTHROID, LEVOTHROID) 50 MCG tablet  Pharmacy would like to know if this is ok.

## 2014-08-21 NOTE — Telephone Encounter (Signed)
As long as she takes levothyroxine 50 mcg daily is okay with me, if they patient requests branded only medication -- let me know

## 2014-08-21 NOTE — Telephone Encounter (Signed)
Please advise 

## 2014-08-22 NOTE — Telephone Encounter (Signed)
LMOM for [facility] informing them Sandoz manufacturer is fine with Dr. Larose Kells as long as it is Levothyroxine 50 mcg daily.

## 2014-09-08 ENCOUNTER — Telehealth: Payer: Self-pay | Admitting: Internal Medicine

## 2014-09-08 MED ORDER — TRAMADOL HCL 50 MG PO TABS: 50.0000 mg | ORAL_TABLET | Freq: Two times a day (BID) | ORAL | Status: DC | PRN

## 2014-09-08 NOTE — Telephone Encounter (Signed)
Faxed to [facility] pharmacy.

## 2014-09-08 NOTE — Telephone Encounter (Signed)
Caller name: Meya Relation to pt: self Call back number: [phone] Pharmacy:  [facility] [address]  Reason for call:   Requesting tramadol refill

## 2014-09-08 NOTE — Telephone Encounter (Signed)
Pt is requesting refill on Tramadol.   Last OV: 05/26/2014 Last Fill: 05/26/2014 # [id number] UDS: None  Please advise.

## 2014-09-08 NOTE — Telephone Encounter (Signed)
Prescription printed (has a OV next month)

## 2014-09-25 ENCOUNTER — Ambulatory Visit: Payer: Medicare Other | Admitting: Internal Medicine

## 2014-10-05 ENCOUNTER — Other Ambulatory Visit: Payer: Self-pay

## 2014-10-05 MED ORDER — LEVOTHYROXINE SODIUM 50 MCG PO TABS
50.0000 ug | ORAL_TABLET | Freq: Every day | ORAL | Status: DC
Start: 2014-10-05 — End: 2015-04-11

## 2014-11-23 ENCOUNTER — Telehealth: Payer: Self-pay | Admitting: Internal Medicine

## 2014-11-23 NOTE — Telephone Encounter (Signed)
Pre Visit Letter Sent °

## 2014-12-11 ENCOUNTER — Telehealth: Payer: Self-pay | Admitting: *Deleted

## 2014-12-11 ENCOUNTER — Encounter: Payer: Self-pay | Admitting: *Deleted

## 2014-12-11 ENCOUNTER — Other Ambulatory Visit: Payer: Self-pay

## 2014-12-11 MED ORDER — ISOSORBIDE MONONITRATE ER 30 MG PO TB24: 30.0000 mg | ORAL_TABLET | Freq: Every day | ORAL | Status: DC

## 2014-12-11 NOTE — Telephone Encounter (Signed)
Pre-Visit Call completed with patient and chart updated.   Pre-Visit Info documented in Specialty Comments under SnapShot.    

## 2014-12-12 ENCOUNTER — Ambulatory Visit (INDEPENDENT_AMBULATORY_CARE_PROVIDER_SITE_OTHER): Payer: Medicare Other | Admitting: Internal Medicine

## 2014-12-12 ENCOUNTER — Ambulatory Visit (HOSPITAL_BASED_OUTPATIENT_CLINIC_OR_DEPARTMENT_OTHER)
Admission: RE | Admit: 2014-12-12 | Discharge: 2014-12-12 | Disposition: A | Discharge status: Home / Self Care | Payer: Medicare Other | Source: Ambulatory Visit | Attending: Internal Medicine | Admitting: Internal Medicine

## 2014-12-12 ENCOUNTER — Encounter: Payer: Self-pay | Admitting: Internal Medicine

## 2014-12-12 VITALS — BP 122/68 | HR 71 | Temp 97.5°F | Ht 63.0 in | Wt 135.1 lb

## 2014-12-12 DIAGNOSIS — I10 Essential (primary) hypertension: Secondary | ICD-10-CM

## 2014-12-12 DIAGNOSIS — I2583 Coronary atherosclerosis due to lipid rich plaque: Secondary | ICD-10-CM

## 2014-12-12 DIAGNOSIS — Z0000 Encounter for general adult medical examination without abnormal findings: Secondary | ICD-10-CM

## 2014-12-12 DIAGNOSIS — R911 Solitary pulmonary nodule: Secondary | ICD-10-CM | POA: Insufficient documentation

## 2014-12-12 DIAGNOSIS — E785 Hyperlipidemia, unspecified: Secondary | ICD-10-CM

## 2014-12-12 DIAGNOSIS — E039 Hypothyroidism, unspecified: Secondary | ICD-10-CM | POA: Diagnosis not present

## 2014-12-12 DIAGNOSIS — I2510 Atherosclerotic heart disease of native coronary artery without angina pectoris: Secondary | ICD-10-CM

## 2014-12-12 DIAGNOSIS — Z1239 Encounter for other screening for malignant neoplasm of breast: Secondary | ICD-10-CM

## 2014-12-12 NOTE — Progress Notes (Deleted)
Here for Medicare AWV:  1. Risk factors based on Past M, S, F history: reviewed 2. Physical Activities: takes walks, very active 3. Depression/mood: neg screening  4. Hearing:  No problems noted or reported  5. ADL's: independent, drives  6. Fall Risk: no recent falls, prevention discussed , see AVS 7. home Safety: does feel safe at home  8. Height, weight, & visual acuity: see VS, due to see the  eye doctor , plans to schedule 9. Counseling: provided 10. Labs ordered based on risk factors: if needed  11. Referral Coordination: if needed 12. Care Plan, see assessment and plan , written personalized plan provided , see AVS 13. Cognitive Assessment: motor skills and cognition appropriate for age 81. Care team updated, see Snap Shot  15. End-of-life care  In addition, today we discussed the following:   ------------------------------------ Allergies verified: UTD   Immunization Status: Flu vaccine-- 05/08/14  Tdap-- 02/18/10  PNA-- 02/18/10 (23),  prevnar 10/11/13    Shingles-- 02/26/11   Female care -- last visit w/  Gyn ~ 4-5 years S/p hysterectomy for benign reasons, no h/o abnormal PAPs, last PAP  >5 years ago  MMG-- >5 years ago per patient , no FH, no abnormal MMGs before   Bone Density-- > 5 years per patient   CCS-- 08/21/11 with Lucio Edward, MD - diverticulosis, recommended f/u 5 years   Care Teams Updated: Lia Foyer, NP- GYN  ED/Hospital/Urgent Care Visits: none recent   To Discuss with Provider: patient would like a Tramadol refill. She states she had a lung nodule removed 03/2013 and is wondering if a repeat CXR would be indicated to follow-up.

## 2014-12-12 NOTE — Patient Instructions (Addendum)
Stop by the first floor and get the XR   Please schedule labs to be done within few days (fasting)  Come back to the office in 6 months   for a routine check up    Check the  blood pressure 2 or 3 times a  Week   Be sure your blood pressure is between 110/65 and  145/85.  if it is consistently higher or lower, let me know        Fall Prevention and Home Safety Falls cause injuries and can affect all age groups. It is possible to use preventive measures to significantly decrease the likelihood of falls. There are many simple measures which can make your home safer and prevent falls. OUTDOORS  Repair cracks and edges of walkways and driveways.  Remove high doorway thresholds.  Trim shrubbery on the main path into your home.  Have good outside lighting.  Clear walkways of tools, rocks, debris, and clutter.  Check that handrails are not broken and are securely fastened. Both sides of steps should have handrails.  Have leaves, snow, and ice cleared regularly.  Use sand or salt on walkways during winter months.  In the garage, clean up grease or oil spills. BATHROOM  Install night lights.  Install grab bars by the toilet and in the tub and shower.  Use non-skid mats or decals in the tub or shower.  Place a plastic non-slip stool in the shower to sit on, if needed.  Keep floors dry and clean up all water on the floor immediately.  Remove soap buildup in the tub or shower on a regular basis.  Secure bath mats with non-slip, double-sided rug tape.  Remove throw rugs and tripping hazards from the floors. BEDROOMS  Install night lights.  Make sure a bedside light is easy to reach.  Do not use oversized bedding.  Keep a telephone by your bedside.  Have a firm chair with side arms to use for getting dressed.  Remove throw rugs and tripping hazards from the floor. KITCHEN  Keep handles on pots and pans turned toward the center of the stove. Use back burners when  possible.  Clean up spills quickly and allow time for drying.  Avoid walking on wet floors.  Avoid hot utensils and knives.  Position shelves so they are not too high or low.  Place commonly used objects within easy reach.  If necessary, use a sturdy step stool with a grab bar when reaching.  Keep electrical cables out of the way.  Do not use floor polish or wax that makes floors slippery. If you must use wax, use non-skid floor wax.  Remove throw rugs and tripping hazards from the floor. STAIRWAYS  Never leave objects on stairs.  Place handrails on both sides of stairways and use them. Fix any loose handrails. Make sure handrails on both sides of the stairways are as long as the stairs.  Check carpeting to make sure it is firmly attached along stairs. Make repairs to worn or loose carpet promptly.  Avoid placing throw rugs at the top or bottom of stairways, or properly secure the rug with carpet tape to prevent slippage. Get rid of throw rugs, if possible.  Have an electrician put in a light switch at the top and bottom of the stairs. OTHER FALL PREVENTION TIPS  Wear low-heel or rubber-soled shoes that are supportive and fit well. Wear closed toe shoes.  When using a stepladder, make sure it is fully opened and  both spreaders are firmly locked. Do not climb a closed stepladder.  Add color or contrast paint or tape to grab bars and handrails in your home. Place contrasting color strips on first and last steps.  Learn and use mobility aids as needed. Install an electrical emergency response system.  Turn on lights to avoid dark areas. Replace light bulbs that burn out immediately. Get light switches that glow.  Arrange furniture to create clear pathways. Keep furniture in the same place.  Firmly attach carpet with non-skid or double-sided tape.  Eliminate uneven floor surfaces.  Select a carpet pattern that does not visually hide the edge of steps.  Be aware of all  pets. OTHER HOME SAFETY TIPS  Set the water temperature for 120 F (48.8 C).  Keep emergency numbers on or near the telephone.  Keep smoke detectors on every level of the home and near sleeping areas. Document Released: 07/18/2002 Document Revised: 01/27/2012 Document Reviewed: 10/17/2011 [facility] Patient Information 2015 [place], [place]. This information is not intended to replace advice given to you by your health care provider. Make sure you discuss any questions you have with your health care provider.   Preventive Care for Adults Ages 79 and over  Blood pressure check.** / Every 1 to 2 years.  Lipid and cholesterol check.**/ Every 5 years beginning at age 9.  Lung cancer screening. / Every year if you are aged 48-80 years and have a 30-pack-year history of smoking and currently smoke or have quit within the past 15 years. Yearly screening is stopped once you have quit smoking for at least 15 years or develop a health problem that would prevent you from having lung cancer treatment.  Fecal occult blood test (FOBT) of stool. / Every year beginning at age 4 and continuing until age 49. You may not have to do this test if you get a colonoscopy every 10 years.  Flexible sigmoidoscopy** or colonoscopy.** / Every 5 years for a flexible sigmoidoscopy or every 10 years for a colonoscopy beginning at age 32 and continuing until age 31.  Hepatitis C blood test.** / For all people born from [birth year] through 1963 and any individual with known risks for hepatitis C.  Abdominal aortic aneurysm (AAA) screening.** / A one-time screening for ages 71 to 65 years who are current or former smokers.  Skin self-exam. / Monthly.  Influenza vaccine. / Every year.  Tetanus, diphtheria, and acellular pertussis (Tdap/Td) vaccine.** / 1 dose of Td every 10 years.  Varicella vaccine.** / Consult your health care provider.  Zoster vaccine.** / 1 dose for adults aged 45 years or older.  Pneumococcal  13-valent conjugate (PCV13) vaccine.** / Consult your health care provider.  Pneumococcal polysaccharide (PPSV23) vaccine.** / 1 dose for all adults aged 27 years and older.  Meningococcal vaccine.** / Consult your health care provider.  Hepatitis A vaccine.** / Consult your health care provider.  Hepatitis B vaccine.** / Consult your health care provider.  Haemophilus influenzae type b (Hib) vaccine.** / Consult your health care provider. **Family history and personal history of risk and conditions may change your health care provider's recommendations. Document Released: 09/23/2001 Document Revised: 08/02/2013 Document Reviewed: 12/23/2010 [facility] Patient Information 2015 [place], [place]. This information is not intended to replace advice given to you by your health care provider. Make sure you discuss any questions you have with your health care provider.

## 2014-12-12 NOTE — Progress Notes (Signed)
Subjective:    Patient ID: Kathleen Thompson, female    DOB: 1934-07-15, 81 y.o.   MRN: [id number]  DOS:  12/12/2014 Type of visit - description : CPX Here for Medicare AWV:  1. Risk factors based on Past M, S, F history: reviewed 2. Physical Activities: takes walks, very active 3. Depression/mood: neg screening  4. Hearing:  No problems noted or reported  5. ADL's: independent, drives  6. Fall Risk: no recent falls, prevention discussed , see AVS 7. home Safety: does feel safe at home  8. Height, weight, & visual acuity: see VS, due to see the  eye doctor , plans to schedule 9. Counseling: provided 10. Labs ordered based on risk factors: if needed  11. Referral Coordination: if needed 12. Care Plan, see assessment and plan , written personalized plan provided , see AVS 13. Cognitive Assessment: motor skills and cognition appropriate for age 44. Care team updated, see Snap Shot  15. End-of-life care discussed  In addition, today we discussed the following:   GERD: Patient expresses good tolerance on Protonix and no recent episodes. She is good about avoiding trigger foods. Denies any dysphagia or odynophagia.   Hypertension: Patient notes good adherence to Hyzaar. She has dropped to 1/2 tablet of Coreg bid. She questions the need for two BP medications given her history of acceptable BPs in the past. She also notes fleeting moments where she feels low on energy which she attributes to occasional hypotension. She states that her systolic BP has fallen as low as 85 accompanied by symptoms of weakness and lethargy.  Hyperlipidemia: Patient notes good adherence to medications. Notes family history of CVA and personal history of TIA as motivation to adhere to both Lipitor and Plavix. Denies any headaches, does note some lower leg swelling usually worse at the end of the day.  CAD: Good adherence to Imdur. Denies any chest pain or palpitations. Does note occasional episodes of SOB which she  has had for a long time. Sx will appear and then disappear following brief moment of rest. States that sx is currently at baseline.   Hypothyroidism: Notes good adherence to Synthroid, denies any constipation/diarrhea or recent weight changes. Does note recent cold intolerance.   Review of Systems  Constitutional: No fever, chills. No unexplained wt changes. No unusual sweats HEENT: No dental problems, ear discharge, facial swelling, voice changes. No eye discharge, redness or intolerance to light Respiratory: No wheezing. No cough , mucus production Intermittent episodes of difficulty breathing.  Cardiovascular: No CP or palpitations. Mild lower leg swelling. GI: no nausea, vomiting, diarrhea or abdominal pain.  No blood in the stools. No dysphagia or odynophagia. Endocrine: No polyphagia, polyuria or polydipsia GU: No dysuria, gross hematuria, difficulty urinating. No urinary urgency or frequency. Musculoskeletal: No joint swellings or unusual aches or pains Skin: No change in the color of the skin, palor or rash Allergic, immunologic: No environmental allergies or food allergies Neurological: No dizziness or syncope. No headaches. No diplopia, slurred speech, motor deficits, facial numbness Hematological: No enlarged lymph nodes, easy bruising or bleeding Psychiatry: No suicidal ideas, hallucinations, behavior problems or confusion. No unusual/severe anxiety or depression.     Past Medical History  Diagnosis Date  . Internal hemorrhoid   . Diverticulosis   . Gastroparesis   . GERD (gastroesophageal reflux disease)   . Adenomatous colon polyp 02/2003  . Hyperlipidemia   . PVD (peripheral vascular disease)   . Hypothyroidism   . Hypertension   .  Iron deficiency anemia   . Esophageal stricture   . Arthritis   . Blood transfusion   . Cataract   . Osteoporosis   . TIA (transient ischemic attack)   . Stroke     TIA  . PONV (postoperative nausea and vomiting)     Past  Surgical History  Procedure Laterality Date  . Appendectomy    . Abdominal hysterectomy    . Cervical fusion    . Shoulder surgery     . Video assisted thoracoscopy (vats)/wedge resection Right 03/14/2013    Procedure: VIDEO ASSISTED THORACOSCOPY (VATS)/WEDGE RESECTION;  Surgeon: Melrose Nakayama, MD;  Location: [place];  Service: Thoracic;  Laterality: Right;  . Left heart catheterization with coronary angiogram N/A 12/20/2012    Procedure: LEFT HEART CATHETERIZATION WITH CORONARY ANGIOGRAM;  Surgeon: Peter M Martinique, MD;  Location: [facility] CATH LAB;  Service: Cardiovascular;  Laterality: N/A;    History   Social History  . Marital Status: Married    Spouse Name: N/A  . Number of Children: 2  . Years of Education: N/A   Occupational History  . Retired-[employer]     Social History Main Topics  . Smoking status: Never Smoker   . Smokeless tobacco: Never Used  . Alcohol Use: No  . Drug Use: No  . Sexual Activity: Yes   Other Topics Concern  . Not on file   Social History Narrative   Household-- pt and wife   2 boys , one in [place]       Family History  Problem Relation Age of Onset  . Colon cancer Brother     at age 75  . Colon cancer Sister     at age 40  . Diabetes Mother   . Diabetes Maternal Aunt   . Diabetes Maternal Uncle   . Heart attack Mother   . Heart attack Father   . Heart attack Sister   . Heart attack Brother   . Heart attack Brother   . Heart attack Brother   . Heart attack Brother   . Heart attack Sister   . Asthma Father        Medication List       This list is accurate as of: 12/12/14 11:59 PM.  Always use your most recent med list.               ALIGN 4 MG Caps  Take 1 capsule by mouth daily.     aspirin 81 MG tablet  Take 81 mg by mouth daily.     atorvastatin 40 MG tablet  Commonly known as:  LIPITOR  Take 1 tablet (40 mg total) by mouth daily.     CALCIUM PO  Take 1 tablet by mouth 2 (two) times daily.     clopidogrel 75 MG tablet   Commonly known as:  PLAVIX  Take 1 tablet (75 mg total) by mouth daily.     CO Q-10 PO  Take 1 tablet by mouth daily.     hydrOXYzine 25 MG tablet  Commonly known as:  ATARAX/VISTARIL  Take 1 tablet (25 mg total) by mouth every 6 (six) hours.     isosorbide mononitrate 30 MG 24 hr tablet  Commonly known as:  IMDUR  Take 1 tablet (30 mg total) by mouth daily.     levothyroxine 50 MCG tablet  Commonly known as:  SYNTHROID, LEVOTHROID  Take 1 tablet (50 mcg total) by mouth daily.     losartan-hydrochlorothiazide  100-12.5 MG per tablet  Commonly known as:  HYZAAR  Take 0.5 tablets by mouth 2 (two) times daily.     magnesium 30 MG tablet  Take 30 mg by mouth daily.     pantoprazole 40 MG tablet  Commonly known as:  PROTONIX  Take 1 tablet (40 mg total) by mouth daily. Take 30-60 minutes before first meal of day.     traMADol 50 MG tablet  Commonly known as:  ULTRAM  Take 1 tablet (50 mg total) by mouth 2 (two) times daily as needed.     vitamin D (CHOLECALCIFEROL) 400 UNITS tablet  Take 400 Units by mouth daily.           Objective:   Physical Exam BP 122/68 mmHg  Pulse 71  Temp(Src) 97.5 F (36.4 C) (Oral)  Ht 5\' 3"  (1.6 m)  Wt 135 lb 2 oz (61.292 kg)  BMI 23.94 kg/m2  SpO2 98%  General:   Well developed, well nourished . NAD.  Neck:  Full range of motion. Supple. No  Thyromegaly HEENT:  Normocephalic . Face symmetric, atraumatic Lungs:  CTA B Normal respiratory effort, no intercostal retractions, no accessory muscle use. Heart: RRR,  no murmur, distal pulses intact Abdomen:  Not distended, soft, non-tender. No rebound or rigidity. No mass,organomegaly Muscle skeletal: 1+ pretibial pitting edema bilaterally  Skin: Exposed areas without rash. Not pale. Not jaundice Neurologic:  alert & oriented X3.  Speech normal, gait appropriate for age and unassisted Psych: Cognition and judgment appear intact.  Cooperative with normal attention span and  concentration.  Behavior appropriate. No anxious or depressed appearing. Breast: no dominant mass, skin and nipples normal to inspection on palpation, axillary areas without mass or lymphadenopathy      Assessment & Plan:   (Patient seen along with   Aletta Edouard, medical student)   HLD: Well-controlled on Lipitor. Continue current regimen and check labs in a few days when patient returns fasting.  Hypothyroidism: Well-controlled on current medications. Patient denies any symptoms of hypo-/hyperthyroidism. Will check TSH when patient returns for labs.  GERD: Well-controlled with Protonix. Patient has been avoiding trigger foods as well. Continue current regimen.

## 2014-12-12 NOTE — Progress Notes (Signed)
Pre visit review using our clinic review tool, if applicable. No additional management support is needed unless otherwise documented below in the visit note. 

## 2014-12-13 ENCOUNTER — Other Ambulatory Visit (INDEPENDENT_AMBULATORY_CARE_PROVIDER_SITE_OTHER): Payer: Medicare Other

## 2014-12-13 ENCOUNTER — Telehealth: Payer: Self-pay

## 2014-12-13 DIAGNOSIS — E039 Hypothyroidism, unspecified: Secondary | ICD-10-CM | POA: Diagnosis not present

## 2014-12-13 DIAGNOSIS — I10 Essential (primary) hypertension: Secondary | ICD-10-CM | POA: Diagnosis not present

## 2014-12-13 DIAGNOSIS — E785 Hyperlipidemia, unspecified: Secondary | ICD-10-CM

## 2014-12-13 LAB — BASIC METABOLIC PANEL WITH GFR
BUN: 14 mg/dL (ref 6–23)
CO2: 30 meq/L (ref 19–32)
Calcium: 9.4 mg/dL (ref 8.4–10.5)
Chloride: 101 meq/L (ref 96–112)
Creatinine, Ser: 1.12 mg/dL (ref 0.40–1.20)
GFR: 49.91 mL/min — ABNORMAL LOW
Glucose, Bld: 93 mg/dL (ref 70–99)
Potassium: 4.2 meq/L (ref 3.5–5.1)
Sodium: 136 meq/L (ref 135–145)

## 2014-12-13 LAB — LIPID PANEL
Cholesterol: 176 mg/dL (ref 0–200)
HDL: 60.6 mg/dL (ref >39.00)
LDL Cholesterol: 98 mg/dL (ref 0–99)
NONHDL: 115.4
TRIGLYCERIDES: 89 mg/dL (ref 0.0–149.0)
Total CHOL/HDL Ratio: 3
VLDL: 17.8 mg/dL (ref 0.0–40.0)

## 2014-12-13 LAB — ALT: ALT: 14 U/L (ref 0–35)

## 2014-12-13 LAB — AST: AST: 22 U/L (ref 0–37)

## 2014-12-13 LAB — TSH: TSH: 3.32 u[IU]/mL (ref 0.35–4.50)

## 2014-12-13 MED ORDER — CLOPIDOGREL BISULFATE 75 MG PO TABS: 75.0000 mg | ORAL_TABLET | Freq: Every day | ORAL | Status: DC

## 2014-12-13 MED ORDER — LOSARTAN POTASSIUM-HCTZ 100-12.5 MG PO TABS: 0.5000 | ORAL_TABLET | Freq: Two times a day (BID) | ORAL | Status: DC

## 2014-12-13 MED ORDER — PANTOPRAZOLE SODIUM 40 MG PO TBEC: 40.0000 mg | DELAYED_RELEASE_TABLET | Freq: Every day | ORAL | Status: DC

## 2014-12-13 MED ORDER — TRAMADOL HCL 50 MG PO TABS
50.0000 mg | ORAL_TABLET | Freq: Two times a day (BID) | ORAL | Status: DC | PRN
Start: 2014-12-13 — End: 2015-06-06

## 2014-12-13 MED ORDER — ATORVASTATIN CALCIUM 40 MG PO TABS: 40.0000 mg | ORAL_TABLET | Freq: Every day | ORAL | Status: DC

## 2014-12-13 NOTE — Assessment & Plan Note (Signed)
History of a granuloma resection RUL, patient concernI would like to get a chest x-ray which is ordered today. She is asymptomatic

## 2014-12-13 NOTE — Assessment & Plan Note (Signed)
BP is occasionally low with current regimen of medications, with systolic BP occasionally dropping below 100 (lowest - 85). These episodes are accompanied by weakness and lethargy. She denies any chest pain or headaches. Plan: Discontinue Coreg, continue taking Hyzaar as presently prescribed. Patient advised on checking BP multiple times per week with instructions to let us know if systolic BP is consistently outside range , see AVS.

## 2014-12-13 NOTE — Assessment & Plan Note (Signed)
  Patient notes occasional episodes of dyspnea which resolve quickly with rest, sx are chronic and at baseline.  Recommend observation. Also has occasional lower extremity edema,  occurs more during the summer and after being on feet all day. Patient is elevating when possible and maintaining low salt diet. Patient counseled to continue these lifestyle changes.

## 2014-12-13 NOTE — Telephone Encounter (Signed)
Pt requested refill on Tramadol at CPE visit yesterday (5/3/206). Tramadol Rx printed and signed by Dr. Larose Kells and faxed to [place] [facility].

## 2014-12-13 NOTE — Assessment & Plan Note (Signed)
Flu vaccine-- 05/08/14  Tdap-- 02/18/10  PNA-- 02/18/10 (23),  prevnar 10/11/13  Shingles-- 02/26/11   Female care -- last visit w/ Gyn ~ 4-5 years S/p hysterectomy for benign reasons, no h/o abnormal PAPs, last PAP >5 years ago .We agreed no further PAPs Last MMG-- >5 years ago per patient , no FH, no abnormal MMGs before , breast exam negative today,  Schedule mammogram, reassess the need for screening next year   CCS-- 08/21/11 with Lucio Edward, MD - diverticulosis, recommended f/u 5 years  Diet and exercise discussed

## 2015-04-04 ENCOUNTER — Telehealth: Payer: Self-pay | Admitting: Internal Medicine

## 2015-04-04 MED ORDER — CICLOPIROX 8 % EX SOLN: CUTANEOUS | Status: DC

## 2015-04-04 NOTE — Telephone Encounter (Signed)
Spoke with Pt, informed her that Rx has been sent to [facility]. Instructed her that if problem with nail does not resolve she will need to be seen by Dr. Larose Kells. Pt verbalized understanding.

## 2015-04-04 NOTE — Telephone Encounter (Signed)
Caller name:Bob Relation to EV:[id number] Call back [phone] Pharmacy:[facility]  Reason for call: pt is wanting to know if dr. Larose Kells can send her in another rx ciclopirox topical solution 8% states she lost her rx and when she located it it was to old for her to get refilled . Pt states it is for a finger nails, states her nails were pulling away from the skin and Dr. Larose Kells had written it for her.

## 2015-04-04 NOTE — Telephone Encounter (Signed)
Dr. Arnoldo Morale prescribed that medication back in 2013, okay to prescribe 1, I like to see her before we continue prescribing it.

## 2015-04-04 NOTE — Telephone Encounter (Signed)
Rx sent to [facility] on [address].

## 2015-04-04 NOTE — Telephone Encounter (Signed)
Please advise. Last Rx written for Ciclopirox Topical Solution 8 % was 11/04/2011 #6.65mL 0RF.

## 2015-04-11 ENCOUNTER — Other Ambulatory Visit: Payer: Self-pay | Admitting: Internal Medicine

## 2015-04-13 ENCOUNTER — Other Ambulatory Visit: Payer: Self-pay | Admitting: Internal Medicine

## 2015-05-21 ENCOUNTER — Ambulatory Visit (HOSPITAL_BASED_OUTPATIENT_CLINIC_OR_DEPARTMENT_OTHER)
Admission: RE | Admit: 2015-05-21 | Discharge: 2015-05-21 | Disposition: A | Discharge status: Home / Self Care | Payer: Medicare Other | Source: Ambulatory Visit | Attending: Internal Medicine | Admitting: Internal Medicine

## 2015-05-21 ENCOUNTER — Ambulatory Visit (INDEPENDENT_AMBULATORY_CARE_PROVIDER_SITE_OTHER): Payer: Medicare Other | Admitting: Internal Medicine

## 2015-05-21 ENCOUNTER — Encounter: Payer: Self-pay | Admitting: Internal Medicine

## 2015-05-21 VITALS — BP 118/62 | HR 82 | Temp 97.6°F | Ht 63.0 in | Wt 144.2 lb

## 2015-05-21 DIAGNOSIS — M25572 Pain in left ankle and joints of left foot: Secondary | ICD-10-CM | POA: Diagnosis not present

## 2015-05-21 DIAGNOSIS — I10 Essential (primary) hypertension: Secondary | ICD-10-CM | POA: Diagnosis not present

## 2015-05-21 DIAGNOSIS — E039 Hypothyroidism, unspecified: Secondary | ICD-10-CM | POA: Diagnosis not present

## 2015-05-21 DIAGNOSIS — S99912A Unspecified injury of left ankle, initial encounter: Secondary | ICD-10-CM

## 2015-05-21 DIAGNOSIS — Z09 Encounter for follow-up examination after completed treatment for conditions other than malignant neoplasm: Secondary | ICD-10-CM

## 2015-05-21 DIAGNOSIS — M1990 Unspecified osteoarthritis, unspecified site: Secondary | ICD-10-CM

## 2015-05-21 DIAGNOSIS — M7989 Other specified soft tissue disorders: Secondary | ICD-10-CM | POA: Diagnosis not present

## 2015-05-21 NOTE — Progress Notes (Signed)
Pre visit review using our clinic review tool, if applicable. No additional management support is needed unless otherwise documented below in the visit note. 

## 2015-05-21 NOTE — Progress Notes (Signed)
Subjective:    Patient ID: Kathleen Thompson, female    DOB: 1934-03-02, 81 y.o.   MRN: [id number]  DOS:  05/21/2015 Type of visit - description : Acute visit (we also discussed chronic issues) Interval history: 2 months ago, twisted her left ankle outward, then she fell. Hit her head but denies any bleeding, loss of consciousness, neck pain or headaches. Went to see a foot doctor, x-ray was reportedly negative and was prescribed observation. Since then the swelling is a little better but not completely well. She has been unable to put full weight on that ankle since.  HTN: good medication compliance, ambulatory BPs within normal Hypothyroidism, good compliance of medication.   Review of Systems Denies chest pain, occasionally has difficulty breathing, not anything. No nausea, vomiting, diarrhea  Past Medical History  Diagnosis Date  . Internal hemorrhoid   . Diverticulosis   . Gastroparesis   . GERD (gastroesophageal reflux disease)   . Adenomatous colon polyp 02/2003  . Hyperlipidemia   . PVD (peripheral vascular disease) ([place])   . Hypothyroidism   . Hypertension   . Iron deficiency anemia   . Esophageal stricture   . Arthritis   . Blood transfusion   . Cataract   . Osteoporosis   . TIA (transient ischemic attack)   . Stroke [facility])     TIA  . PONV (postoperative nausea and vomiting)     Past Surgical History  Procedure Laterality Date  . Appendectomy    . Abdominal hysterectomy    . Cervical fusion    . Shoulder surgery     . Video assisted thoracoscopy (vats)/wedge resection Right 03/14/2013    Procedure: VIDEO ASSISTED THORACOSCOPY (VATS)/WEDGE RESECTION;  Surgeon: Melrose Nakayama, MD;  Location: [place];  Service: Thoracic;  Laterality: Right;  . Left heart catheterization with coronary angiogram N/A 12/20/2012    Procedure: LEFT HEART CATHETERIZATION WITH CORONARY ANGIOGRAM;  Surgeon: Peter M Martinique, MD;  Location: [facility] CATH LAB;  Service: Cardiovascular;   Laterality: N/A;    Social History   Social History  . Marital Status: Married    Spouse Name: N/A  . Number of Children: 2  . Years of Education: N/A   Occupational History  . Retired-[employer]     Social History Main Topics  . Smoking status: Never Smoker   . Smokeless tobacco: Never Used  . Alcohol Use: No  . Drug Use: No  . Sexual Activity: Yes   Other Topics Concern  . Not on file   Social History Narrative   Household-- pt and wife   2 boys , one in [place]         Medication List       This list is accurate as of: 05/21/15  2:39 PM.  Always use your most recent med list.               ALIGN 4 MG Caps  Take 1 capsule by mouth daily.     aspirin 81 MG tablet  Take 81 mg by mouth daily.     atorvastatin 40 MG tablet  Commonly known as:  LIPITOR  Take 1 tablet (40 mg total) by mouth daily.     CALCIUM PO  Take 1 tablet by mouth 2 (two) times daily.     ciclopirox 8 % solution  Commonly known as:  PENLAC  Apply over nail and surrounding skin. Apply daily over previous coat. After seven (7) days, may remove with alcohol and  continue cycle.     clopidogrel 75 MG tablet  Commonly known as:  PLAVIX  Take 1 tablet (75 mg total) by mouth daily.     CO Q-10 PO  Take 1 tablet by mouth daily.     hydrOXYzine 25 MG tablet  Commonly known as:  ATARAX/VISTARIL  Take 1 tablet (25 mg total) by mouth every 6 (six) hours.     isosorbide mononitrate 30 MG 24 hr tablet  Commonly known as:  IMDUR  Take 1 tablet (30 mg total) by mouth daily.     levothyroxine 50 MCG tablet  Commonly known as:  SYNTHROID, LEVOTHROID  Take 1 tablet (50 mcg total) by mouth daily before breakfast.     losartan-hydrochlorothiazide 100-12.5 MG tablet  Commonly known as:  HYZAAR  Take 0.5 tablets by mouth 2 (two) times daily.     magnesium 30 MG tablet  Take 30 mg by mouth daily.     pantoprazole 40 MG tablet  Commonly known as:  PROTONIX  Take 1 tablet (40 mg total) by mouth daily.  Take 30-60 minutes before first meal of day.     traMADol 50 MG tablet  Commonly known as:  ULTRAM  Take 1 tablet (50 mg total) by mouth 2 (two) times daily as needed.     vitamin D (CHOLECALCIFEROL) 400 UNITS tablet  Take 400 Units by mouth daily.           Objective:   Physical Exam BP 118/62 mmHg  Pulse 82  Temp(Src) 97.6 F (36.4 C) (Oral)  Ht 5\' 3"  (1.6 m)  Wt 144 lb 4 oz (65.431 kg)  BMI 25.56 kg/m2  SpO2 90% General:   Well developed, well nourished . NAD.  HEENT:  Normocephalic . Face symmetric, atraumatic MSK: Right ankle normal Left ankle --swollen throughout, no deformities, no warmness, no redness. Mildly TTP and the external malleolus. Range of motion seems slightly decreased compared to the right. Skin: Not pale. Not jaundice Neurologic:  alert & oriented X3.  Speech normal, gait  unassisted, normal. Psych--  Cognition and judgment appear intact.  Cooperative with normal attention span and concentration.  Behavior appropriate. No anxious or depressed appearing.      Assessment & Plan:   Assessment> HTN Hypothyroidism Hyperlipidemia CAD cath 12/2012 single-vessel CAD,rx CV RF control, on Plavix, imdur ?TIA remotely on plavix per previous PCP Osteoporosis - per DEXA done elswhere DJD- s/p neck and shoulder surgeries, Dr Lorin Mercy  GI: GED, gastroparesis, diverticulosis, esophageal stricture H/o Iron deficiency anemia H/o granuloma RUL, resected 03-14-13  Plan Ankle sprain: Not improving after 2 months, repeat the x-ray and refer to orthopedic surgery Dr. Lorin Mercy Hypothyroidism: Check a TSH continue with synthroid HTN: Check a BMP, BP seems to be under excellent control DJD: On Ultram as needed, check a UDS

## 2015-05-21 NOTE — Assessment & Plan Note (Signed)
Ankle sprain: Not improving after 2 months, repeat the x-ray and refer to orthopedic surgery Dr. Lorin Mercy Hypothyroidism: Check a TSH continue with synthroid HTN: Check a BMP, BP seems to be under excellent control DJD: On Ultram as needed, check a UDS

## 2015-05-21 NOTE — Patient Instructions (Signed)
Get your blood work before you leave .   Will also need a urine sample.  Stop by the first floor and get the XR      Next visit  for a physical exam by May 2017     Please schedule an appointment at the front desk

## 2015-05-22 LAB — BASIC METABOLIC PANEL
BUN: 11 mg/dL (ref 6–23)
CALCIUM: 9.2 mg/dL (ref 8.4–10.5)
CO2: 25 mEq/L (ref 19–32)
CREATININE: 0.95 mg/dL (ref 0.40–1.20)
Chloride: 100 mEq/L (ref 96–112)
GFR: 60.28 mL/min (ref >60.00)
GLUCOSE: 85 mg/dL (ref 70–99)
Potassium: 3.6 mEq/L (ref 3.5–5.1)
Sodium: 137 mEq/L (ref 135–145)

## 2015-05-22 LAB — TSH: TSH: 1.43 u[IU]/mL (ref 0.35–4.50)

## 2015-06-06 ENCOUNTER — Other Ambulatory Visit: Payer: Self-pay | Admitting: Internal Medicine

## 2015-06-06 NOTE — Telephone Encounter (Signed)
Okay #180, no refills 

## 2015-06-06 NOTE — Telephone Encounter (Signed)
Pt is requesting refill on Tramadol.  Last OV: 05/21/2015 Last Fill: 12/13/2014 #180 and 1RF UDS: 05/21/2015, awaiting results   Please advise.

## 2015-06-06 NOTE — Telephone Encounter (Signed)
Rx faxed to [facility]  

## 2015-06-06 NOTE — Telephone Encounter (Signed)
Rx printed, awaiting MD signature.  

## 2015-06-15 ENCOUNTER — Other Ambulatory Visit: Payer: Self-pay | Admitting: Orthopaedic Surgery

## 2015-06-15 ENCOUNTER — Ambulatory Visit: Payer: Medicare Other | Admitting: Internal Medicine

## 2015-06-15 DIAGNOSIS — M25572 Pain in left ankle and joints of left foot: Secondary | ICD-10-CM

## 2015-06-25 ENCOUNTER — Telehealth: Payer: Self-pay

## 2015-06-25 NOTE — Telephone Encounter (Signed)
UDS: 05/21/2015  Positive for Tramadol Positive for Temazepam: Diazepam Last filled in 2013-2014, taking older Rx   Low risk per Dr. Larose Kells 06/25/2015

## 2015-06-28 ENCOUNTER — Ambulatory Visit
Admission: RE | Admit: 2015-06-28 | Discharge: 2015-06-28 | Disposition: A | Discharge status: Home / Self Care | Payer: Medicare Other | Source: Ambulatory Visit | Attending: Orthopaedic Surgery | Admitting: Orthopaedic Surgery

## 2015-06-28 DIAGNOSIS — M25572 Pain in left ankle and joints of left foot: Secondary | ICD-10-CM

## 2015-07-09 ENCOUNTER — Other Ambulatory Visit: Payer: Self-pay | Admitting: Internal Medicine

## 2015-08-06 ENCOUNTER — Other Ambulatory Visit: Payer: Self-pay | Admitting: Internal Medicine

## 2015-08-27 ENCOUNTER — Encounter: Payer: Self-pay | Admitting: Cardiology

## 2015-09-02 ENCOUNTER — Other Ambulatory Visit: Payer: Self-pay | Admitting: Internal Medicine

## 2015-09-03 NOTE — Telephone Encounter (Signed)
Ok 180, no RF 

## 2015-09-03 NOTE — Telephone Encounter (Signed)
Rx faxed to [facility]  

## 2015-09-03 NOTE — Telephone Encounter (Signed)
Rx printed, awaiting MD signature.  

## 2015-09-03 NOTE — Telephone Encounter (Signed)
Pt is requesting refill on Tramadol.  Last OV: 05/21/2015 Last Fill: 06/06/2015 #180 and 0RF UDS: 05/21/2015 Low risk  Please advise .

## 2015-09-11 ENCOUNTER — Other Ambulatory Visit: Payer: Self-pay | Admitting: Internal Medicine

## 2015-12-02 ENCOUNTER — Other Ambulatory Visit: Payer: Self-pay | Admitting: Internal Medicine

## 2015-12-03 NOTE — Telephone Encounter (Signed)
Last OV: 05/21/15 Last filled: 09/03/15, #180, 0 RF Sig: Take 1 tablet (50 mg total) by mouth 2 (two) times daily as needed. UDS: 05/21/15, positive for tramadol, low risk

## 2015-12-07 ENCOUNTER — Other Ambulatory Visit: Payer: Self-pay

## 2015-12-07 MED ORDER — TRAMADOL HCL 50 MG PO TABS: 50.0000 mg | ORAL_TABLET | Freq: Two times a day (BID) | ORAL | Status: DC | PRN

## 2015-12-13 ENCOUNTER — Ambulatory Visit (INDEPENDENT_AMBULATORY_CARE_PROVIDER_SITE_OTHER): Payer: Medicare Other | Admitting: Internal Medicine

## 2015-12-13 ENCOUNTER — Encounter: Payer: Self-pay | Admitting: Internal Medicine

## 2015-12-13 VITALS — BP 120/80 | HR 86 | Temp 98.1°F | Ht 63.0 in | Wt 150.1 lb

## 2015-12-13 DIAGNOSIS — D539 Nutritional anemia, unspecified: Secondary | ICD-10-CM

## 2015-12-13 DIAGNOSIS — I10 Essential (primary) hypertension: Secondary | ICD-10-CM | POA: Diagnosis not present

## 2015-12-13 DIAGNOSIS — Z0000 Encounter for general adult medical examination without abnormal findings: Secondary | ICD-10-CM

## 2015-12-13 DIAGNOSIS — I2510 Atherosclerotic heart disease of native coronary artery without angina pectoris: Secondary | ICD-10-CM | POA: Diagnosis not present

## 2015-12-13 DIAGNOSIS — E785 Hyperlipidemia, unspecified: Secondary | ICD-10-CM | POA: Diagnosis not present

## 2015-12-13 DIAGNOSIS — E039 Hypothyroidism, unspecified: Secondary | ICD-10-CM

## 2015-12-13 DIAGNOSIS — Z09 Encounter for follow-up examination after completed treatment for conditions other than malignant neoplasm: Secondary | ICD-10-CM

## 2015-12-13 DIAGNOSIS — M810 Age-related osteoporosis without current pathological fracture: Secondary | ICD-10-CM

## 2015-12-13 LAB — CBC WITH DIFFERENTIAL/PLATELET
BASOS PCT: 0.7 % (ref 0.0–3.0)
Basophils Absolute: 0 10*3/uL (ref 0.0–0.1)
EOS ABS: 0.1 10*3/uL (ref 0.0–0.7)
Eosinophils Relative: 2.4 % (ref 0.0–5.0)
HEMATOCRIT: 40.9 % (ref 36.0–46.0)
Hemoglobin: 13.8 g/dL (ref 12.0–15.0)
LYMPHS ABS: 2.5 10*3/uL (ref 0.7–4.0)
LYMPHS PCT: 47.5 % — AB (ref 12.0–46.0)
MCHC: 33.6 g/dL (ref 30.0–36.0)
MCV: 93.2 fl (ref 78.0–100.0)
MONO ABS: 0.4 10*3/uL (ref 0.1–1.0)
Monocytes Relative: 7.6 % (ref 3.0–12.0)
NEUTROS ABS: 2.2 10*3/uL (ref 1.4–7.7)
Neutrophils Relative %: 41.8 % — ABNORMAL LOW (ref 43.0–77.0)
PLATELETS: 265 10*3/uL (ref 150.0–400.0)
RBC: 4.39 Mil/uL (ref 3.87–5.11)
RDW: 13.9 % (ref 11.5–15.5)
WBC: 5.2 10*3/uL (ref 4.0–10.5)

## 2015-12-13 LAB — COMPREHENSIVE METABOLIC PANEL
ALT: 18 U/L (ref 0–35)
AST: 26 U/L (ref 0–37)
Albumin: 4.3 g/dL (ref 3.5–5.2)
Alkaline Phosphatase: 78 U/L (ref 39–117)
BUN: 13 mg/dL (ref 6–23)
CHLORIDE: 102 meq/L (ref 96–112)
CO2: 27 meq/L (ref 19–32)
CREATININE: 0.86 mg/dL (ref 0.40–1.20)
Calcium: 9.6 mg/dL (ref 8.4–10.5)
GFR: 67.52 mL/min (ref >60.00)
GLUCOSE: 97 mg/dL (ref 70–99)
Potassium: 3.7 mEq/L (ref 3.5–5.1)
SODIUM: 137 meq/L (ref 135–145)
Total Bilirubin: 1.1 mg/dL (ref 0.2–1.2)
Total Protein: 6.9 g/dL (ref 6.0–8.3)

## 2015-12-13 LAB — LIPID PANEL
CHOL/HDL RATIO: 3
Cholesterol: 187 mg/dL (ref 0–200)
HDL: 67.2 mg/dL (ref >39.00)
LDL Cholesterol: 96 mg/dL (ref 0–99)
NonHDL: 120
TRIGLYCERIDES: 122 mg/dL (ref 0.0–149.0)
VLDL: 24.4 mg/dL (ref 0.0–40.0)

## 2015-12-13 MED ORDER — TRAMADOL HCL 50 MG PO TABS
50.0000 mg | ORAL_TABLET | Freq: Two times a day (BID) | ORAL | Status: DC | PRN
Start: 2015-12-13 — End: 2016-06-09

## 2015-12-13 MED ORDER — LEVOTHYROXINE SODIUM 50 MCG PO TABS: 50.0000 ug | ORAL_TABLET | Freq: Every day | ORAL | Status: DC

## 2015-12-13 NOTE — Progress Notes (Signed)
Pre visit review using our clinic review tool, if applicable. No additional management support is needed unless otherwise documented below in the visit note. 

## 2015-12-13 NOTE — Assessment & Plan Note (Addendum)
Tdap-- 02/18/10 ; PNA-- 02/18/10 (23),  prevnar 10/11/13 ; Shingles-- 02/26/11   Does not see gynecology anymore.  No further paps, see previous entries  Last MMG-- >5 years ago per patient , no FH, no abnormal MMGs before , patient elected no further screening which is within the guidelines.   CCS-- 08/21/11 with Lucio Edward, MD - diverticulosis, recommended f/u 5 years Diet and exercise discussed

## 2015-12-13 NOTE — Progress Notes (Signed)
Subjective:    Patient ID: Kathleen Thompson, female    DOB: 1933/10/18, 82 y.o.   MRN: [id number]  DOS:  12/13/2015 Type of visit - description : Complete physical exam Interval history: Hypothyroidism: Would like to change to branded synthroid . Ankle sprain: Saw orthopedic surgery, declined surgery, used a boot , slt better CAD, HTN, high cholesterol. Good compliance of medication, no apparent side effects, BP is normal, no chest pain.  . Review of Systems Constitutional: No fever. No chills. No unexplained wt changes. No unusual sweats  HEENT: No dental problems, no ear discharge, no facial swelling, no voice changes. No eye discharge, no eye  redness , no  intolerance to light   Respiratory: No wheezing , no  difficulty breathing. No cough , no mucus production  Cardiovascular: No CP, no leg swelling , no  Palpitations  GI: no nausea, no vomiting, no diarrhea , no  abdominal pain.  No blood in the stools. No dysphagia, no odynophagia    Endocrine: No polyphagia, no polyuria , no polydipsia  GU: No dysuria, gross hematuria, difficulty urinating. No urinary urgency, no frequency.  Musculoskeletal: No joint swellings or unusual aches or pains  Skin: No change in the color of the skin, palor , no  Rash  Allergic, immunologic: No environmental allergies , no  food allergies  Neurological: No dizziness no  syncope. No headaches. No diplopia, no slurred, no slurred speech, no motor deficits, no facial  Numbness  Hematological: No enlarged lymph nodes, no easy bruising , no unusual bleedings  Psychiatry: No suicidal ideas, no hallucinations, no beavior problems, no confusion.  No unusual/severe anxiety, no depression   Past Medical History  Diagnosis Date  . Internal hemorrhoid   . Diverticulosis   . Gastroparesis   . GERD (gastroesophageal reflux disease)   . Adenomatous colon polyp 02/2003  . Hyperlipidemia   . PVD (peripheral vascular disease) ([place])   . Hypothyroidism    . Hypertension   . Iron deficiency anemia   . Esophageal stricture   . Arthritis   . Blood transfusion   . Cataract   . Osteoporosis   . TIA (transient ischemic attack)   . Stroke [facility])     TIA  . PONV (postoperative nausea and vomiting)     Past Surgical History  Procedure Laterality Date  . Appendectomy    . Abdominal hysterectomy    . Cervical fusion    . Shoulder surgery     . Video assisted thoracoscopy (vats)/wedge resection Right 03/14/2013    Procedure: VIDEO ASSISTED THORACOSCOPY (VATS)/WEDGE RESECTION;  Surgeon: Melrose Nakayama, MD;  Location: [facility];  Service: Thoracic;  Laterality: Right;  . Left heart catheterization with coronary angiogram N/A 12/20/2012    Procedure: LEFT HEART CATHETERIZATION WITH CORONARY ANGIOGRAM;  Surgeon: Peter M Martinique, MD;  Location: [facility] CATH LAB;  Service: Cardiovascular;  Laterality: N/A;    Social History   Social History  . Marital Status: Married    Spouse Name: N/A  . Number of Children: 2  . Years of Education: N/A   Occupational History  . Retired-[employer]     Social History Main Topics  . Smoking status: Never Smoker   . Smokeless tobacco: Never Used  . Alcohol Use: No  . Drug Use: No  . Sexual Activity: Yes   Other Topics Concern  . Not on file   Social History Narrative   Household-- pt and wife   2 boys , one in  [place], on in [facility]      Family History  Problem Relation Age of Onset  . Colon cancer Brother     at age 78  . Colon cancer Sister     at age 76  . Diabetes Mother   . Diabetes Maternal Aunt   . Diabetes Maternal Uncle   . Heart attack Mother   . Heart attack Father   . Heart attack Sister   . Heart attack Brother   . Heart attack Brother   . Heart attack Brother   . Heart attack Brother   . Heart attack Sister   . Asthma Father   . Breast cancer Neg Hx        Medication List       This list is accurate as of: 12/13/15 11:59 PM.  Always use your most recent med list.                ALIGN 4 MG Caps  Take 1 capsule by mouth daily.     aspirin 81 MG tablet  Take 81 mg by mouth daily.     atorvastatin 40 MG tablet  Commonly known as:  LIPITOR  Take 1 tablet (40 mg total) by mouth daily.     CALCIUM PO  Take 1 tablet by mouth 2 (two) times daily.     ciclopirox 8 % solution  Commonly known as:  PENLAC  Apply over nail and surrounding skin. Apply daily over previous coat. After seven (7) days, may remove with alcohol and continue cycle.     clopidogrel 75 MG tablet  Commonly known as:  PLAVIX  Take 1 tablet (75 mg total) by mouth daily.     CO Q-10 PO  Take 1 tablet by mouth daily.     isosorbide mononitrate 30 MG 24 hr tablet  Commonly known as:  IMDUR  Take 1 tablet (30 mg total) by mouth daily.     levothyroxine 50 MCG tablet  Commonly known as:  SYNTHROID  Take 1 tablet (50 mcg total) by mouth daily before breakfast. Criss Rosales only     losartan-hydrochlorothiazide 100-12.5 MG tablet  Commonly known as:  HYZAAR  Take 0.5 tablets by mouth 2 (two) times daily.     magnesium 30 MG tablet  Take 30 mg by mouth daily.     pantoprazole 40 MG tablet  Commonly known as:  PROTONIX  Take 1 tablet (40 mg total) by mouth daily. Take 30-60 minutes before first meal of day.     traMADol 50 MG tablet  Commonly known as:  ULTRAM  Take 1 tablet (50 mg total) by mouth 2 (two) times daily as needed.     vitamin D (CHOLECALCIFEROL) 400 units tablet  Take 400 Units by mouth daily.           Objective:   Physical Exam BP 120/80 mmHg  Pulse 86  Temp(Src) 98.1 F (36.7 C) (Oral)  Ht 5\' 3"  (1.6 m)  Wt 150 lb 2 oz (68.096 kg)  BMI 26.60 kg/m2  SpO2 93%  General:   Well developed, well nourished . NAD.  Neck: No  thyromegaly   HEENT:  Normocephalic . Face symmetric, atraumatic Lungs:  CTA B Normal respiratory effort, no intercostal retractions, no accessory muscle use. Heart: RRR,  no murmur.  Trace pretibial edema bilaterally  Abdomen:  Not distended,  soft, non-tender. No rebound or rigidity.   Skin: Exposed areas without rash. Not pale. Not jaundice MSK: Left ankle  slightly swollen compared to the right Neurologic:  alert & oriented X3.  Speech normal, gait appropriate for age and unassisted Strength symmetric and appropriate for age.  Psych: Cognition and judgment appear intact.  Cooperative with normal attention span and concentration.  Behavior appropriate. No anxious or depressed appearing.    Assessment & Plan:   Assessment> HTN Hypothyroidism Hyperlipidemia CAD cath 12/2012 single-vessel CAD,rx CV RF control, on Plavix, imdur ?TIA remotely on plavix per previous PCP Osteoporosis -  DEXAs @ [facility]  MSK: Dr Lorin Mercy --DJD- s/p neck and shoulder surgeries  -- on tramadol ,UDS no murmurs 05-2015  GI: GED, gastroparesis, diverticulosis, esophageal stricture H/o Iron deficiency anemia H/o granuloma RUL, resected 03-14-13  PLAN:  HTN: Continue Hyzaar, check a CMP High cholesterol: Continue Lipitor, check a FLP   hypothyroidism: Switch to branded Synthroid, recheck a TSH in 2 months. CAD: Asymptomatic. EKG today sinus rhythm, no acute changes Osteoporosis: able to get a report from 05/17/2007, T score -2.8. Plan: Repeat a bone density test. DJD: Refill tramadol, saw Dr. Huston Foley for ankle sprain, declined surgery, used a boot, slightly better. Anemia: History of, check a CBC.  RTC 6 months

## 2015-12-13 NOTE — Patient Instructions (Signed)
GO TO THE LAB :      Get the blood work     GO TO THE FRONT DESK Schedule your next appointment for a  routine checkup in 6 months, no fasting  Also schedule labs to be done 6 weeks from today. TSH, hypothyroidism        Fall Prevention and Home Safety Falls cause injuries and can affect all age groups. It is possible to use preventive measures to significantly decrease the likelihood of falls. There are many simple measures which can make your home safer and prevent falls. OUTDOORS  Repair cracks and edges of walkways and driveways.  Remove high doorway thresholds.  Trim shrubbery on the main path into your home.  Have good outside lighting.  Clear walkways of tools, rocks, debris, and clutter.  Check that handrails are not broken and are securely fastened. Both sides of steps should have handrails.  Have leaves, snow, and ice cleared regularly.  Use sand or salt on walkways during winter months.  In the garage, clean up grease or oil spills. BATHROOM  Install night lights.  Install grab bars by the toilet and in the tub and shower.  Use non-skid mats or decals in the tub or shower.  Place a plastic non-slip stool in the shower to sit on, if needed.  Keep floors dry and clean up all water on the floor immediately.  Remove soap buildup in the tub or shower on a regular basis.  Secure bath mats with non-slip, double-sided rug tape.  Remove throw rugs and tripping hazards from the floors. BEDROOMS  Install night lights.  Make sure a bedside light is easy to reach.  Do not use oversized bedding.  Keep a telephone by your bedside.  Have a firm chair with side arms to use for getting dressed.  Remove throw rugs and tripping hazards from the floor. KITCHEN  Keep handles on pots and pans turned toward the center of the stove. Use back burners when possible.  Clean up spills quickly and allow time for drying.  Avoid walking on wet floors.  Avoid hot  utensils and knives.  Position shelves so they are not too high or low.  Place commonly used objects within easy reach.  If necessary, use a sturdy step stool with a grab bar when reaching.  Keep electrical cables out of the way.  Do not use floor polish or wax that makes floors slippery. If you must use wax, use non-skid floor wax.  Remove throw rugs and tripping hazards from the floor. STAIRWAYS  Never leave objects on stairs.  Place handrails on both sides of stairways and use them. Fix any loose handrails. Make sure handrails on both sides of the stairways are as long as the stairs.  Check carpeting to make sure it is firmly attached along stairs. Make repairs to worn or loose carpet promptly.  Avoid placing throw rugs at the top or bottom of stairways, or properly secure the rug with carpet tape to prevent slippage. Get rid of throw rugs, if possible.  Have an electrician put in a light switch at the top and bottom of the stairs. OTHER FALL PREVENTION TIPS  Wear low-heel or rubber-soled shoes that are supportive and fit well. Wear closed toe shoes.  When using a stepladder, make sure it is fully opened and both spreaders are firmly locked. Do not climb a closed stepladder.  Add color or contrast paint or tape to grab bars and handrails in your  home. Place contrasting color strips on first and last steps.  Learn and use mobility aids as needed. Install an electrical emergency response system.  Turn on lights to avoid dark areas. Replace light bulbs that burn out immediately. Get light switches that glow.  Arrange furniture to create clear pathways. Keep furniture in the same place.  Firmly attach carpet with non-skid or double-sided tape.  Eliminate uneven floor surfaces.  Select a carpet pattern that does not visually hide the edge of steps.  Be aware of all pets. OTHER HOME SAFETY TIPS  Set the water temperature for 120 F (48.8 C).  Keep emergency numbers on  or near the telephone.  Keep smoke detectors on every level of the home and near sleeping areas. Document Released: 07/18/2002 Document Revised: 01/27/2012 Document Reviewed: 10/17/2011 [facility] Patient Information 2015 [place], [place]. This information is not intended to replace advice given to you by your health care provider. Make sure you discuss any questions you have with your health care provider.   Preventive Care for Adults Ages 12 and over  Blood pressure check.** / Every 1 to 2 years.  Lipid and cholesterol check.**/ Every 5 years beginning at age 30.  Lung cancer screening. / Every year if you are aged 25-80 years and have a 30-pack-year history of smoking and currently smoke or have quit within the past 15 years. Yearly screening is stopped once you have quit smoking for at least 15 years or develop a health problem that would prevent you from having lung cancer treatment.  Fecal occult blood test (FOBT) of stool. / Every year beginning at age 40 and continuing until age 69. You may not have to do this test if you get a colonoscopy every 10 years.  Flexible sigmoidoscopy** or colonoscopy.** / Every 5 years for a flexible sigmoidoscopy or every 10 years for a colonoscopy beginning at age 30 and continuing until age 20.  Hepatitis C blood test.** / For all people born from [birth year] through 1963 and any individual with known risks for hepatitis C.  Abdominal aortic aneurysm (AAA) screening.** / A one-time screening for ages 14 to 4 years who are current or former smokers.  Skin self-exam. / Monthly.  Influenza vaccine. / Every year.  Tetanus, diphtheria, and acellular pertussis (Tdap/Td) vaccine.** / 1 dose of Td every 10 years.  Varicella vaccine.** / Consult your health care provider.  Zoster vaccine.** / 1 dose for adults aged 22 years or older.  Pneumococcal 13-valent conjugate (PCV13) vaccine.** / Consult your health care provider.  Pneumococcal polysaccharide (PPSV23)  vaccine.** / 1 dose for all adults aged 52 years and older.  Meningococcal vaccine.** / Consult your health care provider.  Hepatitis A vaccine.** / Consult your health care provider.  Hepatitis B vaccine.** / Consult your health care provider.  Haemophilus influenzae type b (Hib) vaccine.** / Consult your health care provider. **Family history and personal history of risk and conditions may change your health care provider's recommendations. Document Released: 09/23/2001 Document Revised: 08/02/2013 Document Reviewed: 12/23/2010 [facility] Patient Information 2015 [place], [place]. This information is not intended to replace advice given to you by your health care provider. Make sure you discuss any questions you have with your health care provider.

## 2015-12-14 NOTE — Assessment & Plan Note (Addendum)
HTN: Continue Hyzaar, check a CMP High cholesterol: Continue Lipitor, check a FLP   hypothyroidism: Switch to branded Synthroid, recheck a TSH in 2 months. CAD: Asymptomatic. EKG today sinus rhythm, no acute changes Osteoporosis: able to get a report from 05/17/2007, T score -2.8. Plan: Repeat a bone density test. DJD: Refill tramadol, saw Dr. Huston Foley for ankle sprain, declined surgery, used a boot, slightly better. Anemia: History of, check a CBC.  RTC 6 months

## 2015-12-17 ENCOUNTER — Encounter: Payer: Self-pay | Admitting: Internal Medicine

## 2015-12-19 MED ORDER — ROSUVASTATIN CALCIUM 40 MG PO TABS: 40.0000 mg | ORAL_TABLET | Freq: Every day | ORAL | Status: DC

## 2015-12-19 NOTE — Addendum Note (Signed)
Addended by: Damita Dunnings D on: 12/19/2015 01:30 PM   Modules accepted: Orders, Medications

## 2015-12-24 LAB — HM DEXA SCAN

## 2016-01-02 ENCOUNTER — Telehealth: Payer: Self-pay | Admitting: Internal Medicine

## 2016-01-02 ENCOUNTER — Encounter: Payer: Self-pay | Admitting: Internal Medicine

## 2016-01-02 NOTE — Telephone Encounter (Signed)
Prolia insurance verification initiated via online [employer] portal. Awaiting determination. Verification request form sent for scanning.

## 2016-01-02 NOTE — Telephone Encounter (Signed)
Advise patient, T score -3.9 meaning osteoporosis, she needs treatment. She has a history of esophageal stricture consequently I recommend either: IV Reclast yearly or Prolia every 6 months. Personally I feel prolia is a great fit; if she is not agreement start the process of getting it. If not  Schedule a office visit to discuss further. Also, recommend to decrease  pantoprazole to one tablet every other day, it may affect her bone health.

## 2016-01-09 NOTE — Telephone Encounter (Signed)
Received Summary of Benefits. "Provider is in network. Prolia is subject to a $50 co-pay, up to a $4900 out of pocket max ($140 met). Whether office visit is billed or not, patient will be responsible for a $40 co-pay which will cover admin and the OV. Once out of pocket is met, coverage increases to 100 % of the contracted rate. Co-pays do apply to the out of pocket max, and will be waived." Summary of benefits sent for scanning.

## 2016-01-09 NOTE — Telephone Encounter (Signed)
MyChart message sent and letter printed and mailed informing Pt of results and recommendations. Instructed her to call insurance company directly for further questions and information regarding coverage for Reclast and Prolia.

## 2016-01-23 ENCOUNTER — Other Ambulatory Visit (INDEPENDENT_AMBULATORY_CARE_PROVIDER_SITE_OTHER): Payer: Medicare Other

## 2016-01-23 DIAGNOSIS — E039 Hypothyroidism, unspecified: Secondary | ICD-10-CM

## 2016-01-23 DIAGNOSIS — E785 Hyperlipidemia, unspecified: Secondary | ICD-10-CM | POA: Diagnosis not present

## 2016-01-23 LAB — LIPID PANEL
CHOL/HDL RATIO: 3
Cholesterol: 160 mg/dL (ref 0–200)
HDL: 61.7 mg/dL (ref >39.00)
LDL Cholesterol: 63 mg/dL (ref 0–99)
NONHDL: 98.2
Triglycerides: 175 mg/dL — ABNORMAL HIGH (ref 0.0–149.0)
VLDL: 35 mg/dL (ref 0.0–40.0)

## 2016-01-23 LAB — TSH: TSH: 1.25 u[IU]/mL (ref 0.35–4.50)

## 2016-01-23 LAB — ALT: ALT: 18 U/L (ref 0–35)

## 2016-01-23 LAB — AST: AST: 26 U/L (ref 0–37)

## 2016-02-19 ENCOUNTER — Other Ambulatory Visit: Payer: Self-pay | Admitting: Internal Medicine

## 2016-03-06 ENCOUNTER — Other Ambulatory Visit: Payer: Self-pay | Admitting: Internal Medicine

## 2016-03-09 ENCOUNTER — Other Ambulatory Visit: Payer: Self-pay | Admitting: Internal Medicine

## 2016-03-22 ENCOUNTER — Other Ambulatory Visit: Payer: Self-pay | Admitting: Internal Medicine

## 2016-04-07 ENCOUNTER — Other Ambulatory Visit: Payer: Self-pay | Admitting: Internal Medicine

## 2016-04-11 ENCOUNTER — Other Ambulatory Visit: Payer: Self-pay | Admitting: Internal Medicine

## 2016-05-03 ENCOUNTER — Encounter (HOSPITAL_BASED_OUTPATIENT_CLINIC_OR_DEPARTMENT_OTHER): Payer: Self-pay | Admitting: Emergency Medicine

## 2016-05-03 DIAGNOSIS — E039 Hypothyroidism, unspecified: Secondary | ICD-10-CM | POA: Diagnosis not present

## 2016-05-03 DIAGNOSIS — M542 Cervicalgia: Secondary | ICD-10-CM | POA: Diagnosis present

## 2016-05-03 DIAGNOSIS — M62838 Other muscle spasm: Secondary | ICD-10-CM | POA: Insufficient documentation

## 2016-05-03 DIAGNOSIS — Z7982 Long term (current) use of aspirin: Secondary | ICD-10-CM | POA: Insufficient documentation

## 2016-05-03 DIAGNOSIS — Z79899 Other long term (current) drug therapy: Secondary | ICD-10-CM | POA: Diagnosis not present

## 2016-05-03 DIAGNOSIS — I10 Essential (primary) hypertension: Secondary | ICD-10-CM | POA: Insufficient documentation

## 2016-05-03 NOTE — ED Triage Notes (Signed)
Pt inj c/o sharp intermittent pain to L neck and into jaw and face that started tonight while she was watching TV. Denies chest pain, SOB, n/v, dizziness or lightheadedness. Pt alert, interactive, ambulatory in NAD.

## 2016-05-04 ENCOUNTER — Emergency Department (HOSPITAL_BASED_OUTPATIENT_CLINIC_OR_DEPARTMENT_OTHER)
Admission: EM | Admit: 2016-05-04 | Discharge: 2016-05-04 | Disposition: A | Discharge status: Home / Self Care | Payer: Medicare Other | Attending: Emergency Medicine | Admitting: Emergency Medicine

## 2016-05-04 ENCOUNTER — Emergency Department (HOSPITAL_BASED_OUTPATIENT_CLINIC_OR_DEPARTMENT_OTHER): Payer: Medicare Other

## 2016-05-04 DIAGNOSIS — M7912 Myalgia of auxiliary muscles, head and neck: Secondary | ICD-10-CM

## 2016-05-04 LAB — CBC WITH DIFFERENTIAL/PLATELET
BASOS ABS: 0 10*3/uL (ref 0.0–0.1)
Basophils Relative: 0 %
EOS PCT: 1 %
Eosinophils Absolute: 0.1 10*3/uL (ref 0.0–0.7)
HCT: 39.8 % (ref 36.0–46.0)
HEMOGLOBIN: 14 g/dL (ref 12.0–15.0)
LYMPHS ABS: 2.8 10*3/uL (ref 0.7–4.0)
LYMPHS PCT: 39 %
MCH: 31.8 pg (ref 26.0–34.0)
MCHC: 35.2 g/dL (ref 30.0–36.0)
MCV: 90.5 fL (ref 78.0–100.0)
Monocytes Absolute: 0.5 10*3/uL (ref 0.1–1.0)
Monocytes Relative: 7 %
NEUTROS PCT: 53 %
Neutro Abs: 3.7 10*3/uL (ref 1.7–7.7)
PLATELETS: 213 10*3/uL (ref 150–400)
RBC: 4.4 MIL/uL (ref 3.87–5.11)
RDW: 12.9 % (ref 11.5–15.5)
WBC: 7 10*3/uL (ref 4.0–10.5)

## 2016-05-04 LAB — BASIC METABOLIC PANEL
ANION GAP: 9 (ref 5–15)
BUN: 18 mg/dL (ref 6–20)
CHLORIDE: 97 mmol/L — AB (ref 101–111)
CO2: 26 mmol/L (ref 22–32)
Calcium: 9.1 mg/dL (ref 8.9–10.3)
Creatinine, Ser: 1.05 mg/dL — ABNORMAL HIGH (ref 0.44–1.00)
GFR calc Af Amer: 57 mL/min — ABNORMAL LOW (ref >60)
GFR, EST NON AFRICAN AMERICAN: 49 mL/min — AB (ref >60)
GLUCOSE: 109 mg/dL — AB (ref 65–99)
POTASSIUM: 3.6 mmol/L (ref 3.5–5.1)
SODIUM: 132 mmol/L — AB (ref 135–145)

## 2016-05-04 LAB — TROPONIN I: Troponin I: <0.03 ng/mL (ref <0.03)

## 2016-05-04 MED ORDER — KETOROLAC TROMETHAMINE 30 MG/ML IJ SOLN
15.0000 mg | Freq: Once | INTRAMUSCULAR | Status: AC
  Administered 2016-05-04: 15 mg via INTRAVENOUS
  Filled 2016-05-04: qty 1

## 2016-05-04 MED ORDER — SODIUM CHLORIDE 0.9 % IV BOLUS (SEPSIS)
1000.0000 mL | Freq: Once | INTRAVENOUS | Status: AC
  Administered 2016-05-04: 1000 mL via INTRAVENOUS

## 2016-05-04 MED ORDER — IOPAMIDOL (ISOVUE-300) INJECTION 61%
100.0000 mL | Freq: Once | INTRAVENOUS | Status: AC | PRN
  Administered 2016-05-04: 100 mL via INTRAVENOUS

## 2016-05-04 NOTE — ED Notes (Signed)
Transported to CT 

## 2016-05-04 NOTE — ED Provider Notes (Signed)
[facility] Provider Note   CSN: [id number] Arrival date & time: 05/03/16  2230  By signing my name below, I, Julien Nordmann, attest that this documentation has been prepared under the direction and in the presence of Everlene Balls, MD.  Electronically Signed: Julien Nordmann, ED Scribe. 05/04/16. 1:01 AM.    History   Chief Complaint Chief Complaint  Patient presents with  . Neck Pain  . Jaw Pain    The history is provided by the patient. No language interpreter was used.   HPI Comments: Kathleen Thompson is a 82 y.o. female who presents to the Emergency Department complaining of sudden onset, intermittent, sharp, left sided neck pain that radiated into her jaw onset about 3 hours ago. She also describes the pain as a burning sensation. Pt says she was sitting on the cough watching television when she felt the sudden pain. Pt currently has a right upper tooth infection and she is on amoxicillin for that. She expresses that she has strong family cardiac hx and she just wanted to be evaluated. She is currently on plavix and aspirin. She denies chest pain, arm pain, difficulty breathing.  Past Medical History:  Diagnosis Date  . Adenomatous colon polyp 02/2003  . Arthritis   . Blood transfusion   . Cataract   . Diverticulosis   . Esophageal stricture   . Gastroparesis   . GERD (gastroesophageal reflux disease)   . Hyperlipidemia   . Hypertension   . Hypothyroidism   . Internal hemorrhoid   . Iron deficiency anemia   . Osteoporosis   . PONV (postoperative nausea and vomiting)   . PVD (peripheral vascular disease) ([place])   . Stroke [facility])    TIA  . TIA (transient ischemic attack)     Patient Active Problem List   Diagnosis Date Noted  . PCP NOTES >>> 05/21/2015  . Annual physical exam 12/12/2014  . Chest pain 12/19/2012  . Granuloma RUL resected 03/14/13  12/19/2012  . FACIAL PAIN 12/12/2009  . ESOPHAGEAL STRICTURE 11/26/2009  . DYSPHAGIA 09/17/2009  . Family  history of malignant neoplasm of gastrointestinal tract 09/17/2009  . Cough variant asthma 09/11/2009  . PERSONAL HX COLONIC POLYPS 12/25/2008  . COUGH 07/24/2008  . Dyspnea 03/17/2008  . PALPITATIONS, RECURRENT 02/02/2008  . UNS ADVRS EFF UNS RX MEDICINAL&BIOLOGICAL SBSTNC 02/02/2008  . ADENOMATOUS COLONIC POLYP 12/23/2007  . GERD 12/23/2007  . GASTROPARESIS 12/23/2007  . Hypothyroidism 03/12/2007  . Hyperlipemia 03/12/2007  . HTN (hypertension) 03/12/2007  . CAD (coronary artery disease) 03/12/2007  . INTERNAL HEMORRHOIDS 06/01/2006  . DIVERTICULOSIS, COLON 06/01/2006    Past Surgical History:  Procedure Laterality Date  . ABDOMINAL HYSTERECTOMY    . APPENDECTOMY    . CERVICAL FUSION    . LEFT HEART CATHETERIZATION WITH CORONARY ANGIOGRAM N/A 12/20/2012   Procedure: LEFT HEART CATHETERIZATION WITH CORONARY ANGIOGRAM;  Surgeon: Peter M Martinique, MD;  Location[facility] C[facility] LAB;  Service: Cardiovascular;  Laterality: N/A;  . shoulder surgery     . VIDEO ASSISTED THORACOSCOPY (VATS)/WEDGE RESECTION Right 03/14/2013   Procedure: VIDEO ASSISTED THORACOSCOPY (VATS)/WEDGE RESECTION;  Surgeon: Melrose Nakayama, MD;  Location: [place];  Service: Thoracic;  Laterality: Right;    OB History    No data available       Home Medications    Prior to Admission medications   Medication Sig Start Date End Date Taking? Authorizing Provider  aspirin 81 MG tablet Take 81 mg by mouth daily.  Historical Provider, MD  CALCIUM PO Take 1 tablet by mouth 2 (two) times daily.    Historical Provider, MD  ciclopirox (PENLAC) 8 % solution Apply over nail and surrounding skin. Apply daily over previous coat. After seven (7) days, may remove with alcohol and continue cycle. 04/04/15   Colon Branch, MD  clopidogrel (PLAVIX) 75 MG tablet Take 1 tablet (75 mg total) by mouth daily. 04/07/16   Colon Branch, MD  Coenzyme Q10 (CO Q-10 PO) Take 1 tablet by mouth daily.    Historical Provider, MD  isosorbide mononitrate  (IMDUR) 30 MG 24 hr tablet Take 1 tablet (30 mg total) by mouth daily. 03/10/16   Colon Branch, MD  levothyroxine (SYNTHROID) 50 MCG tablet Take 1 tablet (50 mcg total) by mouth daily before breakfast. Criss Rosales only 12/13/15   Colon Branch, MD  losartan-hydrochlorothiazide [facility]) 100-12.5 MG tablet Take 0.5 tablets by mouth 2 (two) times daily. 02/19/16   Colon Branch, MD  magnesium 30 MG tablet Take 30 mg by mouth daily.      Historical Provider, MD  pantoprazole (PROTONIX) 40 MG tablet Take 1 tablet (40 mg total) by mouth daily before breakfast. 04/11/16   Colon Branch, MD  Probiotic Product (ALIGN) 4 MG CAPS Take 1 capsule by mouth daily. 07/14/11   Ladene Artist, MD  rosuvastatin (CRESTOR) 40 MG tablet Take 1 tablet (40 mg total) by mouth daily. 03/24/16   Colon Branch, MD  traMADol (ULTRAM) 50 MG tablet Take 1 tablet (50 mg total) by mouth 2 (two) times daily as needed. 12/13/15   Colon Branch, MD  vitamin D, CHOLECALCIFEROL, 400 UNITS tablet Take 400 Units by mouth daily.      Historical Provider, MD    Family History Family History  Problem Relation Age of Onset  . Diabetes Mother   . Heart attack Mother   . Heart attack Father   . Asthma Father   . Colon cancer Brother     at age 60  . Colon cancer Sister     at age 74  . Diabetes Maternal Aunt   . Diabetes Maternal Uncle   . Heart attack Sister   . Heart attack Brother   . Heart attack Brother   . Heart attack Brother   . Heart attack Brother   . Heart attack Sister   . Breast cancer Neg Hx     Social History Social History  Substance Use Topics  . Smoking status: Never Smoker  . Smokeless tobacco: Never Used  . Alcohol use No     Allergies   Clindamycin/lincomycin; Hydrocodone; Rofecoxib; and Latex   Review of Systems Review of Systems A complete 10 system review of systems was obtained and all systems are negative except as noted in the HPI and PMH.    Physical Exam Updated Vital Signs BP 154/75 (BP Location: Right Arm)    Temp 97.8 F (36.6 C) (Oral)   Resp 22   Ht 5\' 2"  (1.575 m)   Wt 149 lb (67.6 kg)   SpO2 100%   BMI 27.25 kg/m   Physical Exam  Constitutional: She is oriented to person, place, and time. She appears well-developed and well-nourished. No distress.  HENT:  Head: Normocephalic and atraumatic.  Nose: Nose normal.  Mouth/Throat: Oropharynx is clear and moist. No oropharyngeal exudate.  Eyes: Conjunctivae and EOM are normal. Pupils are equal, round, and reactive to light. No scleral icterus.  Neck: Normal  range of motion. Neck supple. No JVD present. No tracheal deviation present. No thyromegaly present.  Tenderness and swelling to the left SCM  Cardiovascular: Normal rate, regular rhythm and normal heart sounds.  Exam reveals no gallop and no friction rub.   No murmur heard. Pulmonary/Chest: Effort normal and breath sounds normal. No respiratory distress. She has no wheezes. She exhibits no tenderness.  Abdominal: Soft. Bowel sounds are normal. She exhibits no distension and no mass. There is no tenderness. There is no rebound and no guarding.  Musculoskeletal: Normal range of motion. She exhibits no edema or tenderness.  Lymphadenopathy:    She has no cervical adenopathy.  Neurological: She is alert and oriented to person, place, and time. No cranial nerve deficit. She exhibits normal muscle tone.  Skin: Skin is warm and dry. No rash noted. No erythema. No pallor.  Nursing note and vitals reviewed.    ED Treatments / Results  DIAGNOSTIC STUDIES: Oxygen Saturation is 100% on RA, normal by my interpretation.  COORDINATION OF CARE:  12:51 AM Discussed treatment plan with pt at bedside and pt agreed to plan.  Labs (all labs ordered are listed, but only abnormal results are displayed) Labs Reviewed  BASIC METABOLIC PANEL - Abnormal; Notable for the following:       Result Value   Sodium 132 (*)    Chloride 97 (*)    Glucose, Bld 109 (*)    Creatinine, Ser 1.05 (*)    GFR calc  non Af Amer 49 (*)    GFR calc Af Amer 57 (*)    All other components within normal limits  CBC WITH DIFFERENTIAL/PLATELET  TROPONIN I    EKG  EKG Interpretation  Date/Time:  Saturday May 03 2016 22:50:53 EDT Ventricular Rate:  75 PR Interval:  154 QRS Duration: 84 QT Interval:  388 QTC Calculation: 433 R Axis:   57 Text Interpretation:  Normal sinus rhythm Nonspecific T wave abnormality Abnormal ECG No significant change since last tracing Confirmed by Glynn Octave [phone]) on 05/04/2016 12:04:53 AM       Radiology Ct Soft Tissue Neck W Contrast  Result Date: 05/04/2016 CLINICAL DATA:  Acute onset sharp intermittent LEFT neck pain radiating to jaw 3 hours ago. LEFT neck swelling. Concurrent RIGHT upper tooth infection, on amoxicillin. History of esophageal stricture. EXAM: CT NECK WITH CONTRAST TECHNIQUE: Multidetector CT imaging of the neck was performed using the standard protocol following the bolus administration of intravenous contrast. CONTRAST:  169mL ISOVUE-300 IOPAMIDOL (ISOVUE-300) INJECTION 61% COMPARISON:  Head CTA February 17, 2013 FINDINGS: Pharynx and larynx: Normal. Salivary glands: Normal. Thyroid: Normal. Lymph nodes: No lymphadenopathy by CT size criteria. Vascular: Patulous RIGHT internal jugular vein. Mild calcific atherosclerosis of the carotid bulbs. Vertebral and carotid arteries are patent. Limited intracranial: Normal. Moderate calcific atherosclerosis of the carotid siphons. Visualized orbits: Normal. Mastoids and visualized paranasal sinuses: Mild bilateral maxillary sinus mucosal thickening. Mastoid air cells are well aerated. Small amount of soft tissue in the external auditory canals most compatible with cerumen. Skeleton: Severe bilateral temporomandibular osteoarthrosis. C5 through C7 solid bony fusion. No CT findings of dental pathology. Upper chest: Postsurgical changes RIGHT upper lobe. 4 cm ascending aorta, stable 2014. Other: Symmetric  appearance of the cervical muscles with particular attention to the sternocleidomastoid muscles. No effusion or fat stranding. IMPRESSION: No acute process in the neck. Stable 4 cm ascending aorta. Electronically Signed   By: Elon Alas M.D.   On: 05/04/2016 02:31  Procedures Procedures (including critical care time)  Medications Ordered in ED Medications  sodium chloride 0.9 % bolus 1,000 mL (1,000 mLs Intravenous New Bag/Given 05/04/16 0157)  ketorolac (TORADOL) 30 MG/ML injection 15 mg (15 mg Intravenous Given 05/04/16 0217)  iopamidol (ISOVUE-300) 61 % injection 100 mL (100 mLs Intravenous Contrast Given 05/04/16 0203)     Initial Impression / Assessment and Plan / ED Course  I have reviewed the triage vital signs and the nursing notes.  Pertinent labs & imaging results that were available during my care of the patient were reviewed by me and considered in my medical decision making (see chart for details).  Clinical Course    Patient presents emergency department for left-sided neck and jaw pain. EKG does not show any ischemia. Will send troponin for further evaluation. There is a no supple swelling of the soft tissue of her neck. This may be related to her recent dental procedure. Will obtain CT scan of the neck for evaluation. Patient given Toradol for pain control. We'll continue to closely monitor.  2:59 AM CT scan the neck is unremarkable as well. Patient continues to appear well in no acute distress. Swollen area in neck has not gotten any bigger throughout the night in the emergency department. Advised on Tylenol and ibuprofen at home. Primary care follow-up advised in 3 days for repeat evaluation. She appears well and in no acute distress, vital signs were within her normal limits and she is safe for discharge.   I personally performed the services described in this documentation, which was scribed in my presence. The recorded information has been reviewed and is  accurate.    Final Clinical Impressions(s) / ED Diagnoses   Final diagnoses:  None    New Prescriptions New Prescriptions   No medications on file     Everlene Balls, MD 05/04/16 0300

## 2016-06-06 ENCOUNTER — Other Ambulatory Visit: Payer: Self-pay | Admitting: Internal Medicine

## 2016-06-09 MED ORDER — TRAMADOL HCL 50 MG PO TABS: 50.0000 mg | ORAL_TABLET | Freq: Two times a day (BID) | ORAL | 0 refills | Status: DC | PRN

## 2016-06-09 NOTE — Telephone Encounter (Signed)
Rx faxed to [facility]  

## 2016-06-09 NOTE — Addendum Note (Signed)
Addended by: Damita Dunnings D on: 06/09/2016 02:55 PM   Modules accepted: Orders

## 2016-06-09 NOTE — Telephone Encounter (Signed)
Per [facility], they did not receive Tramadol Rx from 03/27/2016. Rx re-printed, awaiting MD signature.

## 2016-06-20 ENCOUNTER — Encounter: Payer: Self-pay | Admitting: Internal Medicine

## 2016-06-20 ENCOUNTER — Ambulatory Visit (INDEPENDENT_AMBULATORY_CARE_PROVIDER_SITE_OTHER): Payer: Medicare Other | Admitting: Internal Medicine

## 2016-06-20 VITALS — BP 108/54 | HR 77 | Temp 97.8°F | Ht 63.0 in | Wt 152.4 lb

## 2016-06-20 DIAGNOSIS — E7849 Other hyperlipidemia: Secondary | ICD-10-CM

## 2016-06-20 DIAGNOSIS — E039 Hypothyroidism, unspecified: Secondary | ICD-10-CM

## 2016-06-20 DIAGNOSIS — M810 Age-related osteoporosis without current pathological fracture: Secondary | ICD-10-CM | POA: Diagnosis not present

## 2016-06-20 DIAGNOSIS — E784 Other hyperlipidemia: Secondary | ICD-10-CM | POA: Diagnosis not present

## 2016-06-20 MED ORDER — LEVOTHYROXINE SODIUM 50 MCG PO TABS: 50.0000 ug | ORAL_TABLET | Freq: Every day | ORAL | 1 refills | Status: DC

## 2016-06-20 NOTE — Progress Notes (Signed)
Pre visit review using our clinic review tool, if applicable. No additional management support is needed unless otherwise documented below in the visit note. 

## 2016-06-20 NOTE — Patient Instructions (Signed)
Go to the lab for a UDS  GO TO THE FRONT DESK Schedule your next appointment for a physical by 12-2016

## 2016-06-20 NOTE — Progress Notes (Signed)
Subjective:    Patient ID: Kathleen Thompson, female    DOB: [birth year], 82 y.o.   MRN: [id number]  DOS:  06/20/2016 Type of visit - description : Routine checkup Interval history: Osteoporosis: Likes to discuss results of bone density test DJD: On tramadol, most of the pain is around the knees Hyperlipidemia: Had to decrease Crestor to half dose.  Review of Systems   Past Medical History:  Diagnosis Date  . Adenomatous colon polyp 02/2003  . Arthritis   . Blood transfusion   . Cataract   . Diverticulosis   . Esophageal stricture   . Gastroparesis   . GERD (gastroesophageal reflux disease)   . Hyperlipidemia   . Hypertension   . Hypothyroidism   . Internal hemorrhoid   . Iron deficiency anemia   . Osteoporosis   . PONV (postoperative nausea and vomiting)   . PVD (peripheral vascular disease) ([place])   . Stroke [facility])    TIA  . TIA (transient ischemic attack)     Past Surgical History:  Procedure Laterality Date  . ABDOMINAL HYSTERECTOMY    . APPENDECTOMY    . CERVICAL FUSION    . LEFT HEART CATHETERIZATION WITH CORONARY ANGIOGRAM N/A 12/20/2012   Procedure: LEFT HEART CATHETERIZATION WITH CORONARY ANGIOGRAM;  Surgeon: Peter M Martinique, MD;  Location: [facility] CATH LAB;  Service: Cardiovascular;  Laterality: N/A;  . shoulder surgery     . VIDEO ASSISTED THORACOSCOPY (VATS)/WEDGE RESECTION Right 03/14/2013   Procedure: VIDEO ASSISTED THORACOSCOPY (VATS)/WEDGE RESECTION;  Surgeon: Melrose Nakayama, MD;  Location: [place];  Service: Thoracic;  Laterality: Right;    Social History   Social History  . Marital status: Married    Spouse name: N/A  . Number of children: 2  . Years of education: N/A   Occupational History  . Retired-[employer]  Retired   Social History Main Topics  . Smoking status: Never Smoker  . Smokeless tobacco: Never Used  . Alcohol use No  . Drug use: No  . Sexual activity: Yes   Other Topics Concern  . Not on file   Social History Narrative   Household-- pt and wife   2 boys , one in [place], on in [facility]         Medication List       Accurate as of 06/20/16 11:59 PM. Always use your most recent med list.          ALIGN 4 MG Caps Take 1 capsule by mouth daily.   aspirin 81 MG tablet Take 81 mg by mouth daily.   CALCIUM PO Take 1 tablet by mouth 2 (two) times daily.   ciclopirox 8 % solution Commonly known as:  PENLAC Apply over nail and surrounding skin. Apply daily over previous coat. After seven (7) days, may remove with alcohol and continue cycle.   clopidogrel 75 MG tablet Commonly known as:  PLAVIX Take 1 tablet (75 mg total) by mouth daily.   CO Q-10 PO Take 1 tablet by mouth daily.   isosorbide mononitrate 30 MG 24 hr tablet Commonly known as:  IMDUR Take 1 tablet (30 mg total) by mouth daily.   levothyroxine 50 MCG tablet Commonly known as:  SYNTHROID Take 1 tablet (50 mcg total) by mouth daily before breakfast. Criss Rosales only   losartan-hydrochlorothiazide 100-12.5 MG tablet Commonly known as:  HYZAAR Take 0.5 tablets by mouth 2 (two) times daily.   magnesium 30 MG tablet Take 30 mg by mouth daily.  pantoprazole 40 MG tablet Commonly known as:  PROTONIX Take 1 tablet (40 mg total) by mouth daily before breakfast.   rosuvastatin 40 MG tablet Commonly known as:  CRESTOR Take 20 mg by mouth daily.   traMADol 50 MG tablet Commonly known as:  ULTRAM Take 1 tablet (50 mg total) by mouth 2 (two) times daily as needed.   vitamin D (CHOLECALCIFEROL) 400 units tablet Take 400 Units by mouth daily.          Objective:   Physical Exam BP (!) 108/54 (BP Location: Left Arm, Patient Position: Sitting, Cuff Size: Normal)   Pulse 77   Temp 97.8 F (36.6 C) (Oral)   Ht 5\' 3"  (1.6 m)   Wt 152 lb 6.4 oz (69.1 kg)   SpO2 99% Comment: RA  BMI 27.00 kg/m  General:   Well developed, well nourished . NAD.  HEENT:  Normocephalic . Face symmetric, atraumatic Lungs:  CTA B Normal respiratory effort,  no intercostal retractions, no accessory muscle use. Heart: RRR,  no murmur.  No pretibial edema bilaterally  Skin: Not pale. Not jaundice Neurologic:  alert & oriented X3.  Speech normal, gait appropriate for age and unassisted Psych--  Cognition and judgment appear intact.  Cooperative with normal attention span and concentration.  Behavior appropriate. No anxious or depressed appearing.      Assessment & Plan:  Assessment> HTN Hypothyroidism Hyperlipidemia CAD cath 12/2012 single-vessel CAD,rx CV RF control, on Plavix, imdur ?TIA remotely on plavix per previous PCP Osteoporosis -  DEXAs @ [facility], T score -3.9 (12/2015). MSK: Dr Lorin Mercy --DJD- s/p neck and shoulder surgeries  -- on tramadol ,UDS no murmurs 05-2015  GI: GED, gastroparesis, diverticulosis, esophageal stricture H/o Iron deficiency anemia H/o granuloma RUL, resected 03-14-13   PLAN: Hyperlipidemia: Last FLP on Crestor 40 mg once excellent, she self decreased to 20 mg because she was feeling tired. Some aches. Feeling better. Will recheck a FLP when she comes back to see how she's doing on half dose. hypothyroidism: Started branded Synthroid,feeling great Osteoporosis: T score 12/2015 -3.9, reluctant to get any rx b/c people she knows had bad experiences;  extensive discussion about treatment, pros and cons ; Reclast vs prolia pro-cons; she remains skeptical . Will call me when ready to proceed. DJD: Continue tramadol, check a UDS. RTC 12/2016, CPX.

## 2016-06-22 DIAGNOSIS — M810 Age-related osteoporosis without current pathological fracture: Secondary | ICD-10-CM | POA: Insufficient documentation

## 2016-06-22 NOTE — Assessment & Plan Note (Signed)
PLAN: Hyperlipidemia: Last FLP on Crestor 40 mg once excellent, she self decreased to 20 mg because she was feeling tired. Some aches. Feeling better. Will recheck a FLP when she comes back to see how she's doing on half dose. hypothyroidism: Started branded Synthroid,feeling great Osteoporosis: T score 12/2015 -3.9, reluctant to get any rx b/c people she knows had bad experiences;  extensive discussion about treatment, pros and cons ; Reclast vs prolia pro-cons; she remains skeptical . Will call me when ready to proceed. DJD: Continue tramadol, check a UDS. RTC 12/2016, CPX.

## 2016-07-02 ENCOUNTER — Encounter: Payer: Self-pay | Admitting: Gastroenterology

## 2016-07-08 ENCOUNTER — Telehealth: Payer: Self-pay

## 2016-07-08 NOTE — Telephone Encounter (Signed)
Received fax confirmation on 07/08/2016 at 0902.

## 2016-07-08 NOTE — Telephone Encounter (Signed)
Surgical Clearance form from [employer] received and completed. Per PCP, Pt cleared for surgery. OV notes from 06/20/2016 and EKG from 05/03/2016 faxed w/ form to [phone]. Forms sent for scanning.

## 2016-07-17 ENCOUNTER — Telehealth: Payer: Self-pay | Admitting: Internal Medicine

## 2016-07-17 NOTE — Telephone Encounter (Signed)
Did Pt decide to proceed w/ Prolia?

## 2016-07-17 NOTE — Telephone Encounter (Signed)
Prolia benefits verified No PA required $50 Prolia $20 admin fee Patient will owe approximately $70 out of pocket  Inform patient of benefits before we proceed with ordering injection

## 2016-07-18 NOTE — Telephone Encounter (Signed)
She said will call if interested. No need to start the process

## 2016-07-21 ENCOUNTER — Telehealth: Payer: Self-pay

## 2016-07-21 NOTE — Telephone Encounter (Signed)
UDS: 06/20/2016  Positive for Tramadol   Low risk per PCP 07/21/2016

## 2016-07-22 ENCOUNTER — Telehealth: Payer: Self-pay | Admitting: Internal Medicine

## 2016-07-22 NOTE — Telephone Encounter (Signed)
Caller name: Relationship to patient: Self Can be reached: [phone] Pharmacy:  Reason for call: Patient has decided to have the Prolia and wishes to come in next week to start. Plse adv.

## 2016-07-23 NOTE — Telephone Encounter (Signed)
Do we have a Prolia ordered for this Pt?

## 2016-07-25 NOTE — Telephone Encounter (Signed)
Prolia ordered today

## 2016-07-28 NOTE — Telephone Encounter (Signed)
Denise-we have received Prolia for Pt. Please call and schedule nurse visit at her convenience. Thank you.

## 2016-07-28 NOTE — Telephone Encounter (Signed)
Prolia has been received and is in fridge for pt. 

## 2016-07-29 NOTE — Telephone Encounter (Signed)
Patient scheduled.

## 2016-07-30 ENCOUNTER — Telehealth: Payer: Self-pay

## 2016-07-30 ENCOUNTER — Ambulatory Visit (INDEPENDENT_AMBULATORY_CARE_PROVIDER_SITE_OTHER): Payer: Medicare Other

## 2016-07-30 DIAGNOSIS — M8000XA Age-related osteoporosis with current pathological fracture, unspecified site, initial encounter for fracture: Secondary | ICD-10-CM

## 2016-07-30 MED ORDER — DENOSUMAB 60 MG/ML ~~LOC~~ SOLN
60.0000 mg | Freq: Once | SUBCUTANEOUS | Status: AC
  Administered 2016-07-30 (×2): 60 mg via SUBCUTANEOUS

## 2016-07-30 NOTE — Progress Notes (Signed)
Pre visit review using our clinic tool,if applicable. No additional management support is needed unless otherwise documented below in the visit note.   Patient in for first Prolia injection per order from Dr. Larose Kells. See note dated 06/20/2016.  Given 60 mg SQ left arn. Patient tolerated well.  Nurse will call patient when next injection due.

## 2016-07-30 NOTE — Telephone Encounter (Signed)
Yes, please update the medication list

## 2016-07-30 NOTE — Telephone Encounter (Signed)
Patient in for Prolia injection today. Would like to know if she could reduce her Crestor 40 mg down to 20 mg daily. states she cannot tolerage Crestor 40 mg. Please advise.

## 2016-07-31 ENCOUNTER — Other Ambulatory Visit: Payer: Self-pay

## 2016-07-31 MED ORDER — ROSUVASTATIN CALCIUM 20 MG PO TABS: 20.0000 mg | ORAL_TABLET | Freq: Every day | ORAL | 3 refills | Status: DC

## 2016-07-31 NOTE — Telephone Encounter (Signed)
Medication list updated, patient notified.

## 2016-08-11 HISTORY — PX: EYE SURGERY: SHX253

## 2016-09-07 ENCOUNTER — Other Ambulatory Visit: Payer: Self-pay | Admitting: Internal Medicine

## 2016-09-08 NOTE — Telephone Encounter (Signed)
Rx printed, awaiting MD signature.  

## 2016-09-08 NOTE — Telephone Encounter (Signed)
180, no RF

## 2016-09-08 NOTE — Telephone Encounter (Signed)
Pt is requesting refill on Tramadol.  Last OV: 06/20/2016  Last Fill: 06/09/2016 #180 and 0RF UDS: 06/20/2016 Low risk  Please advise.

## 2016-09-08 NOTE — Telephone Encounter (Signed)
Rx faxed to [facility]  

## 2016-10-08 ENCOUNTER — Other Ambulatory Visit: Payer: Self-pay | Admitting: Internal Medicine

## 2016-11-17 ENCOUNTER — Encounter: Payer: Self-pay | Admitting: Internal Medicine

## 2016-11-17 ENCOUNTER — Ambulatory Visit (INDEPENDENT_AMBULATORY_CARE_PROVIDER_SITE_OTHER): Payer: Medicare Other | Admitting: Internal Medicine

## 2016-11-17 VITALS — BP 124/76 | HR 77 | Temp 97.7°F | Resp 14 | Ht 63.0 in | Wt 150.5 lb

## 2016-11-17 DIAGNOSIS — I2510 Atherosclerotic heart disease of native coronary artery without angina pectoris: Secondary | ICD-10-CM | POA: Diagnosis not present

## 2016-11-17 DIAGNOSIS — G453 Amaurosis fugax: Secondary | ICD-10-CM | POA: Diagnosis not present

## 2016-11-17 DIAGNOSIS — M542 Cervicalgia: Secondary | ICD-10-CM | POA: Diagnosis not present

## 2016-11-17 DIAGNOSIS — M810 Age-related osteoporosis without current pathological fracture: Secondary | ICD-10-CM

## 2016-11-17 NOTE — Progress Notes (Signed)
Subjective:    Patient ID: Kathleen Thompson, female    DOB: Oct 26, 1933, 83 y.o.   MRN: [id number]  DOS:  11/17/2016 Type of visit - description : Acute Interval history: CC  today is neck pain, started in January 2018, located at the left-lateral side, pain is steady, worse with certain movements. It radiates somewhat up to the head and down to the shoulder. Had a fall in the summer of 2017 but no major injury. The have cataract surgery and a rooth  canal before the symptoms started and wonders if keeping her head in the same position for a while has to do w/ her sx.   We also talked about the combination of aspirin and Plavix.  ifReview of Systems  no chest pain, difficulty breathing No diplopia, slurred speech, facial deficits.   Past Medical History:  Diagnosis Date  . Adenomatous colon polyp 02/2003  . Arthritis   . Blood transfusion   . Cataract   . Diverticulosis   . Esophageal stricture   . Gastroparesis   . GERD (gastroesophageal reflux disease)   . Hyperlipidemia   . Hypertension   . Hypothyroidism   . Internal hemorrhoid   . Iron deficiency anemia   . Osteoporosis   . PONV (postoperative nausea and vomiting)   . PVD (peripheral vascular disease) ([place])   . Stroke [facility])    TIA  . TIA (transient ischemic attack)     Past Surgical History:  Procedure Laterality Date  . ABDOMINAL HYSTERECTOMY    . APPENDECTOMY    . CERVICAL FUSION    . EYE SURGERY Bilateral    cataracts  . LEFT HEART CATHETERIZATION WITH CORONARY ANGIOGRAM N/A 12/20/2012   Procedure: LEFT HEART CATHETERIZATION WITH CORONARY ANGIOGRAM;  Surgeon: Peter M Martinique, MD;  Location: [facility] CATH LAB;  Service: Cardiovascular;  Laterality: N/A;  . shoulder surgery     . VIDEO ASSISTED THORACOSCOPY (VATS)/WEDGE RESECTION Right 03/14/2013   Procedure: VIDEO ASSISTED THORACOSCOPY (VATS)/WEDGE RESECTION;  Surgeon: Melrose Nakayama, MD;  Location: [place];  Service: Thoracic;  Laterality: Right;    Social  History   Social History  . Marital status: Married    Spouse name: N/A  . Number of children: 2  . Years of education: N/A   Occupational History  . Retired-[employer]  Retired   Social History Main Topics  . Smoking status: Never Smoker  . Smokeless tobacco: Never Used  . Alcohol use No  . Drug use: No  . Sexual activity: Yes   Other Topics Concern  . Not on file   Social History Narrative   Household-- pt and wife   2 boys , one in [place], on in [place] as of 11/17/2016      Reactions   Clindamycin/lincomycin Rash   Hydrocodone Hives   Rofecoxib    hives   Latex Rash   Pt states that latex causes blisters to skin      Medication List       Accurate as of 11/17/16  2:06 PM. Always use your most recent med list.          ALIGN 4 MG Caps Take 1 capsule by mouth daily.   aspirin 81 MG tablet Take 81 mg by mouth daily.   CALCIUM PO Take 1 tablet by mouth 2 (two) times daily.   ciclopirox 8 % solution Commonly known as:  PENLAC Apply over nail and surrounding skin. Apply daily  over previous coat. After seven (7) days, may remove with alcohol and continue cycle.   clopidogrel 75 MG tablet Commonly known as:  PLAVIX Take 1 tablet (75 mg total) by mouth daily.   CO Q-10 PO Take 1 tablet by mouth daily.   isosorbide mononitrate 30 MG 24 hr tablet Commonly known as:  IMDUR Take 1 tablet (30 mg total) by mouth daily.   levothyroxine 50 MCG tablet Commonly known as:  SYNTHROID Take 1 tablet (50 mcg total) by mouth daily before breakfast. Criss Rosales only   losartan-hydrochlorothiazide 100-12.5 MG tablet Commonly known as:  HYZAAR Take 0.5 tablets by mouth 2 (two) times daily.   magnesium 30 MG tablet Take 30 mg by mouth daily.   pantoprazole 40 MG tablet Commonly known as:  PROTONIX Take 1 tablet (40 mg total) by mouth daily before breakfast.   rosuvastatin 20 MG tablet Commonly known as:  CRESTOR Take 1 tablet (20 mg total) by mouth daily.     traMADol 50 MG tablet Commonly known as:  ULTRAM Take 1 tablet (50 mg total) by mouth 2 (two) times daily as needed.   vitamin D (CHOLECALCIFEROL) 400 units tablet Take 400 Units by mouth daily.          Objective:   Physical Exam  Neck:     BP 124/76 (BP Location: Left Arm, Patient Position: Sitting, Cuff Size: Small)   Pulse 77   Temp 97.7 F (36.5 C) (Oral)   Resp 14   Ht 5\' 3"  (1.6 m)   Wt 150 lb 8 oz (68.3 kg)   SpO2 97%   BMI 26.66 kg/m  General:   Well developed, well nourished . NAD.  HEENT:  Normocephalic . Face symmetric, atraumatic Neck: Range of motion satisfactory. Carotid pulses normal. No thyromegaly Lungs:  CTA B Normal respiratory effort, no intercostal retractions, no accessory muscle use. Heart: RRR,  no murmur.  No pretibial edema bilaterally  Skin: Not pale. Not jaundice Neurologic:  alert & oriented X3.  Speech normal, gait appropriate for age and unassisted Motor exam symmetric, coordination normal, DTRs symmetric except for slight increase left knee jerk Psych--  Cognition and judgment appear intact.  Cooperative with normal attention span and concentration.  Behavior appropriate. No anxious or depressed appearing.      Assessment & Plan:   Assessment> HTN Hypothyroidism Hyperlipidemia CV: --CAD cath 12/2012 single-vessel CAD,rx CV RF control, on Plavix, imdur --TIA remotely , apparently amaurosis fugax, was a started on on plavix per PCP, no further sx since. --On ASA and Plavix: Discuss 11/17/2016 Osteoporosis -  DEXAs @ [facility], T score -3.9 (12/2015).Prolia #1:  07/2016 MSK: Dr Lorin Mercy --DJD- s/p neck and shoulder surgeries  --knee pain-- on tramadol  GI: GED, gastroparesis, diverticulosis, esophageal stricture H/o Iron deficiency anemia H/o granuloma RUL, resected 03-14-13   PLAN: Neck pain: As described above, likely has a radiculopathy. We discussed a MRI, prednisone taper but she is quite hesitant to proceed with that.  We  agreed to continue with tramadol, warm compress and referr to Dr. Lorin Mercy. If something changes she is to let me know CAD, TIA: Taking dual antiplatelet with aspirin and Plavix for many years; remotely (no records)  Plavix was  started due to amaurosis fugax, since then she become asx. Pro-cons of double antiplatelet therapy discussed with the patient, giving history we elected to stay on the same.  Osteoporosis: Started prolia 07/2016. RTC next month for CPX

## 2016-11-17 NOTE — Patient Instructions (Signed)
See you next month  Call if something changes or gets worse

## 2016-11-17 NOTE — Progress Notes (Signed)
Pre visit review using our clinic review tool, if applicable. No additional management support is needed unless otherwise documented below in the visit note. 

## 2016-11-18 DIAGNOSIS — G459 Transient cerebral ischemic attack, unspecified: Secondary | ICD-10-CM | POA: Insufficient documentation

## 2016-11-18 NOTE — Assessment & Plan Note (Signed)
Neck pain: As described above, likely has a radiculopathy. We discussed a MRI, prednisone taper but she is quite hesitant to proceed with that. We  agreed to continue with tramadol, warm compress and referr to Dr. Lorin Mercy. If something changes she is to let me know CAD, TIA: Taking dual antiplatelet with aspirin and Plavix for many years; remotely (no records)  Plavix was  started due to amaurosis fugax, since then she become asx. Pro-cons of double antiplatelet therapy discussed with the patient, giving history we elected to stay on the same.  Osteoporosis: Started prolia 07/2016. RTC next month for CPX

## 2016-11-20 ENCOUNTER — Telehealth: Payer: Self-pay | Admitting: Internal Medicine

## 2016-11-20 ENCOUNTER — Other Ambulatory Visit: Payer: Self-pay | Admitting: Internal Medicine

## 2016-11-20 MED ORDER — LOSARTAN POTASSIUM-HCTZ 100-12.5 MG PO TABS: 0.5000 | ORAL_TABLET | Freq: Two times a day (BID) | ORAL | 2 refills | Status: DC

## 2016-11-20 NOTE — Telephone Encounter (Signed)
Rx sent 

## 2016-11-20 NOTE — Telephone Encounter (Signed)
[place], [place] [phone] (Phone) [phone] (Fax)   Pharmacy called stating patient is completely out requesting a refill losartan-hydrochlorothiazide (HYZAAR) 100-12.5 MG tablet today,please advise

## 2016-12-02 ENCOUNTER — Encounter (INDEPENDENT_AMBULATORY_CARE_PROVIDER_SITE_OTHER): Payer: Self-pay | Admitting: Orthopaedic Surgery

## 2016-12-02 ENCOUNTER — Ambulatory Visit (INDEPENDENT_AMBULATORY_CARE_PROVIDER_SITE_OTHER): Payer: Medicare Other

## 2016-12-02 ENCOUNTER — Ambulatory Visit (INDEPENDENT_AMBULATORY_CARE_PROVIDER_SITE_OTHER): Payer: Medicare Other | Admitting: Orthopaedic Surgery

## 2016-12-02 ENCOUNTER — Ambulatory Visit (INDEPENDENT_AMBULATORY_CARE_PROVIDER_SITE_OTHER): Payer: Self-pay | Admitting: Orthopaedic Surgery

## 2016-12-02 VITALS — BP 131/75 | HR 87 | Ht 63.0 in | Wt 150.0 lb

## 2016-12-02 DIAGNOSIS — M25552 Pain in left hip: Secondary | ICD-10-CM

## 2016-12-02 DIAGNOSIS — M542 Cervicalgia: Secondary | ICD-10-CM

## 2016-12-02 DIAGNOSIS — M7062 Trochanteric bursitis, left hip: Secondary | ICD-10-CM

## 2016-12-02 MED ORDER — METHYLPREDNISOLONE ACETATE 40 MG/ML IJ SUSP
40.0000 mg | INTRAMUSCULAR | Status: AC | PRN
  Administered 2016-12-02: 40 mg via INTRA_ARTICULAR

## 2016-12-02 MED ORDER — BUPIVACAINE HCL 0.5 % IJ SOLN
2.0000 mL | INTRAMUSCULAR | Status: AC | PRN
  Administered 2016-12-02: 2 mL via INTRA_ARTICULAR

## 2016-12-02 MED ORDER — DIAZEPAM 5 MG PO TABS: ORAL_TABLET | ORAL | 0 refills | Status: DC

## 2016-12-02 NOTE — Addendum Note (Signed)
Addended by: Meyer Cory on: 12/02/2016 03:35 PM   Modules accepted: Orders

## 2016-12-02 NOTE — Progress Notes (Signed)
Office Visit Note   Patient: Kathleen Thompson           Date of Birth: 1934-05-06           MRN: [id number] Visit Date: 12/02/2016              Requested by: Colon Branch, MD [address] [place], [place] [address] PCP: Kathlene November, MD   Assessment & Plan: Visit Diagnoses:  1. Neck pain   2. Pain in left hip   3. Trochanteric bursitis, left hip     Plan: Trochanteric bursa injection performed left. She tolerated this well. We will proceed with cervical MRI scan she can return after scan for review. Valium prescribed for claustrophobia before her MRI. Follow-Up Instructions: Follow-up after cervical MRI. On return visit we will obtain standing AP of both knees and lateral of the right and left knee with bilateral sunrise x-ray  Orders:  Orders Placed This Encounter  Procedures  . Large Joint Injection/Arthrocentesis  . XR HIP UNILAT W OR W/O PELVIS 2-3 VIEWS LEFT  . XR Cervical Spine 2 or 3 views   No orders of the defined types were placed in this encounter.     Procedures: Large Joint Inj Date/Time: 12/02/2016 2:15 PM Performed by: Marybelle Killings Authorized by: Rodell Perna C   Location:  Hip Site:  L greater trochanter Approach:  Lateral Ultrasound Guidance: No   Fluoroscopic Guidance: No   Arthrogram: No   Medications:  2 mL bupivacaine 0.5 %; 40 mg methylPREDNISolone acetate 40 MG/ML Aspiration Attempted: No       Clinical Data: No additional findings.   Subjective: Chief Complaint  Patient presents with  . Neck - Pain  . Right Knee - Pain  . Left Knee - Pain  . Left Hip - Pain    HPI patients having increased problems with her neck that radiates in her shoulder she had cataract surgery several months ago her neck pain is been significantly worse. Previous C5-6, C6-7 Cloward fusion which is healed solidly. This was 23 years ago and she states just since January neck started hurting her again. It radiates in her shoulder sometimes down to her  wrist. She still has problems with the right first dorsal compartment tenosynovitis in the still wearing a compressive brace. Knees are giving her problems with walking particularly with bending and she is having severe pain with walking in her left lateral hip and points directly greater trochanter. She denies chills or fever. Some grinding and popping in her knees particularly with flexion she can get off the floor when she gets down on it without someone pulling her up.  Review of Systems 40 point  systems updated positive for history coronary artery disease, hypertension, hyperlipidemia, hypothyroidism, previous cervical fusion C5-C7, history of TIA. Osteoporosis. Bilateral knee pain otherwise negative for history of present illness.   Objective: Vital Signs: BP 131/75   Pulse 87   Ht 5\' 3"  (1.6 m)   Wt 150 lb (68 kg)   BMI 26.57 kg/m   Physical Exam  Constitutional: She is oriented to person, place, and time. She appears well-developed.  HENT:  Head: Normocephalic.  Right Ear: External ear normal.  Left Ear: External ear normal.  Eyes: Pupils are equal, round, and reactive to light.  Neck: No tracheal deviation present. No thyromegaly present.  Cardiovascular: Normal rate.   Pulmonary/Chest: Effort normal.  Abdominal: Soft.  Musculoskeletal:  Healed anterior cervical incision. She  has bilateral brachioplexus tenderness. Upper 70 reflexes are 2+ and symmetrical. No shoulder impingement. Before meals joint is normal. Exquisite tenderness over the left greater trochanter. Right first dorsal compartment as the scope was easily tender with positive Finkelstein test. No thenar or hyperthenar atrophy. Crepitus with knee extension and flexion of both knees to 90. Trace edema of the lower extremities. Distal pulses are intact. No venous stasis changes. Exquisite tenderness over the left lateral trochanter, negative straight leg raising 90 negative right trochanteric tenderness. Mild sinus  tenderness on the left vein on the right.  Neurological: She is alert and oriented to person, place, and time.  Skin: Skin is warm and dry.  Psychiatric: She has a normal mood and affect. Her behavior is normal.    Ortho Exam  Specialty Comments:  No specialty comments available.  Imaging: Xr Hip Unilat W Or W/o Pelvis 2-3 Views Left  Result Date: 12/02/2016 X-rays AP pelvis and hips demonstrate well-maintained hip space joint. Negative for fracture. Post Cloward anterior iliac bone harvest site changes. No acute fracture. Femurs are normal. Impression: Post bone graft left anterior iliac otherwise normal pelvis and hip x-rays  Xr Cervical Spine 2 Or 3 Views  Result Date: 12/02/2016 AP lateral cervical spine shows previous Cloward fusion at C5-6 and  C6-7. There is good bone growth. Mild narrowing at the level below the fusion. C4-5 above shows slight calcification of the anterior longitudinal ligament but well-maintained disc space height. Impression: Post 2 level cervical fusion without plate. Solid fusion with minimal adjacent level narrowing below the fusion    PMFS History: Patient Active Problem List   Diagnosis Date Noted  . Trochanteric bursitis, left hip 12/02/2016  . h/o TIA   11/18/2016  . Osteoporosis 06/22/2016  . PCP NOTES >>> 05/21/2015  . Annual physical exam 12/12/2014  . Granuloma RUL resected 03/14/13  12/19/2012  . ESOPHAGEAL STRICTURE 11/26/2009  . Family history of malignant neoplasm of gastrointestinal tract 09/17/2009  . Cough variant asthma 09/11/2009  . PERSONAL HX COLONIC POLYPS 12/25/2008  . Dyspnea 03/17/2008  . PALPITATIONS, RECURRENT 02/02/2008  . ADENOMATOUS COLONIC POLYP 12/23/2007  . GERD 12/23/2007  . GASTROPARESIS 12/23/2007  . Hypothyroidism 03/12/2007  . Hyperlipemia 03/12/2007  . HTN (hypertension) 03/12/2007  . CAD (coronary artery disease) 03/12/2007  . INTERNAL HEMORRHOIDS 06/01/2006  . DIVERTICULOSIS, COLON 06/01/2006   Past  Medical History:  Diagnosis Date  . Adenomatous colon polyp 02/2003  . Arthritis   . Blood transfusion   . Cataract   . Diverticulosis   . Esophageal stricture   . Gastroparesis   . GERD (gastroesophageal reflux disease)   . Hyperlipidemia   . Hypertension   . Hypothyroidism   . Internal hemorrhoid   . Iron deficiency anemia   . Osteoporosis   . PONV (postoperative nausea and vomiting)   . PVD (peripheral vascular disease) ([place])   . Stroke [facility])    TIA  . TIA (transient ischemic attack)     Family History  Problem Relation Age of Onset  . Diabetes Mother   . Heart attack Mother   . Heart attack Father   . Asthma Father   . Colon cancer Brother     at age 19  . Colon cancer Sister     at age 6  . Diabetes Maternal Aunt   . Diabetes Maternal Uncle   . Heart attack Sister   . Heart attack Brother   . Heart attack Brother   . Heart attack  Brother   . Heart attack Brother   . Heart attack Sister   . Breast cancer Neg Hx     Past Surgical History:  Procedure Laterality Date  . ABDOMINAL HYSTERECTOMY    . APPENDECTOMY    . CERVICAL FUSION    . EYE SURGERY Bilateral    cataracts  . LEFT HEART CATHETERIZATION WITH CORONARY ANGIOGRAM N/A 12/20/2012   Procedure: LEFT HEART CATHETERIZATION WITH CORONARY ANGIOGRAM;  Surgeon: Peter M Martinique, MD;  Location: [facility] CATH LAB;  Service: Cardiovascular;  Laterality: N/A;  . shoulder surgery     . VIDEO ASSISTED THORACOSCOPY (VATS)/WEDGE RESECTION Right 03/14/2013   Procedure: VIDEO ASSISTED THORACOSCOPY (VATS)/WEDGE RESECTION;  Surgeon: Melrose Nakayama, MD;  Location: [place];  Service: Thoracic;  Laterality: Right;   Social History   Occupational History  . Retired-[employer]  Retired   Social History Main Topics  . Smoking status: Never Smoker  . Smokeless tobacco: Never Used  . Alcohol use No  . Drug use: No  . Sexual activity: Yes

## 2016-12-03 ENCOUNTER — Other Ambulatory Visit: Payer: Self-pay | Admitting: Internal Medicine

## 2016-12-04 NOTE — Telephone Encounter (Signed)
Rx faxed to [facility]  

## 2016-12-04 NOTE — Telephone Encounter (Signed)
Pt is requesting refill on Tramadol.  Last OV: 11/17/2016 Last Fill: 09/08/2016 #180 and 0RF UDS: 06/20/2016 Low risk  Please advise.

## 2016-12-04 NOTE — Telephone Encounter (Signed)
Rx printed, awaiting MD signature.  

## 2016-12-04 NOTE — Telephone Encounter (Signed)
Okay #180, no refills 

## 2016-12-16 ENCOUNTER — Ambulatory Visit
Admission: RE | Admit: 2016-12-16 | Discharge: 2016-12-16 | Disposition: A | Discharge status: Home / Self Care | Payer: Medicare Other | Source: Ambulatory Visit | Attending: Orthopaedic Surgery | Admitting: Orthopaedic Surgery

## 2016-12-16 DIAGNOSIS — M542 Cervicalgia: Secondary | ICD-10-CM

## 2016-12-18 ENCOUNTER — Telehealth: Payer: Self-pay

## 2016-12-18 ENCOUNTER — Encounter: Payer: Self-pay | Admitting: Internal Medicine

## 2016-12-18 ENCOUNTER — Ambulatory Visit (INDEPENDENT_AMBULATORY_CARE_PROVIDER_SITE_OTHER): Payer: Medicare Other | Admitting: Internal Medicine

## 2016-12-18 VITALS — BP 124/78 | HR 75 | Temp 97.8°F | Resp 14 | Ht 63.0 in | Wt 148.5 lb

## 2016-12-18 DIAGNOSIS — Z0000 Encounter for general adult medical examination without abnormal findings: Secondary | ICD-10-CM | POA: Diagnosis not present

## 2016-12-18 LAB — COMPREHENSIVE METABOLIC PANEL
ALBUMIN: 4.6 g/dL (ref 3.5–5.2)
ALK PHOS: 62 U/L (ref 39–117)
ALT: 14 U/L (ref 0–35)
AST: 20 U/L (ref 0–37)
BILIRUBIN TOTAL: 1 mg/dL (ref 0.2–1.2)
BUN: 10 mg/dL (ref 6–23)
CO2: 29 mEq/L (ref 19–32)
CREATININE: 0.93 mg/dL (ref 0.40–1.20)
Calcium: 9.4 mg/dL (ref 8.4–10.5)
Chloride: 97 mEq/L (ref 96–112)
GFR: 61.53 mL/min (ref >60.00)
Glucose, Bld: 107 mg/dL — ABNORMAL HIGH (ref 70–99)
Potassium: 4 mEq/L (ref 3.5–5.1)
SODIUM: 133 meq/L — AB (ref 135–145)
TOTAL PROTEIN: 7.4 g/dL (ref 6.0–8.3)

## 2016-12-18 LAB — LIPID PANEL
CHOLESTEROL: 184 mg/dL (ref 0–200)
HDL: 81.5 mg/dL (ref >39.00)
LDL Cholesterol: 79 mg/dL (ref 0–99)
NONHDL: 102.69
Total CHOL/HDL Ratio: 2
Triglycerides: 116 mg/dL (ref 0.0–149.0)
VLDL: 23.2 mg/dL (ref 0.0–40.0)

## 2016-12-18 LAB — TSH: TSH: 1.59 u[IU]/mL (ref 0.35–4.50)

## 2016-12-18 NOTE — Telephone Encounter (Signed)
Insurance re-verification initiated on [employer] online portal. Awaiting determination.

## 2016-12-18 NOTE — Progress Notes (Signed)
Subjective:    Patient ID: Kathleen Thompson, female    DOB: March 27, 1934, 83 y.o.   MRN: [id number]  DOS:  12/18/2016 Type of visit - description : cpx Interval history: Chronic medical issues discussed   Review of Systems Ongoing neck and knee pain. Saw Dr. Lorin Mercy regards the neck pain, MRI was done, follow-up visit pending. Otherwise feels well, she remains relatively active and eats very healthy   Other than above, a 14 point review of systems is negative     Past Medical History:  Diagnosis Date  . Adenomatous colon polyp 02/2003  . Arthritis   . Blood transfusion   . Cataract   . Diverticulosis   . Esophageal stricture   . Gastroparesis   . GERD (gastroesophageal reflux disease)   . Hyperlipidemia   . Hypertension   . Hypothyroidism   . Internal hemorrhoid   . Iron deficiency anemia   . Osteoporosis   . PONV (postoperative nausea and vomiting)   . PVD (peripheral vascular disease) ([place])   . Stroke [facility])    TIA  . TIA (transient ischemic attack)     Past Surgical History:  Procedure Laterality Date  . ABDOMINAL HYSTERECTOMY    . APPENDECTOMY    . CERVICAL FUSION    . EYE SURGERY Bilateral 08/2016   cataracts  . LEFT HEART CATHETERIZATION WITH CORONARY ANGIOGRAM N/A 12/20/2012   Procedure: LEFT HEART CATHETERIZATION WITH CORONARY ANGIOGRAM;  Surgeon: Peter M Martinique, MD;  Location: [facility] CATH LAB;  Service: Cardiovascular;  Laterality: N/A;  . shoulder surgery     . VIDEO ASSISTED THORACOSCOPY (VATS)/WEDGE RESECTION Right 03/14/2013   Procedure: VIDEO ASSISTED THORACOSCOPY (VATS)/WEDGE RESECTION;  Surgeon: Melrose Nakayama, MD;  Location: [place];  Service: Thoracic;  Laterality: Right;    Social History   Social History  . Marital status: Married    Spouse name: N/A  . Number of children: 2  . Years of education: N/A   Occupational History  . Retired-[employer]  Retired   Social History Main Topics  . Smoking status: Never Smoker  . Smokeless tobacco: Never  Used  . Alcohol use No  . Drug use: No  . Sexual activity: Yes   Other Topics Concern  . Not on file   Social History Narrative   Household-- pt and wife   2 boys , one in [place], on in [place]    Only surviving sibling out of seven     Family History  Problem Relation Age of Onset  . Diabetes Mother   . Heart attack Mother   . Heart attack Father   . Asthma Father   . Colon cancer Brother        at age 67  . Colon cancer Sister        at age 71  . Diabetes Maternal Aunt   . Diabetes Maternal Uncle   . Heart attack Sister   . Heart attack Brother   . Heart attack Brother   . Heart attack Brother   . Heart attack Brother   . Heart attack Sister   . Breast cancer Neg Hx      Allergies as of 12/18/2016      Reactions   Clindamycin/lincomycin Rash   Hydrocodone Hives   Rofecoxib    hives   Latex Rash   Pt states that latex causes blisters to skin      Medication List       Accurate as of 12/18/16  11:59 PM. Always use your most recent med list.          aspirin 81 MG tablet Take 81 mg by mouth daily.   CALCIUM PO Take 1 tablet by mouth 2 (two) times daily.   ciclopirox 8 % solution Commonly known as:  PENLAC Apply over nail and surrounding skin. Apply daily over previous coat. After seven (7) days, may remove with alcohol and continue cycle.   clopidogrel 75 MG tablet Commonly known as:  PLAVIX Take 1 tablet (75 mg total) by mouth daily.   CO Q-10 PO Take 1 tablet by mouth daily.   isosorbide mononitrate 30 MG 24 hr tablet Commonly known as:  IMDUR Take 1 tablet (30 mg total) by mouth daily.   levothyroxine 50 MCG tablet Commonly known as:  SYNTHROID Take 1 tablet (50 mcg total) by mouth daily before breakfast. Criss Rosales only   losartan-hydrochlorothiazide 100-12.5 MG tablet Commonly known as:  HYZAAR Take 0.5 tablets by mouth 2 (two) times daily.   magnesium 30 MG tablet Take 30 mg by mouth daily.   pantoprazole 40 MG tablet Commonly known as:   PROTONIX Take 1 tablet (40 mg total) by mouth daily before breakfast.   rosuvastatin 20 MG tablet Commonly known as:  CRESTOR Take 1 tablet (20 mg total) by mouth daily.   traMADol 50 MG tablet Commonly known as:  ULTRAM Take 1 tablet (50 mg total) by mouth 2 (two) times daily as needed for moderate pain.   vitamin D (CHOLECALCIFEROL) 400 units tablet Take 400 Units by mouth daily.          Objective:   Physical Exam BP 124/78 (BP Location: Left Arm, Patient Position: Sitting, Cuff Size: Small)   Pulse 75   Temp 97.8 F (36.6 C) (Oral)   Resp 14   Ht 5\' 3"  (1.6 m)   Wt 148 lb 8 oz (67.4 kg)   SpO2 97%   BMI 26.31 kg/m   General:   Well developed, well nourished . NAD.  Neck: No  thyromegaly  HEENT:  Normocephalic . Face symmetric, atraumatic Lungs:  CTA B Normal respiratory effort, no intercostal retractions, no accessory muscle use. Heart: RRR,  no murmur.  No pretibial edema bilaterally  Abdomen:  Not distended, soft, non-tender. No rebound or rigidity.   Skin: Exposed areas without rash. Not pale. Not jaundice Neurologic:  alert & oriented X3.  Speech normal, gait appropriate for age and unassisted Strength symmetric and appropriate for age.  Psych: Cognition and judgment appear intact.  Cooperative with normal attention span and concentration.  Behavior appropriate. No anxious or depressed appearing.    Assessment & Plan:   Assessment> HTN Hypothyroidism Hyperlipidemia CV: --CAD cath 12/2012 single-vessel CAD,rx CV RF control, on Plavix, imdur --TIA remotely , apparently amaurosis fugax, was a started on on plavix per PCP, no further sx since. --On ASA and Plavix: Discuss 11/17/2016 Osteoporosis -  DEXAs @ [facility], T score -3.9 (12/2015).Prolia #1:  07/2016 MSK: Dr Lorin Mercy --DJD- s/p neck and shoulder surgeries  --knee pain-- on tramadol  GI: GED, gastroparesis, diverticulosis, esophageal stricture H/o Iron deficiency anemia H/o granuloma RUL,  resected 03-14-13   PLAN: HTN: Seems well-controlled on Hyzaar. Checking labs Hypothyroidism: Continue Synthroid, check a TSH Hyperlipidemia: Continue Crestor, checking labs CAD: On aspirin, Plavix, Imdur, Crestor, asx. History of TIA: On aspirin and Plavix MSK: Saw Dr. Esperanza Heir, MRI was done, see below, follow-up visit pending. On tramadol, signed contract today. MRI 12-16-16: 1. Anterior solid fusion  mass at C5-C7 with mild left foraminal narrowing at the adjacent C4-C5 segment. 2. Multilevel mild cervical degenerative disc disease, but no otherspinal canal or neural foraminal stenosis. Osteoporosis: Next prolia next month. RTC 6-8 months

## 2016-12-18 NOTE — Progress Notes (Signed)
Pre visit review using our clinic review tool, if applicable. No additional management support is needed unless otherwise documented below in the visit note. 

## 2016-12-18 NOTE — Patient Instructions (Addendum)
GO TO THE LAB : Get the blood work     GO TO THE FRONT DESK Schedule your next appointment for a  routine checkup in 6-8 months  Also, schedule Medicare wellness with one of our RNs

## 2016-12-18 NOTE — Assessment & Plan Note (Addendum)
--  Tdap-- 02/18/10 ; PNA-- 02/18/10 (23),  prevnar 10/11/13 ; zostavax 02/26/11 ; discussed shingrex (on back order), will wait --No further paps, see previous entries  --Patient elected no further breast ca screening which is within the guidelines.  --CCS: 2 siblings w/ colon ca; 08/21/11 with Lucio Edward, MD - diverticulosis, next 2018 (due), discussed pro-cons, elected to hold off, also discussed IFOB, will think about it  --Diet and exercise discussed. She is doing very well

## 2016-12-19 NOTE — Assessment & Plan Note (Signed)
HTN: Seems well-controlled on Hyzaar. Checking labs Hypothyroidism: Continue Synthroid, check a TSH Hyperlipidemia: Continue Crestor, checking labs CAD: On aspirin, Plavix, Imdur, Crestor, asx. History of TIA: On aspirin and Plavix MSK: Saw Dr. Esperanza Heir, MRI was done, see below, follow-up visit pending. On tramadol, signed contract today. MRI 12-16-16: 1. Anterior solid fusion mass at C5-C7 with mild left foraminal narrowing at the adjacent C4-C5 segment. 2. Multilevel mild cervical degenerative disc disease, but no otherspinal canal or neural foraminal stenosis. Osteoporosis: Next prolia next month. RTC 6-8 months

## 2016-12-23 NOTE — Telephone Encounter (Signed)
Tricia- can you order PROLIA for Pt? Thank you.  

## 2016-12-23 NOTE — Telephone Encounter (Signed)
Received patients Prolia benefits  No PA required Admin and Prolia at subject to $50 copay-even if no OV is billed  Patient may owe approximately $50 out of pocket

## 2016-12-23 NOTE — Telephone Encounter (Signed)
Prolia has been ordered

## 2016-12-25 NOTE — Telephone Encounter (Signed)
Prolia has been received and in fridge for pt.

## 2016-12-27 ENCOUNTER — Other Ambulatory Visit: Payer: Self-pay | Admitting: Internal Medicine

## 2016-12-29 NOTE — Telephone Encounter (Signed)
Patient states she will call back to schedule nurse visit. Patient states she's having surgery and would like to confirm the date with specialist prior to her scheduling her prolia.

## 2016-12-29 NOTE — Telephone Encounter (Signed)
Noted, thank you

## 2016-12-29 NOTE — Telephone Encounter (Signed)
Please call Pt and schedule nurse visit for PROLIA injection anytime after 01/28/2017, thank you.

## 2016-12-31 ENCOUNTER — Ambulatory Visit (INDEPENDENT_AMBULATORY_CARE_PROVIDER_SITE_OTHER): Payer: Medicare Other

## 2016-12-31 ENCOUNTER — Ambulatory Visit (INDEPENDENT_AMBULATORY_CARE_PROVIDER_SITE_OTHER): Payer: Medicare Other | Admitting: Orthopaedic Surgery

## 2016-12-31 ENCOUNTER — Encounter (INDEPENDENT_AMBULATORY_CARE_PROVIDER_SITE_OTHER): Payer: Self-pay | Admitting: Orthopaedic Surgery

## 2016-12-31 VITALS — BP 135/82 | HR 70 | Ht 63.0 in | Wt 150.0 lb

## 2016-12-31 DIAGNOSIS — G8929 Other chronic pain: Secondary | ICD-10-CM | POA: Diagnosis not present

## 2016-12-31 DIAGNOSIS — M65839 Other synovitis and tenosynovitis, unspecified forearm: Secondary | ICD-10-CM

## 2016-12-31 DIAGNOSIS — M25562 Pain in left knee: Secondary | ICD-10-CM

## 2016-12-31 DIAGNOSIS — M6588 Other synovitis and tenosynovitis, other site: Secondary | ICD-10-CM | POA: Diagnosis not present

## 2016-12-31 DIAGNOSIS — M25561 Pain in right knee: Secondary | ICD-10-CM

## 2016-12-31 DIAGNOSIS — M542 Cervicalgia: Secondary | ICD-10-CM

## 2017-01-07 ENCOUNTER — Telehealth (INDEPENDENT_AMBULATORY_CARE_PROVIDER_SITE_OTHER): Payer: Self-pay | Admitting: Orthopedic Surgery

## 2017-01-07 NOTE — Telephone Encounter (Signed)
Ms. Kathleen Thompson called and stated that Dr. Lorin Mercy mentioned surgery for her neck and hand.  She is going to try therapy for her neck, but would like to proceed with the surgery on her hand. Can you please fill out a surgical sheet for her and I will schedule. Thank you

## 2017-01-07 NOTE — Telephone Encounter (Signed)
Blue sheet done. OK for [facility]. thanks

## 2017-01-11 DIAGNOSIS — M65839 Other synovitis and tenosynovitis, unspecified forearm: Secondary | ICD-10-CM | POA: Insufficient documentation

## 2017-01-11 DIAGNOSIS — M542 Cervicalgia: Secondary | ICD-10-CM | POA: Insufficient documentation

## 2017-01-11 NOTE — Progress Notes (Signed)
Office Visit Note   Patient: Kathleen Thompson           Date of Birth: 1933/08/16           MRN: [id number] Visit Date: 12/31/2016              Requested by: Colon Branch, [address] [place], [place] [address] PCP: Colon Branch, MD   Assessment & Plan: Visit Diagnoses:  1. Chronic pain of both knees   2. Cervicalgia   3. Stenosing tenosynovitis of wrist           Right first dorsal compartment  Plan: We discussed surgical options she could proceed with physical therapy for neck and palpation the first dorsal compartment release a we have discussed back in the fall. If she so desires she can wait until after surgery and it if the her neck require surgery both procedures could be done same time. She can call and let us know what she like to do.Her husband is with her today we will over extensively options for treatment. Quad strengthening exercises for knee, surgical options for her cervical spine as well as a right wrist first dorsal compartment.  Follow-Up Instructions: Return in about 1 month (around 01/31/2017).   Orders:  Orders Placed This Encounter  Procedures  . XR KNEE 3 VIEW LEFT  . XR KNEE 3 VIEW RIGHT   No orders of the defined types were placed in this encounter.     Procedures: No procedures performed   Clinical Data: No additional findings.   Subjective: Chief Complaint  Patient presents with  . Neck - Pain, Follow-up  . Left Knee - Pain  . Right Knee - Pain    HPI patient returned she still having ongoing problems with pain in her neck. Injection or trochanter helped. She signed persistent pain in her right wrist first dorsal compartment consistent with stenosing tenosynovitis. She's also had some aching pain in her knees but principally the wrist on the right in the neck or her most symptomatic areas.  Review of Systems 14 point review of systems updated per last month all post visit and are unchanged other than as mentioned in  history of present illness.   Objective: Vital Signs: BP 135/82   Pulse 70   Ht 5\' 3"  (1.6 m)   Wt 150 lb (68 kg)   BMI 26.57 kg/m   Physical Exam  Constitutional: She is oriented to person, place, and time. She appears well-developed.  HENT:  Head: Normocephalic.  Right Ear: External ear normal.  Left Ear: External ear normal.  Eyes: Pupils are equal, round, and reactive to light.  Neck: No tracheal deviation present. No thyromegaly present.  Cardiovascular: Normal rate.   Pulmonary/Chest: Effort normal.  Abdominal: Soft.  Musculoskeletal:  Mild tenderness to palpation both days without significant fusion of pain with hip range of motion negative straight leg raising. Positive Finkelstein test on the right negative on the left. Exquisite tenderness over the first dorsal compartment. She still has persistent brachial plexus tenderness as before. Made over meet. Reflexes are 2+ and symmetrical. No lower extremity hyperreflexia.  Neurological: She is alert and oriented to person, place, and time.  Skin: Skin is warm and dry.  Psychiatric: She has a normal mood and affect. Her behavior is normal.    Ortho Exam  Specialty Comments:  No specialty comments available.  Imaging: No results found.   PMFS History: Patient Active Problem  List   Diagnosis Date Noted  . Stenosing tenosynovitis of wrist 01/11/2017  . Cervicalgia 01/11/2017  . Trochanteric bursitis, left hip 12/02/2016  . h/o TIA   11/18/2016  . Osteoporosis 06/22/2016  . PCP NOTES >>> 05/21/2015  . Annual physical exam 12/12/2014  . Granuloma RUL resected 03/14/13  12/19/2012  . ESOPHAGEAL STRICTURE 11/26/2009  . Family history of malignant neoplasm of gastrointestinal tract 09/17/2009  . Cough variant asthma 09/11/2009  . PERSONAL HX COLONIC POLYPS 12/25/2008  . Dyspnea 03/17/2008  . PALPITATIONS, RECURRENT 02/02/2008  . ADENOMATOUS COLONIC POLYP 12/23/2007  . GERD 12/23/2007  . GASTROPARESIS 12/23/2007  .  Hypothyroidism 03/12/2007  . Hyperlipemia 03/12/2007  . HTN (hypertension) 03/12/2007  . CAD (coronary artery disease) 03/12/2007  . INTERNAL HEMORRHOIDS 06/01/2006  . DIVERTICULOSIS, COLON 06/01/2006   Past Medical History:  Diagnosis Date  . Adenomatous colon polyp 02/2003  . Arthritis   . Blood transfusion   . Cataract   . Diverticulosis   . Esophageal stricture   . Gastroparesis   . GERD (gastroesophageal reflux disease)   . Hyperlipidemia   . Hypertension   . Hypothyroidism   . Internal hemorrhoid   . Iron deficiency anemia   . Osteoporosis   . PONV (postoperative nausea and vomiting)   . PVD (peripheral vascular disease) ([place])   . Stroke [facility])    TIA  . TIA (transient ischemic attack)     Family History  Problem Relation Age of Onset  . Diabetes Mother   . Heart attack Mother   . Heart attack Father   . Asthma Father   . Colon cancer Brother        at age 37  . Colon cancer Sister        at age 16  . Diabetes Maternal Aunt   . Diabetes Maternal Uncle   . Heart attack Sister   . Heart attack Brother   . Heart attack Brother   . Heart attack Brother   . Heart attack Brother   . Heart attack Sister   . Breast cancer Neg Hx     Past Surgical History:  Procedure Laterality Date  . ABDOMINAL HYSTERECTOMY    . APPENDECTOMY    . CERVICAL FUSION    . EYE SURGERY Bilateral 08/2016   cataracts  . LEFT HEART CATHETERIZATION WITH CORONARY ANGIOGRAM N/A 12/20/2012   Procedure: LEFT HEART CATHETERIZATION WITH CORONARY ANGIOGRAM;  Surgeon: Peter M Martinique, MD;  Location: [facility] CATH LAB;  Service: Cardiovascular;  Laterality: N/A;  . shoulder surgery     . VIDEO ASSISTED THORACOSCOPY (VATS)/WEDGE RESECTION Right 03/14/2013   Procedure: VIDEO ASSISTED THORACOSCOPY (VATS)/WEDGE RESECTION;  Surgeon: Melrose Nakayama, MD;  Location: [place];  Service: Thoracic;  Laterality: Right;   Social History   Occupational History  . Retired-[employer]  Retired   Social History Main  Topics  . Smoking status: Never Smoker  . Smokeless tobacco: Never Used  . Alcohol use No  . Drug use: No  . Sexual activity: Yes

## 2017-01-26 DIAGNOSIS — M654 Radial styloid tenosynovitis [de Quervain]: Secondary | ICD-10-CM | POA: Diagnosis not present

## 2017-02-04 ENCOUNTER — Ambulatory Visit (INDEPENDENT_AMBULATORY_CARE_PROVIDER_SITE_OTHER): Payer: Medicare Other | Admitting: Orthopaedic Surgery

## 2017-02-04 ENCOUNTER — Encounter (INDEPENDENT_AMBULATORY_CARE_PROVIDER_SITE_OTHER): Payer: Self-pay | Admitting: Orthopaedic Surgery

## 2017-02-04 DIAGNOSIS — Z9889 Other specified postprocedural states: Secondary | ICD-10-CM

## 2017-02-04 NOTE — Patient Instructions (Signed)
Do gentle range of motion exercises as we discussed.  Do not soak your hand.  Strips on incision will eventually fall off in a week or so.

## 2017-02-04 NOTE — Progress Notes (Signed)
Post-Op Visit Note   Patient: Kathleen Thompson           Date of Birth: 21-Mar-1934           MRN: [id number] Visit Date: 02/04/2017 PCP: Colon Branch, MD   Assessment & Plan:  Chief Complaint:  Chief Complaint  Patient presents with  . Left Wrist - Routine Post Op   Visit Diagnoses:  1. Status post de Quervain's release surgery   Patient states that she is doing well. Not having much pain. Discontinued Norco because it made her feel weird. Has tramadol at home that she is using. Has been using thumb spica splint.  Plan: Patient will continue using removable thumb spica splint. Work on gentle range of motion exercises as described. Elevate hand as much as possible to help decrease pain and swelling. States that she has Ultram at home and will use this for pain.  Follow-Up Instructions: Return in about 5 weeks (around 03/11/2017).   Orders:  No orders of the defined types were placed in this encounter.  No orders of the defined types were placed in this encounter.   Imaging: No results found.  PMFS History: Patient Active Problem List   Diagnosis Date Noted  . Stenosing tenosynovitis of wrist 01/11/2017  . Cervicalgia 01/11/2017  . Trochanteric bursitis, left hip 12/02/2016  . h/o TIA   11/18/2016  . Osteoporosis 06/22/2016  . PCP NOTES >>> 05/21/2015  . Annual physical exam 12/12/2014  . Granuloma RUL resected 03/14/13  12/19/2012  . ESOPHAGEAL STRICTURE 11/26/2009  . Family history of malignant neoplasm of gastrointestinal tract 09/17/2009  . Cough variant asthma 09/11/2009  . PERSONAL HX COLONIC POLYPS 12/25/2008  . Dyspnea 03/17/2008  . PALPITATIONS, RECURRENT 02/02/2008  . ADENOMATOUS COLONIC POLYP 12/23/2007  . GERD 12/23/2007  . GASTROPARESIS 12/23/2007  . Hypothyroidism 03/12/2007  . Hyperlipemia 03/12/2007  . HTN (hypertension) 03/12/2007  . CAD (coronary artery disease) 03/12/2007  . INTERNAL HEMORRHOIDS 06/01/2006  . DIVERTICULOSIS, COLON 06/01/2006    Past Medical History:  Diagnosis Date  . Adenomatous colon polyp 02/2003  . Arthritis   . Blood transfusion   . Cataract   . Diverticulosis   . Esophageal stricture   . Gastroparesis   . GERD (gastroesophageal reflux disease)   . Hyperlipidemia   . Hypertension   . Hypothyroidism   . Internal hemorrhoid   . Iron deficiency anemia   . Osteoporosis   . PONV (postoperative nausea and vomiting)   . PVD (peripheral vascular disease) ([place])   . Stroke [facility])    TIA  . TIA (transient ischemic attack)     Family History  Problem Relation Age of Onset  . Diabetes Mother   . Heart attack Mother   . Heart attack Father   . Asthma Father   . Colon cancer Brother        at age 39  . Colon cancer Sister        at age 51  . Diabetes Maternal Aunt   . Diabetes Maternal Uncle   . Heart attack Sister   . Heart attack Brother   . Heart attack Brother   . Heart attack Brother   . Heart attack Brother   . Heart attack Sister   . Breast cancer Neg Hx     Past Surgical History:  Procedure Laterality Date  . ABDOMINAL HYSTERECTOMY    . APPENDECTOMY    . CERVICAL FUSION    . EYE SURGERY Bilateral  08/2016   cataracts  . LEFT HEART CATHETERIZATION WITH CORONARY ANGIOGRAM N/A 12/20/2012   Procedure: LEFT HEART CATHETERIZATION WITH CORONARY ANGIOGRAM;  Surgeon: Peter M Martinique, MD;  Location: [facility] CATH LAB;  Service: Cardiovascular;  Laterality: N/A;  . shoulder surgery     . VIDEO ASSISTED THORACOSCOPY (VATS)/WEDGE RESECTION Right 03/14/2013   Procedure: VIDEO ASSISTED THORACOSCOPY (VATS)/WEDGE RESECTION;  Surgeon: Melrose Nakayama, MD;  Location: [place];  Service: Thoracic;  Laterality: Right;   Social History   Occupational History  . Retired-[employer]  Retired   Social History Main Topics  . Smoking status: Never Smoker  . Smokeless tobacco: Never Used  . Alcohol use No  . Drug use: No  . Sexual activity: Yes   Exam New Steri-Strips applied. Incision well without signs of  infection.

## 2017-02-09 NOTE — Telephone Encounter (Signed)
Patient scheduled for 02/10/17 (Tue)

## 2017-02-10 ENCOUNTER — Ambulatory Visit (INDEPENDENT_AMBULATORY_CARE_PROVIDER_SITE_OTHER): Payer: Medicare Other | Admitting: Behavioral Health

## 2017-02-10 DIAGNOSIS — M810 Age-related osteoporosis without current pathological fracture: Secondary | ICD-10-CM

## 2017-02-10 MED ORDER — DENOSUMAB 60 MG/ML ~~LOC~~ SOLN
60.0000 mg | Freq: Once | SUBCUTANEOUS | Status: AC
  Administered 2017-02-10: 60 mg via SUBCUTANEOUS

## 2017-02-10 NOTE — Progress Notes (Addendum)
Pre visit review using our clinic review tool, if applicable. No additional management support is needed unless otherwise documented below in the visit note.  Patient came in clinic for Prolia injection. SQ injection was given in the left arm. Patient tolerated it well. No signs or symptoms of a reaction before leaving the visit. Kathlene November, MD

## 2017-03-08 ENCOUNTER — Other Ambulatory Visit: Payer: Self-pay | Admitting: Internal Medicine

## 2017-03-09 NOTE — Telephone Encounter (Signed)
Rx faxed to [facility]  

## 2017-03-09 NOTE — Telephone Encounter (Signed)
Advise patient: Will  send tramadol.#180, no RF Also remind her that by contract, she does not need to receive controlled substances particularly pain medication from other providers.

## 2017-03-09 NOTE — Telephone Encounter (Signed)
Pt is requesting refill on tramadol 50mg .  Last OV: 12/18/2016 Last Fill: 12/04/2016 #180 and 0RF UDS: 06/20/2016 Low risk  [place] Controlled Substance database printed;  Hydrocodone-acetaminophen 5-325mg  #20 prescribed by Dr. Rodell Perna; [place] on 01/26/2017 Diazepam 5mg  tabs #4 prescribed by Dr. Rodell Perna; [place] on 12/04/2016   No other issues noted  Please advise.

## 2017-03-09 NOTE — Telephone Encounter (Signed)
Rx printed, awaiting MD signature.  

## 2017-03-17 ENCOUNTER — Ambulatory Visit (INDEPENDENT_AMBULATORY_CARE_PROVIDER_SITE_OTHER): Payer: Medicare Other | Admitting: Orthopaedic Surgery

## 2017-03-28 ENCOUNTER — Other Ambulatory Visit: Payer: Self-pay | Admitting: Internal Medicine

## 2017-04-08 ENCOUNTER — Other Ambulatory Visit: Payer: Self-pay | Admitting: Internal Medicine

## 2017-04-28 ENCOUNTER — Other Ambulatory Visit: Payer: Self-pay

## 2017-04-28 MED ORDER — ROSUVASTATIN CALCIUM 20 MG PO TABS: 20.0000 mg | ORAL_TABLET | Freq: Every day | ORAL | 0 refills | Status: DC

## 2017-06-06 ENCOUNTER — Other Ambulatory Visit: Payer: Self-pay | Admitting: Internal Medicine

## 2017-06-08 NOTE — Telephone Encounter (Signed)
Rx printed, awaiting MD signature.  

## 2017-06-08 NOTE — Telephone Encounter (Signed)
Was prescribed #20 hydrocodone for a procedure which seems reasonable. Okay to refill tramadol No. 180, no refills. Advised patient not to mix tramadol and hydrocodone

## 2017-06-08 NOTE — Telephone Encounter (Signed)
Rx faxed to [facility]  

## 2017-06-08 NOTE — Telephone Encounter (Signed)
Pt is requesting refill on Tramadol 50mg .   Last OV: 12/18/2016, appt scheduled 07/06/2017 Last Fill: 03/09/2017 #180 and 0RF UDS: 06/20/2016 Low risk  [place]CR printed; no issues noted since last check  [Dr. Rodell Perna, Ortho, had prescribed Hydrocodone-acetaminophen 5-325mg  on 01/26/2017 for a procedure.]

## 2017-07-03 ENCOUNTER — Other Ambulatory Visit: Payer: Self-pay | Admitting: Internal Medicine

## 2017-07-06 ENCOUNTER — Ambulatory Visit: Payer: Medicare Other | Admitting: Internal Medicine

## 2017-07-06 ENCOUNTER — Encounter: Payer: Self-pay | Admitting: Internal Medicine

## 2017-07-06 ENCOUNTER — Telehealth: Payer: Self-pay

## 2017-07-06 VITALS — BP 116/68 | HR 83 | Temp 98.1°F | Resp 14 | Ht 63.0 in | Wt 154.5 lb

## 2017-07-06 DIAGNOSIS — I10 Essential (primary) hypertension: Secondary | ICD-10-CM | POA: Diagnosis not present

## 2017-07-06 DIAGNOSIS — E039 Hypothyroidism, unspecified: Secondary | ICD-10-CM

## 2017-07-06 LAB — BASIC METABOLIC PANEL
BUN: 14 mg/dL (ref 6–23)
CHLORIDE: 101 meq/L (ref 96–112)
CO2: 30 mEq/L (ref 19–32)
CREATININE: 0.92 mg/dL (ref 0.40–1.20)
Calcium: 10.2 mg/dL (ref 8.4–10.5)
GFR: 62.22 mL/min (ref >60.00)
Glucose, Bld: 119 mg/dL — ABNORMAL HIGH (ref 70–99)
Potassium: 4.3 mEq/L (ref 3.5–5.1)
Sodium: 140 mEq/L (ref 135–145)

## 2017-07-06 LAB — CBC WITH DIFFERENTIAL/PLATELET
BASOS PCT: 0.8 % (ref 0.0–3.0)
Basophils Absolute: 0 10*3/uL (ref 0.0–0.1)
EOS ABS: 0.1 10*3/uL (ref 0.0–0.7)
EOS PCT: 2 % (ref 0.0–5.0)
HEMATOCRIT: 43.4 % (ref 36.0–46.0)
HEMOGLOBIN: 14.5 g/dL (ref 12.0–15.0)
LYMPHS PCT: 42.9 % (ref 12.0–46.0)
Lymphs Abs: 2.2 10*3/uL (ref 0.7–4.0)
MCHC: 33.4 g/dL (ref 30.0–36.0)
MCV: 93.7 fl (ref 78.0–100.0)
MONOS PCT: 7.5 % (ref 3.0–12.0)
Monocytes Absolute: 0.4 10*3/uL (ref 0.1–1.0)
Neutro Abs: 2.3 10*3/uL (ref 1.4–7.7)
Neutrophils Relative %: 46.8 % (ref 43.0–77.0)
Platelets: 255 10*3/uL (ref 150.0–400.0)
RBC: 4.63 Mil/uL (ref 3.87–5.11)
RDW: 13.6 % (ref 11.5–15.5)
WBC: 5 10*3/uL (ref 4.0–10.5)

## 2017-07-06 MED ORDER — DICLOFENAC SODIUM 1 % TD GEL: 2.0000 g | Freq: Four times a day (QID) | TRANSDERMAL | 6 refills | Status: DC

## 2017-07-06 MED ORDER — ROSUVASTATIN CALCIUM 20 MG PO TABS: 20.0000 mg | ORAL_TABLET | Freq: Every day | ORAL | 1 refills | Status: DC

## 2017-07-06 NOTE — Progress Notes (Signed)
Subjective:    Patient ID: Kathleen Thompson, female    DOB: 1934-07-07, 83 y.o.   MRN: [id number]  DOS:  07/06/2017 Type of visit - description : rov Interval history: In general feeling well. Is concerned because her insurance sent her a letter stating that they will cob]ver only 1 tramadol daily starting next year .    Review of Systems Few days ago, she felt  slightly fatigued.  Symptoms lasted 12 hours, at the time did not have chest pain, difficulty breathing or palpitations.  No dizziness. Her BP was check: 96/60, pulse 71.  On further questioning, her ambulatory BPs are always in the low side around 100/70. Fatigue is now completely resolved and her energy is back to normal.  Other than above, a 14 point review of systems is negative     Past Medical History:  Diagnosis Date  . Adenomatous colon polyp 02/2003  . Arthritis   . Blood transfusion   . Cataract   . Diverticulosis   . Esophageal stricture   . Gastroparesis   . GERD (gastroesophageal reflux disease)   . Hyperlipidemia   . Hypertension   . Hypothyroidism   . Internal hemorrhoid   . Iron deficiency anemia   . Osteoporosis   . PONV (postoperative nausea and vomiting)   . PVD (peripheral vascular disease) ([place])   . Stroke [facility])    TIA  . TIA (transient ischemic attack)     Past Surgical History:  Procedure Laterality Date  . ABDOMINAL HYSTERECTOMY    . APPENDECTOMY    . CERVICAL FUSION    . EYE SURGERY Bilateral 08/2016   cataracts  . LEFT HEART CATHETERIZATION WITH CORONARY ANGIOGRAM N/A 12/20/2012   Procedure: LEFT HEART CATHETERIZATION WITH CORONARY ANGIOGRAM;  Surgeon: Peter M Martinique, MD;  Location: [facility] CATH LAB;  Service: Cardiovascular;  Laterality: N/A;  . shoulder surgery     . VIDEO ASSISTED THORACOSCOPY (VATS)/WEDGE RESECTION Right 03/14/2013   Procedure: VIDEO ASSISTED THORACOSCOPY (VATS)/WEDGE RESECTION;  Surgeon: Melrose Nakayama, MD;  Location: [place];  Service: Thoracic;  Laterality:  Right;    Social History   Socioeconomic History  . Marital status: Married    Spouse name: Not on file  . Number of children: 2  . Years of education: Not on file  . Highest education level: Not on file  Social Needs  . Financial resource strain: Not on file  . Food insecurity - worry: Not on file  . Food insecurity - inability: Not on file  . Transportation needs - medical: Not on file  . Transportation needs - non-medical: Not on file  Occupational History  . Occupation: Retired-[employer]     Employer: RETIRED  Tobacco Use  . Smoking status: Never Smoker  . Smokeless tobacco: Never Used  Substance and Sexual Activity  . Alcohol use: No  . Drug use: No  . Sexual activity: Yes  Other Topics Concern  . Not on file  Social History Narrative   Household-- pt and wife   2 boys , one in [place], on in [place]    Only surviving sibling out of seven     Family History  Problem Relation Age of Onset  . Diabetes Mother   . Heart attack Mother   . Heart attack Father   . Asthma Father   . Colon cancer Brother        at age 75  . Colon cancer Sister  at age 1  . Diabetes Maternal Aunt   . Diabetes Maternal Uncle   . Heart attack Sister   . Heart attack Brother   . Heart attack Brother   . Heart attack Brother   . Heart attack Brother   . Heart attack Sister   . Breast cancer Neg Hx      Allergies as of 07/06/2017      Reactions   Clindamycin/lincomycin Rash   Hydrocodone Hives   Rofecoxib    hives   Latex Rash   Pt states that latex causes blisters to skin      Medication List        Accurate as of 07/06/17  5:40 PM. Always use your most recent med list.          aspirin 81 MG tablet Take 81 mg by mouth daily.   CALCIUM PO Take 1 tablet by mouth 2 (two) times daily.   ciclopirox 8 % solution Commonly known as:  PENLAC Apply over nail and surrounding skin. Apply daily over previous coat. After seven (7) days, may remove with alcohol and continue  cycle.   clopidogrel 75 MG tablet Commonly known as:  PLAVIX Take 1 tablet (75 mg total) by mouth daily.   CO Q-10 PO Take 1 tablet by mouth daily.   diclofenac sodium 1 % Gel Commonly known as:  VOLTAREN Apply 2 g topically 4 (four) times daily.   isosorbide mononitrate 30 MG 24 hr tablet Commonly known as:  IMDUR Take 1 tablet (30 mg total) by mouth daily.   losartan-hydrochlorothiazide 100-12.5 MG tablet Commonly known as:  HYZAAR Take 0.5 tablets by mouth 2 (two) times daily.   magnesium 30 MG tablet Take 30 mg by mouth daily.   pantoprazole 40 MG tablet Commonly known as:  PROTONIX Take 1 tablet (40 mg total) by mouth daily before breakfast.   rosuvastatin 20 MG tablet Commonly known as:  CRESTOR Take 1 tablet (20 mg total) by mouth daily.   SYNTHROID 50 MCG tablet Generic drug:  levothyroxine Take 1 tablet (50 mcg total) by mouth daily before breakfast.   traMADol 50 MG tablet Commonly known as:  ULTRAM Take 1 tablet (50 mg total) by mouth 2 (two) times daily as needed for moderate pain.   vitamin D (CHOLECALCIFEROL) 400 units tablet Take 400 Units by mouth daily.          Objective:   Physical Exam BP 116/68 (BP Location: Right Arm, Patient Position: Sitting, Cuff Size: Small)   Pulse 83   Temp 98.1 F (36.7 C) (Oral)   Resp 14   Ht 5\' 3"  (1.6 m)   Wt 154 lb 8 oz (70.1 kg)   SpO2 97%   BMI 27.37 kg/m  General:   Well developed, well nourished . NAD.  HEENT:  Normocephalic . Face symmetric, atraumatic Lungs:  CTA B Normal respiratory effort, no intercostal retractions, no accessory muscle use. Heart: RRR,  no murmur.  No pretibial edema bilaterally  Skin: Not pale. Not jaundice Neurologic:  alert & oriented X3.  Speech normal, gait appropriate for age and unassisted Psych--  Cognition and judgment appear intact.  Cooperative with normal attention span and concentration.  Behavior appropriate. No anxious or depressed appearing.       Assessment & Plan:   Assessment HTN Hypothyroidism Hyperlipidemia CV: --CAD cath 12/2012 single-vessel CAD,rx CV RF control, on Plavix, imdur --TIA remotely , apparently amaurosis fugax, was a started on on plavix per PCP,  no further sx since. --On ASA and Plavix: Discuss 11/17/2016 Osteoporosis -  DEXAs @ [facility], T score -3.9 (12/2015).Prolia #1:  07/2016 MSK: Dr Lorin Mercy --DJD- s/p neck and shoulder surgeries  --knee pain: on tramadol rx per pcp GI: GED, gastroparesis, diverticulosis, esophageal stricture H/o Iron deficiency anemia H/o granuloma RUL, resected 03-14-13  PLAN: HTN: On Hyzaar, Imdur.  BP in the low side at home but always within normal at the office.  No persistent fatigue or sxs thus no change.  Check a BMP and CBC Hypothyroidism: Last TSH satisfactory. DJD: On Ultram 2 tablets daily, states that her insurance will pay only for 1 tablet daily.  Options?   -We could switch to extended release Ultram 1 tablet daily -In addition to tramadol, use  Tylenol and topical NSAIDs daily.   --rec  to discuss her need to take tramadol twice daily with her insurance,I'm  willing to write a letter of support RTC 6 months, CPX

## 2017-07-06 NOTE — Telephone Encounter (Signed)
Request Reference Number: [id number]. DICLOFENAC GEL 1% is approved through 08/10/2018. For further questions, call [phone].

## 2017-07-06 NOTE — Telephone Encounter (Signed)
Copied from [place] [id number]. Topic: General - Other >> Jul 06, 2017  3:55 PM Damita Dunnings, CMA wrote: Reason for CRM: PA initiated via Covermymeds; KEY: [id number]. Awaiting determination.

## 2017-07-06 NOTE — Progress Notes (Signed)
Pre visit review using our clinic review tool, if applicable. No additional management support is needed unless otherwise documented below in the visit note. 

## 2017-07-06 NOTE — Assessment & Plan Note (Addendum)
HTN: On Hyzaar, Imdur.  BP in the low side at home but always within normal at the office.  No persistent fatigue or sxs thus no change.  Check a BMP and CBC Hypothyroidism: Last TSH satisfactory. DJD: On Ultram 2 tablets daily, states that her insurance will pay only for 1 tablet daily.  Options?   -We could switch to extended release Ultram 1 tablet daily -In addition to tramadol, use  Tylenol and topical NSAIDs daily.   --rec  to discuss her need to take tramadol twice daily with her insurance,I'm  willing to write a letter of support RTC 6 months, CPX

## 2017-07-06 NOTE — Patient Instructions (Signed)
GO TO THE LAB : Get the blood work     GO TO THE FRONT DESK Schedule your next appointment for a physical exam in 6 months    If you are unable to get Ultram 2 tablets daily you could do the following: Take Ultram 1 tablet daily Tylenol  500 mg OTC 2 tabs a day every 8 hours as needed for pain Apply the Voltaren gel to your knees 3-4 times a day as needed

## 2017-07-15 ENCOUNTER — Telehealth: Payer: Self-pay | Admitting: Internal Medicine

## 2017-07-15 NOTE — Telephone Encounter (Signed)
Medication has been ordered.

## 2017-07-15 NOTE — Telephone Encounter (Signed)
Tricia-can you order Prolia for Pt please?

## 2017-07-15 NOTE — Telephone Encounter (Signed)
Prolia benefits received PA NOT required $50 copay for Prolia $40 copay for Admin   Patient may owe approximately $31 OOP  Patient due after 08/09/17  Letter mailed to inform patient of benefits and to schedule

## 2017-07-17 NOTE — Telephone Encounter (Signed)
Letter sent by [place]ique informing Pt when she is due for Prolia.

## 2017-07-17 NOTE — Telephone Encounter (Signed)
Medication is here for pt.

## 2017-08-18 ENCOUNTER — Other Ambulatory Visit: Payer: Self-pay | Admitting: Internal Medicine

## 2017-09-03 ENCOUNTER — Telehealth: Payer: Self-pay | Admitting: Internal Medicine

## 2017-09-03 NOTE — Telephone Encounter (Signed)
Prolia benefits received PA not required $20 copay for Admin and Prolia   Patient may owe approximately $32 OOP  Patient due after 08/09/18  Letter mailed to inform patient of benefits and to schedule

## 2017-09-05 ENCOUNTER — Telehealth: Payer: Self-pay | Admitting: Internal Medicine

## 2017-09-07 NOTE — Telephone Encounter (Signed)
Pt is requesting refill on tramadol.   Last OV: 07/06/2017 Last Fill: 06/08/2017 #180 and 0RF UDS: 06/20/2016 Low risk  Needs UDS and contract at next [place] printed- no issues noted  Please advise.

## 2017-09-07 NOTE — Telephone Encounter (Signed)
Sent!

## 2017-10-06 ENCOUNTER — Ambulatory Visit (INDEPENDENT_AMBULATORY_CARE_PROVIDER_SITE_OTHER): Payer: Medicare Other

## 2017-10-06 DIAGNOSIS — M810 Age-related osteoporosis without current pathological fracture: Secondary | ICD-10-CM | POA: Diagnosis not present

## 2017-10-06 MED ORDER — DENOSUMAB 60 MG/ML ~~LOC~~ SOLN
60.0000 mg | Freq: Once | SUBCUTANEOUS | Status: AC
  Administered 2017-10-06: 60 mg via SUBCUTANEOUS

## 2017-10-06 NOTE — Progress Notes (Signed)
Patient presents today for prolia injection. Patient given injection SQ in left arm. Patient next injection 04/06/18.

## 2017-10-08 ENCOUNTER — Other Ambulatory Visit: Payer: Self-pay | Admitting: Internal Medicine

## 2017-10-27 ENCOUNTER — Other Ambulatory Visit: Payer: Self-pay | Admitting: Internal Medicine

## 2017-10-27 NOTE — Telephone Encounter (Signed)
RX approved and sent to the pharmacy by e-script.//AB/CMA

## 2017-12-04 ENCOUNTER — Ambulatory Visit (INDEPENDENT_AMBULATORY_CARE_PROVIDER_SITE_OTHER): Payer: Medicare Other | Admitting: Orthopaedic Surgery

## 2017-12-07 ENCOUNTER — Telehealth: Payer: Self-pay | Admitting: Internal Medicine

## 2017-12-07 NOTE — Telephone Encounter (Signed)
Sent!

## 2017-12-07 NOTE — Telephone Encounter (Signed)
Pt is requesting refill on tramadol.   Last OV: 07/06/2017 Last Fill: 09/07/2017 #180 and 6FB UDS: 06/20/2016 Low risk  [place] printed 09/07/2017- no discrepancies noted- in media  Please advise.

## 2017-12-18 ENCOUNTER — Ambulatory Visit (INDEPENDENT_AMBULATORY_CARE_PROVIDER_SITE_OTHER): Payer: Medicare Other

## 2017-12-18 ENCOUNTER — Encounter (INDEPENDENT_AMBULATORY_CARE_PROVIDER_SITE_OTHER): Payer: Self-pay | Admitting: Orthopaedic Surgery

## 2017-12-18 ENCOUNTER — Ambulatory Visit (INDEPENDENT_AMBULATORY_CARE_PROVIDER_SITE_OTHER): Payer: Medicare Other | Admitting: Orthopaedic Surgery

## 2017-12-18 VITALS — BP 123/78 | HR 77 | Ht 62.0 in | Wt 150.0 lb

## 2017-12-18 DIAGNOSIS — M542 Cervicalgia: Secondary | ICD-10-CM | POA: Diagnosis not present

## 2017-12-18 DIAGNOSIS — G8929 Other chronic pain: Secondary | ICD-10-CM | POA: Diagnosis not present

## 2017-12-18 DIAGNOSIS — M419 Scoliosis, unspecified: Secondary | ICD-10-CM | POA: Diagnosis not present

## 2017-12-18 DIAGNOSIS — M25552 Pain in left hip: Secondary | ICD-10-CM

## 2017-12-18 DIAGNOSIS — M545 Low back pain: Secondary | ICD-10-CM

## 2017-12-21 ENCOUNTER — Encounter (INDEPENDENT_AMBULATORY_CARE_PROVIDER_SITE_OTHER): Payer: Self-pay | Admitting: Orthopaedic Surgery

## 2017-12-21 NOTE — Progress Notes (Signed)
Office Visit Note   Patient: Kathleen Thompson           Date of Birth: 02-Aug-1934           MRN: [id number] Visit Date: 12/18/2017              Requested by: Colon Branch, [address] [place], [place] [address] PCP: Colon Branch, MD   Assessment & Plan: Visit Diagnoses:  1. Neck pain   2. Chronic left-sided low back pain, with sciatica presence unspecified   3. Pain in left hip   4. Scoliosis, unspecified scoliosis type, unspecified spinal region     Plan: MRI scan cervical spine shows some foraminal narrowing on the left at C4-5 above her solid C5-C7 Cloward fusion.  She is having significant amount of back and leg pain which bothers her with activities of daily living.  No symptoms on the right leg.  Follow-Up Instructions: No follow-ups on file.   Orders:  Orders Placed This Encounter  Procedures  . XR Lumbar Spine 2-3 Views  . XR Cervical Spine 2 or 3 views  . XR Pelvis 1-2 Views  . MR Lumbar Spine w/o contrast   No orders of the defined types were placed in this encounter.     Procedures: No procedures performed   Clinical Data: No additional findings.   Subjective: Chief Complaint  Patient presents with  . Neck - Pain  . Lower Back - Pain  . Left Hip - Pain    HPI 84 year old female seen with ongoing problems with neck pain on the left side of her neck and radiation into her shoulder blade.  She is been using home cervical traction heat and tramadol without relief.  She had enough so where she fell and had back pain left buttocks pain left lateral hip pain that radiated down her leg.  She has increased pain with sitting.  She has some lumbar scoliosis.  Review of Systems previous C5-6 C6-7 Cloward fusion greater than 10 years ago.  Right wrist first dorsal compartment release doing well.  History of hypothyroidism hyperlipidemia, hypertension, coronary artery disease.  History of osteopenia.  Left trochanteric  bursitis.   Objective: Vital Signs: BP 123/78   Pulse 77   Ht 5\' 2"  (1.575 m)   Wt 150 lb (68 kg)   BMI 27.44 kg/m   Physical Exam  Constitutional: She is oriented to person, place, and time. She appears well-developed.  HENT:  Head: Normocephalic.  Right Ear: External ear normal.  Left Ear: External ear normal.  Eyes: Pupils are equal, round, and reactive to light.  Neck: No tracheal deviation present. No thyromegaly present.  Cardiovascular: Normal rate.  Pulmonary/Chest: Effort normal.  Abdominal: Soft.  Neurological: She is alert and oriented to person, place, and time.  Skin: Skin is warm and dry.  Psychiatric: She has a normal mood and affect. Her behavior is normal.    Ortho Exam patient has well-healed first dorsal compartment release negative Finkelstein on the right.  Well-healed cervical incision brachial plexus tenderness on the left negative on the right.  Negative logroll the hips.  She has left-sided sciatic notch tenderness trochanteric bursal tenderness.  She has some pain with straight leg raising positive compression on the left negative on the right.  Anterior tib gastrocsoleus is strong. Specialty Comments:  No specialty comments available.  Imaging: CLINICAL DATA:  Neck pain  EXAM: MRI CERVICAL SPINE WITHOUT CONTRAST  TECHNIQUE: Multiplanar,  multisequence MR imaging of the cervical spine was performed. No intravenous contrast was administered.  COMPARISON:  Cervical spine radiograph 12/02/2016  FINDINGS: Alignment: Mild reversal of normal lordosis of the C5-C7 levels. No static subluxation.  Vertebrae: Anterior osseous fusion at C5-C7. No acute compression fracture, discitis-osteomyelitis, facet edema or other focal marrow lesion. No epidural collection.  Cord: Normal caliber and signal  Posterior Fossa, vertebral arteries, paraspinal tissues: Unchanged prominence of the prevertebral soft tissues at the fused levels. Otherwise  normal.  Disc levels:  C1-C2: Moderate degenerative change.  No stenosis.  C2-C3: Normal disc space and facets. No spinal canal or neuroforaminal stenosis.  C3-C4: Small central disc protrusion and left facet hypertrophy. No spinal canal or neural foraminal stenosis.  C4-C5: Small central disc protrusion and ligamentum flavum redundancy. Mild left neural foraminal narrowing. No spinal canal or right neural foraminal stenosis.  C5-C6: Anterior solid osseous fusion. No spinal canal or neural foraminal stenosis.  C6-C7: Solid osseous fusion anteriorly without spinal canal or neural foraminal stenosis.  C7-T1: Minimal disc bulge without stenosis.  T1-T2:  Normal.  The T2-T4 levels are evaluated on sagittal sequences only. There is no disc herniation, spinal canal stenosis or neural foraminal narrowing.  IMPRESSION: 1. Anterior solid fusion mass at C5-C7 with mild left foraminal narrowing at the adjacent C4-C5 segment. 2. Multilevel mild cervical degenerative disc disease, but no other spinal canal or neural foraminal stenosis.   Electronically Signed   By: Ulyses Jarred M.D.   On: 12/16/2016 14:29   PMFS History: Patient Active Problem List   Diagnosis Date Noted  . Stenosing tenosynovitis of wrist 01/11/2017  . Cervicalgia 01/11/2017  . Trochanteric bursitis, left hip 12/02/2016  . h/o TIA   11/18/2016  . Osteoporosis 06/22/2016  . PCP NOTES >>> 05/21/2015  . Annual physical exam 12/12/2014  . Granuloma RUL resected 03/14/13  12/19/2012  . ESOPHAGEAL STRICTURE 11/26/2009  . Family history of malignant neoplasm of gastrointestinal tract 09/17/2009  . Cough variant asthma 09/11/2009  . PERSONAL HX COLONIC POLYPS 12/25/2008  . Dyspnea 03/17/2008  . PALPITATIONS, RECURRENT 02/02/2008  . ADENOMATOUS COLONIC POLYP 12/23/2007  . GERD 12/23/2007  . GASTROPARESIS 12/23/2007  . Hypothyroidism 03/12/2007  . Hyperlipemia 03/12/2007  . HTN (hypertension)  03/12/2007  . CAD (coronary artery disease) 03/12/2007  . INTERNAL HEMORRHOIDS 06/01/2006  . DIVERTICULOSIS, COLON 06/01/2006   Past Medical History:  Diagnosis Date  . Adenomatous colon polyp 02/2003  . Arthritis   . Blood transfusion   . Cataract   . Diverticulosis   . Esophageal stricture   . Gastroparesis   . GERD (gastroesophageal reflux disease)   . Hyperlipidemia   . Hypertension   . Hypothyroidism   . Internal hemorrhoid   . Iron deficiency anemia   . Osteoporosis   . PONV (postoperative nausea and vomiting)   . PVD (peripheral vascular disease) ([place])   . Stroke [facility])    TIA  . TIA (transient ischemic attack)     Family History  Problem Relation Age of Onset  . Diabetes Mother   . Heart attack Mother   . Heart attack Father   . Asthma Father   . Colon cancer Brother        at age 80  . Colon cancer Sister        at age 34  . Diabetes Maternal Aunt   . Diabetes Maternal Uncle   . Heart attack Sister   . Heart attack Brother   . Heart attack  Brother   . Heart attack Brother   . Heart attack Brother   . Heart attack Sister   . Breast cancer Neg Hx     Past Surgical History:  Procedure Laterality Date  . ABDOMINAL HYSTERECTOMY    . APPENDECTOMY    . CERVICAL FUSION    . EYE SURGERY Bilateral 08/2016   cataracts  . LEFT HEART CATHETERIZATION WITH CORONARY ANGIOGRAM N/A 12/20/2012   Procedure: LEFT HEART CATHETERIZATION WITH CORONARY ANGIOGRAM;  Surgeon: Peter M Martinique, MD;  Location[facility] CATH LAB;  Service: Cardiovascular;  Laterality: N/A;  . shoulder surgery     . VIDEO ASSISTED THORACOSCOPY (VATS)/WEDGE RESECTION Right 03/14/2013   Procedure: VIDEO ASSISTED THORACOSCOPY (VATS)/WEDGE RESECTION;  Surgeon: Melrose Nakayama, MD;  Location: [place];  Service: Thoracic;  Laterality: Right;   Social History   Occupational History  . Occupation: Retired-[employer]     Employer: RETIRED  Tobacco Use  . Smoking status: Never Smoker  . Smokeless tobacco: Never  Used  Substance and Sexual Activity  . Alcohol use: No  . Drug use: No  . Sexual activity: Yes

## 2017-12-31 ENCOUNTER — Ambulatory Visit
Admission: RE | Admit: 2017-12-31 | Discharge: 2017-12-31 | Disposition: A | Discharge status: Home / Self Care | Payer: Medicare Other | Source: Ambulatory Visit | Attending: Orthopaedic Surgery | Admitting: Orthopaedic Surgery

## 2017-12-31 DIAGNOSIS — M419 Scoliosis, unspecified: Secondary | ICD-10-CM

## 2018-01-05 ENCOUNTER — Ambulatory Visit: Payer: Medicare Other | Admitting: Internal Medicine

## 2018-01-05 ENCOUNTER — Encounter: Payer: Self-pay | Admitting: Internal Medicine

## 2018-01-05 ENCOUNTER — Ambulatory Visit (INDEPENDENT_AMBULATORY_CARE_PROVIDER_SITE_OTHER): Payer: Medicare Other | Admitting: Orthopaedic Surgery

## 2018-01-05 ENCOUNTER — Encounter (INDEPENDENT_AMBULATORY_CARE_PROVIDER_SITE_OTHER): Payer: Self-pay | Admitting: Orthopaedic Surgery

## 2018-01-05 VITALS — BP 128/62 | HR 62 | Temp 97.8°F | Resp 14 | Ht 62.0 in | Wt 149.1 lb

## 2018-01-05 VITALS — BP 128/62 | Ht 62.0 in | Wt 149.0 lb

## 2018-01-05 DIAGNOSIS — Z79899 Other long term (current) drug therapy: Secondary | ICD-10-CM | POA: Diagnosis not present

## 2018-01-05 DIAGNOSIS — E785 Hyperlipidemia, unspecified: Secondary | ICD-10-CM | POA: Diagnosis not present

## 2018-01-05 DIAGNOSIS — M542 Cervicalgia: Secondary | ICD-10-CM

## 2018-01-05 DIAGNOSIS — M5020 Other cervical disc displacement, unspecified cervical region: Secondary | ICD-10-CM | POA: Diagnosis not present

## 2018-01-05 DIAGNOSIS — I10 Essential (primary) hypertension: Secondary | ICD-10-CM

## 2018-01-05 DIAGNOSIS — Z981 Arthrodesis status: Secondary | ICD-10-CM | POA: Diagnosis not present

## 2018-01-05 DIAGNOSIS — E039 Hypothyroidism, unspecified: Secondary | ICD-10-CM | POA: Diagnosis not present

## 2018-01-05 LAB — COMPREHENSIVE METABOLIC PANEL
ALT: 11 U/L (ref 0–35)
AST: 16 U/L (ref 0–37)
Albumin: 4.5 g/dL (ref 3.5–5.2)
Alkaline Phosphatase: 57 U/L (ref 39–117)
BUN: 12 mg/dL (ref 6–23)
CO2: 29 meq/L (ref 19–32)
CREATININE: 0.73 mg/dL (ref 0.40–1.20)
Calcium: 9.3 mg/dL (ref 8.4–10.5)
Chloride: 97 mEq/L (ref 96–112)
GFR: 81.16 mL/min (ref >60.00)
GLUCOSE: 109 mg/dL — AB (ref 70–99)
Potassium: 3.5 mEq/L (ref 3.5–5.1)
SODIUM: 135 meq/L (ref 135–145)
Total Bilirubin: 1.1 mg/dL (ref 0.2–1.2)
Total Protein: 7.3 g/dL (ref 6.0–8.3)

## 2018-01-05 LAB — LIPID PANEL
CHOL/HDL RATIO: 2
Cholesterol: 164 mg/dL (ref 0–200)
HDL: 73.9 mg/dL (ref >39.00)
LDL CALC: 71 mg/dL (ref 0–99)
NONHDL: 90.34
TRIGLYCERIDES: 99 mg/dL (ref 0.0–149.0)
VLDL: 19.8 mg/dL (ref 0.0–40.0)

## 2018-01-05 LAB — TSH: TSH: 1.83 u[IU]/mL (ref 0.35–4.50)

## 2018-01-05 NOTE — Progress Notes (Signed)
Pre visit review using our clinic review tool, if applicable. No additional management support is needed unless otherwise documented below in the visit note. 

## 2018-01-05 NOTE — Progress Notes (Signed)
Office Visit Note   Patient: Kathleen Thompson           Date of Birth: 09/26/1933           MRN: [id number] Visit Date: 01/05/2018              Requested by: Colon Branch, [address] [place], [place] [address] PCP: Colon Branch, MD   Assessment & Plan: Visit Diagnoses:  1. Cervicalgia   2. S/P cervical spinal fusion   3. Protrusion of cervical intervertebral disc     Plan: I gave her a copy of her lumbar MRI report.  Patient states she like to proceed with a repeat cervical MRI scan and is ready to proceed with surgery if it is likely to improve her left side neck pain that radiates to the shoulder.  Follow-Up Instructions: Follow-up after cervical MRI scan.  Orders:  No orders of the defined types were placed in this encounter.  No orders of the defined types were placed in this encounter.     Procedures: No procedures performed   Clinical Data: No additional findings.   Subjective: Chief Complaint  Patient presents with  . Lower Back - Pain, Follow-up    MRI review    HPI 84 year old female returns with ongoing problems with back pain and left buttocks pain that radiates down to her foot.  She has known scoliosis and just returned after MRI scan performed on 12/31/2017 which is available for review.  Her other problem is been her neck with pain in the left side of her neck that radiates to her shoulder with sharp electrical type pain intermittently that is associated with rotation to the left.  When she turns at direction she has to grab her neck with her hand across her trapezius to help push that she is able to make rotation of 30 degrees.  She states that at times it wakes her up.  She states she is unable to do any traveling when she gets the pain she has to stop she is used a cervical traction which sometimes helps and sometimes does not.  Previous cervical fusion Cloward technique at C5-6 and C6-7 which is solid.  She had spurring at C4-5 with  foraminal narrowing on the left noted on previous MRI last year.  Patient states the pain is progressed and she states she is ready to proceed with surgery on her neck to help her pain.  Review of Systems 14 point systems updated unchanged from last office visit on 12/18/2017.   Objective: Vital Signs: BP 128/62   Ht 5\' 2"  (1.575 m)   Wt 149 lb (67.6 kg)   BMI 27.25 kg/m   Physical Exam  Constitutional: She is oriented to person, place, and time. She appears well-developed.  HENT:  Head: Normocephalic.  Right Ear: External ear normal.  Left Ear: External ear normal.  Eyes: Pupils are equal, round, and reactive to light.  Neck: No tracheal deviation present. No thyromegaly present.  Cardiovascular: Normal rate.  Pulmonary/Chest: Effort normal.  Abdominal: Soft.  Neurological: She is alert and oriented to person, place, and time.  Skin: Skin is warm and dry.  Psychiatric: She has a normal mood and affect. Her behavior is normal.    Ortho Exam positive Spurling on the left negative on the right.  Upper 70 reflexes to 2+ and symmetrical.  She has rotation only 30 degrees tilting of 30 to 40 degrees to the  left with reproduction of her sharp pain.  Sensory testing the upper extremities normal no thenar hyperthenar atrophy no wrist wrist flexion or extension weakness.  No lower extremity hyperreflexia.  Specialty Comments:  No specialty comments available.  Imaging: CLINICAL DATA:  Status post fall. Pain radiates to the left buttock.  EXAM: MRI LUMBAR SPINE WITHOUT CONTRAST  TECHNIQUE: Multiplanar, multisequence MR imaging of the lumbar spine was performed. No intravenous contrast was administered.  COMPARISON:  None.  FINDINGS: Segmentation:  Standard.  Alignment:  Physiologic.  Vertebrae:  No fracture, evidence of discitis, or bone lesion.  Conus medullaris and cauda equina: Conus extends to the T12-L1 level. Conus and cauda equina appear normal.  Paraspinal  and other soft tissues: No acute paraspinal abnormality.  Disc levels:  Disc spaces: Degenerative disc disease with disc height loss at L2-3. Disc desiccation at L3-4 and L4-5.  T12-L1: No significant disc bulge. No evidence of neural foraminal stenosis. No central canal stenosis.  L1-L2: Broad right paracentral disc protrusion. No evidence of neural foraminal stenosis. No central canal stenosis.  L2-L3: Broad-based disc bulge with a broad right paracentral disc protrusion and right lateral recess stenosis. Mild spinal stenosis. Mild bilateral facet arthropathy. Mild right foraminal stenosis.  L3-L4: Minimal broad-based disc bulge. Moderate bilateral facet arthropathy. Right foraminal annular fissure. No central canal stenosis.  L4-L5: Broad-based disc bulge flattening the ventral thecal sac. Moderate bilateral facet arthropathy. Mild spinal stenosis. No evidence of neural foraminal stenosis.  L5-S1: No significant disc bulge. No evidence of neural foraminal stenosis. No central canal stenosis. Moderate bilateral facet arthropathy.  IMPRESSION: 1. At L2-3 there is a broad-based disc bulge with a broad right paracentral disc protrusion and right lateral recess stenosis. Mild spinal stenosis. Mild bilateral facet arthropathy. Mild right foraminal stenosis. 2. Lumbar spine spondylosis as described above.   Electronically Signed   By: Kathreen Devoid   On: 12/31/2017 15:39    PMFS History: Patient Active Problem List   Diagnosis Date Noted  . S/P cervical spinal fusion 01/05/2018  . Protrusion of cervical intervertebral disc 01/05/2018  . Stenosing tenosynovitis of wrist 01/11/2017  . Cervicalgia 01/11/2017  . Trochanteric bursitis, left hip 12/02/2016  . h/o TIA   11/18/2016  . Osteoporosis 06/22/2016  . PCP NOTES >>> 05/21/2015  . Annual physical exam 12/12/2014  . Granuloma RUL resected 03/14/13  12/19/2012  . ESOPHAGEAL STRICTURE 11/26/2009  . Family  history of malignant neoplasm of gastrointestinal tract 09/17/2009  . Cough variant asthma 09/11/2009  . PERSONAL HX COLONIC POLYPS 12/25/2008  . Dyspnea 03/17/2008  . PALPITATIONS, RECURRENT 02/02/2008  . ADENOMATOUS COLONIC POLYP 12/23/2007  . GERD 12/23/2007  . GASTROPARESIS 12/23/2007  . Hypothyroidism 03/12/2007  . Hyperlipemia 03/12/2007  . HTN (hypertension) 03/12/2007  . CAD (coronary artery disease) 03/12/2007  . INTERNAL HEMORRHOIDS 06/01/2006  . DIVERTICULOSIS, COLON 06/01/2006   Past Medical History:  Diagnosis Date  . Adenomatous colon polyp 02/2003  . Arthritis   . Blood transfusion   . Cataract   . Diverticulosis   . Esophageal stricture   . Gastroparesis   . GERD (gastroesophageal reflux disease)   . Hyperlipidemia   . Hypertension   . Hypothyroidism   . Internal hemorrhoid   . Iron deficiency anemia   . Osteoporosis   . PONV (postoperative nausea and vomiting)   . PVD (peripheral vascular disease) ([place])   . Stroke [facility])    TIA  . TIA (transient ischemic attack)     Family History  Problem Relation Age of Onset  . Diabetes Mother   . Heart attack Mother   . Heart attack Father   . Asthma Father   . Colon cancer Brother        at age 38  . Colon cancer Sister        at age 51  . Diabetes Maternal Aunt   . Diabetes Maternal Uncle   . Heart attack Sister   . Heart attack Brother   . Heart attack Brother   . Heart attack Brother   . Heart attack Brother   . Heart attack Sister   . Breast cancer Neg Hx     Past Surgical History:  Procedure Laterality Date  . ABDOMINAL HYSTERECTOMY    . APPENDECTOMY    . CERVICAL FUSION    . EYE SURGERY Bilateral 08/2016   cataracts  . LEFT HEART CATHETERIZATION WITH CORONARY ANGIOGRAM N/A 12/20/2012   Procedure: LEFT HEART CATHETERIZATION WITH CORONARY ANGIOGRAM;  Surgeon: Peter M Martinique, MD;  Location: [facility] CATH LAB;  Service: Cardiovascular;  Laterality: N/A;  . shoulder surgery     . VIDEO ASSISTED  THORACOSCOPY (VATS)/WEDGE RESECTION Right 03/14/2013   Procedure: VIDEO ASSISTED THORACOSCOPY (VATS)/WEDGE RESECTION;  Surgeon: Melrose Nakayama, MD;  Location: [place];  Service: Thoracic;  Laterality: Right;   Social History   Occupational History  . Occupation: Retired-[employer]     Employer: RETIRED  Tobacco Use  . Smoking status: Never Smoker  . Smokeless tobacco: Never Used  Substance and Sexual Activity  . Alcohol use: No  . Drug use: No  . Sexual activity: Yes

## 2018-01-05 NOTE — Patient Instructions (Signed)
GO TO THE LAB : Get the blood work     GO TO THE FRONT DESK Schedule your next appointment for a physical exam in 4 to 5 months.

## 2018-01-05 NOTE — Addendum Note (Signed)
Addended by: Meyer Cory on: 01/05/2018 05:23 PM   Modules accepted: Orders

## 2018-01-05 NOTE — Progress Notes (Signed)
Subjective:    Patient ID: Kathleen Thompson, female    DOB: 31-May-1934, 84 y.o.   MRN: [id number]  DOS:  01/05/2018 Type of visit - description : f/u Interval history: CAD: Good compliance with medications. DJD, MSK: Currently on Ultram only, she is trying to minimize her medications she takes.  Recently saw Dr. Aline August and due to back and neck pain she is going to have MRI. Hypothyroidism: Due for a TSH   Review of Systems Denies chest pain or difficulty breathing No nausea, vomiting, diarrhea. No blood in the stools.   Past Medical History:  Diagnosis Date  . Adenomatous colon polyp 02/2003  . Arthritis   . Blood transfusion   . Cataract   . Diverticulosis   . Esophageal stricture   . Gastroparesis   . GERD (gastroesophageal reflux disease)   . Hyperlipidemia   . Hypertension   . Hypothyroidism   . Internal hemorrhoid   . Iron deficiency anemia   . Osteoporosis   . PONV (postoperative nausea and vomiting)   . PVD (peripheral vascular disease) ([place])   . Stroke [facility])    TIA  . TIA (transient ischemic attack)     Past Surgical History:  Procedure Laterality Date  . ABDOMINAL HYSTERECTOMY    . APPENDECTOMY    . CERVICAL FUSION    . EYE SURGERY Bilateral 08/2016   cataracts  . LEFT HEART CATHETERIZATION WITH CORONARY ANGIOGRAM N/A 12/20/2012   Procedure: LEFT HEART CATHETERIZATION WITH CORONARY ANGIOGRAM;  Surgeon: Peter M Martinique, MD;  Location[facility] CATH LAB;  Service: Cardiovascular;  Laterality: N/A;  . shoulder surgery     . VIDEO ASSISTED THORACOSCOPY (VATS)/WEDGE RESECTION Right 03/14/2013   Procedure: VIDEO ASSISTED THORACOSCOPY (VATS)/WEDGE RESECTION;  Surgeon: Melrose Nakayama, MD;  Location: [place];  Service: Thoracic;  Laterality: Right;    Social History   Socioeconomic History  . Marital status: Married    Spouse name: Not on file  . Number of children: 2  . Years of education: Not on file  . Highest education level: Not on file  Occupational  History  . Occupation: Psychologist, clinical: RETIRED  Social Needs  . Financial resource strain: Not on file  . Food insecurity:    Worry: Not on file    Inability: Not on file  . Transportation needs:    Medical: Not on file    Non-medical: Not on file  Tobacco Use  . Smoking status: Never Smoker  . Smokeless tobacco: Never Used  Substance and Sexual Activity  . Alcohol use: No  . Drug use: No  . Sexual activity: Yes  Lifestyle  . Physical activity:    Days per week: Not on file    Minutes per session: Not on file  . Stress: Not on file  Relationships  . Social connections:    Talks on phone: Not on file    Gets together: Not on file    Attends religious service: Not on file    Active member of club or organization: Not on file    Attends meetings of clubs or organizations: Not on file    Relationship status: Not on file  . Intimate partner violence:    Fear of current or ex partner: Not on file    Emotionally abused: Not on file    Physically abused: Not on file    Forced sexual activity: Not on file  Other Topics Concern  . Not on file  Social History Narrative   Household-- pt and wife   2 boys , one in [place], on in [place]    Only surviving sibling out of seven      Allergies as of 01/05/2018      Reactions   Clindamycin/lincomycin Rash   Hydrocodone Hives   Rofecoxib    hives   Latex Rash   Pt states that latex causes blisters to skin      Medication List        Accurate as of 01/05/18 11:59 PM. Always use your most recent med list.          aspirin 81 MG tablet Take 81 mg by mouth daily.   CALCIUM PO Take 1 tablet by mouth 2 (two) times daily.   clopidogrel 75 MG tablet Commonly known as:  PLAVIX Take 1 tablet (75 mg total) by mouth daily.   CO Q-10 PO Take 1 tablet by mouth daily.   diclofenac sodium 1 % Gel Commonly known as:  VOLTAREN Apply 2 g topically 4 (four) times daily.   isosorbide mononitrate 30 MG 24 hr tablet Commonly  known as:  IMDUR Take 1 tablet (30 mg total) by mouth daily.   losartan-hydrochlorothiazide 100-12.5 MG tablet Commonly known as:  HYZAAR Take 1/2 tablet by mouth twice daily   magnesium 30 MG tablet Take 30 mg by mouth daily.   pantoprazole 40 MG tablet Commonly known as:  PROTONIX TAKE 1 TABLET BY MOUTH ONCE DAILY BEFORE BREAKFAST   rosuvastatin 20 MG tablet Commonly known as:  CRESTOR Take 1 tablet (20 mg total) by mouth daily.   SYNTHROID 50 MCG tablet Generic drug:  levothyroxine Take 1 tablet (50 mcg total) by mouth daily before breakfast.   traMADol 50 MG tablet Commonly known as:  ULTRAM TAKE 1 TABLET BY MOUTH TWICE DAILY AS NEEDED FOR MODERATE PAIN   vitamin D (CHOLECALCIFEROL) 400 units tablet Take 400 Units by mouth daily.          Objective:   Physical Exam BP 128/62 (BP Location: Left Arm, Patient Position: Sitting, Cuff Size: Small)   Pulse 62   Temp 97.8 F (36.6 C) (Oral)   Resp 14   Ht 5\' 2"  (1.575 m)   Wt 149 lb 2 oz (67.6 kg)   SpO2 97%   BMI 27.28 kg/m  General:   Well developed, well nourished . NAD.  HEENT:  Normocephalic . Face symmetric, atraumatic Lungs:  CTA B Normal respiratory effort, no intercostal retractions, no accessory muscle use. Heart: RRR,  no murmur.  No pretibial edema bilaterally  Skin: Not pale. Not jaundice Neurologic:  alert & oriented X3.  Speech normal, gait appropriate for age and unassisted Psych--  Cognition and judgment appear intact.  Cooperative with normal attention span and concentration.  Behavior appropriate. No anxious or depressed appearing.      Assessment & Plan:    Assessment HTN Hypothyroidism Hyperlipidemia CV: --CAD cath 12/2012 single-vessel CAD,rx CV RF control, on Plavix, imdur --TIA remotely , apparently amaurosis fugax, was a started on on plavix per PCP, no further sx since. --On ASA and Plavix: Discuss 11/17/2016 Osteoporosis -  DEXAs @ [facility], T score -3.9  (12/2015).Prolia #1:  07/2016 MSK: Dr Lorin Mercy --DJD- s/p neck and shoulder surgeries  --knee pain: on tramadol rx per pcp GI: GED, gastroparesis, diverticulosis, esophageal stricture H/o Iron deficiency anemia H/o granuloma RUL, resected 03-14-13  PLAN: HTN: BP today is very good on Hyzaar, Imdur, check a CMP High  cholesterol: On Crestor, check a FLP Hypothyroidism: Continue Synthroid, check a TSH DJD: Currently taking tramadol only, is trying to avoid excessive medications thus not taking Tylenol.  Also due to the side effect profile from the pharmacy printout she is not using topical NSAIDs.  Encouraged to do, risk of  topical NSAIDs is low. UDS and contract due to tramadol. RLS?  Never been formally diagnosed but today reports sxs c/w the dx. symptoms are sometimes very bothersome, declined any medication. Colon cancer screening discussed. Not ready for a cscope  RTC, CPX, 4 to 5 months.

## 2018-01-05 NOTE — Assessment & Plan Note (Signed)
CCS: She is due for colonoscopy but is not ready for it, has a # of other medical conditions.  Knows to call me when ready.

## 2018-01-06 NOTE — Assessment & Plan Note (Signed)
HTN: BP today is very good on Hyzaar, Imdur, check a CMP High cholesterol: On Crestor, check a FLP Hypothyroidism: Continue Synthroid, check a TSH DJD: Currently taking tramadol only, is trying to avoid excessive medications thus not taking Tylenol.  Also due to the side effect profile from the pharmacy printout she is not using topical NSAIDs.  Encouraged to do, risk of  topical NSAIDs is low. UDS and contract due to tramadol. RLS?  Never been formally diagnosed but today reports sxs c/w the dx. symptoms are sometimes very bothersome, declined any medication. Colon cancer screening discussed. Not ready for a cscope  RTC, CPX, 4 to 5 months.

## 2018-01-08 LAB — PAIN MGMT, PROFILE 8 W/CONF, U
6 Acetylmorphine: NEGATIVE ng/mL (ref <10)
ALPHAHYDROXYALPRAZOLAM: NEGATIVE ng/mL (ref <25)
AMINOCLONAZEPAM: NEGATIVE ng/mL (ref <25)
Alcohol Metabolites: NEGATIVE ng/mL (ref <500)
Alphahydroxymidazolam: NEGATIVE ng/mL (ref <50)
Alphahydroxytriazolam: NEGATIVE ng/mL (ref <50)
Amphetamines: NEGATIVE ng/mL (ref <500)
BUPRENORPHINE, URINE: NEGATIVE ng/mL (ref <5)
Benzodiazepines: POSITIVE ng/mL — AB (ref <100)
CREATININE: 44.8 mg/dL
Cocaine Metabolite: NEGATIVE ng/mL (ref <150)
Hydroxyethylflurazepam: NEGATIVE ng/mL (ref <50)
LORAZEPAM: NEGATIVE ng/mL (ref <50)
MDMA: NEGATIVE ng/mL (ref <500)
Marijuana Metabolite: NEGATIVE ng/mL (ref <20)
Nordiazepam: NEGATIVE ng/mL (ref <50)
OXIDANT: NEGATIVE ug/mL (ref <200)
Opiates: NEGATIVE ng/mL (ref <100)
Oxazepam: 75 ng/mL — ABNORMAL HIGH (ref <50)
Oxycodone: NEGATIVE ng/mL (ref <100)
PH: 7.11 (ref 4.5–9.0)
TEMAZEPAM: 130 ng/mL — AB (ref <50)

## 2018-01-08 LAB — PAIN MGMT, TRAMADOL W/MEDMATCH, U
Desmethyltramadol: 2833 ng/mL — ABNORMAL HIGH (ref <100)
Tramadol: 4612 ng/mL — ABNORMAL HIGH (ref <100)

## 2018-01-09 ENCOUNTER — Ambulatory Visit (HOSPITAL_BASED_OUTPATIENT_CLINIC_OR_DEPARTMENT_OTHER)
Admission: RE | Admit: 2018-01-09 | Discharge: 2018-01-09 | Disposition: A | Discharge status: Home / Self Care | Payer: Medicare Other | Source: Ambulatory Visit | Attending: Orthopaedic Surgery | Admitting: Orthopaedic Surgery

## 2018-01-09 DIAGNOSIS — M4802 Spinal stenosis, cervical region: Secondary | ICD-10-CM | POA: Insufficient documentation

## 2018-01-09 DIAGNOSIS — Z981 Arthrodesis status: Secondary | ICD-10-CM | POA: Insufficient documentation

## 2018-01-09 DIAGNOSIS — M47812 Spondylosis without myelopathy or radiculopathy, cervical region: Secondary | ICD-10-CM | POA: Insufficient documentation

## 2018-01-09 DIAGNOSIS — M542 Cervicalgia: Secondary | ICD-10-CM | POA: Insufficient documentation

## 2018-01-11 ENCOUNTER — Other Ambulatory Visit: Payer: Self-pay | Admitting: Internal Medicine

## 2018-01-26 ENCOUNTER — Encounter (INDEPENDENT_AMBULATORY_CARE_PROVIDER_SITE_OTHER): Payer: Self-pay | Admitting: Orthopaedic Surgery

## 2018-01-26 ENCOUNTER — Ambulatory Visit (INDEPENDENT_AMBULATORY_CARE_PROVIDER_SITE_OTHER): Payer: Medicare Other | Admitting: Orthopaedic Surgery

## 2018-01-26 VITALS — BP 131/72 | HR 83 | Ht 62.0 in | Wt 149.0 lb

## 2018-01-26 DIAGNOSIS — M47812 Spondylosis without myelopathy or radiculopathy, cervical region: Secondary | ICD-10-CM | POA: Diagnosis not present

## 2018-01-26 MED ORDER — DULOXETINE HCL 20 MG PO CPEP: 20.0000 mg | ORAL_CAPSULE | Freq: Every day | ORAL | 0 refills | Status: DC

## 2018-01-26 NOTE — Progress Notes (Signed)
Office Visit Note   Patient: Kathleen Thompson           Date of Birth: 1934/03/25           MRN: [id number] Visit Date: 01/26/2018              Requested by: Colon Branch, [address] [place], [place] [address] PCP: Colon Branch, MD   Assessment & Plan: Visit Diagnoses:  1. Arthropathy of cervical spine     Plan: MRI scan is reviewed with patient I gave her a copy of her report.  She has osteoarthritis at the C1-C2 articulation which may be consistent with her craniocervical pain syndrome.  She does have degenerative facet changes which has progressed to some degree on the left at C3-4 and mild to moderate by foraminal narrowing without compression at C4-5.  With her osteoarthritis changes at C1-2 arthritis panel I will call her with the results.  We will try some Cymbalta low-dose at 20 mg daily.  I will recheck her in 2 months.  If her arthritis panel is positive inflammatory arthropathy she may require rheumatology referral.  Follow-Up Instructions: Return in about 1 month (around 02/23/2018).   Orders:  Orders Placed This Encounter  Procedures  . Antinuclear Antib (ANA)  . Uric acid  . Sed Rate (ESR)  . Rheumatoid Factor   Meds ordered this encounter  Medications  . DULoxetine (CYMBALTA) 20 MG capsule    Sig: Take 1 capsule (20 mg total) by mouth daily.    Dispense:  30 capsule    Refill:  0      Procedures: No procedures performed   Clinical Data: No additional findings.   Subjective: Chief Complaint  Patient presents with  . Neck - Pain, Follow-up    MRI cervical spine review    HPI patient returns for follow-up of left upper neck pain that radiates on the left side to her ear temporal and occipital region.  Sometimes into her shoulder.  She got relief of some of her shoulder symptoms with a subacromial injection in the left shoulder.  Pain rarely radiates to her fingers.  She is had previous cervical fusion which is solid and MRI scan is  available for review.  She does have some ongoing mild disc protrusions lumbar and has had some pain that radiates into her trochanters.  Review of Systems 14 point update unchanged from 12/18/2017 office visit other than as mentioned in HPI.  Solid fusion C5-6 C6-7.  Facet arthropathy more on the left at C3-4 and lesser degree C4-5.  C1-2 arthropathy.   Objective: Vital Signs: BP 131/72   Pulse 83   Ht '5\' 2"'$  (1.575 m)   Wt 149 lb (67.6 kg)   BMI 27.25 kg/m   Physical Exam  Constitutional: She is oriented to person, place, and time. She appears well-developed.  HENT:  Head: Normocephalic.  Right Ear: External ear normal.  Left Ear: External ear normal.  Eyes: Pupils are equal, round, and reactive to light.  Neck: No tracheal deviation present. No thyromegaly present.  Cardiovascular: Normal rate.  Pulmonary/Chest: Effort normal.  Abdominal: Soft.  Neurological: She is alert and oriented to person, place, and time.  Skin: Skin is warm and dry.  Psychiatric: She has a normal mood and affect. Her behavior is normal.    Ortho Exam patient has reproduction of her pain with rotation to the left.  Upper and lower extremity reflexes are 2+  and symmetrical mild greater trochanteric tenderness.  Symmetrical biceps triceps strength normal wrist flexion extension strength.  Well-healed cervical incision.  No increased pain with cervical compression she gets reproduction of her pain with rotation to the left.  Specialty Comments:  No specialty comments available.  Imaging:CLINICAL DATA:  Neck pain over the last year, radiating to the left arm. Left arm weakness.  EXAM: MRI CERVICAL SPINE WITHOUT CONTRAST  TECHNIQUE: Multiplanar, multisequence MR imaging of the cervical spine was performed. No intravenous contrast was administered.  COMPARISON:  Radiography 12/18/2017.  MRI 12/16/2016.  FINDINGS: Alignment: Straightening of the normal cervical lordosis.  Vertebrae: Solid ACDF  C5 through C7.  Cord: No cord compression or primary cord lesion.  Posterior Fossa, vertebral arteries, paraspinal tissues: Negative  Disc levels:  Hypertrophic degenerative change at the C1-2 articulation with mild encroachment upon the craniocervical junction region but no neural compression.  C2-3: Mild bulging of the disc. Mild facet degeneration on the right. No compressive stenosis.  C3-4: Spondylosis with endplate osteophytes and bulging of the disc. Facet osteoarthritis worse on the left. No compressive central canal stenosis. Foraminal stenosis worse on the left. The left C4 nerve could be affected.  C4-5: Mild bulging of the disc and uncovertebral prominence. Facet osteoarthritis bilaterally. No compressive central canal stenosis. Mild to moderate bilateral foraminal narrowing.  C5 through C7: Distant ACDF. Solid fusion with wide patency of the canal and foramina.  C7-T1: Minimal bulging of the disc. Minimal facet degeneration. No canal or foraminal stenosis.  No discernible change since the study of 12/16/2016, other than some progressive change of the facet arthropathy on the left at C3-4.  IMPRESSION: Previous ACDF C5 through C7 is solid with wide patency of the canal and foramina at those levels.  Osteoarthritis of the C1-2 articulation which could contribute to craniocervical pain syndromes.  Degenerative spondylosis and facet osteoarthritis at C3-4 and C4-5. The facet arthropathy is most pronounced on the left at C3-4, and has progressed somewhat since the previous study. Left foraminal narrowing at C3-4 could possibly affect the left C4 nerve. C4-5 shows mild to moderate bilateral foraminal narrowing, not definitely compressive.   Electronically Signed   By: Nelson Chimes M.D.   On: 01/10/2018 07:22     PMFS History: Patient Active Problem List   Diagnosis Date Noted  . S/P cervical spinal fusion 01/05/2018  . Protrusion of  cervical intervertebral disc 01/05/2018  . Stenosing tenosynovitis of wrist 01/11/2017  . Cervicalgia 01/11/2017  . Trochanteric bursitis, left hip 12/02/2016  . h/o TIA   11/18/2016  . Osteoporosis 06/22/2016  . PCP NOTES >>> 05/21/2015  . Annual physical exam 12/12/2014  . Granuloma RUL resected 03/14/13  12/19/2012  . ESOPHAGEAL STRICTURE 11/26/2009  . Family history of malignant neoplasm of gastrointestinal tract 09/17/2009  . Cough variant asthma 09/11/2009  . PERSONAL HX COLONIC POLYPS 12/25/2008  . Dyspnea 03/17/2008  . PALPITATIONS, RECURRENT 02/02/2008  . ADENOMATOUS COLONIC POLYP 12/23/2007  . GERD 12/23/2007  . GASTROPARESIS 12/23/2007  . Hypothyroidism 03/12/2007  . Hyperlipemia 03/12/2007  . HTN (hypertension) 03/12/2007  . CAD (coronary artery disease) 03/12/2007  . INTERNAL HEMORRHOIDS 06/01/2006  . DIVERTICULOSIS, COLON 06/01/2006   Past Medical History:  Diagnosis Date  . Adenomatous colon polyp 02/2003  . Arthritis   . Blood transfusion   . Cataract   . Diverticulosis   . Esophageal stricture   . Gastroparesis   . GERD (gastroesophageal reflux disease)   . Hyperlipidemia   . Hypertension   .  Hypothyroidism   . Internal hemorrhoid   . Iron deficiency anemia   . Osteoporosis   . PONV (postoperative nausea and vomiting)   . PVD (peripheral vascular disease) ([place])   . Stroke [facility])    TIA  . TIA (transient ischemic attack)     Family History  Problem Relation Age of Onset  . Diabetes Mother   . Heart attack Mother   . Heart attack Father   . Asthma Father   . Colon cancer Brother        at age 26  . Colon cancer Sister        at age 82  . Diabetes Maternal Aunt   . Diabetes Maternal Uncle   . Heart attack Sister   . Heart attack Brother   . Heart attack Brother   . Heart attack Brother   . Heart attack Brother   . Heart attack Sister   . Breast cancer Neg Hx     Past Surgical History:  Procedure Laterality Date  . ABDOMINAL HYSTERECTOMY     . APPENDECTOMY    . CERVICAL FUSION    . EYE SURGERY Bilateral 08/2016   cataracts  . LEFT HEART CATHETERIZATION WITH CORONARY ANGIOGRAM N/A 12/20/2012   Procedure: LEFT HEART CATHETERIZATION WITH CORONARY ANGIOGRAM;  Surgeon: Peter M Martinique, MD;  Location: [facility] CATH LAB;  Service: Cardiovascular;  Laterality: N/A;  . shoulder surgery     . VIDEO ASSISTED THORACOSCOPY (VATS)/WEDGE RESECTION Right 03/14/2013   Procedure: VIDEO ASSISTED THORACOSCOPY (VATS)/WEDGE RESECTION;  Surgeon: Melrose Nakayama, MD;  Location: [place];  Service: Thoracic;  Laterality: Right;   Social History   Occupational History  . Occupation: Retired-[employer]     Employer: RETIRED  Tobacco Use  . Smoking status: Never Smoker  . Smokeless tobacco: Never Used  Substance and Sexual Activity  . Alcohol use: No  . Drug use: No  . Sexual activity: Yes

## 2018-01-28 LAB — ANTI-NUCLEAR AB-TITER (ANA TITER): ANA Titer 1: 1:80 {titer} — ABNORMAL HIGH

## 2018-01-28 LAB — RHEUMATOID FACTOR: Rhuematoid fact SerPl-aCnc: <14 IU/mL (ref <14)

## 2018-01-28 LAB — URIC ACID: URIC ACID, SERUM: 2 mg/dL — AB (ref 2.5–7.0)

## 2018-01-28 LAB — ANA: Anti Nuclear Antibody(ANA): POSITIVE — AB

## 2018-01-28 LAB — SEDIMENTATION RATE: Sed Rate: 14 mm/h (ref 0–30)

## 2018-01-28 NOTE — Addendum Note (Signed)
Addended by: Meyer Cory on: 01/28/2018 01:15 PM   Modules accepted: Orders

## 2018-01-28 NOTE — Progress Notes (Signed)
Entered referral to Dr. Estanislado Pandy for consult. Patient with cervical arthropathy and positive ANA.

## 2018-02-04 ENCOUNTER — Ambulatory Visit: Payer: Medicare Other | Admitting: Internal Medicine

## 2018-02-04 ENCOUNTER — Encounter: Payer: Self-pay | Admitting: Internal Medicine

## 2018-02-04 VITALS — BP 116/68 | HR 75 | Temp 98.1°F | Resp 16 | Ht 62.0 in | Wt 149.0 lb

## 2018-02-04 DIAGNOSIS — H6123 Impacted cerumen, bilateral: Secondary | ICD-10-CM

## 2018-02-04 NOTE — Patient Instructions (Signed)
Put H2O2 (peroxide) daily to both ears  Will refer you to the ENT doctor

## 2018-02-04 NOTE — Progress Notes (Signed)
Subjective:    Patient ID: Kathleen Thompson, female    DOB: 1934/07/01, 84 y.o.   MRN: [id number]  DOS:  02/04/2018 Type of visit - description : Acute visit Interval history: Patient noted decreased right ear hearing, she felt like she was "in a drum" .  Used some eardrops OTC and now is worse.   Review of Systems Denies any actual ear pain, no ear discharge or bleeding No recent URI  Past Medical History:  Diagnosis Date  . Adenomatous colon polyp 02/2003  . Arthritis   . Blood transfusion   . Cataract   . Diverticulosis   . Esophageal stricture   . Gastroparesis   . GERD (gastroesophageal reflux disease)   . Hyperlipidemia   . Hypertension   . Hypothyroidism   . Internal hemorrhoid   . Iron deficiency anemia   . Osteoporosis   . PONV (postoperative nausea and vomiting)   . PVD (peripheral vascular disease) ([place])   . Stroke [facility])    TIA  . TIA (transient ischemic attack)     Past Surgical History:  Procedure Laterality Date  . ABDOMINAL HYSTERECTOMY    . APPENDECTOMY    . CERVICAL FUSION    . EYE SURGERY Bilateral 08/2016   cataracts  . LEFT HEART CATHETERIZATION WITH CORONARY ANGIOGRAM N/A 12/20/2012   Procedure: LEFT HEART CATHETERIZATION WITH CORONARY ANGIOGRAM;  Surgeon: Peter M Martinique, MD;  Location[facility] C[facility] LAB;  Service: Cardiovascular;  Laterality: N/A;  . shoulder surgery     . VIDEO ASSISTED THORACOSCOPY (VATS)/WEDGE RESECTION Right 03/14/2013   Procedure: VIDEO ASSISTED THORACOSCOPY (VATS)/WEDGE RESECTION;  Surgeon: Melrose Nakayama, MD;  Location: [place];  Service: Thoracic;  Laterality: Right;    Social History   Socioeconomic History  . Marital status: Married    Spouse name: Not on file  . Number of children: 2  . Years of education: Not on file  . Highest education level: Not on file  Occupational History  . Occupation: Psychologist, clinical: RETIRED  Social Needs  . Financial resource strain: Not on file  . Food insecurity:   Worry: Not on file    Inability: Not on file  . Transportation needs:    Medical: Not on file    Non-medical: Not on file  Tobacco Use  . Smoking status: Never Smoker  . Smokeless tobacco: Never Used  Substance and Sexual Activity  . Alcohol use: No  . Drug use: No  . Sexual activity: Yes  Lifestyle  . Physical activity:    Days per week: Not on file    Minutes per session: Not on file  . Stress: Not on file  Relationships  . Social connections:    Talks on phone: Not on file    Gets together: Not on file    Attends religious service: Not on file    Active member of club or organization: Not on file    Attends meetings of clubs or organizations: Not on file    Relationship status: Not on file  . Intimate partner violence:    Fear of current or ex partner: Not on file    Emotionally abused: Not on file    Physically abused: Not on file    Forced sexual activity: Not on file  Other Topics Concern  . Not on file  Social History Narrative   Household-- pt and wife   2 boys , one in [place], on in [place]  Only surviving sibling out of seven      Allergies as of 02/04/2018      Reactions   Clindamycin/lincomycin Rash   Hydrocodone Hives   Rofecoxib    hives   Latex Rash   Pt states that latex causes blisters to skin      Medication List        Accurate as of 02/04/18 11:59 PM. Always use your most recent med list.          aspirin 81 MG tablet Take 81 mg by mouth daily.   CALCIUM PO Take 1 tablet by mouth 2 (two) times daily.   clopidogrel 75 MG tablet Commonly known as:  PLAVIX Take 1 tablet (75 mg total) by mouth daily.   CO Q-10 PO Take 1 tablet by mouth daily.   diclofenac sodium 1 % Gel Commonly known as:  VOLTAREN Apply 2 g topically 4 (four) times daily.   DULoxetine 20 MG capsule Commonly known as:  CYMBALTA Take 1 capsule (20 mg total) by mouth daily.   isosorbide mononitrate 30 MG 24 hr tablet Commonly known as:  IMDUR Take 1 tablet (30 mg  total) by mouth daily.   losartan-hydrochlorothiazide 100-12.5 MG tablet Commonly known as:  HYZAAR Take 1/2 tablet by mouth twice daily   magnesium 30 MG tablet Take 30 mg by mouth daily.   pantoprazole 40 MG tablet Commonly known as:  PROTONIX TAKE 1 TABLET BY MOUTH ONCE DAILY BEFORE BREAKFAST   rosuvastatin 20 MG tablet Commonly known as:  CRESTOR Take 1 tablet (20 mg total) by mouth daily.   SYNTHROID 50 MCG tablet Generic drug:  levothyroxine Take 1 tablet (50 mcg total) by mouth daily before breakfast.   traMADol 50 MG tablet Commonly known as:  ULTRAM TAKE 1 TABLET BY MOUTH TWICE DAILY AS NEEDED FOR MODERATE PAIN   vitamin D (CHOLECALCIFEROL) 400 units tablet Take 400 Units by mouth daily.          Objective:   Physical Exam BP 116/68 (BP Location: Left Arm, Patient Position: Sitting, Cuff Size: Small)   Pulse 75   Temp 98.1 F (36.7 C) (Oral)   Resp 16   Ht 5\' 2"  (1.575 m)   Wt 149 lb (67.6 kg)   SpO2 96%   BMI 27.25 kg/m  General:   Well developed, NAD, see BMI.  HEENT:  Normocephalic . Face symmetric, atraumatic Ear exam: Both sides have cerumen impaction.   Skin: Not pale. Not jaundice Neurologic:  alert & oriented X3.  Speech normal, gait appropriate for age and unassisted Psych--  Cognition and judgment appear intact.  Cooperative with normal attention span and concentration.  Behavior appropriate. No anxious or depressed appearing.      Assessment & Plan:   Assessment HTN Hypothyroidism Hyperlipidemia CV: --CAD cath 12/2012 single-vessel CAD,rx CV RF control, on Plavix, imdur --TIA remotely , apparently amaurosis fugax, was a started on on plavix per PCP, no further sx since. --On ASA and Plavix: Discuss 11/17/2016 Osteoporosis -  DEXAs @ [facility], T score -3.9 (12/2015).Prolia #1:  07/2016 [facility]: Dr Lorin Mercy --DJD- s/p neck and shoulder surgeries ; + DJD cervical spine --knee pain: on tramadol rx per pcp GI: GED, gastroparesis,  diverticulosis, esophageal stricture H/o Iron deficiency anemia H/o granuloma RUL, resected 03-14-13  PLAN: Cerumen impaction: Attempted lavage on the right with minimal success, unable to remove any wax with a spoon.  Her canals are narrow and she is packed.  Refer to ENT, peroxide  until then.

## 2018-02-04 NOTE — Progress Notes (Signed)
Pre visit review using our clinic review tool, if applicable. No additional management support is needed unless otherwise documented below in the visit note. 

## 2018-02-05 ENCOUNTER — Telehealth (INDEPENDENT_AMBULATORY_CARE_PROVIDER_SITE_OTHER): Payer: Self-pay | Admitting: Radiology

## 2018-02-05 NOTE — Telephone Encounter (Signed)
error 

## 2018-02-06 NOTE — Assessment & Plan Note (Signed)
Cerumen impaction: Attempted lavage on the right with minimal success, unable to remove any wax with a spoon.  Her canals are narrow and she is packed.  Refer to ENT, peroxide until then.

## 2018-02-23 ENCOUNTER — Encounter (INDEPENDENT_AMBULATORY_CARE_PROVIDER_SITE_OTHER): Payer: Self-pay | Admitting: Orthopaedic Surgery

## 2018-02-23 ENCOUNTER — Ambulatory Visit (INDEPENDENT_AMBULATORY_CARE_PROVIDER_SITE_OTHER): Payer: Medicare Other | Admitting: Orthopaedic Surgery

## 2018-02-23 VITALS — BP 136/71 | HR 75 | Ht 62.0 in | Wt 149.0 lb

## 2018-02-23 DIAGNOSIS — M47812 Spondylosis without myelopathy or radiculopathy, cervical region: Secondary | ICD-10-CM

## 2018-02-23 NOTE — Progress Notes (Signed)
Office Visit Note   Patient: Kathleen Thompson           Date of Birth: 07/30/1934           MRN: [id number] Visit Date: 02/23/2018              Requested by: Colon Branch, [address] [place],  [address] PCP: Colon Branch, MD   Assessment & Plan: Visit Diagnoses:  1. Arthropathy of cervical spine     Plan: Proceed with rheumatology referral for evaluation of abnormal lab work and recommendations for treatment.  She does have some facet arthritis at C3-4 and C4-5 above her solid fusion C5-C7.  We discussed at this point no cervical surgery is recommended.  We can check her back again on an as-needed basis.  Follow-Up Instructions: Return if symptoms worsen or fail to improve.   Orders:  No orders of the defined types were placed in this encounter.  No orders of the defined types were placed in this encounter.     Procedures: No procedures performed   Clinical Data: No additional findings.   Subjective: Chief Complaint  Patient presents with  . Neck - Pain    HPI 84 year old female returns for cervical spondylosis with mild elevation in ANA 1:80 with nucleolar pattern.  Sed rate was normal uric acid was normal rheumatoid factor was negative.  MRI scan showed C1-C2 arthropathy without compression.  She occasionally taking tramadol.  Other stronger medication she does not tolerate.  We tried some Cymbalta and she states it made her feel nervous or jittery and could not sleep.  She is had some pain in her foot on the left on the dorsum but states recently it is improved.  Patient has rheumatology appointment scheduled next month on the 28th.  Review of Systems 14 system update unchanged from 12/18/2017, other than as mentioned in HPI.   Objective: Vital Signs: BP 136/71   Pulse 75   Ht 5\' 2"  (1.575 m)   Wt 149 lb (67.6 kg)   BMI 27.25 kg/m   Physical Exam  Constitutional: She is oriented to person, place, and time. She appears well-developed.    HENT:  Head: Normocephalic.  Right Ear: External ear normal.  Left Ear: External ear normal.  Eyes: Pupils are equal, round, and reactive to light.  Neck: No tracheal deviation present. No thyromegaly present.  Cardiovascular: Normal rate.  Pulmonary/Chest: Effort normal.  Abdominal: Soft.  Neurological: She is alert and oriented to person, place, and time.  Skin: Skin is warm and dry.  Psychiatric: She has a normal mood and affect. Her behavior is normal.    Ortho Exam patient has discomfort with cervical rotation right and left 45 degrees.  Upper extremity reflexes are 2+ and symmetrical no isolated motor weakness no lower extremity hyperreflexia.  There is mild swelling the lower extremities.  Specialty Comments:  No specialty comments available.  Imaging: No results found.   PMFS History: Patient Active Problem List   Diagnosis Date Noted  . S/P cervical spinal fusion 01/05/2018  . Protrusion of cervical intervertebral disc 01/05/2018  . Stenosing tenosynovitis of wrist 01/11/2017  . Cervicalgia 01/11/2017  . Trochanteric bursitis, left hip 12/02/2016  . h/o TIA   11/18/2016  . Osteoporosis 06/22/2016  . PCP NOTES >>> 05/21/2015  . Annual physical exam 12/12/2014  . Granuloma RUL resected 03/14/13  12/19/2012  . ESOPHAGEAL STRICTURE 11/26/2009  . Family history of malignant neoplasm  of gastrointestinal tract 09/17/2009  . Cough variant asthma 09/11/2009  . PERSONAL HX COLONIC POLYPS 12/25/2008  . Dyspnea 03/17/2008  . PALPITATIONS, RECURRENT 02/02/2008  . ADENOMATOUS COLONIC POLYP 12/23/2007  . GERD 12/23/2007  . GASTROPARESIS 12/23/2007  . Hypothyroidism 03/12/2007  . Hyperlipemia 03/12/2007  . HTN (hypertension) 03/12/2007  . CAD (coronary artery disease) 03/12/2007  . INTERNAL HEMORRHOIDS 06/01/2006  . DIVERTICULOSIS, COLON 06/01/2006   Past Medical History:  Diagnosis Date  . Adenomatous colon polyp 02/2003  . Arthritis   . Blood transfusion   .  Cataract   . Diverticulosis   . Esophageal stricture   . Gastroparesis   . GERD (gastroesophageal reflux disease)   . Hyperlipidemia   . Hypertension   . Hypothyroidism   . Internal hemorrhoid   . Iron deficiency anemia   . Osteoporosis   . PONV (postoperative nausea and vomiting)   . PVD (peripheral vascular disease) ([place])   . Stroke [facility])    TIA  . TIA (transient ischemic attack)     Family History  Problem Relation Age of Onset  . Diabetes Mother   . Heart attack Mother   . Heart attack Father   . Asthma Father   . Colon cancer Brother        at age 19  . Colon cancer Sister        at age 73  . Diabetes Maternal Aunt   . Diabetes Maternal Uncle   . Heart attack Sister   . Heart attack Brother   . Heart attack Brother   . Heart attack Brother   . Heart attack Brother   . Heart attack Sister   . Breast cancer Neg Hx     Past Surgical History:  Procedure Laterality Date  . ABDOMINAL HYSTERECTOMY    . APPENDECTOMY    . CERVICAL FUSION    . EYE SURGERY Bilateral 08/2016   cataracts  . LEFT HEART CATHETERIZATION WITH CORONARY ANGIOGRAM N/A 12/20/2012   Procedure: LEFT HEART CATHETERIZATION WITH CORONARY ANGIOGRAM;  Surgeon: Peter M Martinique, MD;  Location[facility] CATH LAB;  Service: Cardiovascular;  Laterality: N/A;  . shoulder surgery     . VIDEO ASSISTED THORACOSCOPY (VATS)/WEDGE RESECTION Right 03/14/2013   Procedure: VIDEO ASSISTED THORACOSCOPY (VATS)/WEDGE RESECTION;  Surgeon: Melrose Nakayama, MD;  Location: [place];  Service: Thoracic;  Laterality: Right;   Social History   Occupational History  . Occupation: Retired-[employer]     Employer: RETIRED  Tobacco Use  . Smoking status: Never Smoker  . Smokeless tobacco: Never Used  Substance and Sexual Activity  . Alcohol use: No  . Drug use: No  . Sexual activity: Yes

## 2018-03-04 ENCOUNTER — Other Ambulatory Visit: Payer: Self-pay | Admitting: Internal Medicine

## 2018-03-04 NOTE — Telephone Encounter (Signed)
Pt is requesting refill on tramadol. Paz Pt.   Last OV: 02/04/2018 Last Fill: 12/07/2017 #180 and 0RF UDS: 01/05/2018 Low risk

## 2018-03-10 ENCOUNTER — Telehealth: Payer: Self-pay | Admitting: Internal Medicine

## 2018-03-10 NOTE — Telephone Encounter (Signed)
Prolia scheduled for 04/06/2018- Kathleen Thompson can you order for Pt? Thank you.

## 2018-03-10 NOTE — Telephone Encounter (Signed)
Prolia benefits received PA not required $50 copay for Prolia   Patient may owe approximately $40 OOP  Patient due after 04/04/18  Letter mailed to inform patient of benefits and to schedule

## 2018-03-12 NOTE — Telephone Encounter (Signed)
Medication has been ordered.

## 2018-03-15 NOTE — Telephone Encounter (Signed)
Medication in fridge for pt.

## 2018-03-24 ENCOUNTER — Ambulatory Visit (HOSPITAL_BASED_OUTPATIENT_CLINIC_OR_DEPARTMENT_OTHER)
Admission: RE | Admit: 2018-03-24 | Discharge: 2018-03-24 | Disposition: A | Discharge status: Home / Self Care | Payer: Medicare Other | Source: Ambulatory Visit | Attending: Medical | Admitting: Medical

## 2018-03-24 ENCOUNTER — Ambulatory Visit: Payer: Medicare Other | Admitting: Medical

## 2018-03-24 ENCOUNTER — Encounter: Payer: Self-pay | Admitting: Medical

## 2018-03-24 VITALS — BP 137/67 | HR 77 | Temp 98.0°F | Resp 16 | Ht 62.0 in | Wt 149.0 lb

## 2018-03-24 DIAGNOSIS — R600 Localized edema: Secondary | ICD-10-CM

## 2018-03-24 NOTE — Progress Notes (Signed)
Subjective:    Patient ID: Kathleen Thompson, female    DOB: 09/26/1933, 84 y.o.   MRN: [id number]  HPI  Pt in for some lower ext pedal edema for about one month and half. Pt states hx of swelling before past 6 weeks but less and she states at night swelling would go down and feet would look almost normal.  Pt has no leg pain, no popliteal pain, no sob and no obvious weight gain.  Pt bp is well controlled. Pt on losartan hctz.  Review of Systems  Constitutional: Negative for chills, fatigue and fever.  HENT: Negative for congestion.   Respiratory: Negative for cough, chest tightness, shortness of breath and wheezing.   Cardiovascular: Negative for chest pain and palpitations.  Gastrointestinal: Negative for abdominal pain.  Musculoskeletal: Negative for back pain and gait problem.       Pedal edema.  Skin: Negative for rash.  Neurological: Negative for dizziness, seizures, weakness and light-headedness.  Hematological: Negative for adenopathy. Does not bruise/bleed easily.  Psychiatric/Behavioral: Negative for behavioral problems and confusion. The patient is not nervous/anxious.     Past Medical History:  Diagnosis Date  . Adenomatous colon polyp 02/2003  . Arthritis   . Blood transfusion   . Cataract   . Diverticulosis   . Esophageal stricture   . Gastroparesis   . GERD (gastroesophageal reflux disease)   . Hyperlipidemia   . Hypertension   . Hypothyroidism   . Internal hemorrhoid   . Iron deficiency anemia   . Osteoporosis   . PONV (postoperative nausea and vomiting)   . PVD (peripheral vascular disease) ([place])   . Stroke [facility])    TIA  . TIA (transient ischemic attack)      Social History   Socioeconomic History  . Marital status: Married    Spouse name: Not on file  . Number of children: 2  . Years of education: Not on file  . Highest education level: Not on file  Occupational History  . Occupation: Psychologist, clinical: RETIRED  Social Needs  .  Financial resource strain: Not on file  . Food insecurity:    Worry: Not on file    Inability: Not on file  . Transportation needs:    Medical: Not on file    Non-medical: Not on file  Tobacco Use  . Smoking status: Never Smoker  . Smokeless tobacco: Never Used  Substance and Sexual Activity  . Alcohol use: No  . Drug use: No  . Sexual activity: Yes  Lifestyle  . Physical activity:    Days per week: Not on file    Minutes per session: Not on file  . Stress: Not on file  Relationships  . Social connections:    Talks on phone: Not on file    Gets together: Not on file    Attends religious service: Not on file    Active member of club or organization: Not on file    Attends meetings of clubs or organizations: Not on file    Relationship status: Not on file  . Intimate partner violence:    Fear of current or ex partner: Not on file    Emotionally abused: Not on file    Physically abused: Not on file    Forced sexual activity: Not on file  Other Topics Concern  . Not on file  Social History Narrative   Household-- pt and wife   2 boys , one in [place],  on in [place]    Only surviving sibling out of seven    Past Surgical History:  Procedure Laterality Date  . ABDOMINAL HYSTERECTOMY    . APPENDECTOMY    . CERVICAL FUSION    . EYE SURGERY Bilateral 08/2016   cataracts  . LEFT HEART CATHETERIZATION WITH CORONARY ANGIOGRAM N/A 12/20/2012   Procedure: LEFT HEART CATHETERIZATION WITH CORONARY ANGIOGRAM;  Surgeon: Peter M Martinique, MD;  Location: [facility] CATH LAB;  Service: Cardiovascular;  Laterality: N/A;  . shoulder surgery     . VIDEO ASSISTED THORACOSCOPY (VATS)/WEDGE RESECTION Right 03/14/2013   Procedure: VIDEO ASSISTED THORACOSCOPY (VATS)/WEDGE RESECTION;  Surgeon: Melrose Nakayama, MD;  Location: [place];  Service: Thoracic;  Laterality: Right;    Family History  Problem Relation Age of Onset  . Diabetes Mother   . Heart attack Mother   . Heart attack Father   . Asthma Father     . Colon cancer Brother        at age 71  . Colon cancer Sister        at age 82  . Diabetes Maternal Aunt   . Diabetes Maternal Uncle   . Heart attack Sister   . Heart attack Brother   . Heart attack Brother   . Heart attack Brother   . Heart attack Brother   . Heart attack Sister   . Breast cancer Neg Hx     Allergies  Allergen Reactions  . Clindamycin/Lincomycin Rash  . Cymbalta [Duloxetine Hcl]     Jittery, nervous, could not sleep  . Hydrocodone Hives  . Rofecoxib     hives  . Latex Rash    Pt states that latex causes blisters to skin    Current Outpatient Medications on File Prior to Visit  Medication Sig Dispense Refill  . aspirin 81 MG tablet Take 81 mg by mouth daily.    Marland Kitchen CALCIUM PO Take 1 tablet by mouth 2 (two) times daily.    . clopidogrel (PLAVIX) 75 MG tablet Take 1 tablet (75 mg total) by mouth daily. 90 tablet 1  . Coenzyme Q10 (CO Q-10 PO) Take 1 tablet by mouth daily.    . diclofenac sodium (VOLTAREN) 1 % GEL Apply 2 g topically 4 (four) times daily. 1 Tube 6  . isosorbide mononitrate (IMDUR) 30 MG 24 hr tablet Take 1 tablet (30 mg total) by mouth daily. 90 tablet 1  . losartan-hydrochlorothiazide (HYZAAR) 100-12.5 MG tablet Take 1/2 tablet by mouth twice daily 90 tablet 3  . magnesium 30 MG tablet Take 30 mg by mouth daily.      . pantoprazole (PROTONIX) 40 MG tablet TAKE 1 TABLET BY MOUTH ONCE DAILY BEFORE BREAKFAST 90 tablet 1  . rosuvastatin (CRESTOR) 20 MG tablet Take 1 tablet (20 mg total) by mouth daily. 90 tablet 1  . SYNTHROID 50 MCG tablet Take 1 tablet (50 mcg total) by mouth daily before breakfast. 90 tablet 1  . traMADol (ULTRAM) 50 MG tablet TAKE 1 TABLET BY MOUTH TWICE DAILY AS NEEDED FOR MODERATE PAIN 180 tablet 0  . vitamin D, CHOLECALCIFEROL, 400 UNITS tablet Take 400 Units by mouth daily.       No current facility-administered medications on file prior to visit.     BP 137/67   Pulse 77   Temp 98 F (36.7 C) (Oral)   Resp 16    Ht 5\' 2"  (1.575 m)   Wt 149 lb (67.6 kg)   SpO2 99%  BMI 27.25 kg/m       Objective:   Physical Exam   General Mental Status- Alert. General Appearance- Not in acute distress.   Skin General: Color- Normal Color. Moisture- Normal Moisture.  Neck Carotid Arteries- Normal color. Moisture- Normal Moisture. No carotid bruits. No JVD.  Chest and Lung Exam Auscultation: Breath Sounds:-Normal.  Cardiovascular Auscultation:Rythm- Regular. Murmurs & Other Heart Sounds:Auscultation of the heart reveals- No Murmurs.  Abdomen Inspection:-Inspeection Normal. Palpation/Percussion:Note:No mass. Palpation and Percussion of the abdomen reveal- Non Tender, Non Distended + BS, no rebound or guarding.  Neurologic Cranial Nerve exam:- CN III-XII intact(No nystagmus), symmetric smile. Strength:- 5/5 equal and symmetric strength both upper and lower extremities.  Lower ext- 1-2 plus pedal edema. Lower ext feet up to mid tibia area. negaive homans sign bilaterally. No warmth or tenderness.     Assessment & Plan:  You do have some moderate pedal edema presently. I do think this is dependant edema but since worse than your baseline will get cmp, bnp and cxr to make sure no early chf.  If studies are negative then will ask you to  use TED Hose compression stocking(you should be able to get pair at [facility])  Also want you to notify us if any severe assymetric swelling or any pain behind knee/popliteal area pain. In that event would need to be checked for DVT. That is not necessary today.  Follow up in 2 weeks or as needed  Will follow you closely. Other option to reduce leg swelling is increase diuretic dose but we may need to make modification on losartan dose in that event since your bp is so well controlled.  Mackie Pai, PA-C

## 2018-03-24 NOTE — Patient Instructions (Addendum)
You do have some moderate pedal edema presently. I do think this is dependant edema but since worse than your baseline will get cmp, bnp and cxr to make sure no early chf.  If studies are negative then will ask you to  use TED Hose compression stocking(you should be able to get pair at [facility])  Also want you to notify us if any severe assymetric swelling or any pain behind knee/popliteal area pain. In that event would need to be checked for DVT. That is not necessary today.  Follow up in 2 weeks or as needed  Will follow you closely. Other option to reduce leg swelling is increase diuretic dose but we may need to make modification on losartan dose in that event since your bp is so well controlled.

## 2018-03-25 LAB — COMPREHENSIVE METABOLIC PANEL
ALK PHOS: 58 U/L (ref 39–117)
ALT: 11 U/L (ref 0–35)
AST: 20 U/L (ref 0–37)
Albumin: 4.5 g/dL (ref 3.5–5.2)
BILIRUBIN TOTAL: 0.9 mg/dL (ref 0.2–1.2)
BUN: 10 mg/dL (ref 6–23)
CO2: 29 mEq/L (ref 19–32)
Calcium: 9.8 mg/dL (ref 8.4–10.5)
Chloride: 99 mEq/L (ref 96–112)
Creatinine, Ser: 0.92 mg/dL (ref 0.40–1.20)
GFR: 62.11 mL/min (ref >60.00)
GLUCOSE: 92 mg/dL (ref 70–99)
Potassium: 3.7 mEq/L (ref 3.5–5.1)
SODIUM: 136 meq/L (ref 135–145)
TOTAL PROTEIN: 7 g/dL (ref 6.0–8.3)

## 2018-03-25 LAB — BRAIN NATRIURETIC PEPTIDE: Pro B Natriuretic peptide (BNP): 95 pg/mL (ref 0.0–100.0)

## 2018-04-05 ENCOUNTER — Other Ambulatory Visit: Payer: Self-pay | Admitting: Internal Medicine

## 2018-04-06 ENCOUNTER — Ambulatory Visit (INDEPENDENT_AMBULATORY_CARE_PROVIDER_SITE_OTHER): Payer: Medicare Other

## 2018-04-06 DIAGNOSIS — M810 Age-related osteoporosis without current pathological fracture: Secondary | ICD-10-CM | POA: Diagnosis not present

## 2018-04-06 MED ORDER — DENOSUMAB 60 MG/ML ~~LOC~~ SOSY
60.0000 mg | PREFILLED_SYRINGE | Freq: Once | SUBCUTANEOUS | Status: AC
  Administered 2018-04-06: 60 mg via SUBCUTANEOUS

## 2018-04-07 ENCOUNTER — Telehealth (INDEPENDENT_AMBULATORY_CARE_PROVIDER_SITE_OTHER): Payer: Self-pay | Admitting: Orthopaedic Surgery

## 2018-04-07 NOTE — Telephone Encounter (Signed)
Anderson Malta at [facility] is needing patients lab results faxed over to them ASAP. She states patient is in their office and about to see the provider. Please fax to # [phone]  [facility] # [phone]

## 2018-04-07 NOTE — Telephone Encounter (Signed)
faxed

## 2018-04-19 ENCOUNTER — Other Ambulatory Visit: Payer: Self-pay | Admitting: Internal Medicine

## 2018-05-04 ENCOUNTER — Other Ambulatory Visit: Payer: Self-pay | Admitting: Internal Medicine

## 2018-06-06 ENCOUNTER — Telehealth: Payer: Self-pay | Admitting: Family Medicine

## 2018-06-06 ENCOUNTER — Other Ambulatory Visit: Payer: Self-pay | Admitting: Internal Medicine

## 2018-06-07 NOTE — Telephone Encounter (Signed)
Last tramadol RX:03/04/18, #180  Last OV: 03/24/18, acute visit Next OV: 06/09/18 UDS: 01/05/18, moderate risk CSC: 01/05/18 CSR: No discrepancies identified

## 2018-06-08 NOTE — Telephone Encounter (Signed)
Pt has appt tomorrow

## 2018-06-08 NOTE — Telephone Encounter (Signed)
Needs UDS , please call pt

## 2018-06-09 ENCOUNTER — Encounter: Payer: Self-pay | Admitting: Internal Medicine

## 2018-06-09 ENCOUNTER — Ambulatory Visit (INDEPENDENT_AMBULATORY_CARE_PROVIDER_SITE_OTHER): Payer: Medicare Other | Admitting: Internal Medicine

## 2018-06-09 VITALS — BP 126/74 | HR 80 | Temp 97.7°F | Resp 16 | Ht 62.0 in | Wt 146.2 lb

## 2018-06-09 DIAGNOSIS — Z0000 Encounter for general adult medical examination without abnormal findings: Secondary | ICD-10-CM | POA: Diagnosis not present

## 2018-06-09 DIAGNOSIS — Z79899 Other long term (current) drug therapy: Secondary | ICD-10-CM

## 2018-06-09 DIAGNOSIS — Z981 Arthrodesis status: Secondary | ICD-10-CM

## 2018-06-09 LAB — CBC WITH DIFFERENTIAL/PLATELET
BASOS ABS: 0 10*3/uL (ref 0.0–0.1)
Basophils Relative: 1 % (ref 0.0–3.0)
Eosinophils Absolute: 0.1 10*3/uL (ref 0.0–0.7)
Eosinophils Relative: 1.8 % (ref 0.0–5.0)
HCT: 43.8 % (ref 36.0–46.0)
Hemoglobin: 15 g/dL (ref 12.0–15.0)
Lymphocytes Relative: 44.1 % (ref 12.0–46.0)
Lymphs Abs: 1.8 10*3/uL (ref 0.7–4.0)
MCHC: 34.2 g/dL (ref 30.0–36.0)
MCV: 92.2 fl (ref 78.0–100.0)
MONOS PCT: 7.8 % (ref 3.0–12.0)
Monocytes Absolute: 0.3 10*3/uL (ref 0.1–1.0)
Neutro Abs: 1.9 10*3/uL (ref 1.4–7.7)
Neutrophils Relative %: 45.3 % (ref 43.0–77.0)
Platelets: 232 10*3/uL (ref 150.0–400.0)
RBC: 4.75 Mil/uL (ref 3.87–5.11)
RDW: 13.2 % (ref 11.5–15.5)
WBC: 4.2 10*3/uL (ref 4.0–10.5)

## 2018-06-09 LAB — TSH: TSH: 0.89 u[IU]/mL (ref 0.35–4.50)

## 2018-06-09 MED ORDER — TRAMADOL HCL 50 MG PO TABS: ORAL_TABLET | ORAL | 0 refills | Status: DC

## 2018-06-09 NOTE — Patient Instructions (Signed)
GO TO THE LAB : Get the blood work     GO TO THE FRONT DESK Schedule your next appointment for a   checkup in 6 months  Consider a Medicare wellness with one  of our nurses

## 2018-06-09 NOTE — Progress Notes (Signed)
Pre visit review using our clinic review tool, if applicable. No additional management support is needed unless otherwise documented below in the visit note. 

## 2018-06-09 NOTE — Assessment & Plan Note (Addendum)
--  Tdap-- 02/18/10 ; PNA 23:  02/18/10; prevnar 10/11/13 ; zostavax 02/26/11; s/p shingrix @ the pharmacy per pt;  s/p flu shot per pt  --No further paps, see previous entries  --Patient elected no further breast ca screening which is within the guidelines.  --CCS: 2 siblings w/ colon ca; 08/21/11 with Kathleen Edward, MD - diverticulosis, next 2018 (due),still not ready, will call when she is  --Diet and exercise, fall prevention discussed  -- labs: UDS CBC TSH

## 2018-06-09 NOTE — Progress Notes (Signed)
Subjective:    Patient ID: Kathleen Thompson, female    DOB: December 04, 1933, 84 y.o.   MRN: [id number]  DOS:  06/09/2018 Type of visit - description : cpx Interval history: Here for CPX  Review of Systems No new concerns, continue with significant neck pain.  Other than above, a 14 point review of systems is negative      Past Medical History:  Diagnosis Date  . Adenomatous colon polyp 02/2003  . Arthritis   . Blood transfusion   . Cataract   . Diverticulosis   . Esophageal stricture   . Gastroparesis   . GERD (gastroesophageal reflux disease)   . Hyperlipidemia   . Hypertension   . Hypothyroidism   . Internal hemorrhoid   . Iron deficiency anemia   . Osteoporosis   . PONV (postoperative nausea and vomiting)   . PVD (peripheral vascular disease) ([place])   . Stroke [facility])    TIA  . TIA (transient ischemic attack)     Past Surgical History:  Procedure Laterality Date  . ABDOMINAL HYSTERECTOMY    . APPENDECTOMY    . CERVICAL FUSION    . EYE SURGERY Bilateral 08/2016   cataracts  . LEFT HEART CATHETERIZATION WITH CORONARY ANGIOGRAM N/A 12/20/2012   Procedure: LEFT HEART CATHETERIZATION WITH CORONARY ANGIOGRAM;  Surgeon: Peter M Martinique, MD;  Location: [facility] CATH LAB;  Service: Cardiovascular;  Laterality: N/A;  . shoulder surgery     . VIDEO ASSISTED THORACOSCOPY (VATS)/WEDGE RESECTION Right 03/14/2013   Procedure: VIDEO ASSISTED THORACOSCOPY (VATS)/WEDGE RESECTION;  Surgeon: Melrose Nakayama, MD;  Location: [place];  Service: Thoracic;  Laterality: Right;    Social History   Socioeconomic History  . Marital status: Married    Spouse name: Not on file  . Number of children: 2  . Years of education: Not on file  . Highest education level: Not on file  Occupational History  . Occupation: Psychologist, clinical: RETIRED  Social Needs  . Financial resource strain: Not on file  . Food insecurity:    Worry: Not on file    Inability: Not on file  . Transportation  needs:    Medical: Not on file    Non-medical: Not on file  Tobacco Use  . Smoking status: Never Smoker  . Smokeless tobacco: Never Used  Substance and Sexual Activity  . Alcohol use: No  . Drug use: No  . Sexual activity: Yes  Lifestyle  . Physical activity:    Days per week: Not on file    Minutes per session: Not on file  . Stress: Not on file  Relationships  . Social connections:    Talks on phone: Not on file    Gets together: Not on file    Attends religious service: Not on file    Active member of club or organization: Not on file    Attends meetings of clubs or organizations: Not on file    Relationship status: Not on file  . Intimate partner violence:    Fear of current or ex partner: Not on file    Emotionally abused: Not on file    Physically abused: Not on file    Forced sexual activity: Not on file  Other Topics Concern  . Not on file  Social History Narrative   Household-- pt and wife   2 boys , one in [place], on in [place]    Only surviving sibling out of seven  Family History  Problem Relation Age of Onset  . Diabetes Mother   . Heart attack Mother   . Heart attack Father   . Asthma Father   . Colon cancer Brother        at age 78  . Colon cancer Sister        at age 43  . Diabetes Maternal Aunt   . Diabetes Maternal Uncle   . Heart attack Sister   . Heart attack Brother   . Heart attack Brother   . Heart attack Brother   . Heart attack Brother   . Heart attack Sister   . Breast cancer Neg Hx     Allergies as of 06/09/2018      Reactions   Clindamycin/lincomycin Rash   Cymbalta [duloxetine Hcl]    Jittery, nervous, could not sleep   Hydrocodone Hives   Rofecoxib    hives   Latex Rash   Pt states that latex causes blisters to skin      Medication List        Accurate as of 06/09/18 11:59 PM. Always use your most recent med list.          aspirin 81 MG tablet Take 81 mg by mouth daily.   CALCIUM PO Take 1 tablet by mouth 2  (two) times daily.   clopidogrel 75 MG tablet Commonly known as:  PLAVIX TAKE 1 TABLET BY MOUTH ONCE DAILY   CO Q-10 PO Take 1 tablet by mouth daily.   diclofenac sodium 1 % Gel Commonly known as:  VOLTAREN Apply 2 g topically 4 (four) times daily.   isosorbide mononitrate 30 MG 24 hr tablet Commonly known as:  IMDUR Take 1 tablet (30 mg total) by mouth daily.   losartan-hydrochlorothiazide 100-12.5 MG tablet Commonly known as:  HYZAAR Take 1/2 tablet by mouth twice daily   magnesium 30 MG tablet Take 30 mg by mouth daily.   pantoprazole 40 MG tablet Commonly known as:  PROTONIX Take 1 tablet (40 mg total) by mouth daily before breakfast.   rosuvastatin 20 MG tablet Commonly known as:  CRESTOR Take 1 tablet (20 mg total) by mouth daily.   SYNTHROID 50 MCG tablet Generic drug:  levothyroxine Take 1 tablet (50 mcg total) by mouth daily before breakfast.   traMADol 50 MG tablet Commonly known as:  ULTRAM TAKE 1 TABLET BY MOUTH TWICE DAILY AS NEEDED FOR MODERATE PAIN   vitamin D (CHOLECALCIFEROL) 400 units tablet Take 400 Units by mouth daily.          Objective:   Physical Exam BP 126/74 (BP Location: Left Arm, Patient Position: Sitting, Cuff Size: Small)   Pulse 80   Temp 97.7 F (36.5 C) (Oral)   Resp 16   Ht 5\' 2"  (1.575 m)   Wt 146 lb 4 oz (66.3 kg)   SpO2 98%   BMI 26.75 kg/m  General: Well developed, NAD, see BMI.  Neck: No  thyromegaly Neck: Slightly decreased range of motion noted HEENT:  Normocephalic . Face symmetric, atraumatic Lungs:  CTA B Normal respiratory effort, no intercostal retractions, no accessory muscle use. Heart: RRR,  no murmur.  No pretibial edema bilaterally  Abdomen:  Not distended, soft, non-tender. No rebound or rigidity.   Skin: Exposed areas without rash. Not pale. Not jaundice Neurologic:  alert & oriented X3.  Speech normal, gait appropriate for age and unassisted Strength symmetric and appropriate for age.    Psych: Cognition and judgment appear  intact.  Cooperative with normal attention span and concentration.  Behavior appropriate. No anxious or depressed appearing.     Assessment & Plan:   Assessment HTN Hypothyroidism Hyperlipidemia CV: --CAD cath 12/2012 single-vessel CAD,rx CV RF control, on Plavix, imdur --TIA remotely , apparently amaurosis fugax, was a started on on plavix per PCP, no further sx since. --On ASA and Plavix: Discuss 11/17/2016 Osteoporosis -  DEXAs @ [facility], T score -3.9 (12/2015).Prolia #1:  07/2016 MSK: Dr Lorin Mercy --DJD- s/p neck and shoulder surgeries ; + DJD cervical spine --knee pain: on tramadol rx per pcp GI: GED, gastroparesis, diverticulosis, esophageal stricture H/o Iron deficiency anemia H/o granuloma RUL, resected 03-14-13  PLAN: HTN: Seems well controlled on Hyzaar.  Last BMP satisfactory Hypothyroidism: On Synthroid, check a TSH Hyperlipidemia: On Crestor, last FLP satisfactory DJD, MSK: Pain continues to be her main problem (neck>>low back> knees) , tramadol works, make the pain less intense without apparent side effects. Orthopedic surgery checked  labs, ANA came back positive, saw rheumatology, was prescribed medication (gabapentin?)  but she could not tolerate it. Last UDS mod risk, diazepam found, she does not recall taking diazepam.  Recheck a UDS, refill tramadol. Osteoporosis : On Prolia , we agreed not to check a DEXA just yet, consider one by 2020 RTC 6 months

## 2018-06-10 NOTE — Assessment & Plan Note (Signed)
HTN: Seems well controlled on Hyzaar.  Last BMP satisfactory Hypothyroidism: On Synthroid, check a TSH Hyperlipidemia: On Crestor, last FLP satisfactory DJD, MSK: Pain continues to be her main problem (neck>>low back> knees) , tramadol works, make the pain less intense without apparent side effects. Orthopedic surgery checked  labs, ANA came back positive, saw rheumatology, was prescribed medication (gabapentin?)  but she could not tolerate it. Last UDS mod risk, diazepam found, she does not recall taking diazepam.  Recheck a UDS, refill tramadol. Osteoporosis : On Prolia , we agreed not to check a DEXA just yet, consider one by 2020 RTC 6 months

## 2018-06-11 LAB — PAIN MGMT, PROFILE 8 W/CONF, U
6 ACETYLMORPHINE: NEGATIVE ng/mL (ref <10)
ALCOHOL METABOLITES: NEGATIVE ng/mL (ref <500)
ALPHAHYDROXYTRIAZOLAM: NEGATIVE ng/mL (ref <50)
AMINOCLONAZEPAM: NEGATIVE ng/mL (ref <25)
AMPHETAMINES: NEGATIVE ng/mL (ref <500)
Alphahydroxyalprazolam: NEGATIVE ng/mL (ref <25)
Alphahydroxymidazolam: NEGATIVE ng/mL (ref <50)
BENZODIAZEPINES: NEGATIVE ng/mL (ref <100)
Buprenorphine, Urine: NEGATIVE ng/mL (ref <5)
CREATININE: 180.3 mg/dL
Cocaine Metabolite: NEGATIVE ng/mL (ref <150)
Hydroxyethylflurazepam: NEGATIVE ng/mL (ref <50)
Lorazepam: NEGATIVE ng/mL (ref <50)
MDMA: NEGATIVE ng/mL (ref <500)
Marijuana Metabolite: NEGATIVE ng/mL (ref <20)
NORDIAZEPAM: NEGATIVE ng/mL (ref <50)
OPIATES: NEGATIVE ng/mL (ref <100)
OXIDANT: NEGATIVE ug/mL (ref <200)
Oxazepam: NEGATIVE ng/mL (ref <50)
Oxycodone: NEGATIVE ng/mL (ref <100)
PH: 6.94 (ref 4.5–9.0)
Temazepam: NEGATIVE ng/mL (ref <50)

## 2018-06-11 LAB — PAIN MGMT, TRAMADOL W/MEDMATCH, U
DESMETHYLTRAMADOL: 9639 ng/mL — AB (ref <100)
Tramadol: >10000 ng/mL — ABNORMAL HIGH (ref <100)

## 2018-06-12 ENCOUNTER — Encounter (HOSPITAL_BASED_OUTPATIENT_CLINIC_OR_DEPARTMENT_OTHER): Payer: Self-pay | Admitting: *Deleted

## 2018-06-12 ENCOUNTER — Emergency Department (HOSPITAL_BASED_OUTPATIENT_CLINIC_OR_DEPARTMENT_OTHER)
Admission: EM | Admit: 2018-06-12 | Discharge: 2018-06-13 | Disposition: A | Discharge status: Home / Self Care | Payer: Medicare Other | Attending: Emergency Medicine | Admitting: Emergency Medicine

## 2018-06-12 ENCOUNTER — Other Ambulatory Visit: Payer: Self-pay

## 2018-06-12 ENCOUNTER — Emergency Department (HOSPITAL_BASED_OUTPATIENT_CLINIC_OR_DEPARTMENT_OTHER): Payer: Medicare Other

## 2018-06-12 DIAGNOSIS — I10 Essential (primary) hypertension: Secondary | ICD-10-CM | POA: Insufficient documentation

## 2018-06-12 DIAGNOSIS — Z8673 Personal history of transient ischemic attack (TIA), and cerebral infarction without residual deficits: Secondary | ICD-10-CM | POA: Diagnosis not present

## 2018-06-12 DIAGNOSIS — E785 Hyperlipidemia, unspecified: Secondary | ICD-10-CM | POA: Insufficient documentation

## 2018-06-12 DIAGNOSIS — R6883 Chills (without fever): Secondary | ICD-10-CM | POA: Diagnosis present

## 2018-06-12 DIAGNOSIS — Z79899 Other long term (current) drug therapy: Secondary | ICD-10-CM | POA: Diagnosis not present

## 2018-06-12 DIAGNOSIS — R451 Restlessness and agitation: Secondary | ICD-10-CM | POA: Diagnosis not present

## 2018-06-12 DIAGNOSIS — B349 Viral infection, unspecified: Secondary | ICD-10-CM | POA: Diagnosis not present

## 2018-06-12 DIAGNOSIS — E039 Hypothyroidism, unspecified: Secondary | ICD-10-CM | POA: Insufficient documentation

## 2018-06-12 LAB — URINALYSIS, ROUTINE W REFLEX MICROSCOPIC
BILIRUBIN URINE: NEGATIVE
Glucose, UA: NEGATIVE mg/dL
KETONES UR: NEGATIVE mg/dL
NITRITE: NEGATIVE
PH: 7 (ref 5.0–8.0)
PROTEIN: NEGATIVE mg/dL
Specific Gravity, Urine: 1.02 (ref 1.005–1.030)

## 2018-06-12 LAB — URINALYSIS, MICROSCOPIC (REFLEX)

## 2018-06-12 MED ORDER — LORAZEPAM 1 MG PO TABS
1.0000 mg | ORAL_TABLET | Freq: Once | ORAL | Status: AC
  Administered 2018-06-12: 1 mg via ORAL
  Filled 2018-06-12: qty 1

## 2018-06-12 NOTE — ED Notes (Addendum)
Pt reports having chills, diarrhea, non-productive cough, and body aches since Thursday. Pt denies a fever and reports a recent physical by her PCP the day prior to her symptoms starting.

## 2018-06-12 NOTE — ED Provider Notes (Signed)
TIME SEEN: 11:13 PM  CHIEF COMPLAINT: Body aches, chills, cough, diarrhea, restlessness  HPI: Patient is an 84 year old female who presents to the emergency department with 3 days of chills, body aches, dry cough, diarrhea and restless feeling.  States she has not had any documented fevers.  With no vomiting or abdominal pain.  No chest pain or shortness of breath.  Has had some intermittent headache with no neck pain or neck stiffness.  No sore throat or ear pain.  States she has felt restless and cannot sleep.  She does have a history of restless leg syndrome.  Not on medication for this.  She has had a flu shot this year.  No sick contacts.  No recent travel.  States she has had multiple episodes of diarrhea and now her bottom is raw and she is seeing some blood when she wipes.  ROS: See HPI Constitutional: no fever  Eyes: no drainage  ENT: no runny nose   Cardiovascular:  no chest pain  Resp: no SOB  GI: no vomiting GU: no dysuria Integumentary: no rash  Allergy: no hives  Musculoskeletal: no leg swelling  Neurological: no slurred speech ROS otherwise negative  PAST MEDICAL HISTORY/PAST SURGICAL HISTORY:  Past Medical History:  Diagnosis Date  . Adenomatous colon polyp 02/2003  . Arthritis   . Blood transfusion   . Cataract   . Diverticulosis   . Esophageal stricture   . Gastroparesis   . GERD (gastroesophageal reflux disease)   . Hyperlipidemia   . Hypertension   . Hypothyroidism   . Internal hemorrhoid   . Iron deficiency anemia   . Osteoporosis   . PONV (postoperative nausea and vomiting)   . PVD (peripheral vascular disease) ([place])   . Stroke [facility])    TIA  . TIA (transient ischemic attack)     MEDICATIONS:  Prior to Admission medications   Medication Sig Start Date End Date Taking? Authorizing Provider  aspirin 81 MG tablet Take 81 mg by mouth daily.    [provider]  CALCIUM PO Take 1 tablet by mouth 2 (two) times daily.    [provider]   clopidogrel (PLAVIX) 75 MG tablet TAKE 1 TABLET BY MOUTH ONCE DAILY 04/06/18   Colon Branch, MD  Coenzyme Q10 (CO Q-10 PO) Take 1 tablet by mouth daily.    [provider]  diclofenac sodium (VOLTAREN) 1 % GEL Apply 2 g topically 4 (four) times daily. 07/06/17   Colon Branch, MD  isosorbide mononitrate (IMDUR) 30 MG 24 hr tablet Take 1 tablet (30 mg total) by mouth daily. 06/07/18   Colon Branch, MD  losartan-hydrochlorothiazide [facility]) 100-12.5 MG tablet Take 1/2 tablet by mouth twice daily 08/18/17   Colon Branch, MD  magnesium 30 MG tablet Take 30 mg by mouth daily.      [provider]  pantoprazole (PROTONIX) 40 MG tablet Take 1 tablet (40 mg total) by mouth daily before breakfast. 05/04/18   Colon Branch, MD  rosuvastatin (CRESTOR) 20 MG tablet Take 1 tablet (20 mg total) by mouth daily. 04/19/18   Colon Branch, MD  SYNTHROID 50 MCG tablet Take 1 tablet (50 mcg total) by mouth daily before breakfast. 01/11/18   Colon Branch, MD  traMADol (ULTRAM) 50 MG tablet TAKE 1 TABLET BY MOUTH TWICE DAILY AS NEEDED FOR MODERATE PAIN 06/09/18   Colon Branch, MD  vitamin D, CHOLECALCIFEROL, 400 UNITS tablet Take 400 Units by mouth daily.  [provider]    ALLERGIES:  Allergies  Allergen Reactions  . Clindamycin/Lincomycin Rash  . Cymbalta [Duloxetine Hcl]     Jittery, nervous, could not sleep  . Hydrocodone Hives  . Rofecoxib     hives  . Latex Rash    Pt states that latex causes blisters to skin    SOCIAL HISTORY:  Social History   Tobacco Use  . Smoking status: Never Smoker  . Smokeless tobacco: Never Used  Substance Use Topics  . Alcohol use: No    FAMILY HISTORY: Family History  Problem Relation Age of Onset  . Diabetes Mother   . Heart attack Mother   . Heart attack Father   . Asthma Father   . Colon cancer Brother        at age 25  . Colon cancer Sister        at age 54  . Diabetes Maternal Aunt   . Diabetes Maternal Uncle   . Heart attack Sister    . Heart attack Brother   . Heart attack Brother   . Heart attack Brother   . Heart attack Brother   . Heart attack Sister   . Breast cancer Neg Hx     EXAM: BP (!) 160/86 (BP Location: Left Arm)   Pulse 86   Temp 98.5 F (36.9 C) (Oral)   Resp (!) 22   Ht 5\' 2"  (1.575 m)   Wt 66.3 kg   SpO2 99%   BMI 26.73 kg/m  CONSTITUTIONAL: Alert and oriented and responds appropriately to questions. Well-appearing; well-nourished, elderly, afebrile HEAD: Normocephalic EYES: Conjunctivae clear, pupils appear equal, EOMI ENT: normal nose; moist mucous membranes; No pharyngeal erythema or petechiae, no tonsillar hypertrophy or exudate, no uvular deviation, no unilateral swelling, no trismus or drooling, no muffled voice, normal phonation, no stridor, no dental caries present, no drainable dental abscess noted, no Ludwig's angina, tongue sits flat in the bottom of the mouth, no angioedema, no facial erythema or warmth, no facial swelling; no pain with movement of the neck. NECK: Supple, no meningismus, no nuchal rigidity, no LAD  CARD: RRR; S1 and S2 appreciated; no murmurs, no clicks, no rubs, no gallops RESP: Normal chest excursion without splinting or tachypnea; breath sounds clear and equal bilaterally; no wheezes, no rhonchi, no rales, no hypoxia or respiratory distress, speaking full sentences ABD/GI: Normal bowel sounds; non-distended; soft, non-tender, no rebound, no guarding, no peritoneal signs, no hepatosplenomegaly BACK:  The back appears normal and is non-tender to palpation, there is no CVA tenderness EXT: Normal ROM in all joints; non-tender to palpation; no edema; normal capillary refill; no cyanosis, no calf tenderness or swelling    SKIN: Normal color for age and race; warm; no rash NEURO: Moves all extremities equally, normal gait, normal speech, no facial asymmetry PSYCH: The patient's mood and manner are appropriate. Grooming and personal hygiene are appropriate.  MEDICAL  DECISION MAKING: Patient here with flulike symptoms.  She is well-appearing here, nontoxic, well-hydrated.  Will obtain labs, urine, chest x-ray.  We will also check electrolytes given patient complaining of restless feeling in her legs.  Will give Ativan for symptomatic relief.  Her husband is here and can drive her home.  It seems the insomnia and restlessness seem to be her biggest complaint.  ED PROGRESS: Patient's labs unremarkable other than potassium of 3.0.  I have ordered oral replacement given she does have some leg cramps and restless feeling.  Magnesium is normal.  No  leukocytosis.  Chest x-ray clear.  Urine shows no sign of infection.  Will discharge with brief prescription of Ativan given she feels it helped her symptoms in the ED.  She will follow closely with her primary care physician.  Doubt bacteremia, meningitis, pneumonia, sepsis based on benign work-up and benign exam today.  Both patient and husband comfortable plan for discharge home with outpatient follow-up.  At this time, I do not feel there is any life-threatening condition present. I have reviewed and discussed all results (EKG, imaging, lab, urine as appropriate) and exam findings with patient/family. I have reviewed nursing notes and appropriate previous records.  I feel the patient is safe to be discharged home without further emergent workup and can continue workup as an outpatient as needed. Discussed usual and customary return precautions. Patient/family verbalize understanding and are comfortable with this plan.  Outpatient follow-up has been provided if needed. All questions have been answered.    Ward, Delice Bison, DO 06/13/18 [phone]

## 2018-06-12 NOTE — ED Triage Notes (Signed)
Pt reports body aches and chills since Thursday night. Subjective fever. Dry cough. Headache. Diarrhea x 4 today, states "I'm seeing blood".

## 2018-06-13 LAB — COMPREHENSIVE METABOLIC PANEL
ALBUMIN: 4.3 g/dL (ref 3.5–5.0)
ALT: 15 U/L (ref 0–44)
AST: 22 U/L (ref 15–41)
Alkaline Phosphatase: 53 U/L (ref 38–126)
Anion gap: 11 (ref 5–15)
BUN: 9 mg/dL (ref 8–23)
CALCIUM: 9.6 mg/dL (ref 8.9–10.3)
CO2: 24 mmol/L (ref 22–32)
Chloride: 100 mmol/L (ref 98–111)
Creatinine, Ser: 0.76 mg/dL (ref 0.44–1.00)
GFR calc Af Amer: >60 mL/min (ref >60)
Glucose, Bld: 122 mg/dL — ABNORMAL HIGH (ref 70–99)
POTASSIUM: 3 mmol/L — AB (ref 3.5–5.1)
SODIUM: 135 mmol/L (ref 135–145)
TOTAL PROTEIN: 7.4 g/dL (ref 6.5–8.1)
Total Bilirubin: 0.9 mg/dL (ref 0.3–1.2)

## 2018-06-13 LAB — CBC WITH DIFFERENTIAL/PLATELET
Abs Immature Granulocytes: 0.01 10*3/uL (ref 0.00–0.07)
BASOS ABS: 0 10*3/uL (ref 0.0–0.1)
BASOS PCT: 0 %
EOS ABS: 0.1 10*3/uL (ref 0.0–0.5)
Eosinophils Relative: 1 %
HCT: 40.5 % (ref 36.0–46.0)
Hemoglobin: 13.6 g/dL (ref 12.0–15.0)
Immature Granulocytes: 0 %
Lymphocytes Relative: 39 %
Lymphs Abs: 2.7 10*3/uL (ref 0.7–4.0)
MCH: 30.3 pg (ref 26.0–34.0)
MCHC: 33.6 g/dL (ref 30.0–36.0)
MCV: 90.2 fL (ref 80.0–100.0)
Monocytes Absolute: 0.5 10*3/uL (ref 0.1–1.0)
Monocytes Relative: 8 %
NEUTROS PCT: 52 %
NRBC: 0 % (ref 0.0–0.2)
Neutro Abs: 3.5 10*3/uL (ref 1.7–7.7)
PLATELETS: 206 10*3/uL (ref 150–400)
RBC: 4.49 MIL/uL (ref 3.87–5.11)
RDW: 12.6 % (ref 11.5–15.5)
WBC: 6.8 10*3/uL (ref 4.0–10.5)

## 2018-06-13 LAB — MAGNESIUM: Magnesium: 2.1 mg/dL (ref 1.7–2.4)

## 2018-06-13 MED ORDER — POTASSIUM CHLORIDE CRYS ER 20 MEQ PO TBCR
40.0000 meq | EXTENDED_RELEASE_TABLET | Freq: Once | ORAL | Status: AC
  Administered 2018-06-13: 40 meq via ORAL
  Filled 2018-06-13: qty 2

## 2018-06-13 MED ORDER — LORAZEPAM 1 MG PO TABS: 1.0000 mg | ORAL_TABLET | Freq: Every day | ORAL | 0 refills | Status: DC

## 2018-06-13 NOTE — ED Notes (Signed)
Patient verbalizes understanding of discharge instructions. Opportunity for questioning and answers were provided. Armband removed by staff, pt discharged from ED home via POV.  

## 2018-06-13 NOTE — Discharge Instructions (Signed)
You may take Tylenol 1000 mg every 6 hours as needed for fever and pain.  Please follow-up with your primary care physician if you continue to have symptoms of restlessness.  Your labs today were normal other than a slightly low potassium level.  Magnesium level was normal.  Your chest x-ray and urine results were also normal.  I suspect you have a viral illness causing your symptoms.  We are discharging you with a prescription of Ativan to help with your insomnia, restlessness.  Please do not take this medication and drive.

## 2018-07-19 ENCOUNTER — Other Ambulatory Visit: Payer: Self-pay | Admitting: Internal Medicine

## 2018-08-16 ENCOUNTER — Other Ambulatory Visit: Payer: Self-pay | Admitting: Internal Medicine

## 2018-08-18 ENCOUNTER — Telehealth: Payer: Self-pay

## 2018-08-18 NOTE — Telephone Encounter (Signed)
Prolia--insurance verification requested.

## 2018-08-24 NOTE — Telephone Encounter (Signed)
Prolia in fridge

## 2018-08-24 NOTE — Telephone Encounter (Signed)
Prolia benefits received PA not required $50 copay for Prolia    Patient may owe approximately $50 OOP  Patient due after 10/06/2018.  Letter mailed to inform patient of benefits and to schedule

## 2018-09-01 ENCOUNTER — Other Ambulatory Visit: Payer: Self-pay

## 2018-09-01 MED ORDER — CICLOPIROX 8 % EX SOLN: CUTANEOUS | 0 refills | Status: DC

## 2018-09-01 NOTE — Telephone Encounter (Signed)
Nurse visit scheduled 10/07/2018 for Prolia.

## 2018-09-07 ENCOUNTER — Telehealth: Payer: Self-pay | Admitting: Internal Medicine

## 2018-09-07 NOTE — Telephone Encounter (Signed)
Sent!

## 2018-09-07 NOTE — Telephone Encounter (Signed)
Pt is requesting refill on tramadol.   Last OV: 06/09/2018 Last Fill: 06/09/2018 #180 and 0RF UDS: 06/09/2018 Low risk   NCCR in media 06/09/2018

## 2018-09-14 ENCOUNTER — Other Ambulatory Visit: Payer: Self-pay | Admitting: Internal Medicine

## 2018-10-07 ENCOUNTER — Ambulatory Visit (INDEPENDENT_AMBULATORY_CARE_PROVIDER_SITE_OTHER): Payer: Medicare Other | Admitting: *Deleted

## 2018-10-07 DIAGNOSIS — M810 Age-related osteoporosis without current pathological fracture: Secondary | ICD-10-CM

## 2018-10-07 MED ORDER — DENOSUMAB 60 MG/ML ~~LOC~~ SOSY
60.0000 mg | PREFILLED_SYRINGE | Freq: Once | SUBCUTANEOUS | Status: AC
  Administered 2018-10-07: 60 mg via SUBCUTANEOUS

## 2018-10-07 NOTE — Progress Notes (Signed)
Patient here for prolia injection  Injection given and patient tolerated well. 

## 2018-10-15 ENCOUNTER — Other Ambulatory Visit: Payer: Self-pay | Admitting: Internal Medicine

## 2018-12-05 ENCOUNTER — Telehealth: Payer: Self-pay | Admitting: Internal Medicine

## 2018-12-06 NOTE — Telephone Encounter (Signed)
Sent!

## 2018-12-06 NOTE — Telephone Encounter (Signed)
Pt is requesting refill on tramadol.   Last OV: 06/09/2018, appt scheduled 12/14/2018 Last Fill: 09/07/2018 #180 and 0rf Pt sig: 1 tab bid prn UDS: 06/09/2018 low risk

## 2018-12-14 ENCOUNTER — Ambulatory Visit (INDEPENDENT_AMBULATORY_CARE_PROVIDER_SITE_OTHER): Payer: Medicare Other | Admitting: Internal Medicine

## 2018-12-14 ENCOUNTER — Encounter: Payer: Self-pay | Admitting: Internal Medicine

## 2018-12-14 ENCOUNTER — Other Ambulatory Visit: Payer: Self-pay

## 2018-12-14 VITALS — BP 106/63 | HR 74 | Temp 97.6°F

## 2018-12-14 DIAGNOSIS — E785 Hyperlipidemia, unspecified: Secondary | ICD-10-CM | POA: Diagnosis not present

## 2018-12-14 DIAGNOSIS — M150 Primary generalized (osteo)arthritis: Secondary | ICD-10-CM | POA: Diagnosis not present

## 2018-12-14 DIAGNOSIS — E039 Hypothyroidism, unspecified: Secondary | ICD-10-CM | POA: Diagnosis not present

## 2018-12-14 DIAGNOSIS — I10 Essential (primary) hypertension: Secondary | ICD-10-CM

## 2018-12-14 DIAGNOSIS — M159 Polyosteoarthritis, unspecified: Secondary | ICD-10-CM

## 2018-12-14 NOTE — Progress Notes (Signed)
Subjective:    Patient ID: Kathleen Thompson, female    DOB: 06-11-1934, 85 y.o.   MRN: [id number]  DOS:  12/14/2018 Type of visit - description: Attempted  to make this a video visit, due to technical difficulties from the patient side it was not possible  thus we proceeded with a Virtual Visit via Telephone    I connected with@ on 12/14/18 at  2:00 PM EDT by telephone and verified that I am speaking with the correct person using two identifiers.  THIS ENCOUNTER IS A VIRTUAL VISIT DUE TO COVID-19 - PATIENT WAS NOT SEEN IN THE OFFICE. PATIENT HAS CONSENTED TO VIRTUAL VISIT / TELEMEDICINE VISIT   Location of patient: home  Location of provider: office  I discussed the limitations, risks, security and privacy concerns of performing an evaluation and management service by telephone and the availability of in person appointments. I also discussed with the patient that there may be a patient responsible charge related to this service. The patient expressed understanding and agreed to proceed.   History of Present Illness: Routine office visit In general feeling well. Pain management: Still needs tramadol twice a day HTN: Good compliance with medication.  BP today is very good, 106/63.  She denies dizziness or weakness. Reviewed together her most recent labs and her medication list. COVID-19, doing very well with prevention, following CDC guidelines    Review of Systems Denies any recent fever or chills No difficulty breathing No nausea, vomiting, diarrhea No cough  Past Medical History:  Diagnosis Date  . Adenomatous colon polyp 02/2003  . Arthritis   . Blood transfusion   . Cataract   . Diverticulosis   . Esophageal stricture   . Gastroparesis   . GERD (gastroesophageal reflux disease)   . Hyperlipidemia   . Hypertension   . Hypothyroidism   . Internal hemorrhoid   . Iron deficiency anemia   . Osteoporosis   . PONV (postoperative nausea and vomiting)   . PVD (peripheral  vascular disease) ([place])   . Stroke [facility])    TIA  . TIA (transient ischemic attack)     Past Surgical History:  Procedure Laterality Date  . ABDOMINAL HYSTERECTOMY    . APPENDECTOMY    . CERVICAL FUSION    . EYE SURGERY Bilateral 08/2016   cataracts  . LEFT HEART CATHETERIZATION WITH CORONARY ANGIOGRAM N/A 12/20/2012   Procedure: LEFT HEART CATHETERIZATION WITH CORONARY ANGIOGRAM;  Surgeon: Peter M Martinique, MD;  Location[facility] CATH LAB;  Service: Cardiovascular;  Laterality: N/A;  . shoulder surgery     . VIDEO ASSISTED THORACOSCOPY (VATS)/WEDGE RESECTION Right 03/14/2013   Procedure: VIDEO ASSISTED THORACOSCOPY (VATS)/WEDGE RESECTION;  Surgeon: Melrose Nakayama, MD;  Location: [place];  Service: Thoracic;  Laterality: Right;    Social History   Socioeconomic History  . Marital status: Married    Spouse name: Not on file  . Number of children: 2  . Years of education: Not on file  . Highest education level: Not on file  Occupational History  . Occupation: Psychologist, clinical: RETIRED  Social Needs  . Financial resource strain: Not on file  . Food insecurity:    Worry: Not on file    Inability: Not on file  . Transportation needs:    Medical: Not on file    Non-medical: Not on file  Tobacco Use  . Smoking status: Never Smoker  . Smokeless tobacco: Never Used  Substance and Sexual Activity  .  Alcohol use: No  . Drug use: No  . Sexual activity: Yes  Lifestyle  . Physical activity:    Days per week: Not on file    Minutes per session: Not on file  . Stress: Not on file  Relationships  . Social connections:    Talks on phone: Not on file    Gets together: Not on file    Attends religious service: Not on file    Active member of club or organization: Not on file    Attends meetings of clubs or organizations: Not on file    Relationship status: Not on file  . Intimate partner violence:    Fear of current or ex partner: Not on file    Emotionally abused: Not on  file    Physically abused: Not on file    Forced sexual activity: Not on file  Other Topics Concern  . Not on file  Social History Narrative   Household-- pt and wife   2 boys , one in [place], on in [place]    Only surviving sibling out of seven      Allergies as of 12/14/2018      Reactions   Clindamycin/lincomycin Rash   Cymbalta [duloxetine Hcl]    Jittery, nervous, could not sleep   Hydrocodone Hives   Rofecoxib    hives   Latex Rash   Pt states that latex causes blisters to skin      Medication List       Accurate as of Dec 14, 2018  2:00 PM. Always use your most recent med list.        aspirin 81 MG tablet Take 81 mg by mouth daily.   CALCIUM PO Take 1 tablet by mouth 2 (two) times daily.   ciclopirox 8 % solution Commonly known as:  PENLAC Apply over nail and surrounding skin. Apply daily over previous coat. After seven (7) days, may remove with alcohol and continue cycle.   clopidogrel 75 MG tablet Commonly known as:  PLAVIX Take 1 tablet (75 mg total) by mouth daily.   CO Q-10 PO Take 1 tablet by mouth daily.   diclofenac sodium 1 % Gel Commonly known as:  VOLTAREN Apply 2 g topically 4 (four) times daily.   isosorbide mononitrate 30 MG 24 hr tablet Commonly known as:  IMDUR Take 1 tablet (30 mg total) by mouth daily.   LORazepam 1 MG tablet Commonly known as:  Ativan Take 1 tablet (1 mg total) by mouth at bedtime.   losartan-hydrochlorothiazide 100-12.5 MG tablet Commonly known as:  HYZAAR TAKE 1/2 (ONE-HALF) TABLET BY MOUTH TWICE DAILY   magnesium 30 MG tablet Take 30 mg by mouth daily.   pantoprazole 40 MG tablet Commonly known as:  PROTONIX Take 1 tablet (40 mg total) by mouth daily before breakfast.   rosuvastatin 20 MG tablet Commonly known as:  CRESTOR Take 1 tablet (20 mg total) by mouth daily.   Synthroid 50 MCG tablet Generic drug:  levothyroxine Take 1 tablet (50 mcg total) by mouth daily before breakfast.   traMADol 50 MG tablet  Commonly known as:  ULTRAM TAKE 1 TABLET BY MOUTH TWICE DAILY AS NEEDED FOR MODERATE PAIN   vitamin D (CHOLECALCIFEROL) 400 units tablet Take 400 Units by mouth daily.           Objective:   Physical Exam There were no vitals taken for this visit. This is a phone virtual visit, she seems to be doing very  well, alert oriented x3 BP 106/60 today, pulse 74, temperature 97.6    Assessment     Assessment HTN Hypothyroidism Hyperlipidemia CV: --CAD cath 12/2012 single-vessel CAD,rx CV RF control, on Plavix, imdur --TIA remotely , apparently amaurosis fugax, was a started on on plavix per PCP, no further sx since. --On ASA and Plavix: Discuss 11/17/2016 Osteoporosis -  DEXAs @ [facility], T score -3.9 (12/2015).Prolia #1:  07/2016 MSK: Dr Lorin Mercy --DJD- s/p neck and shoulder surgeries ; + DJD cervical spine --knee pain: on tramadol rx per pcp GI: GED, gastroparesis, diverticulosis, esophageal stricture H/o Iron deficiency anemia H/o granuloma RUL, resected 03-14-13  PLAN: HTN: Slightly in the low side today but the patient is completely asymptomatic.  Continue Imdur, Hyzaar. Hypothyroidism: Last TSH satisfactory, continue present care Hyperlipidemia: Due for labs, continue Crestor. CAD: Asymptomatic on DAPT due to history of CAD and TIA DJD, MSK: On tramadol twice a day, symptoms relatively well controlled. Under normal circumstances I will get a BMP, FLP but in the midst of the coronavirus pandemia I think is safer for her to stay at home. .  Will reach out in 6 weeks if the circumstances are better RTC: Otherwise follow-up 05-2019 for a CPX fasting.

## 2018-12-15 DIAGNOSIS — M1990 Unspecified osteoarthritis, unspecified site: Secondary | ICD-10-CM | POA: Insufficient documentation

## 2018-12-15 NOTE — Assessment & Plan Note (Signed)
HTN: Slightly in the low side today but the patient is completely asymptomatic.  Continue Imdur, Hyzaar. Hypothyroidism: Last TSH satisfactory, continue present care Hyperlipidemia: Due for labs, continue Crestor. CAD: Asymptomatic on DAPT due to history of CAD and TIA DJD, MSK: On tramadol twice a day, symptoms relatively well controlled. Under normal circumstances I will get a BMP, FLP but in the midst of the coronavirus pandemia I think is safer for her to stay at home. .  Will reach out in 6 weeks if the circumstances are better RTC: Otherwise follow-up 05-2019 for a CPX fasting.

## 2019-01-29 ENCOUNTER — Other Ambulatory Visit: Payer: Self-pay | Admitting: Internal Medicine

## 2019-03-07 ENCOUNTER — Telehealth: Payer: Self-pay | Admitting: Internal Medicine

## 2019-03-07 NOTE — Telephone Encounter (Signed)
Sent!

## 2019-03-07 NOTE — Telephone Encounter (Signed)
Tramadol refill.   Last OV: 12/14/2018 Last Fill: 12/06/2018 #180 and 0RF UDS: 06/09/2018 Low risk

## 2019-04-08 ENCOUNTER — Ambulatory Visit: Payer: Medicare Other

## 2019-04-08 ENCOUNTER — Other Ambulatory Visit: Payer: Self-pay

## 2019-04-08 DIAGNOSIS — M810 Age-related osteoporosis without current pathological fracture: Secondary | ICD-10-CM | POA: Diagnosis not present

## 2019-04-08 MED ORDER — DENOSUMAB 60 MG/ML ~~LOC~~ SOSY
60.0000 mg | PREFILLED_SYRINGE | Freq: Once | SUBCUTANEOUS | Status: AC
  Administered 2019-04-08: 15:00:00 60 mg via SUBCUTANEOUS

## 2019-04-08 NOTE — Progress Notes (Addendum)
Patient came in today to have her Prolia injection per Dr. Larose Kells. Patient tolerated 60mg /mL SQ in her left arm. She has no complications.  Kathlene November, MD

## 2019-04-12 ENCOUNTER — Ambulatory Visit (INDEPENDENT_AMBULATORY_CARE_PROVIDER_SITE_OTHER): Payer: Medicare Other

## 2019-04-12 ENCOUNTER — Other Ambulatory Visit: Payer: Self-pay

## 2019-04-12 DIAGNOSIS — Z23 Encounter for immunization: Secondary | ICD-10-CM | POA: Diagnosis not present

## 2019-04-12 NOTE — Progress Notes (Signed)
Pre visit review using our clinic review tool, if applicable. No additional management support is needed unless otherwise documented below in the visit note.  Patient here today for flu vaccine. 0.37mL flu vaccine given in patients left deltoid IM. Patient tolerated well.

## 2019-05-18 ENCOUNTER — Other Ambulatory Visit: Payer: Self-pay

## 2019-05-19 ENCOUNTER — Encounter: Payer: Self-pay | Admitting: Internal Medicine

## 2019-05-19 ENCOUNTER — Ambulatory Visit (INDEPENDENT_AMBULATORY_CARE_PROVIDER_SITE_OTHER): Payer: Medicare Other | Admitting: Internal Medicine

## 2019-05-19 VITALS — BP 128/61 | HR 79 | Temp 96.0°F | Resp 16 | Ht 62.0 in | Wt 153.0 lb

## 2019-05-19 DIAGNOSIS — M810 Age-related osteoporosis without current pathological fracture: Secondary | ICD-10-CM | POA: Diagnosis not present

## 2019-05-19 DIAGNOSIS — E039 Hypothyroidism, unspecified: Secondary | ICD-10-CM

## 2019-05-19 DIAGNOSIS — R739 Hyperglycemia, unspecified: Secondary | ICD-10-CM

## 2019-05-19 DIAGNOSIS — Z0000 Encounter for general adult medical examination without abnormal findings: Secondary | ICD-10-CM

## 2019-05-19 DIAGNOSIS — Z79899 Other long term (current) drug therapy: Secondary | ICD-10-CM

## 2019-05-19 DIAGNOSIS — E7849 Other hyperlipidemia: Secondary | ICD-10-CM

## 2019-05-19 DIAGNOSIS — M8949 Other hypertrophic osteoarthropathy, multiple sites: Secondary | ICD-10-CM

## 2019-05-19 DIAGNOSIS — M159 Polyosteoarthritis, unspecified: Secondary | ICD-10-CM

## 2019-05-19 LAB — COMPREHENSIVE METABOLIC PANEL
ALT: 13 U/L (ref 0–35)
AST: 21 U/L (ref 0–37)
Albumin: 4.5 g/dL (ref 3.5–5.2)
Alkaline Phosphatase: 57 U/L (ref 39–117)
BUN: 9 mg/dL (ref 6–23)
CO2: 25 mEq/L (ref 19–32)
Calcium: 9.7 mg/dL (ref 8.4–10.5)
Chloride: 101 mEq/L (ref 96–112)
Creatinine, Ser: 0.84 mg/dL (ref 0.40–1.20)
GFR: 64.72 mL/min (ref >60.00)
Glucose, Bld: 101 mg/dL — ABNORMAL HIGH (ref 70–99)
Potassium: 3.8 mEq/L (ref 3.5–5.1)
Sodium: 138 mEq/L (ref 135–145)
Total Bilirubin: 1.1 mg/dL (ref 0.2–1.2)
Total Protein: 6.9 g/dL (ref 6.0–8.3)

## 2019-05-19 LAB — CBC WITH DIFFERENTIAL/PLATELET
Basophils Absolute: 0 10*3/uL (ref 0.0–0.1)
Basophils Relative: 0.6 % (ref 0.0–3.0)
Eosinophils Absolute: 0.1 10*3/uL (ref 0.0–0.7)
Eosinophils Relative: 1.8 % (ref 0.0–5.0)
HCT: 42.2 % (ref 36.0–46.0)
Hemoglobin: 14.1 g/dL (ref 12.0–15.0)
Lymphocytes Relative: 41.9 % (ref 12.0–46.0)
Lymphs Abs: 2.2 10*3/uL (ref 0.7–4.0)
MCHC: 33.6 g/dL (ref 30.0–36.0)
MCV: 93.7 fl (ref 78.0–100.0)
Monocytes Absolute: 0.3 10*3/uL (ref 0.1–1.0)
Monocytes Relative: 6.6 % (ref 3.0–12.0)
Neutro Abs: 2.6 10*3/uL (ref 1.4–7.7)
Neutrophils Relative %: 49.1 % (ref 43.0–77.0)
Platelets: 245 10*3/uL (ref 150.0–400.0)
RBC: 4.5 Mil/uL (ref 3.87–5.11)
RDW: 13.3 % (ref 11.5–15.5)
WBC: 5.2 10*3/uL (ref 4.0–10.5)

## 2019-05-19 LAB — LIPID PANEL
Cholesterol: 177 mg/dL (ref 0–200)
HDL: 67.3 mg/dL (ref >39.00)
LDL Cholesterol: 83 mg/dL (ref 0–99)
NonHDL: 109.26
Total CHOL/HDL Ratio: 3
Triglycerides: 133 mg/dL (ref 0.0–149.0)
VLDL: 26.6 mg/dL (ref 0.0–40.0)

## 2019-05-19 LAB — TSH: TSH: 1.47 u[IU]/mL (ref 0.35–4.50)

## 2019-05-19 LAB — HEMOGLOBIN A1C: Hgb A1c MFr Bld: 6.6 % — ABNORMAL HIGH (ref 4.6–6.5)

## 2019-05-19 NOTE — Progress Notes (Signed)
Subjective:    Patient ID: Kathleen Thompson, female    DOB: 09-Feb-1934, 85 y.o.   MRN: [id number]  DOS:  05/19/2019 Type of visit - description: CPX Since the last office visit things are stable except for left hip pain. Pain is really not well controlled with Ultram twice a day. Had lower extremity edema, that has improved.   Review of Systems Other than above, a 14 point review of systems is negative  Past Medical History:  Diagnosis Date  . Adenomatous colon polyp 02/2003  . Arthritis   . Blood transfusion   . Cataract   . Diverticulosis   . Esophageal stricture   . Gastroparesis   . GERD (gastroesophageal reflux disease)   . Hyperlipidemia   . Hypertension   . Hypothyroidism   . Internal hemorrhoid   . Iron deficiency anemia   . Osteoporosis   . PONV (postoperative nausea and vomiting)   . PVD (peripheral vascular disease) ([place])   . Stroke [facility])    TIA  . TIA (transient ischemic attack)     Past Surgical History:  Procedure Laterality Date  . ABDOMINAL HYSTERECTOMY    . APPENDECTOMY    . CERVICAL FUSION    . EYE SURGERY Bilateral 08/2016   cataracts  . LEFT HEART CATHETERIZATION WITH CORONARY ANGIOGRAM N/A 12/20/2012   Procedure: LEFT HEART CATHETERIZATION WITH CORONARY ANGIOGRAM;  Surgeon: Peter M Martinique, MD;  Location: [facility] CATH LAB;  Service: Cardiovascular;  Laterality: N/A;  . shoulder surgery     . VIDEO ASSISTED THORACOSCOPY (VATS)/WEDGE RESECTION Right 03/14/2013   Procedure: VIDEO ASSISTED THORACOSCOPY (VATS)/WEDGE RESECTION;  Surgeon: Melrose Nakayama, MD;  Location: [place];  Service: Thoracic;  Laterality: Right;    Social History   Socioeconomic History  . Marital status: Married    Spouse name: Not on file  . Number of children: 2  . Years of education: Not on file  . Highest education level: Not on file  Occupational History  . Occupation: Psychologist, clinical: RETIRED  Social Needs  . Financial resource strain: Not on file  . Food  insecurity    Worry: Not on file    Inability: Not on file  . Transportation needs    Medical: Not on file    Non-medical: Not on file  Tobacco Use  . Smoking status: Never Smoker  . Smokeless tobacco: Never Used  Substance and Sexual Activity  . Alcohol use: No  . Drug use: No  . Sexual activity: Yes  Lifestyle  . Physical activity    Days per week: Not on file    Minutes per session: Not on file  . Stress: Not on file  Relationships  . Social Herbalist on phone: Not on file    Gets together: Not on file    Attends religious service: Not on file    Active member of club or organization: Not on file    Attends meetings of clubs or organizations: Not on file    Relationship status: Not on file  . Intimate partner violence    Fear of current or ex partner: Not on file    Emotionally abused: Not on file    Physically abused: Not on file    Forced sexual activity: Not on file  Other Topics Concern  . Not on file  Social History Narrative   Household-- pt and wife   2 boys , one in [employer], on in  [place]    Only surviving sibling out of seven     Family History  Problem Relation Age of Onset  . Diabetes Mother   . Heart attack Mother   . Heart attack Father   . Asthma Father   . Colon cancer Brother        at age 12  . Colon cancer Sister        at age 57  . Diabetes Maternal Aunt   . Diabetes Maternal Uncle   . Heart attack Sister   . Heart attack Brother   . Heart attack Brother   . Heart attack Brother   . Heart attack Brother   . Heart attack Sister   . Breast cancer Neg Hx      Allergies as of 05/19/2019      Reactions   Clindamycin/lincomycin Rash   Cymbalta [duloxetine Hcl]    Jittery, nervous, could not sleep   Hydrocodone Hives   Rofecoxib    hives   Latex Rash   Pt states that latex causes blisters to skin      Medication List       Accurate as of May 19, 2019 11:59 PM. If you have any questions, ask your nurse or doctor.         STOP taking these medications   ciclopirox 8 % solution Commonly known as: PENLAC Stopped by: Kathlene November, MD   diclofenac sodium 1 % Gel Commonly known as: VOLTAREN Stopped by: Kathlene November, MD     TAKE these medications   aspirin 81 MG tablet Take 81 mg by mouth daily.   CALCIUM PO Take 1 tablet by mouth 2 (two) times daily.   clopidogrel 75 MG tablet Commonly known as: PLAVIX Take 1 tablet (75 mg total) by mouth daily.   CO Q-10 PO Take 1 tablet by mouth daily.   isosorbide mononitrate 30 MG 24 hr tablet Commonly known as: IMDUR Take 1 tablet (30 mg total) by mouth daily.   losartan-hydrochlorothiazide 100-12.5 MG tablet Commonly known as: HYZAAR TAKE 1/2 (ONE-HALF) TABLET BY MOUTH TWICE DAILY   magnesium 30 MG tablet Take 30 mg by mouth daily.   pantoprazole 40 MG tablet Commonly known as: PROTONIX Take 1 tablet (40 mg total) by mouth daily before breakfast.   rosuvastatin 20 MG tablet Commonly known as: CRESTOR Take 1 tablet (20 mg total) by mouth daily.   Synthroid 50 MCG tablet Generic drug: levothyroxine Take 1 tablet (50 mcg total) by mouth daily before breakfast.   traMADol 50 MG tablet Commonly known as: ULTRAM Take 1 tablet (50 mg total) by mouth 3 (three) times daily as needed. What changed: See the new instructions. Changed by: Kathlene November, MD   vitamin D (CHOLECALCIFEROL) 400 units tablet Take 400 Units by mouth daily.           Objective:   Physical Exam BP 128/61 (BP Location: Left Arm, Patient Position: Sitting, Cuff Size: Small)   Pulse 79   Temp (!) 96 F (35.6 C) (Temporal)   Resp 16   Ht 5\' 2"  (1.575 m)   Wt 153 lb (69.4 kg)   SpO2 98%   BMI 27.98 kg/m  General: Well developed, NAD, BMI noted Neck: No  thyromegaly.  Normal carotid pulses HEENT:  Normocephalic . Face symmetric, atraumatic Lungs:  CTA B Normal respiratory effort, no intercostal retractions, no accessory muscle use. Heart: RRR,  no murmur.  Trace pretibial  edema bilaterally  Abdomen:  Not  distended, soft, non-tender. No rebound or rigidity.   Skin: Exposed areas without rash. Not pale. Not jaundice Neurologic:  alert & oriented X3.  Speech normal Gait appropriate for age and unassisted but discomfort noted due to hip pain with transferring. Strength symmetric and appropriate for age.  Psych: Cognition and judgment appear intact.  Cooperative with normal attention span and concentration.  Behavior appropriate. No anxious or depressed appearing.     Assessment      Assessment HTN Hypothyroidism Hyperlipidemia CV: --CAD cath 12/2012 single-vessel CAD,rx CV RF control, on Plavix, imdur --TIA remotely , apparently amaurosis fugax, was a started on on plavix per PCP, no further sx since. --On ASA and Plavix: Discuss 11/17/2016 Osteoporosis -  DEXAs @ [facility], T score -3.9 (12/2015).Prolia #1:  07/2016 MSK: Dr Lorin Mercy --DJD- s/p neck and shoulder surgeries ; + DJD cervical spine --knee pain: on tramadol rx per pcp GI: GED, gastroparesis, diverticulosis, esophageal stricture H/o Iron deficiency anemia H/o granuloma RUL, resected 03-14-13  PLAN: HTN: Continue Hyzaar, Imdur.  Checking labs Hypothyroidism: Continue levothyroxine, checking labs Hyperlipidemia: On Crestor, checking labs CAD, remote TIA: Controlling CV RF.  No symptoms Osteoporosis: On Prolia, check DEXA DJD: Currently her main concern is hip pain, on Ultram twice a day, she seems to be having discomfort, she is reluctant to see Ortho due to the quarantine and risk of COVID-19.    I advised her to proceed, offices are taking great COVID-19 precautions. Ok to increase tramadol from BID to TID prn UDS today RTC 6 months  Today, in addition to her physical exam, I spent more than 20    min with the patient: >50% of the time counseling regards her chronic medical problems including HTN, hypothyroidism, osteoporosis, pain management.

## 2019-05-19 NOTE — Progress Notes (Signed)
Pre visit review using our clinic review tool, if applicable. No additional management support is needed unless otherwise documented below in the visit note. 

## 2019-05-19 NOTE — Patient Instructions (Addendum)
Please schedule Medicare Wellness with Glenard Haring.   GO TO THE LAB : Get the blood work     GO TO THE FRONT DESK Schedule your next appointment   for a checkup in 6 months     Fall Prevention in the Home, Adult Falls can cause injuries and can affect people from all age groups. There are many simple things that you can do to make your home safe and to help prevent falls. Ask for help when making these changes, if needed. What actions can I take to prevent falls? General instructions  Use good lighting in all rooms. Replace any light bulbs that burn out.  Turn on lights if it is dark. Use night-lights.  Place frequently used items in easy-to-reach places. Lower the shelves around your home if necessary.  Set up furniture so that there are clear paths around it. Avoid moving your furniture around.  Remove throw rugs and other tripping hazards from the floor.  Avoid walking on wet floors.  Fix any uneven floor surfaces.  Add color or contrast paint or tape to grab bars and handrails in your home. Place contrasting color strips on the first and last steps of stairways.  When you use a stepladder, make sure that it is completely opened and that the sides are firmly locked. Have someone hold the ladder while you are using it. Do not climb a closed stepladder.  Be aware of any and all pets. What can I do in the bathroom?      Keep the floor dry. Immediately clean up any water that spills onto the floor.  Remove soap buildup in the tub or shower on a regular basis.  Use non-skid mats or decals on the floor of the tub or shower.  Attach bath mats securely with double-sided, non-slip rug tape.  If you need to sit down while you are in the shower, use a plastic, non-slip stool.  Install grab bars by the toilet and in the tub and shower. Do not use towel bars as grab bars. What can I do in the bedroom?  Make sure that a bedside light is easy to reach.  Do not use oversized  bedding that drapes onto the floor.  Have a firm chair that has side arms to use for getting dressed. What can I do in the kitchen?  Clean up any spills right away.  If you need to reach for something above you, use a sturdy step stool that has a grab bar.  Keep electrical cables out of the way.  Do not use floor polish or wax that makes floors slippery. If you must use wax, make sure that it is non-skid floor wax. What can I do in the stairways?  Do not leave any items on the stairs.  Make sure that you have a light switch at the top of the stairs and the bottom of the stairs. Have them installed if you do not have them.  Make sure that there are handrails on both sides of the stairs. Fix handrails that are broken or loose. Make sure that handrails are as long as the stairways.  Install non-slip stair treads on all stairs in your home.  Avoid having throw rugs at the top or bottom of stairways, or secure the rugs with carpet tape to prevent them from moving.  Choose a carpet design that does not hide the edge of steps on the stairway.  Check any carpeting to make sure that it is  firmly attached to the stairs. Fix any carpet that is loose or worn. What can I do on the outside of my home?  Use bright outdoor lighting.  Regularly repair the edges of walkways and driveways and fix any cracks.  Remove high doorway thresholds.  Trim any shrubbery on the main path into your home.  Regularly check that handrails are securely fastened and in good repair. Both sides of any steps should have handrails.  Install guardrails along the edges of any raised decks or porches.  Clear walkways of debris and clutter, including tools and rocks.  Have leaves, snow, and ice cleared regularly.  Use sand or salt on walkways during winter months.  In the garage, clean up any spills right away, including grease or oil spills. What other actions can I take?  Wear closed-toe shoes that fit well  and support your feet. Wear shoes that have rubber soles or low heels.  Use mobility aids as needed, such as canes, walkers, scooters, and crutches.  Review your medicines with your health care provider. Some medicines can cause dizziness or changes in blood pressure, which increase your risk of falling. Talk with your health care provider about other ways that you can decrease your risk of falls. This may include working with a physical therapist or trainer to improve your strength, balance, and endurance. Where to find more information  Centers for Disease Control and Prevention, STEADI: WebmailGuide.co.za  [employer] on Aging: BrainJudge.co.uk Contact a health care provider if:  You are afraid of falling at home.  You feel weak, drowsy, or dizzy at home.  You fall at home. Summary  There are many simple things that you can do to make your home safe and to help prevent falls.  Ways to make your home safe include removing tripping hazards and installing grab bars in the bathroom.  Ask for help when making these changes in your home. This information is not intended to replace advice given to you by your health care provider. Make sure you discuss any questions you have with your health care provider. Document Released: 07/18/2002 Document Revised: 07/10/2017 Document Reviewed: 03/12/2017 Elsevier Patient Education  2020 [employer].

## 2019-05-21 LAB — PAIN MGMT, TRAMADOL W/MEDMATCH, U
Desmethyltramadol: 2837 ng/mL
Tramadol: 6398 ng/mL

## 2019-05-21 LAB — PAIN MGMT, PROFILE 8 W/CONF, U
6 Acetylmorphine: NEGATIVE ng/mL
Alcohol Metabolites: NEGATIVE ng/mL (ref <500)
Amphetamines: NEGATIVE ng/mL
Benzodiazepines: NEGATIVE ng/mL
Buprenorphine, Urine: NEGATIVE ng/mL
Cocaine Metabolite: NEGATIVE ng/mL
Creatinine: 53.5 mg/dL
MDMA: NEGATIVE ng/mL
Marijuana Metabolite: NEGATIVE ng/mL
Opiates: NEGATIVE ng/mL
Oxidant: NEGATIVE ug/mL
Oxycodone: NEGATIVE ng/mL
pH: 7.2 (ref 4.5–9.0)

## 2019-05-21 NOTE — Assessment & Plan Note (Signed)
--  Tdap: 02/18/10 - PNA 23:  02/18/10 - prevnar 10/11/13 - zostavax 02/26/11 - s/p shingrix @ the pharmacy per pt -  s/p flu shot   --No further paps, see previous entries  --no further breast ca screening which is within the guidelines.  --CCS: 2 siblings w/ colon ca; last cscope  2013 , Dr Fuller Plan,, diverticulosis, next 2018: still not ready to proceed, "maybe next year" - Diet and exercise, fall prevention discussed  -- labs: CMP, FLP, CBC, A1c, TSH, UDS

## 2019-05-21 NOTE — Assessment & Plan Note (Signed)
HTN: Continue Hyzaar, Imdur.  Checking labs Hypothyroidism: Continue levothyroxine, checking labs Hyperlipidemia: On Crestor, checking labs CAD, remote TIA: Controlling CV RF.  No symptoms Osteoporosis: On Prolia, check DEXA DJD: Currently her main concern is hip pain, on Ultram twice a day, she seems to be having discomfort, she is reluctant to see Ortho due to the quarantine and risk of COVID-19.    I advised her to proceed, offices are taking great COVID-19 precautions. Ok to increase tramadol from BID to TID prn UDS today RTC 6 months

## 2019-06-03 ENCOUNTER — Telehealth: Payer: Self-pay | Admitting: Internal Medicine

## 2019-06-03 NOTE — Telephone Encounter (Signed)
Sent!

## 2019-06-03 NOTE — Telephone Encounter (Signed)
Tramadol refill.   Last OV: 05/19/2019 Last Fill: 03/07/2019 #180 and 0RF Pt sig: 1 tab tid prn UDS: 05/19/2019 Low risk

## 2019-07-04 ENCOUNTER — Other Ambulatory Visit: Payer: Self-pay | Admitting: Internal Medicine

## 2019-08-15 ENCOUNTER — Other Ambulatory Visit: Payer: Self-pay | Admitting: Internal Medicine

## 2019-08-17 ENCOUNTER — Ambulatory Visit: Payer: Medicare Other | Admitting: Orthopaedic Surgery

## 2019-08-21 ENCOUNTER — Other Ambulatory Visit: Payer: Self-pay | Admitting: Internal Medicine

## 2019-08-23 ENCOUNTER — Ambulatory Visit (INDEPENDENT_AMBULATORY_CARE_PROVIDER_SITE_OTHER): Payer: Medicare PPO

## 2019-08-23 ENCOUNTER — Other Ambulatory Visit: Payer: Self-pay

## 2019-08-23 ENCOUNTER — Ambulatory Visit: Payer: Medicare PPO | Admitting: Orthopaedic Surgery

## 2019-08-23 ENCOUNTER — Encounter: Payer: Self-pay | Admitting: Orthopaedic Surgery

## 2019-08-23 VITALS — BP 142/89 | HR 83 | Ht 62.0 in | Wt 152.0 lb

## 2019-08-23 DIAGNOSIS — M79605 Pain in left leg: Secondary | ICD-10-CM

## 2019-08-23 DIAGNOSIS — G8929 Other chronic pain: Secondary | ICD-10-CM

## 2019-08-23 DIAGNOSIS — M7062 Trochanteric bursitis, left hip: Secondary | ICD-10-CM

## 2019-08-23 DIAGNOSIS — M545 Low back pain, unspecified: Secondary | ICD-10-CM

## 2019-08-23 MED ORDER — METHYLPREDNISOLONE ACETATE 40 MG/ML IJ SUSP
40.0000 mg | INTRAMUSCULAR | Status: AC | PRN
  Administered 2019-08-23: 40 mg via INTRA_ARTICULAR

## 2019-08-23 MED ORDER — LIDOCAINE HCL 1 % IJ SOLN
0.5000 mL | INTRAMUSCULAR | Status: AC | PRN
  Administered 2019-08-23: .5 mL

## 2019-08-23 MED ORDER — BUPIVACAINE HCL 0.25 % IJ SOLN
2.0000 mL | INTRAMUSCULAR | Status: AC | PRN
  Administered 2019-08-23: 14:00:00 2 mL via INTRA_ARTICULAR

## 2019-08-23 NOTE — Progress Notes (Signed)
Office Visit Note   Patient: Kathleen Thompson           Date of Birth: Feb 02, 1934           MRN: [id number] Visit Date: 08/23/2019              Requested by: Colon Branch, [address] [place],  [place] [address] PCP: Colon Branch, MD   Assessment & Plan: Visit Diagnoses:  1. Trochanteric bursitis, left hip   2. Pain in left leg   3. Chronic left-sided low back pain, unspecified whether sciatica present     Plan: Injection performed which she tolerated well.  Return as needed.  Follow-Up Instructions: No follow-ups on file.   Orders:  Orders Placed This Encounter  Procedures  . Large Joint Inj  . XR Lumbar Spine 2-3 Views  . XR Pelvis 1-2 Views   No orders of the defined types were placed in this encounter.     Procedures: Large Joint Inj: L greater trochanter on 08/23/2019 1:47 PM Details: 22 G needle Medications: 0.5 mL lidocaine 1 %; 40 mg methylPREDNISolone acetate 40 MG/ML; 2 mL bupivacaine 0.25 %      Clinical Data: No additional findings.   Subjective: Chief Complaint  Patient presents with  . Lower Back - Pain  . Left Leg - Pain    HPI 86 year old female returns with recurrent left trochanteric bursitis similar symptoms that she had in 2018.  When she first gets up it started take a step she has some problems with balance due to left trochanteric pain.  She has some chronic back pain and has some disc degeneration with protrusion at L2-3 primarily on the right side.  Previous cervical fusion which is stable.  Review of Systems 14 point review of systems updated unchanged from last office visit other than as mentioned HPI.  Objective: Vital Signs: BP (!) 142/89   Pulse 83   Ht 5\' 2"  (1.575 m)   Wt 152 lb (68.9 kg)   BMI 27.80 kg/m   Physical Exam Constitutional:      Appearance: She is well-developed.  HENT:     Head: Normocephalic.     Right Ear: External ear normal.     Left Ear: External ear normal.  Eyes:     Pupils:  Pupils are equal, round, and reactive to light.  Neck:     Thyroid: No thyromegaly.     Trachea: No tracheal deviation.  Cardiovascular:     Rate and Rhythm: Normal rate.  Pulmonary:     Effort: Pulmonary effort is normal.  Abdominal:     Palpations: Abdomen is soft.  Skin:    General: Skin is warm and dry.  Neurological:     Mental Status: She is alert and oriented to person, place, and time.  Psychiatric:        Behavior: Behavior normal.     Ortho Exam patient has exquisite tenderness over the left greater trochanter.  Negative logroll of her hips minimal sciatic notch tenderness.  Knee and ankle jerk are intact.  Minimal tenderness over the right greater trochanter.  Specialty Comments:  No specialty comments available.  Imaging: XR Lumbar Spine 2-3 Views  Result Date: 08/23/2019 AP lateral lumbar spine x-rays demonstrates 30 degree right lumbar curve apex to the L L3-4 disc space.  Negative for compete compression fracture. Impression: Lumbar curvature with facet degenerative changes.  XR Pelvis 1-2 Views  Result Date: 08/23/2019  AP pelvis showing both hips demonstrates mild obliquity of the pelvis.  Hip joint sacroiliac joints are normal.  Lumbar curvature noted described in the lumbar images.  Femoral necks are intact bone negative for acute changes. Impression: Negative for hip degenerative changes.  No acute bony changes.    PMFS History: Patient Active Problem List   Diagnosis Date Noted  . DJD (degenerative joint disease) 12/15/2018  . S/P cervical spinal fusion 01/05/2018  . Protrusion of cervical intervertebral disc 01/05/2018  . Stenosing tenosynovitis of wrist 01/11/2017  . Cervicalgia 01/11/2017  . Trochanteric bursitis, left hip 12/02/2016  . h/o TIA   11/18/2016  . Osteoporosis 06/22/2016  . PCP NOTES >>> 05/21/2015  . Annual physical exam 12/12/2014  . Granuloma RUL resected 03/14/13  12/19/2012  . ESOPHAGEAL STRICTURE 11/26/2009  . Family history of  malignant neoplasm of gastrointestinal tract 09/17/2009  . Cough variant asthma 09/11/2009  . PERSONAL HX COLONIC POLYPS 12/25/2008  . Dyspnea 03/17/2008  . PALPITATIONS, RECURRENT 02/02/2008  . ADENOMATOUS COLONIC POLYP 12/23/2007  . GERD 12/23/2007  . GASTROPARESIS 12/23/2007  . Hypothyroidism 03/12/2007  . Hyperlipemia 03/12/2007  . HTN (hypertension) 03/12/2007  . CAD (coronary artery disease) 03/12/2007  . INTERNAL HEMORRHOIDS 06/01/2006  . DIVERTICULOSIS, COLON 06/01/2006   Past Medical History:  Diagnosis Date  . Adenomatous colon polyp 02/2003  . Arthritis   . Blood transfusion   . Cataract   . Diverticulosis   . Esophageal stricture   . Gastroparesis   . GERD (gastroesophageal reflux disease)   . Hyperlipidemia   . Hypertension   . Hypothyroidism   . Internal hemorrhoid   . Iron deficiency anemia   . Osteoporosis   . PONV (postoperative nausea and vomiting)   . PVD (peripheral vascular disease) ([place])   . Stroke [facility])    TIA  . TIA (transient ischemic attack)     Family History  Problem Relation Age of Onset  . Diabetes Mother   . Heart attack Mother   . Heart attack Father   . Asthma Father   . Colon cancer Brother        at age 44  . Colon cancer Sister        at age 74  . Diabetes Maternal Aunt   . Diabetes Maternal Uncle   . Heart attack Sister   . Heart attack Brother   . Heart attack Brother   . Heart attack Brother   . Heart attack Brother   . Heart attack Sister   . Breast cancer Neg Hx     Past Surgical History:  Procedure Laterality Date  . ABDOMINAL HYSTERECTOMY    . APPENDECTOMY    . CERVICAL FUSION    . EYE SURGERY Bilateral 08/2016   cataracts  . LEFT HEART CATHETERIZATION WITH CORONARY ANGIOGRAM N/A 12/20/2012   Procedure: LEFT HEART CATHETERIZATION WITH CORONARY ANGIOGRAM;  Surgeon: Peter M Martinique, MD;  Location: [facility] CATH LAB;  Service: Cardiovascular;  Laterality: N/A;  . shoulder surgery     . VIDEO ASSISTED THORACOSCOPY  (VATS)/WEDGE RESECTION Right 03/14/2013   Procedure: VIDEO ASSISTED THORACOSCOPY (VATS)/WEDGE RESECTION;  Surgeon: Melrose Nakayama, MD;  Location: [facility];  Service: Thoracic;  Laterality: Right;   Social History   Occupational History  . Occupation: Retired-[employer]     Employer: RETIRED  Tobacco Use  . Smoking status: Never Smoker  . Smokeless tobacco: Never Used  Substance and Sexual Activity  . Alcohol use: No  . Drug use: No  .  Sexual activity: Yes

## 2019-09-02 ENCOUNTER — Telehealth: Payer: Self-pay | Admitting: Internal Medicine

## 2019-09-05 NOTE — Telephone Encounter (Signed)
Sent!

## 2019-09-05 NOTE — Telephone Encounter (Signed)
Tramadol refill.   Last OV: 05/19/2019 Last Fill: 06/03/2019 #180 and 0rf Pt sig: 1 tab bid prn UDS: 05/19/2019 Low risk

## 2019-09-06 ENCOUNTER — Ambulatory Visit: Payer: Medicare PPO

## 2019-09-15 ENCOUNTER — Ambulatory Visit: Payer: Medicare PPO | Attending: Internal Medicine

## 2019-09-15 DIAGNOSIS — Z23 Encounter for immunization: Secondary | ICD-10-CM | POA: Insufficient documentation

## 2019-09-15 NOTE — Progress Notes (Signed)
   Covid-19 Vaccination Clinic  Name:  Kathleen Thompson    MRN: [id number] DOB: [birth year]  09/15/2019  [name] was observed post Covid-19 immunization for 15 minutes without incidence. She was provided with Vaccine Information Sheet and instruction to access the V-Safe system.   [name] was instructed to call 911 with any severe reactions post vaccine: Marland Kitchen Difficulty breathing  . Swelling of your face and throat  . A fast heartbeat  . A bad rash all over your body  . Dizziness and weakness    Immunizations Administered    Name Date Dose VIS Date Route   Pfizer COVID-19 Vaccine 09/15/2019 12:12 PM 0.3 mL 07/22/2019 Intramuscular   Manufacturer: [place]   Lot: [id number]   [place]: [id number]

## 2019-09-20 ENCOUNTER — Telehealth: Payer: Self-pay | Admitting: Internal Medicine

## 2019-09-20 NOTE — Telephone Encounter (Signed)
Caller Name: Curt Bears w/[facility] on [address] Phone: [phone]  Meds were mislabeled from pharmacy and they are notifying Dr. Larose Kells that Ms. Legette took 2/day of rosuvastatin for approximately 2-3 days. Medications have now been corrected by the pharmacy.

## 2019-09-20 NOTE — Telephone Encounter (Signed)
Okay, simply go back to take 1 a day.  If she has any unusual aches or pains let me know.

## 2019-09-20 NOTE — Telephone Encounter (Signed)
Spoke w/ Pt- she informed this was an issue w/ the meds from last month- but has been corrected- she is back to taking 1 tab daily and is not having any unusual aches or pains.

## 2019-09-20 NOTE — Telephone Encounter (Signed)
FYI. Please advise.

## 2019-09-22 ENCOUNTER — Other Ambulatory Visit: Payer: Self-pay

## 2019-09-22 ENCOUNTER — Ambulatory Visit (INDEPENDENT_AMBULATORY_CARE_PROVIDER_SITE_OTHER): Payer: Medicare PPO

## 2019-09-22 DIAGNOSIS — M810 Age-related osteoporosis without current pathological fracture: Secondary | ICD-10-CM

## 2019-09-22 MED ORDER — DENOSUMAB 60 MG/ML ~~LOC~~ SOSY
60.0000 mg | PREFILLED_SYRINGE | Freq: Once | SUBCUTANEOUS | Status: AC
  Administered 2019-09-22: 60 mg via SUBCUTANEOUS

## 2019-09-30 NOTE — Progress Notes (Signed)
Prolia provided today. Kathlene November, MD

## 2019-10-11 ENCOUNTER — Ambulatory Visit: Payer: Medicare PPO | Attending: Internal Medicine

## 2019-10-11 ENCOUNTER — Other Ambulatory Visit: Payer: Self-pay | Admitting: Internal Medicine

## 2019-10-11 DIAGNOSIS — Z23 Encounter for immunization: Secondary | ICD-10-CM | POA: Insufficient documentation

## 2019-10-11 NOTE — Progress Notes (Signed)
   Covid-19 Vaccination Clinic  Name:  Kathleen Thompson    MRN: [id number] DOB: Oct 24, 1933  10/11/2019  [name] was observed post Covid-19 immunization for 15 minutes without incident. She was provided with Vaccine Information Sheet and instruction to access the V-Safe system.   [name] was instructed to call 911 with any severe reactions post vaccine: Marland Kitchen Difficulty breathing  . Swelling of face and throat  . A fast heartbeat  . A bad rash all over body  . Dizziness and weakness   Immunizations Administered    Name Date Dose VIS Date Route   Pfizer COVID-19 Vaccine 10/11/2019 11:50 AM 0.3 mL 07/22/2019 Intramuscular   Manufacturer: [place]   Lot: [id number]   [place]: [id number]

## 2019-11-11 ENCOUNTER — Other Ambulatory Visit: Payer: Self-pay | Admitting: Internal Medicine

## 2019-11-18 ENCOUNTER — Ambulatory Visit: Payer: Medicare Other | Admitting: Internal Medicine

## 2019-12-03 ENCOUNTER — Telehealth: Payer: Self-pay | Admitting: Internal Medicine

## 2019-12-05 NOTE — Telephone Encounter (Signed)
Tramadol refill.   Last OV: 05/19/2019, appt scheduled 01/05/20 Last Fill: 09/05/2019 #180 and 0RF Pt sig: 1 tab bid prn UDS: 05/19/2019 Low risk

## 2019-12-05 NOTE — Telephone Encounter (Signed)
PDMP okay, Rx sent 

## 2019-12-06 ENCOUNTER — Ambulatory Visit: Payer: Medicare PPO | Admitting: Internal Medicine

## 2020-01-05 ENCOUNTER — Encounter: Payer: Self-pay | Admitting: Internal Medicine

## 2020-01-05 ENCOUNTER — Other Ambulatory Visit: Payer: Self-pay

## 2020-01-05 ENCOUNTER — Ambulatory Visit: Payer: Medicare PPO | Admitting: Internal Medicine

## 2020-01-05 VITALS — BP 142/72 | HR 79 | Temp 97.8°F | Resp 18 | Ht 62.0 in | Wt 155.2 lb

## 2020-01-05 DIAGNOSIS — W19XXXA Unspecified fall, initial encounter: Secondary | ICD-10-CM

## 2020-01-05 DIAGNOSIS — R42 Dizziness and giddiness: Secondary | ICD-10-CM

## 2020-01-05 DIAGNOSIS — I10 Essential (primary) hypertension: Secondary | ICD-10-CM

## 2020-01-05 DIAGNOSIS — E119 Type 2 diabetes mellitus without complications: Secondary | ICD-10-CM | POA: Diagnosis not present

## 2020-01-05 DIAGNOSIS — E039 Hypothyroidism, unspecified: Secondary | ICD-10-CM

## 2020-01-05 LAB — CBC WITH DIFFERENTIAL/PLATELET
Basophils Absolute: 0 10*3/uL (ref 0.0–0.1)
Basophils Relative: 0.6 % (ref 0.0–3.0)
Eosinophils Absolute: 0.1 10*3/uL (ref 0.0–0.7)
Eosinophils Relative: 2 % (ref 0.0–5.0)
HCT: 41.5 % (ref 36.0–46.0)
Hemoglobin: 14.2 g/dL (ref 12.0–15.0)
Lymphocytes Relative: 43.7 % (ref 12.0–46.0)
Lymphs Abs: 2.1 10*3/uL (ref 0.7–4.0)
MCHC: 34.2 g/dL (ref 30.0–36.0)
MCV: 94.5 fl (ref 78.0–100.0)
Monocytes Absolute: 0.3 10*3/uL (ref 0.1–1.0)
Monocytes Relative: 7 % (ref 3.0–12.0)
Neutro Abs: 2.3 10*3/uL (ref 1.4–7.7)
Neutrophils Relative %: 46.7 % (ref 43.0–77.0)
Platelets: 239 10*3/uL (ref 150.0–400.0)
RBC: 4.39 Mil/uL (ref 3.87–5.11)
RDW: 13.3 % (ref 11.5–15.5)
WBC: 4.9 10*3/uL (ref 4.0–10.5)

## 2020-01-05 LAB — HEMOGLOBIN A1C: Hgb A1c MFr Bld: 6.3 % (ref 4.6–6.5)

## 2020-01-05 LAB — BASIC METABOLIC PANEL
BUN: 10 mg/dL (ref 6–23)
CO2: 26 mEq/L (ref 19–32)
Calcium: 9.5 mg/dL (ref 8.4–10.5)
Chloride: 102 mEq/L (ref 96–112)
Creatinine, Ser: 0.9 mg/dL (ref 0.40–1.20)
GFR: 59.68 mL/min — ABNORMAL LOW (ref >60.00)
Glucose, Bld: 109 mg/dL — ABNORMAL HIGH (ref 70–99)
Potassium: 3.8 mEq/L (ref 3.5–5.1)
Sodium: 138 mEq/L (ref 135–145)

## 2020-01-05 LAB — TSH: TSH: 1.82 u[IU]/mL (ref 0.35–4.50)

## 2020-01-05 MED ORDER — TRIAMTERENE-HCTZ 37.5-25 MG PO TABS: 1.0000 | ORAL_TABLET | Freq: Every day | ORAL | 0 refills | Status: DC

## 2020-01-05 NOTE — Progress Notes (Signed)
Pre visit review using our clinic review tool, if applicable. No additional management support is needed unless otherwise documented below in the visit note. 

## 2020-01-05 NOTE — Progress Notes (Signed)
Subjective:    Patient ID: Kathleen Thompson, female    DOB: Dec 17, 1933, 86 y.o.   MRN: [id number]  DOS:  01/05/2020 Type of visit - description: Routine visit Has several concerns.  About 3 weeks ago, she was walking on her yard, was trying to step over a brick , lost her balance and fell on her knees.  Her face and hands landed on the grass. Still have some knee pain. (Of note is, this was a mechanical accident, not related to dizziness or drowsiness)  Also, because mild dizziness, she decided to take only half Hyzaar a day, she thought that probably Hyzaar was causing the issue. The dizziness is described as feeling off balance whenever she turns her head, last for few seconds. That feeling has actually improved since the decrease of BP meds. Ambulatory BPs remain normal.  She did notice lower extremity edema at the end of the day after she decreased the dose of Hyzaar.  She also takes tramadol twice a day, does not feel that it made her sleepy.  Review of Systems Other than above states she feels well. No chest pain no difficulty breathing.  No palpitation.  No DOE.  No headache, diplopia, slurred speech or motor deficit.  Past Medical History:  Diagnosis Date  . Adenomatous colon polyp 02/2003  . Arthritis   . Blood transfusion   . Cataract   . Diverticulosis   . Esophageal stricture   . Gastroparesis   . GERD (gastroesophageal reflux disease)   . Hyperlipidemia   . Hypertension   . Hypothyroidism   . Internal hemorrhoid   . Iron deficiency anemia   . Osteoporosis   . PONV (postoperative nausea and vomiting)   . PVD (peripheral vascular disease) ([place])   . Stroke [facility])    TIA  . TIA (transient ischemic attack)     Past Surgical History:  Procedure Laterality Date  . ABDOMINAL HYSTERECTOMY    . APPENDECTOMY    . CERVICAL FUSION    . EYE SURGERY Bilateral 08/2016   cataracts  . LEFT HEART CATHETERIZATION WITH CORONARY ANGIOGRAM N/A 12/20/2012   Procedure: LEFT  HEART CATHETERIZATION WITH CORONARY ANGIOGRAM;  Surgeon: Peter M Martinique, MD;  Location: [facility] CATH LAB;  Service: Cardiovascular;  Laterality: N/A;  . shoulder surgery     . VIDEO ASSISTED THORACOSCOPY (VATS)/WEDGE RESECTION Right 03/14/2013   Procedure: VIDEO ASSISTED THORACOSCOPY (VATS)/WEDGE RESECTION;  Surgeon: Melrose Nakayama, MD;  Location: [facility];  Service: Thoracic;  Laterality: Right;    Allergies as of 01/05/2020      Reactions   Clindamycin/lincomycin Rash   Cymbalta [duloxetine Hcl]    Jittery, nervous, could not sleep   Hydrocodone Hives   Rofecoxib    hives   Latex Rash   Pt states that latex causes blisters to skin      Medication List       Accurate as of Jan 05, 2020 11:07 AM. If you have any questions, ask your nurse or doctor.        aspirin 81 MG tablet Take 81 mg by mouth daily.   CALCIUM PO Take 1 tablet by mouth 2 (two) times daily.   clopidogrel 75 MG tablet Commonly known as: PLAVIX Take 1 tablet (75 mg total) by mouth daily.   CO Q-10 PO Take 1 tablet by mouth daily.   isosorbide mononitrate 30 MG 24 hr tablet Commonly known as: IMDUR Take 1 tablet (30 mg total) by mouth daily.  losartan-hydrochlorothiazide 100-12.5 MG tablet Commonly known as: HYZAAR Take 1/2 (one-half) tablet by mouth twice daily What changed: See the new instructions.   magnesium 30 MG tablet Take 30 mg by mouth daily.   pantoprazole 40 MG tablet Commonly known as: PROTONIX Take 1 tablet (40 mg total) by mouth daily before breakfast.   rosuvastatin 20 MG tablet Commonly known as: CRESTOR Take 1 tablet (20 mg total) by mouth daily.   Synthroid 50 MCG tablet Generic drug: levothyroxine TAKE 1 TABLET BY MOUTH ONCE DAILY BEFORE  BREAKFAST   traMADol 50 MG tablet Commonly known as: ULTRAM TAKE 1 TABLET BY MOUTH TWICE DAILY AS NEEDED FOR MODERATE PAIN   vitamin D (CHOLECALCIFEROL) 400 units tablet Take 400 Units by mouth daily.          Objective:    Physical Exam BP (!) 142/72 (BP Location: Left Arm, Patient Position: Sitting, Cuff Size: Small)   Pulse 79   Temp 97.8 F (36.6 C) (Temporal)   Resp 18   Ht 5\' 2"  (1.575 m)   Wt 155 lb 4 oz (70.4 kg)   SpO2 99%   BMI 28.40 kg/m  General:   Well developed, NAD, BMI noted. HEENT:  Normocephalic . Face symmetric, atraumatic. Neck: No JVD at 45 degrees Lungs:  CTA B Normal respiratory effort, no intercostal retractions, no accessory muscle use. Heart: RRR,  no murmur.  Lower extremities:  +/+++ Periankle and foot edema bilaterally, symmetric Trace edema pretibially. MSK: Mild ecchymosis slightly distal from the right knee.  Range of motion of the knees normal Skin: Not pale. Not jaundice Neurologic:  alert & oriented X3.  Speech normal, gait appropriate for age and unassisted Psych--  Cognition and judgment appear intact.  Cooperative with normal attention span and concentration.  Behavior appropriate. No anxious or depressed appearing.      Assessment    Assessment DM: DX 05-2019, A1c 6.6.  HTN Hypothyroidism Hyperlipidemia CV: --CAD cath 12/2012 single-vessel CAD,rx CV RF control, on Plavix, imdur --TIA remotely , apparently amaurosis fugax, was a started on on plavix per PCP, no further sx since. 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  No neck or other injuries.  This was a mechanical fall, not related to dizziness or drowsiness. Offered PT: declined, fall prevention discussed HTN: Self decrease Hyzaar to half tablet a day due to experiencing dizziness,  symptoms decreased since then but has developed lower extremity edema.  No DOE, chest pain.  Edema likely to be dependent. Will stop Hyzaar and start Maxide, hope that aslightly stronger diuretic dose will help with the swelling while keeping the BP well controlled without dizziness. Also recommend leg elevation and a low-salt diet We will check a BMP today and in 2 weeks.  Monitor BPs. Ankle edema: See above DM: New diagnosis with a A1c of 6.6 on October 2020, recheck A1c.  Explained the patient the diagnosis. Hypothyroidism: Check TSH Dizziness: As described above, suspect a peripheral issue, symptoms decreased after she cut down Hyzaar, overtreated BP?  For now recommend observation, see comments under HTN.  Check a CBC MSK: Seen by Manson Passey [id number] DX  with trochanteric bursitis, left.  Chronic pain managed with tramadol without apparent problems. RTC labs 2 weeks RTC to see me in 3 months  This visit occurred during the SARS-CoV-2 public health emergency.  Safety protocols were in place, including screening questions prior to the visit, additional usage of staff PPE, and extensive cleaning of exam room while observing appropriate contact time as indicated for disinfecting solutions.

## 2020-01-05 NOTE — Patient Instructions (Addendum)
Please schedule Medicare Wellness with Glenard Haring.   Per our records you are due for an eye exam. Please contact your eye doctor to schedule an appointment. Please have them send copies of your office visit notes to [place]. Our fax number is [phone].   Stop your current BP medication Start triamterene hydrochlorothiazide: 1 tablet in the morning.  If you feel dizzy okay to take only half.  Check your blood pressure 3 times a week. BP GOAL is between 110/65 and  135/85. Call me with your readings in 10 days  Low-salt diet Leg elevation   GO TO THE LAB : Get the blood work     [place], [place] back for blood work in 2 weeks  Come back for visit in 3 months   Fall Prevention in the Home, Adult Falls can cause injuries and can affect people from all age groups. There are many simple things that you can do to make your home safe and to help prevent falls. Ask for help when making these changes, if needed. What actions can I take to prevent falls? General instructions  Use good lighting in all rooms. Replace any light bulbs that burn out.  Turn on lights if it is dark. Use night-lights.  Place frequently used items in easy-to-reach places. Lower the shelves around your home if necessary.  Set up furniture so that there are clear paths around it. Avoid moving your furniture around.  Remove throw rugs and other tripping hazards from the floor.  Avoid walking on wet floors.  Fix any uneven floor surfaces.  Add color or contrast paint or tape to grab bars and handrails in your home. Place contrasting color strips on the first and last steps of stairways.  When you use a stepladder, make sure that it is completely opened and that the sides are firmly locked. Have someone hold the ladder while you are using it. Do not climb a closed stepladder.  Be aware of any and all pets. What can I do in the bathroom?      Keep the floor dry.  Immediately clean up any water that spills onto the floor.  Remove soap buildup in the tub or shower on a regular basis.  Use non-skid mats or decals on the floor of the tub or shower.  Attach bath mats securely with double-sided, non-slip rug tape.  If you need to sit down while you are in the shower, use a plastic, non-slip stool.  Install grab bars by the toilet and in the tub and shower. Do not use towel bars as grab bars. What can I do in the bedroom?  Make sure that a bedside light is easy to reach.  Do not use oversized bedding that drapes onto the floor.  Have a firm chair that has side arms to use for getting dressed. What can I do in the kitchen?  Clean up any spills right away.  If you need to reach for something above you, use a sturdy step stool that has a grab bar.  Keep electrical cables out of the way.  Do not use floor polish or wax that makes floors slippery. If you must use wax, make sure that it is non-skid floor wax. What can I do in the stairways?  Do not leave any items on the stairs.  Make sure that you have a light switch at the top of the stairs and the bottom of the stairs.  Have them installed if you do not have them.  Make sure that there are handrails on both sides of the stairs. Fix handrails that are broken or loose. Make sure that handrails are as long as the stairways.  Install non-slip stair treads on all stairs in your home.  Avoid having throw rugs at the top or bottom of stairways, or secure the rugs with carpet tape to prevent them from moving.  Choose a carpet design that does not hide the edge of steps on the stairway.  Check any carpeting to make sure that it is firmly attached to the stairs. Fix any carpet that is loose or worn. What can I do on the outside of my home?  Use bright outdoor lighting.  Regularly repair the edges of walkways and driveways and fix any cracks.  Remove high doorway thresholds.  Trim any shrubbery on  the main path into your home.  Regularly check that handrails are securely fastened and in good repair. Both sides of any steps should have handrails.  Install guardrails along the edges of any raised decks or porches.  Clear walkways of debris and clutter, including tools and rocks.  Have leaves, snow, and ice cleared regularly.  Use sand or salt on walkways during winter months.  In the garage, clean up any spills right away, including grease or oil spills. What other actions can I take?  Wear closed-toe shoes that fit well and support your feet. Wear shoes that have rubber soles or low heels.  Use mobility aids as needed, such as canes, walkers, scooters, and crutches.  Review your medicines with your health care provider. Some medicines can cause dizziness or changes in blood pressure, which increase your risk of falling. Talk with your health care provider about other ways that you can decrease your risk of falls. This may include working with a physical therapist or trainer to improve your strength, balance, and endurance. Where to find more information  Centers for Disease Control and Prevention, [employer]: WebmailGuide.co.za  [employer] on Aging: BrainJudge.co.uk Contact a health care provider if:  You are afraid of falling at home.  You feel weak, drowsy, or dizzy at home.  You fall at home. Summary  There are many simple things that you can do to make your home safe and to help prevent falls.  Ways to make your home safe include removing tripping hazards and installing grab bars in the bathroom.  Ask for help when making these changes in your home. This information is not intended to replace advice given to you by your health care provider. Make sure you discuss any questions you have with your health care provider. Document Revised: 07/10/2017 Document Reviewed: 03/12/2017 Elsevier Patient Education  2020 [employer].

## 2020-01-08 NOTE — Assessment & Plan Note (Signed)
Fall: Initial encounter, see description in HPI, landed on her knees, she is recuperating.  No neck or other injuries.  This was a mechanical fall, not related to dizziness or drowsiness. Offered PT: declined, fall prevention discussed HTN: Self decrease Hyzaar to half tablet a day due to experiencing dizziness, symptoms decreased since then but has developed lower extremity edema.  No DOE, chest pain.  Edema likely to be dependent. Will stop Hyzaar and start Maxide, hope that aslightly stronger diuretic dose will help with the swelling while keeping the BP well controlled without dizziness. Also recommend leg elevation and a low-salt diet We will check a BMP today and in 2 weeks.  Monitor BPs. Ankle edema: See above DM: New diagnosis with a A1c of 6.6 on October 2020, recheck A1c.  Explained the patient the diagnosis. Hypothyroidism: Check TSH Dizziness: As described above, suspect a peripheral issue, symptoms decreased after she cut down Hyzaar, overtreated BP?  For now recommend observation, see comments under HTN.  Check a CBC MSK: Seen by Ortho [id number] DX with trochanteric bursitis, left.  Chronic pain managed with tramadol without apparent problems. RTC labs 2 weeks RTC to see me in 3 months

## 2020-01-19 ENCOUNTER — Other Ambulatory Visit: Payer: Self-pay

## 2020-01-19 ENCOUNTER — Other Ambulatory Visit (INDEPENDENT_AMBULATORY_CARE_PROVIDER_SITE_OTHER): Payer: Medicare PPO

## 2020-01-19 DIAGNOSIS — I10 Essential (primary) hypertension: Secondary | ICD-10-CM | POA: Diagnosis not present

## 2020-01-19 LAB — BASIC METABOLIC PANEL
BUN: 9 mg/dL (ref 6–23)
CO2: 27 mEq/L (ref 19–32)
Calcium: 9.4 mg/dL (ref 8.4–10.5)
Chloride: 102 mEq/L (ref 96–112)
Creatinine, Ser: 0.93 mg/dL (ref 0.40–1.20)
GFR: 57.45 mL/min — ABNORMAL LOW (ref >60.00)
Glucose, Bld: 105 mg/dL — ABNORMAL HIGH (ref 70–99)
Potassium: 4.3 mEq/L (ref 3.5–5.1)
Sodium: 138 mEq/L (ref 135–145)

## 2020-01-24 ENCOUNTER — Telehealth: Payer: Self-pay | Admitting: Internal Medicine

## 2020-01-24 NOTE — Telephone Encounter (Signed)
Caller Dia Donate  Call Back # [phone]  Patient states Dr Larose Kells requested that she call back in regards to how she was doing after her last visit. Patient is requesting a call back from Nurse or Provider.

## 2020-01-24 NOTE — Telephone Encounter (Signed)
Unable to take Maxide due to dizziness and feeling sleepy, see above. Recommend: Continue monitoring BPs Recommend to take triamterene HCTZ half tablet a day and continue monitoring BPs. Advised to call me in 2 weeks and let me know how she is doing.

## 2020-01-24 NOTE — Telephone Encounter (Signed)
Spoke w/ Pt- she is unable to take triameterene-hctz- stated that it made her dizzy and very sleepy- she tried taking it twice but has since d/c. She is no longer having dizziness, but still having some leg swelling. Wants to know if there is a diuretic she can take for the leg swelling since her BPs have been normal at home.   BP readings:  108/69 120/88 145/79

## 2020-01-25 NOTE — Telephone Encounter (Signed)
Okay, will stop Maxide, watch salt intake, leg elevation as needed for lower extremity edema, monitor BPs.  If BPs are consistently elevated let me know otherwise RTC in 3 months.

## 2020-01-25 NOTE — Telephone Encounter (Signed)
Spoke w/ Pt- informed of recommendations. Pt verbalized understanding.  

## 2020-01-25 NOTE — Addendum Note (Signed)
Addended by: Kathlene November E on: 01/25/2020 10:52 AM   Modules accepted: Orders

## 2020-01-25 NOTE — Telephone Encounter (Signed)
Pt informed she even tried the 1/2 tablet daily and was unable to tolerate.

## 2020-02-09 ENCOUNTER — Telehealth: Payer: Self-pay | Admitting: Internal Medicine

## 2020-02-09 NOTE — Telephone Encounter (Signed)
Spoke w/ Pt- BP today 129/91, pulse 81, she has been having headaches- was woke overnight because of headache, denies any stroke like symptoms. Appt scheduled tomorrow.

## 2020-02-09 NOTE — Telephone Encounter (Signed)
Caller [name] Call Back # [phone]  Patient states that Kathleen Thompson took her off medication [name]) patient states she has a headache.patient would like to know if she needs to restart taking medication.  Please Advise

## 2020-02-10 ENCOUNTER — Ambulatory Visit: Payer: Medicare PPO | Admitting: Internal Medicine

## 2020-02-10 ENCOUNTER — Encounter (HOSPITAL_BASED_OUTPATIENT_CLINIC_OR_DEPARTMENT_OTHER): Payer: Self-pay | Admitting: *Deleted

## 2020-02-10 ENCOUNTER — Encounter: Payer: Self-pay | Admitting: Internal Medicine

## 2020-02-10 ENCOUNTER — Emergency Department (HOSPITAL_BASED_OUTPATIENT_CLINIC_OR_DEPARTMENT_OTHER): Payer: Medicare PPO

## 2020-02-10 ENCOUNTER — Emergency Department (HOSPITAL_BASED_OUTPATIENT_CLINIC_OR_DEPARTMENT_OTHER)
Admission: EM | Admit: 2020-02-10 | Discharge: 2020-02-10 | Disposition: A | Discharge status: Home / Self Care | Payer: Medicare PPO | Attending: Emergency Medicine | Admitting: Emergency Medicine

## 2020-02-10 ENCOUNTER — Other Ambulatory Visit: Payer: Self-pay

## 2020-02-10 VITALS — BP 147/84 | HR 82 | Temp 97.9°F | Resp 18 | Ht 62.0 in | Wt 153.0 lb

## 2020-02-10 DIAGNOSIS — Z91040 Latex allergy status: Secondary | ICD-10-CM | POA: Insufficient documentation

## 2020-02-10 DIAGNOSIS — E119 Type 2 diabetes mellitus without complications: Secondary | ICD-10-CM | POA: Insufficient documentation

## 2020-02-10 DIAGNOSIS — I10 Essential (primary) hypertension: Secondary | ICD-10-CM

## 2020-02-10 DIAGNOSIS — Z79899 Other long term (current) drug therapy: Secondary | ICD-10-CM | POA: Insufficient documentation

## 2020-02-10 DIAGNOSIS — Z7982 Long term (current) use of aspirin: Secondary | ICD-10-CM | POA: Diagnosis not present

## 2020-02-10 DIAGNOSIS — R519 Headache, unspecified: Secondary | ICD-10-CM

## 2020-02-10 DIAGNOSIS — E039 Hypothyroidism, unspecified: Secondary | ICD-10-CM | POA: Insufficient documentation

## 2020-02-10 DIAGNOSIS — R0602 Shortness of breath: Secondary | ICD-10-CM | POA: Diagnosis not present

## 2020-02-10 DIAGNOSIS — R0600 Dyspnea, unspecified: Secondary | ICD-10-CM

## 2020-02-10 DIAGNOSIS — I2510 Atherosclerotic heart disease of native coronary artery without angina pectoris: Secondary | ICD-10-CM | POA: Insufficient documentation

## 2020-02-10 DIAGNOSIS — R0609 Other forms of dyspnea: Secondary | ICD-10-CM

## 2020-02-10 LAB — COMPREHENSIVE METABOLIC PANEL
ALT: 17 U/L (ref 0–44)
AST: 29 U/L (ref 15–41)
Albumin: 4.4 g/dL (ref 3.5–5.0)
Alkaline Phosphatase: 61 U/L (ref 38–126)
Anion gap: 11 (ref 5–15)
BUN: 7 mg/dL — ABNORMAL LOW (ref 8–23)
CO2: 23 mmol/L (ref 22–32)
Calcium: 9.3 mg/dL (ref 8.9–10.3)
Chloride: 103 mmol/L (ref 98–111)
Creatinine, Ser: 0.91 mg/dL (ref 0.44–1.00)
GFR calc Af Amer: >60 mL/min (ref >60)
GFR calc non Af Amer: 58 mL/min — ABNORMAL LOW (ref >60)
Glucose, Bld: 113 mg/dL — ABNORMAL HIGH (ref 70–99)
Potassium: 3.8 mmol/L (ref 3.5–5.1)
Sodium: 137 mmol/L (ref 135–145)
Total Bilirubin: 0.8 mg/dL (ref 0.3–1.2)
Total Protein: 7.6 g/dL (ref 6.5–8.1)

## 2020-02-10 LAB — CBC WITH DIFFERENTIAL/PLATELET
Abs Immature Granulocytes: 0.01 10*3/uL (ref 0.00–0.07)
Basophils Absolute: 0 10*3/uL (ref 0.0–0.1)
Basophils Relative: 1 %
Eosinophils Absolute: 0.1 10*3/uL (ref 0.0–0.5)
Eosinophils Relative: 2 %
HCT: 43.5 % (ref 36.0–46.0)
Hemoglobin: 14.6 g/dL (ref 12.0–15.0)
Immature Granulocytes: 0 %
Lymphocytes Relative: 44 %
Lymphs Abs: 2.9 10*3/uL (ref 0.7–4.0)
MCH: 31.7 pg (ref 26.0–34.0)
MCHC: 33.6 g/dL (ref 30.0–36.0)
MCV: 94.4 fL (ref 80.0–100.0)
Monocytes Absolute: 0.5 10*3/uL (ref 0.1–1.0)
Monocytes Relative: 7 %
Neutro Abs: 3.2 10*3/uL (ref 1.7–7.7)
Neutrophils Relative %: 46 %
Platelets: 254 10*3/uL (ref 150–400)
RBC: 4.61 MIL/uL (ref 3.87–5.11)
RDW: 12.7 % (ref 11.5–15.5)
WBC: 6.7 10*3/uL (ref 4.0–10.5)
nRBC: 0 % (ref 0.0–0.2)

## 2020-02-10 LAB — URINALYSIS, ROUTINE W REFLEX MICROSCOPIC
Bilirubin Urine: NEGATIVE
Glucose, UA: NEGATIVE mg/dL
Hgb urine dipstick: NEGATIVE
Ketones, ur: NEGATIVE mg/dL
Nitrite: NEGATIVE
Protein, ur: NEGATIVE mg/dL
Specific Gravity, Urine: <1.005 — ABNORMAL LOW (ref 1.005–1.030)
pH: 6 (ref 5.0–8.0)

## 2020-02-10 LAB — URINALYSIS, MICROSCOPIC (REFLEX): RBC / HPF: NONE SEEN RBC/hpf (ref 0–5)

## 2020-02-10 LAB — SEDIMENTATION RATE: Sed Rate: 13 mm/hr (ref 0–22)

## 2020-02-10 LAB — TROPONIN I (HIGH SENSITIVITY): Troponin I (High Sensitivity): 3 ng/L (ref <18)

## 2020-02-10 MED ORDER — AMOXICILLIN 500 MG PO CAPS
500.0000 mg | ORAL_CAPSULE | Freq: Once | ORAL | Status: AC
  Administered 2020-02-10: 500 mg via ORAL
  Filled 2020-02-10: qty 1

## 2020-02-10 MED ORDER — AMOXICILLIN 500 MG PO CAPS
500.0000 mg | ORAL_CAPSULE | Freq: Three times a day (TID) | ORAL | 0 refills | Status: DC
Start: 2020-02-10 — End: 2020-02-28

## 2020-02-10 MED ORDER — DIPHENHYDRAMINE HCL 50 MG/ML IJ SOLN
12.5000 mg | Freq: Once | INTRAMUSCULAR | Status: AC
  Administered 2020-02-10: 12.5 mg via INTRAVENOUS
  Filled 2020-02-10: qty 1

## 2020-02-10 MED ORDER — SODIUM CHLORIDE 0.9 % IV BOLUS
500.0000 mL | Freq: Once | INTRAVENOUS | Status: AC
  Administered 2020-02-10: 500 mL via INTRAVENOUS

## 2020-02-10 MED ORDER — METOCLOPRAMIDE HCL 5 MG/ML IJ SOLN
5.0000 mg | Freq: Once | INTRAMUSCULAR | Status: AC
  Administered 2020-02-10: 5 mg via INTRAVENOUS
  Filled 2020-02-10: qty 2

## 2020-02-10 MED ORDER — TRAMADOL HCL 50 MG PO TABS
100.0000 mg | ORAL_TABLET | Freq: Once | ORAL | Status: AC
  Administered 2020-02-10: 100 mg via ORAL
  Filled 2020-02-10: qty 2

## 2020-02-10 NOTE — Patient Instructions (Addendum)
Please proceed to the emergency room downstairs

## 2020-02-10 NOTE — ED Triage Notes (Signed)
pt c/o SOB with exertion x 3 days h/a x 2 days, sent here from PMD office for eval

## 2020-02-10 NOTE — Progress Notes (Signed)
Pre visit review using our clinic review tool, if applicable. No additional management support is needed unless otherwise documented below in the visit note. 

## 2020-02-10 NOTE — ED Provider Notes (Signed)
[facility] EMERGENCY DEPARTMENT Provider Note   CSN: [id number] Arrival date & time: 02/10/20  1509     History Chief Complaint  Patient presents with   Shortness of Breath    Kathleen Thompson is a 86 y.o. female.  Pt presents to the ED today with sob and headache.  Headache started about 3 weeks ago.  The pt said she's noticed that she can't see as well with her reading glasses either.  She feels like this is with both eyes.  She said she has an appointment with her eye doctor at the end of July.  She denies f/c.  No cough.  No n/v.  She has been vaccinated against Covid.        Past Medical History:  Diagnosis Date   Adenomatous colon polyp 02/2003   Arthritis    Blood transfusion    Cataract    Diverticulosis    Esophageal stricture    Gastroparesis    GERD (gastroesophageal reflux disease)    Hyperlipidemia    Hypertension    Hypothyroidism    Internal hemorrhoid    Iron deficiency anemia    Osteoporosis    PONV (postoperative nausea and vomiting)    PVD (peripheral vascular disease) ([place])    Stroke ([place])    TIA   TIA (transient ischemic attack)     Patient Active Problem List   Diagnosis Date Noted   Diabetes mellitus without complication ([place]) 67/89/3810   DJD (degenerative joint disease) 12/15/2018   S/P cervical spinal fusion 01/05/2018   Protrusion of cervical intervertebral disc 01/05/2018   Stenosing tenosynovitis of wrist 01/11/2017   Cervicalgia 01/11/2017   Trochanteric bursitis, left hip 12/02/2016   h/o TIA   11/18/2016   Osteoporosis 06/22/2016   PCP NOTES >>> 05/21/2015   Annual physical exam 12/12/2014   Granuloma RUL resected 03/14/13  12/19/2012   ESOPHAGEAL STRICTURE 11/26/2009   Family history of malignant neoplasm of gastrointestinal tract 09/17/2009   Cough variant asthma 09/11/2009   PERSONAL HX COLONIC POLYPS 12/25/2008   Dyspnea 03/17/2008   PALPITATIONS, RECURRENT 02/02/2008    ADENOMATOUS COLONIC POLYP 12/23/2007   GERD 12/23/2007   GASTROPARESIS 12/23/2007   Hypothyroidism 03/12/2007   Hyperlipemia 03/12/2007   HTN (hypertension) 03/12/2007   CAD (coronary artery disease) 03/12/2007   INTERNAL HEMORRHOIDS 06/01/2006   DIVERTICULOSIS, COLON 06/01/2006    Past Surgical History:  Procedure Laterality Date   ABDOMINAL HYSTERECTOMY     APPENDECTOMY     CERVICAL FUSION     EYE SURGERY Bilateral 08/2016   cataracts   LEFT HEART CATHETERIZATION WITH CORONARY ANGIOGRAM N/A 12/20/2012   Procedure: LEFT HEART CATHETERIZATION WITH CORONARY ANGIOGRAM;  Surgeon: Peter M Martinique, MD;  Location: [facility] CATH LAB;  Service: Cardiovascular;  Laterality: N/A;   shoulder surgery      VIDEO ASSISTED THORACOSCOPY (VATS)/WEDGE RESECTION Right 03/14/2013   Procedure: VIDEO ASSISTED THORACOSCOPY (VATS)/WEDGE RESECTION;  Surgeon: Melrose Nakayama, MD;  Location: [facility];  Service: Thoracic;  Laterality: Right;     OB History   No obstetric history on file.     Family History  Problem Relation Age of Onset   Diabetes Mother    Heart attack Mother    Heart attack Father    Asthma Father    Colon cancer Brother        at age 17   Colon cancer Sister        at age 42   Diabetes Maternal Aunt  Diabetes Maternal Uncle    Heart attack Sister    Heart attack Brother    Heart attack Brother    Heart attack Brother    Heart attack Brother    Heart attack Sister    Breast cancer Neg Hx     Social History   Tobacco Use   Smoking status: Never Smoker   Smokeless tobacco: Never Used  Substance Use Topics   Alcohol use: No   Drug use: No    Home Medications Prior to Admission medications   Medication Sig Start Date End Date Taking? Authorizing Provider  amoxicillin (AMOXIL) 500 MG capsule Take 1 capsule (500 mg total) by mouth 3 (three) times daily. 02/10/20   Isla Pence, MD  aspirin 81 MG tablet Take 81 mg by mouth daily.     [provider]  CALCIUM PO Take 1 tablet by mouth 2 (two) times daily.    [provider]  clopidogrel (PLAVIX) 75 MG tablet Take 1 tablet (75 mg total) by mouth daily. 11/14/19   Colon Branch, MD  Coenzyme Q10 (CO Q-10 PO) Take 1 tablet by mouth daily.    [provider]  isosorbide mononitrate (IMDUR) 30 MG 24 hr tablet Take 1 tablet (30 mg total) by mouth daily. 06/03/19   Colon Branch, MD  magnesium 30 MG tablet Take 30 mg by mouth daily.      [provider]  pantoprazole (PROTONIX) 40 MG tablet Take 1 tablet (40 mg total) by mouth daily before breakfast. 07/04/19   Colon Branch, MD  rosuvastatin (CRESTOR) 20 MG tablet Take 1 tablet (20 mg total) by mouth daily. 10/12/19   Colon Branch, MD  SYNTHROID 50 MCG tablet TAKE 1 TABLET BY MOUTH ONCE DAILY BEFORE  BREAKFAST 08/22/19   Colon Branch, MD  traMADol (ULTRAM) 50 MG tablet TAKE 1 TABLET BY MOUTH TWICE DAILY AS NEEDED FOR MODERATE PAIN 12/05/19   Colon Branch, MD  vitamin D, CHOLECALCIFEROL, 400 UNITS tablet Take 400 Units by mouth daily.      [provider]    Allergies    Clindamycin/lincomycin, Cymbalta [duloxetine hcl], Hydrocodone, Rofecoxib, and Latex  Review of Systems   Review of Systems  Respiratory: Positive for shortness of breath.   Neurological: Positive for headaches.  All other systems reviewed and are negative.   Physical Exam Updated Vital Signs BP 116/90    Pulse 67    Temp 98.2 F (36.8 C) (Oral)    Resp 18    Ht '5\' 2"'$  (1.575 m)    Wt 69.4 kg    SpO2 98%    BMI 27.98 kg/m   Physical Exam Vitals and nursing note reviewed.  Constitutional:      Appearance: She is well-developed.  HENT:     Head: Normocephalic and atraumatic.     Mouth/Throat:     Mouth: Mucous membranes are moist.     Pharynx: Oropharynx is clear.  Eyes:     Extraocular Movements: Extraocular movements intact.     Pupils: Pupils are equal, round, and reactive to light.  Cardiovascular:     Rate and  Rhythm: Normal rate and regular rhythm.  Pulmonary:     Effort: Pulmonary effort is normal.     Breath sounds: Normal breath sounds.  Abdominal:     General: Bowel sounds are normal.     Palpations: Abdomen is soft.  Musculoskeletal:        General: Normal range  of motion.     Cervical back: Normal range of motion and neck supple.  Skin:    General: Skin is warm.     Capillary Refill: Capillary refill takes less than 2 seconds.  Neurological:     General: No focal deficit present.     Mental Status: She is alert and oriented to person, place, and time.  Psychiatric:        Mood and Affect: Mood normal.        Behavior: Behavior normal.     ED Results / Procedures / Treatments   Labs (all labs ordered are listed, but only abnormal results are displayed) Labs Reviewed  COMPREHENSIVE METABOLIC PANEL - Abnormal; Notable for the following components:      Result Value   Glucose, Bld 113 (*)    BUN 7 (*)    GFR calc non Af Amer 58 (*)    All other components within normal limits  URINALYSIS, ROUTINE W REFLEX MICROSCOPIC - Abnormal; Notable for the following components:   Color, Urine STRAW (*)    Specific Gravity, Urine <1.005 (*)    Leukocytes,Ua SMALL (*)    All other components within normal limits  URINALYSIS, MICROSCOPIC (REFLEX) - Abnormal; Notable for the following components:   Bacteria, UA FEW (*)    All other components within normal limits  CBC WITH DIFFERENTIAL/PLATELET  SEDIMENTATION RATE  TROPONIN I (HIGH SENSITIVITY)    EKG EKG Interpretation  Date/Time:  Friday February 10 2020 15:13:58 EDT Ventricular Rate:  72 PR Interval:  144 QRS Duration: 80 QT Interval:  382 QTC Calculation: 418 R Axis:   45 Text Interpretation: Normal sinus rhythm with sinus arrhythmia Possible Left atrial enlargement Borderline ECG No significant change since last tracing Confirmed by Jacalyn Lefevre [phone]) on 02/10/2020 3:28:33 PM   Radiology DG Chest 2 View  Result Date:  02/10/2020 CLINICAL DATA:  Headache and shortness of breath x3 weeks. EXAM: CHEST - 2 VIEW COMPARISON:  June 12, 2018 FINDINGS: The lungs are hyperinflated. Surgical sutures are again seen overlying the mid and upper right lung. There is no evidence of acute infiltrate, pleural effusion or pneumothorax. Radiopaque surgical clips are seen overlying the lateral aspect of the lower right hemithorax. The heart size and mediastinal contours are within normal limits. There is moderate severity calcification of the thoracic aorta. The visualized skeletal structures are unremarkable. IMPRESSION: Stable postoperative changes in the right lung. Electronically Signed   By: Aram Candela M.D.   On: 02/10/2020 16:26   CT Head Wo Contrast  Result Date: 02/10/2020 CLINICAL DATA:  Headache, posttraumatic. Additional history provided: Fall 6 weeks ago, headache which began 3 weeks ago, patient on blood thinners, dizziness. EXAM: CT HEAD WITHOUT CONTRAST TECHNIQUE: Contiguous axial images were obtained from the base of the skull through the vertex without intravenous contrast. COMPARISON:  No pertinent prior studies available for comparison. FINDINGS: Brain: Cerebral volume is normal for age. Mild patchy hypoattenuation within the cerebral white matter is nonspecific, but consistent with chronic small vessel ischemic disease. There is no acute intracranial hemorrhage. No demarcated cortical infarct is identified. No extra-axial fluid collection. No evidence of intracranial mass. No midline shift. Vascular: No hyperdense vessel.  Atherosclerotic calcifications. Skull: Normal. Negative for fracture or focal lesion. Sinuses/Orbits: Visualized orbits show no acute finding. Mild ethmoid sinus mucosal thickening. Tiny left maxillary sinus mucous retention cyst. No significant mastoid effusion. IMPRESSION: No CT evidence of acute intracranial abnormality. Mild for age chronic small vessel ischemic disease  within the cerebral white  matter. Mild ethmoid sinus mucosal thickening. Tiny left maxillary sinus mucous retention cyst. Electronically Signed   By: Kellie Simmering DO   On: 02/10/2020 18:12    Procedures Procedures (including critical care time)  Medications Ordered in ED Medications  traMADol (ULTRAM) tablet 100 mg (has no administration in time range)  amoxicillin (AMOXIL) capsule 500 mg (has no administration in time range)  sodium chloride 0.9 % bolus 500 mL (0 mLs Intravenous Stopped 02/10/20 1905)  diphenhydrAMINE (BENADRYL) injection 12.5 mg (12.5 mg Intravenous Given 02/10/20 1814)  metoCLOPramide (REGLAN) injection 5 mg (5 mg Intravenous Given 02/10/20 1816)    ED Course  I have reviewed the triage vital signs and the nursing notes.  Pertinent labs & imaging results that were available during my care of the patient were reviewed by me and considered in my medical decision making (see chart for details).    MDM Rules/Calculators/A&P                            Visual Acuity  Right Eye Distance: 20/50 Left Eye Distance: 20/50 Bilateral Distance: 20/50  Right Eye Near:   Left Eye Near:    Bilateral Near:       [place] done without glasses  CT head nl.  Labs, including ESR neg.  Pt's pcp wanted to treat for a sinus infection, so pt is d/c home with amox.  Pt just received a rx for 60 tramadol pills last week.  She is instructed to take those.  She is also told to f/u with her eye doctor.  Return if worse.  Final Clinical Impression(s) / ED Diagnoses Final diagnoses:  Acute nonintractable headache, unspecified headache type    Rx / DC Orders ED Discharge Orders         Ordered    amoxicillin (AMOXIL) 500 MG capsule  3 times daily     Discontinue  Reprint     02/10/20 Oris Drone, MD 02/10/20 1924

## 2020-02-10 NOTE — ED Notes (Signed)
ED Provider at bedside. 

## 2020-02-10 NOTE — ED Notes (Signed)
Pt wears corrective lenses, does not have them with her, visual acuity test completed without prescribed glasses.  Pt denies any vision changes.

## 2020-02-10 NOTE — ED Notes (Signed)
Attempt IV access unsuccessful x1, additional nurse to attempt

## 2020-02-10 NOTE — ED Notes (Signed)
IV access via U/S unsuccessful x2, Dr Gilford Raid made aware, provider will make attempt to establish IV access, if unable plan is to change route to IM

## 2020-02-10 NOTE — Progress Notes (Signed)
Subjective:    Patient ID: Kathleen Thompson, female    DOB: Mar 27, 1934, 86 y.o.   MRN: [id number]  DOS:  02/10/2020 Type of visit - description: Acute, complain about headache and increased DOE  Headache started about a week ago, that is very unusual for her. The pain is located at the face (sinuses, forehead, bilateral), never goes away, increases with reading. Is associated with some nausea. Increases when she tries to read a book.  I ask about visual disturbances and reports vision is somewhat blurred but denies amaurosis fugax. I ask about neck pain or stiffness: "It always hurts".  She did have a fall a few weeks ago, see last visit, no further injuries.  Also, has chronic DOE, more noticeable in the last few days. States that actually she is now having some shortness of breath at rest. Denies chest pain, palpitations. Lower extremity edema better than before.  Recently BP medication changes, ambulatory BPs always good in the 120s/80s.    Wt Readings from Last 3 Encounters:  02/10/20 153 lb (69.4 kg)  01/05/20 155 lb 4 oz (70.4 kg)  08/23/19 152 lb (68.9 kg)     Review of Systems No fever chills No cough No vomiting or diarrhea Allergy symptoms?  Is unusual for her but she wakes up with a lot of mucus pooling in the throat, denies sneezing. Denies diplopia or motor deficits  Past Medical History:  Diagnosis Date  . Adenomatous colon polyp 02/2003  . Arthritis   . Blood transfusion   . Cataract   . Diverticulosis   . Esophageal stricture   . Gastroparesis   . GERD (gastroesophageal reflux disease)   . Hyperlipidemia   . Hypertension   . Hypothyroidism   . Internal hemorrhoid   . Iron deficiency anemia   . Osteoporosis   . PONV (postoperative nausea and vomiting)   . PVD (peripheral vascular disease) ([place])   . Stroke [facility])    TIA  . TIA (transient ischemic attack)     Past Surgical History:  Procedure Laterality Date  . ABDOMINAL HYSTERECTOMY    .  APPENDECTOMY    . CERVICAL FUSION    . EYE SURGERY Bilateral 08/2016   cataracts  . LEFT HEART CATHETERIZATION WITH CORONARY ANGIOGRAM N/A 12/20/2012   Procedure: LEFT HEART CATHETERIZATION WITH CORONARY ANGIOGRAM;  Surgeon: Peter M Martinique, MD;  Location: [facility] CATH LAB;  Service: Cardiovascular;  Laterality: N/A;  . shoulder surgery     . VIDEO ASSISTED THORACOSCOPY (VATS)/WEDGE RESECTION Right 03/14/2013   Procedure: VIDEO ASSISTED THORACOSCOPY (VATS)/WEDGE RESECTION;  Surgeon: Melrose Nakayama, MD;  Location: [facility];  Service: Thoracic;  Laterality: Right;    Allergies as of 02/10/2020      Reactions   Clindamycin/lincomycin Rash   Cymbalta [duloxetine Hcl]    Jittery, nervous, could not sleep   Hydrocodone Hives   Rofecoxib    hives   Latex Rash   Pt states that latex causes blisters to skin      Medication List       Accurate as of February 10, 2020  2:23 PM. If you have any questions, ask your nurse or doctor.        aspirin 81 MG tablet Take 81 mg by mouth daily.   CALCIUM PO Take 1 tablet by mouth 2 (two) times daily.   clopidogrel 75 MG tablet Commonly known as: PLAVIX Take 1 tablet (75 mg total) by mouth daily.   CO Q-10 PO Take  1 tablet by mouth daily.   isosorbide mononitrate 30 MG 24 hr tablet Commonly known as: IMDUR Take 1 tablet (30 mg total) by mouth daily.   magnesium 30 MG tablet Take 30 mg by mouth daily.   pantoprazole 40 MG tablet Commonly known as: PROTONIX Take 1 tablet (40 mg total) by mouth daily before breakfast.   rosuvastatin 20 MG tablet Commonly known as: CRESTOR Take 1 tablet (20 mg total) by mouth daily.   Synthroid 50 MCG tablet Generic drug: levothyroxine TAKE 1 TABLET BY MOUTH ONCE DAILY BEFORE  BREAKFAST   traMADol 50 MG tablet Commonly known as: ULTRAM TAKE 1 TABLET BY MOUTH TWICE DAILY AS NEEDED FOR MODERATE PAIN   vitamin D (CHOLECALCIFEROL) 400 units tablet Take 400 Units by mouth daily.          Objective:    Physical Exam BP (!) 147/84 (BP Location: Left Arm, Patient Position: Sitting, Cuff Size: Small)   Pulse 82   Temp 97.9 F (36.6 C) (Oral)   Resp 18   Ht 5\' 2"  (1.575 m)   Wt 153 lb (69.4 kg)   SpO2 97%   BMI 27.98 kg/m  General:   Well developed, she does seem somewhat uncomfortable compared to previous visits. HEENT:  Normocephalic . Face symmetric, atraumatic Throat symmetric, nose with minimal congestion, she is a slightly TTP at the bilateral maxillary areas and forehead. Not TTP at the temples where the temporaL arteries are. Lungs:  CTA B Normal respiratory effort, no intercostal retractions, no accessory muscle use. Heart: RRR,  no murmur.  Lower extremities: no pretibial edema bilaterally  Skin: Not pale. Not jaundice Neurologic:  alert & oriented X3.  Speech normal, gait appropriate for age and unassisted.  EOMI, pupils equal reactive Psych--  Cognition and judgment appear intact.  Cooperative with normal attention span and concentration.  Behavior appropriate. No anxious or depressed appearing.      Assessment     Assessment DM: DX 05-2019, A1c 6.6.  HTN Hypothyroidism Hyperlipidemia CV: --CAD cath 12/2012 single-vessel CAD,rx CV RF control, on Plavix, imdur --TIA remotely , apparently amaurosis fugax, was a started on on plavix per PCP, no further sx since. --On ASA and Plavix: Discuss 11/17/2016 Osteoporosis -  DEXAs @ [facility], T score -3.9 (12/2015).Prolia #1:  07/2016 MSK: Dr Lorin Mercy --DJD- s/p neck and shoulder surgeries ; + DJD cervical spine --knee pain: on tramadol rx per pcp GI: GED, gastroparesis, diverticulosis, esophageal stricture H/o Iron deficiency anemia H/o granuloma RUL, resected 03-14-13  PLAN: Headache: New onset, used to have headaches 20 years ago, given location this could be simply a sinus infection or allergies but she seems to be slightly uncomfortable and further work-up is indicated. DOE: Chronic DOE, slightly worse in the last  few days, sometimes at rest.  Recently BP meds were adjusted chronic edema is better, she has lost 2 pounds since her last visit, no chest pain, no palpitations. EKG today at baseline. HTN: At the last visit Hyzaar was stopped and she is started Maxide, subsequent BMP was good.  Ambulatory blood pressures are always in the 120s over 80s, one time diastolic BP was 91. Plan: I discussed with the patient work-up here (stat CT head, chest x-ray, CBC, sed rate for new onset of headache, if work-up negative possibly treat her for a sinus infection) versus sending her to the emergency room. At the end we agreed to send her to the ER. I spoke with the ER physician who accept the  patient, appreciate her help.   This visit occurred during the SARS-CoV-2 public health emergency.  Safety protocols were in place, including screening questions prior to the visit, additional usage of staff PPE, and extensive cleaning of exam room while observing appropriate contact time as indicated for disinfecting solutions.

## 2020-02-10 NOTE — ED Provider Notes (Signed)
EMERGENCY DEPARTMENT  US GUIDANCE EXAM Emergency Ultrasound:  US Guidance for Needle Guidance  INDICATIONS: Difficult vascular access Linear probe used in real-time to visualize location of needle entry through skin.   PERFORMED BY: Myself IMAGES ARCHIVED?: No LIMITATIONS: None VIEWS USED: Transverse INTERPRETATION: Needle visualized within vein, 20g R upper arm   Carlisle Cater, PA-C 02/10/20 Marijo File, MD 02/10/20 2033

## 2020-02-10 NOTE — ED Notes (Signed)
ED Nursing Procedure Note. Attempted x 2 with vascular ultrasound to obtain IV access, unsuccessful x 2. EDP informed. Dsg applied.

## 2020-02-14 NOTE — Assessment & Plan Note (Signed)
Headache: New onset, used to have headaches 20 years ago, given location this could be simply a sinus infection or allergies but she seems to be slightly uncomfortable and further work-up is indicated. DOE: Chronic DOE, slightly worse in the last few days, sometimes at rest.  Recently BP meds were adjusted chronic edema is better, she has lost 2 pounds since her last visit, no chest pain, no palpitations. EKG today at baseline. HTN: At the last visit Hyzaar was stopped and she is started Maxide, subsequent BMP was good.  Ambulatory blood pressures are always in the 120s over 80s, one time diastolic BP was 91. Plan: I discussed with the patient work-up here (stat CT head, chest x-ray, CBC, sed rate for new onset of headache, if work-up negative possibly treat her for a sinus infection) versus sending her to the emergency room. At the end we agreed to send her to the ER. I spoke with the ER physician who accept the patient, appreciate her help.

## 2020-02-20 DIAGNOSIS — H2513 Age-related nuclear cataract, bilateral: Secondary | ICD-10-CM | POA: Diagnosis not present

## 2020-02-20 LAB — HM DIABETES EYE EXAM

## 2020-02-24 ENCOUNTER — Encounter: Payer: Self-pay | Admitting: Internal Medicine

## 2020-02-27 ENCOUNTER — Encounter (HOSPITAL_BASED_OUTPATIENT_CLINIC_OR_DEPARTMENT_OTHER): Payer: Self-pay | Admitting: Emergency Medicine

## 2020-02-27 ENCOUNTER — Other Ambulatory Visit: Payer: Self-pay

## 2020-02-27 ENCOUNTER — Emergency Department (HOSPITAL_BASED_OUTPATIENT_CLINIC_OR_DEPARTMENT_OTHER)
Admission: EM | Admit: 2020-02-27 | Discharge: 2020-02-27 | Disposition: A | Discharge status: Home / Self Care | Payer: Medicare PPO | Attending: Emergency Medicine | Admitting: Emergency Medicine

## 2020-02-27 ENCOUNTER — Emergency Department (HOSPITAL_BASED_OUTPATIENT_CLINIC_OR_DEPARTMENT_OTHER): Payer: Medicare PPO

## 2020-02-27 DIAGNOSIS — I2510 Atherosclerotic heart disease of native coronary artery without angina pectoris: Secondary | ICD-10-CM | POA: Diagnosis not present

## 2020-02-27 DIAGNOSIS — R112 Nausea with vomiting, unspecified: Secondary | ICD-10-CM | POA: Diagnosis not present

## 2020-02-27 DIAGNOSIS — Z91040 Latex allergy status: Secondary | ICD-10-CM | POA: Diagnosis not present

## 2020-02-27 DIAGNOSIS — R519 Headache, unspecified: Secondary | ICD-10-CM

## 2020-02-27 DIAGNOSIS — I10 Essential (primary) hypertension: Secondary | ICD-10-CM | POA: Insufficient documentation

## 2020-02-27 DIAGNOSIS — Z7982 Long term (current) use of aspirin: Secondary | ICD-10-CM | POA: Insufficient documentation

## 2020-02-27 DIAGNOSIS — Z8673 Personal history of transient ischemic attack (TIA), and cerebral infarction without residual deficits: Secondary | ICD-10-CM | POA: Diagnosis not present

## 2020-02-27 DIAGNOSIS — I7090 Unspecified atherosclerosis: Secondary | ICD-10-CM | POA: Diagnosis not present

## 2020-02-27 DIAGNOSIS — E039 Hypothyroidism, unspecified: Secondary | ICD-10-CM | POA: Diagnosis not present

## 2020-02-27 DIAGNOSIS — E119 Type 2 diabetes mellitus without complications: Secondary | ICD-10-CM | POA: Insufficient documentation

## 2020-02-27 MED ORDER — SODIUM CHLORIDE 0.9 % IV BOLUS
500.0000 mL | Freq: Once | INTRAVENOUS | Status: AC
  Administered 2020-02-27: 500 mL via INTRAVENOUS

## 2020-02-27 MED ORDER — METOCLOPRAMIDE HCL 5 MG/ML IJ SOLN
5.0000 mg | Freq: Once | INTRAMUSCULAR | Status: AC
  Administered 2020-02-27: 5 mg via INTRAVENOUS
  Filled 2020-02-27: qty 2

## 2020-02-27 MED ORDER — DIPHENHYDRAMINE HCL 50 MG/ML IJ SOLN
12.5000 mg | Freq: Once | INTRAMUSCULAR | Status: AC
  Administered 2020-02-27: 12.5 mg via INTRAVENOUS
  Filled 2020-02-27: qty 1

## 2020-02-27 MED ORDER — KETOROLAC TROMETHAMINE 30 MG/ML IJ SOLN
15.0000 mg | Freq: Once | INTRAMUSCULAR | Status: AC
  Administered 2020-02-27: 15 mg via INTRAVENOUS
  Filled 2020-02-27: qty 1

## 2020-02-27 NOTE — ED Triage Notes (Signed)
Pt reports severe headache; pt reports N/V and light sensitivity; pt reports having headaches for three weeks after hitting her head but this morning the pain is worse; pt reports burning behind her right eye

## 2020-02-27 NOTE — ED Notes (Signed)
Patient transported to CT 

## 2020-02-27 NOTE — Discharge Instructions (Signed)
The head imaging (CT scan) we did today was reassuringly negative. You were treated with intravenous fluids, anti-nausea medicine, and pain medicine. Your blood pressures were also noted to be elevated today. Please keep your appointment tomorrow (7/20) with your primary care physician to discuss your headaches and blood pressures.

## 2020-02-27 NOTE — ED Notes (Signed)
Pt assisted to BR- ambulatory.

## 2020-02-27 NOTE — ED Notes (Signed)
ED Provider at bedside. 

## 2020-02-27 NOTE — ED Provider Notes (Signed)
[facility] EMERGENCY DEPARTMENT Provider Note   CSN: [id number] Arrival date & time: 02/27/20  0636     History Chief Complaint  Patient presents with  . Migraine    Kathleen Thompson is a 86 y.o. woman with history of DM, HTN, HLD, and hypothyroidism who presents with intractable headache and N/V.  History obtained from the patient and her husband.  Patient reports this all began ~3 weeks ago, which was ~1 week after a mechanical fall from standing height onto the R side of her head and body. At that time, she developed a severe headache a/w N/V. She was seen by her PCP who sent her to this ED where she was seen on 7/2 and evaluated with CMP, U/A, CBC, ESR, troponin, EKG, CXR, and CT head wo contrast. This work-up was largely unremarkable with normal CT head and ESR. She was treated with IVF and migraine cocktail as well as a course of amoxicillin for sinus infection per her PCP. The patient states the treatments brought relief of her symptoms at that time.  Patient reports that not long after returning home from the ED her headache returned and has been persistent, with minimal improvement with tramadol (prescribed for knee pain per PCP). She reports that yesterday (7/18) evening her headache intensified, and she developed an increase in nausea to the point of vomiting. In addition, she noticed a "burning" pain behind her R eye on top of dull pain behind her L eye. She decided to come to the ED this morning because she "had no other choice."  She describes the headache as "all over pressure." Endorses photophobia and "all over weakness." States she has not been able to keep anything down since yesterday and PO intake has been down over the last few days. Denies recent fever or chills, neck stiffness, dizziness, vision changes, chest pain, shortness of breath, abdominal pain, urinary complaints. She denies any recent changes to her medications. Reports a remote history of headaches  however denies any like this.   States her primary care physician is aware that she has been off of her blood pressure medicine for the last month. Reports having come off of losartan due to intolerance however self-discontinued her new BP med owing to intolerance. She has a previously scheduled appointment to see her PCP tomorrow (7/20).     Past Medical History:  Diagnosis Date  . Adenomatous colon polyp 02/2003  . Arthritis   . Blood transfusion   . Cataract   . Diverticulosis   . Esophageal stricture   . Gastroparesis   . GERD (gastroesophageal reflux disease)   . Hyperlipidemia   . Hypertension   . Hypothyroidism   . Internal hemorrhoid   . Iron deficiency anemia   . Osteoporosis   . PONV (postoperative nausea and vomiting)   . PVD (peripheral vascular disease) ([place])   . Stroke [facility])    TIA  . TIA (transient ischemic attack)     Patient Active Problem List   Diagnosis Date Noted  . Diabetes mellitus without complication ([place]) 92/06/9416  . DJD (degenerative joint disease) 12/15/2018  . S/P cervical spinal fusion 01/05/2018  . Protrusion of cervical intervertebral disc 01/05/2018  . Stenosing tenosynovitis of wrist 01/11/2017  . Cervicalgia 01/11/2017  . Trochanteric bursitis, left hip 12/02/2016  . h/o TIA   11/18/2016  . Osteoporosis 06/22/2016  . PCP NOTES >>> 05/21/2015  . Annual physical exam 12/12/2014  . Granuloma RUL resected 03/14/13  12/19/2012  .  ESOPHAGEAL STRICTURE 11/26/2009  . Family history of malignant neoplasm of gastrointestinal tract 09/17/2009  . Cough variant asthma 09/11/2009  . PERSONAL HX COLONIC POLYPS 12/25/2008  . Dyspnea 03/17/2008  . PALPITATIONS, RECURRENT 02/02/2008  . ADENOMATOUS COLONIC POLYP 12/23/2007  . GERD 12/23/2007  . GASTROPARESIS 12/23/2007  . Hypothyroidism 03/12/2007  . Hyperlipemia 03/12/2007  . HTN (hypertension) 03/12/2007  . CAD (coronary artery disease) 03/12/2007  . INTERNAL HEMORRHOIDS 06/01/2006  .  DIVERTICULOSIS, COLON 06/01/2006    Past Surgical History:  Procedure Laterality Date  . ABDOMINAL HYSTERECTOMY    . APPENDECTOMY    . CERVICAL FUSION    . EYE SURGERY Bilateral 08/2016   cataracts  . LEFT HEART CATHETERIZATION WITH CORONARY ANGIOGRAM N/A 12/20/2012   Procedure: LEFT HEART CATHETERIZATION WITH CORONARY ANGIOGRAM;  Surgeon: Peter M Martinique, MD;  Location: [facility] CATH LAB;  Service: Cardiovascular;  Laterality: N/A;  . shoulder surgery     . VIDEO ASSISTED THORACOSCOPY (VATS)/WEDGE RESECTION Right 03/14/2013   Procedure: VIDEO ASSISTED THORACOSCOPY (VATS)/WEDGE RESECTION;  Surgeon: Melrose Nakayama, MD;  Location: [facility];  Service: Thoracic;  Laterality: Right;     OB History   No obstetric history on file.     Family History  Problem Relation Age of Onset  . Diabetes Mother   . Heart attack Mother   . Heart attack Father   . Asthma Father   . Colon cancer Brother        at age 64  . Colon cancer Sister        at age 36  . Diabetes Maternal Aunt   . Diabetes Maternal Uncle   . Heart attack Sister   . Heart attack Brother   . Heart attack Brother   . Heart attack Brother   . Heart attack Brother   . Heart attack Sister   . Breast cancer Neg Hx     Social History   Tobacco Use  . Smoking status: Never Smoker  . Smokeless tobacco: Never Used  Substance Use Topics  . Alcohol use: No  . Drug use: No    Home Medications Prior to Admission medications   Medication Sig Start Date End Date Taking? Authorizing Provider  amoxicillin (AMOXIL) 500 MG capsule Take 1 capsule (500 mg total) by mouth 3 (three) times daily. 02/10/20   Isla Pence, MD  aspirin 81 MG tablet Take 81 mg by mouth daily.    [provider]  CALCIUM PO Take 1 tablet by mouth 2 (two) times daily.    [provider]  clopidogrel (PLAVIX) 75 MG tablet Take 1 tablet (75 mg total) by mouth daily. 11/14/19   Colon Branch, MD  Coenzyme Q10 (CO Q-10 PO) Take 1 tablet by mouth  daily.    [provider]  isosorbide mononitrate (IMDUR) 30 MG 24 hr tablet Take 1 tablet (30 mg total) by mouth daily. 06/03/19   Colon Branch, MD  magnesium 30 MG tablet Take 30 mg by mouth daily.      [provider]  pantoprazole (PROTONIX) 40 MG tablet Take 1 tablet (40 mg total) by mouth daily before breakfast. 07/04/19   Colon Branch, MD  rosuvastatin (CRESTOR) 20 MG tablet Take 1 tablet (20 mg total) by mouth daily. 10/12/19   Colon Branch, MD  SYNTHROID 50 MCG tablet TAKE 1 TABLET BY MOUTH ONCE DAILY BEFORE  BREAKFAST 08/22/19   Colon Branch, MD  traMADol (ULTRAM) 50 MG tablet TAKE 1  TABLET BY MOUTH TWICE DAILY AS NEEDED FOR MODERATE PAIN 12/05/19   Colon Branch, MD  vitamin D, CHOLECALCIFEROL, 400 UNITS tablet Take 400 Units by mouth daily.      [provider]    Allergies    Clindamycin/lincomycin, Cymbalta [duloxetine hcl], Hydrocodone, Rofecoxib, and Latex  Review of Systems   Review of Systems  All other systems reviewed and are negative.  10 systems reviewed and are negative for acute changes, except as noted in the HPI.  Physical Exam Updated Vital Signs BP (!) 149/74 (BP Location: Right Arm)   Pulse 72   Temp 98.5 F (36.9 C) (Oral)   Resp 16   Ht '5\' 2"'$  (1.575 m)   Wt 69.4 kg   SpO2 100%   BMI 27.98 kg/m   Physical Exam Constitutional:      Comments: Tired- and uncomfortable-appearing older woman, sheets pulled up high, room lights dimmed  HENT:     Head: Normocephalic and atraumatic.     Comments: Tenderness to palpation on R temple    Mouth/Throat:     Mouth: Mucous membranes are moist.  Eyes:     Extraocular Movements: Extraocular movements intact.     Conjunctiva/sclera: Conjunctivae normal.     Pupils: Pupils are equal, round, and reactive to light.     Comments: No pain with eye momeents  Cardiovascular:     Rate and Rhythm: Normal rate and regular rhythm.     Pulses: Normal pulses.     Heart sounds: Normal heart sounds.    Pulmonary:     Effort: Pulmonary effort is normal.     Breath sounds: Normal breath sounds.  Abdominal:     General: Bowel sounds are normal.     Palpations: Abdomen is soft.  Musculoskeletal:     Cervical back: Normal range of motion and neck supple.  Skin:    General: Skin is warm and dry.  Neurological:     Mental Status: She is oriented to person, place, and time.     Cranial Nerves: Cranial nerves are intact.     Sensory: Sensation is intact.     Motor: No weakness.     Coordination: Finger-Nose-Finger Test normal.     ED Results / Procedures / Treatments   Labs (all labs ordered are listed, but only abnormal results are displayed) Labs Reviewed - No data to display  EKG None  Radiology CT Head Wo Contrast  Result Date: 02/27/2020 CLINICAL DATA:  Headache, nausea, vomiting, and photosensitivity. EXAM: CT HEAD WITHOUT CONTRAST TECHNIQUE: Contiguous axial images were obtained from the base of the skull through the vertex without intravenous contrast. COMPARISON:  02/10/2020 FINDINGS: Brain: There is no evidence of acute infarct, intracranial hemorrhage, mass, midline shift, or extra-axial fluid collection. The ventricles and sulci are within normal limits for age. Hypodensities in the cerebral white matter bilaterally are similar to the prior study and nonspecific but compatible with mild chronic small vessel ischemic disease. Vascular: Calcified atherosclerosis at the skull base. No hyperdense vessel. Skull: No fracture or suspicious osseous lesion. Sinuses/Orbits: Visualized paranasal sinuses and mastoid air cells are clear. Bilateral cataract extraction. Other: None. IMPRESSION: 1. No evidence of acute intracranial abnormality. 2. Mild chronic small vessel ischemic disease. Electronically Signed   By: Logan Bores M.D.   On: 02/27/2020 08:14    Procedures Procedures (including critical care time)  Medications Ordered in ED Medications  metoCLOPramide (REGLAN) injection 5  mg (5 mg Intravenous Given 02/27/20 0751)  diphenhydrAMINE (BENADRYL) injection 12.5 mg (12.5 mg Intravenous Given 02/27/20 0754)  sodium chloride 0.9 % bolus 500 mL ( Intravenous Stopped 02/27/20 0910)  ketorolac (TORADOL) 30 MG/ML injection 15 mg (15 mg Intravenous Given 02/27/20 6301)    ED Course  I have reviewed the triage vital signs and the nursing notes.  Pertinent labs & imaging results that were available during my care of the patient were reviewed by me and considered in my medical decision making (see chart for details).    MDM Rules/Calculators/A&P                          Kathleen Thompson is a 86 y.o. woman with history of DM, HTN, HLD, and hypothyroidism who presents with intractable headache and N/V.  Patient is non-toxic appearing and mildly hypertensive but otherwise with stable vitals. Exam is non-focal. Low suspicion for infectious process. Thorough work-up from 7/2 ED visit unremarkable so will not repeat. However, given the patient's acute worsening and recent fall will repeat CT head wo contrast to evaluate for possible smoldering subdural or other intracranial process. Will treat with migraine cocktail and IVF.  8:40 AM No evidence of acute intracranial abnormality. Reevaluated patient after metoclopramide, diphenhydramine, and initiation of IVF, and she is still complaining of intense headache. Will treat with 15 mg Toradol and reassess.   Patient's pain is much improved now. She was discharged home with return precautions and recommendation to keep her appointment with her PCP tomorrow (7/20) to discuss this visit as well as blood pressure management.   Final Clinical Impression(s) / ED Diagnoses Final diagnoses:  Bad headache    Rx / DC Orders ED Discharge Orders    None       Alexandria Lodge, MD 02/27/20 1533    Malvin Johns, MD 03/01/20 [phone]

## 2020-02-28 ENCOUNTER — Ambulatory Visit: Payer: Medicare PPO | Admitting: Internal Medicine

## 2020-02-28 ENCOUNTER — Encounter: Payer: Self-pay | Admitting: Internal Medicine

## 2020-02-28 VITALS — BP 160/88 | HR 75 | Temp 98.1°F | Resp 18 | Ht 62.0 in | Wt 154.0 lb

## 2020-02-28 DIAGNOSIS — R1110 Vomiting, unspecified: Secondary | ICD-10-CM

## 2020-02-28 DIAGNOSIS — R519 Headache, unspecified: Secondary | ICD-10-CM | POA: Diagnosis not present

## 2020-02-28 DIAGNOSIS — I10 Essential (primary) hypertension: Secondary | ICD-10-CM

## 2020-02-28 MED ORDER — TOPIRAMATE 25 MG PO TABS
25.0000 mg | ORAL_TABLET | Freq: Two times a day (BID) | ORAL | 0 refills | Status: DC
Start: 2020-02-28 — End: 2020-03-01

## 2020-02-28 MED ORDER — ONDANSETRON HCL 8 MG PO TABS
8.0000 mg | ORAL_TABLET | Freq: Three times a day (TID) | ORAL | 0 refills | Status: DC | PRN
Start: 2020-02-28 — End: 2020-04-04

## 2020-02-28 MED ORDER — LOSARTAN POTASSIUM-HCTZ 100-12.5 MG PO TABS: 0.5000 | ORAL_TABLET | Freq: Every day | ORAL | 0 refills | Status: DC

## 2020-02-28 MED ORDER — TRAMADOL HCL 50 MG PO TABS: 50.0000 mg | ORAL_TABLET | Freq: Two times a day (BID) | ORAL | 0 refills | Status: DC | PRN

## 2020-02-28 MED ORDER — PREDNISONE 10 MG PO TABS: ORAL_TABLET | ORAL | 0 refills | Status: DC

## 2020-02-28 NOTE — Patient Instructions (Addendum)
   Start taking prednisone as recommended  Start Topamax 25 mg: 1 tablet daily for 1 week, then 1 tablet twice a day  Continue Ultram as needed  Zofran (ondansetron) as needed for nausea  Rest and fluids  We are referring to neurology  If you have fever, chills, rash, symptoms are intense please call the clinic immediately

## 2020-02-28 NOTE — Progress Notes (Signed)
Pre visit review using our clinic review tool, if applicable. No additional management support is needed unless otherwise documented below in the visit note. 

## 2020-02-28 NOTE — Progress Notes (Signed)
Subjective:    Patient ID: Kathleen Thompson, female    DOB: [birth year], 86 y.o.   MRN: [id number]  DOS:  02/28/2020 Type of visit - description: ER follow-up  Since the last visit 02/10/2020, was seen at the ER twice for headache. ER 02/10/2020: Work-up included: CMP, CBC, sed rate, troponin, urinalysis CT head: No acute, mild ethmoid sinus mucosal thickening Was prescribed amoxicillin for presumed sinusitis.  Went to the ED again yesterday: Shortly after the previous ER evaluation, the headaches resurfaced and has been persistent reason why she went again to the emergency room. BP in the ER was 149/74.  After yesterday's ED visit, she had a brief relief and today she continued with a headache. Describe the headache as steady with spikes throughout the day.  When the headache started it was frontal, now is almost exclusively on the right side of the face and behind the right eye. + Associated nausea and vomiting.  Some photophobia. Denies nasal congestion or eye tearing.   Review of Systems Denies fever chills No chest pain or difficulty breathing No dizziness, diplopia or slurred speech.  Past Medical History:  Diagnosis Date  . Adenomatous colon polyp 02/2003  . Arthritis   . Blood transfusion   . Cataract   . Diverticulosis   . Esophageal stricture   . Gastroparesis   . GERD (gastroesophageal reflux disease)   . Hyperlipidemia   . Hypertension   . Hypothyroidism   . Internal hemorrhoid   . Iron deficiency anemia   . Osteoporosis   . PONV (postoperative nausea and vomiting)   . PVD (peripheral vascular disease) ([place])   . Stroke [facility])    TIA  . TIA (transient ischemic attack)     Past Surgical History:  Procedure Laterality Date  . ABDOMINAL HYSTERECTOMY    . APPENDECTOMY    . CERVICAL FUSION    . EYE SURGERY Bilateral 08/2016   cataracts  . LEFT HEART CATHETERIZATION WITH CORONARY ANGIOGRAM N/A 12/20/2012   Procedure: LEFT HEART CATHETERIZATION WITH CORONARY  ANGIOGRAM;  Surgeon: Peter M Martinique, MD;  Location: [facility] CATH LAB;  Service: Cardiovascular;  Laterality: N/A;  . shoulder surgery     . VIDEO ASSISTED THORACOSCOPY (VATS)/WEDGE RESECTION Right 03/14/2013   Procedure: VIDEO ASSISTED THORACOSCOPY (VATS)/WEDGE RESECTION;  Surgeon: Melrose Nakayama, MD;  Location: [facility];  Service: Thoracic;  Laterality: Right;    Allergies as of 02/28/2020      Reactions   Clindamycin/lincomycin Rash   Cymbalta [duloxetine Hcl]    Jittery, nervous, could not sleep   Hydrocodone Hives   Rofecoxib    hives   Latex Rash   Pt states that latex causes blisters to skin      Medication List       Accurate as of February 28, 2020 11:59 PM. If you have any questions, ask your nurse or doctor.        STOP taking these medications   amoxicillin 500 MG capsule Commonly known as: AMOXIL Stopped by: Kathlene November, MD     TAKE these medications   aspirin 81 MG tablet Take 81 mg by mouth daily.   CALCIUM PO Take 1 tablet by mouth 2 (two) times daily.   clopidogrel 75 MG tablet Commonly known as: PLAVIX Take 1 tablet (75 mg total) by mouth daily.   CO Q-10 PO Take 1 tablet by mouth daily.   isosorbide mononitrate 30 MG 24 hr tablet Commonly known as: IMDUR Take 1 tablet (30  mg total) by mouth daily.   losartan-hydrochlorothiazide 100-12.5 MG tablet Commonly known as: HYZAAR Take 0.5 tablets by mouth daily. Started by: Kathlene November, MD   magnesium 30 MG tablet Take 30 mg by mouth daily.   ondansetron 8 MG tablet Commonly known as: Zofran Take 1 tablet (8 mg total) by mouth every 8 (eight) hours as needed for nausea or vomiting. Started by: Kathlene November, MD   pantoprazole 40 MG tablet Commonly known as: PROTONIX Take 1 tablet (40 mg total) by mouth daily before breakfast.   predniSONE 10 MG tablet Commonly known as: DELTASONE 4 tablets x 2 days, 3 tabs x 2 days, 2 tabs x 2 days, 1 tab x 2 days Started by: Kathlene November, MD   rosuvastatin 20 MG  tablet Commonly known as: CRESTOR Take 1 tablet (20 mg total) by mouth daily.   Synthroid 50 MCG tablet Generic drug: levothyroxine TAKE 1 TABLET BY MOUTH ONCE DAILY BEFORE  BREAKFAST   topiramate 25 MG tablet Commonly known as: Topamax Take 1 tablet (25 mg total) by mouth 2 (two) times daily. Started by: Kathlene November, MD   traMADol 50 MG tablet Commonly known as: ULTRAM Take 1 tablet (50 mg total) by mouth every 12 (twelve) hours as needed. What changed: See the new instructions. Changed by: Kathlene November, MD   vitamin D (CHOLECALCIFEROL) 400 units tablet Take 400 Units by mouth daily.          Objective:   Physical Exam BP (!) 160/88 (BP Location: Left Arm, Patient Position: Sitting, Cuff Size: Small)   Pulse 75   Temp 98.1 F (36.7 C) (Oral)   Resp 18   Ht 5\' 2"  (1.575 m)   Wt 154 lb (69.9 kg)   SpO2 96%   BMI 28.17 kg/m  General:   Well developed, NAD, BMI noted. HEENT:  Normocephalic . Face symmetric, atraumatic. No rash. EOMI. Conjunctiva is slightly injected bilaterally without tearing. Lungs:  CTA B Normal respiratory effort, no intercostal retractions, no accessory muscle use. Heart: RRR,  no murmur.  Lower extremities: no pretibial edema bilaterally  Skin: Not pale. Not jaundice Neurologic:  alert & oriented X3.  Speech normal, gait appropriate for age and unassisted Motor and DTR symmetric Psych--  Cognition and judgment appear intact.  Cooperative with normal attention span and concentration.  Behavior appropriate. No anxious or depressed appearing.      Assessment      Assessment DM: DX 05-2019, A1c 6.6.  HTN Hypothyroidism Hyperlipidemia CV: --CAD cath 12/2012 single-vessel CAD,rx CV RF control, on Plavix, imdur --TIA remotely , apparently amaurosis fugax, was a started on on plavix per PCP, no further sx since. --On ASA and Plavix: Discuss 11/17/2016 Osteoporosis -  DEXAs @ [facility], T score -3.9 (12/2015).Prolia #1:  07/2016 MSK: Dr  Lorin Mercy --DJD- s/p neck and shoulder surgeries ; + DJD cervical spine --knee pain: on tramadol rx per pcp GI: GED, gastroparesis, diverticulosis, esophageal stricture H/o Iron deficiency anemia H/o granuloma RUL, resected 03-14-13  PLAN: Headache: Headache started suddenly approximately 02/10/2020 after 20 years with no headaches. Has been to the ER twice. Sed rate normal, CT head x2 nonacute, had amoxicillin for presumed possible sinusitis with no help. At this point the headache is steady but spikes throughout the day on and off, and is has localized at the right side of the face and behind the R eye. DDx is large, given pattern suspect cluster headache vs migraines vs secondary cluster headache vs trigeminal neuralgia vs  others. Plan: Neurology referral urgent. MRI/MRA Topamax 25 mg daily for 1 week then B.I.D. Round of prednisone Refill Ultram. Zofran HTN: Recently Hyzaar was discontinued due to dizziness, started Maxide but self DC'd because feeling tired.  We agreed to restart Hyzaar, half tablet daily, increase to 1 tablet if needed.    Today, I spent 45 minutes with the patient, assessing her symptoms, reviewing 2 ER visits and work-up, explaining the patient the DDx and treatment. Also coordinated her GERD, she will be seen by neurology soon.  This visit occurred during the SARS-CoV-2 public health emergency.  Safety protocols were in place, including screening questions prior to the visit, additional usage of staff PPE, and extensive cleaning of exam room while observing appropriate contact time as indicated for disinfecting solutions.

## 2020-02-29 NOTE — Assessment & Plan Note (Signed)
Headache: Headache started suddenly approximately 02/10/2020 after 20 years with no headaches. Has been to the ER twice. Sed rate normal, CT head x2 nonacute, had amoxicillin for presumed possible sinusitis with no help. At this point the headache is steady but spikes throughout the day on and off, and is has localized at the right side of the face and behind the R eye. DDx is large, given pattern suspect cluster headache vs migraines vs secondary cluster headache vs trigeminal neuralgia vs others. Plan: Neurology referral urgent. MRI/MRA Topamax 25 mg daily for 1 week then B.I.D. Round of prednisone Refill Ultram. Zofran HTN: Recently Hyzaar was discontinued due to dizziness, started Maxide but self DC'd because feeling tired.  We agreed to restart Hyzaar, half tablet daily, increase to 1 tablet if needed.

## 2020-03-01 ENCOUNTER — Ambulatory Visit: Payer: Medicare PPO | Admitting: Neurology

## 2020-03-01 ENCOUNTER — Encounter: Payer: Self-pay | Admitting: Neurology

## 2020-03-01 ENCOUNTER — Other Ambulatory Visit: Payer: Self-pay

## 2020-03-01 VITALS — BP 152/81 | HR 69 | Ht 62.0 in | Wt 152.5 lb

## 2020-03-01 DIAGNOSIS — R519 Headache, unspecified: Secondary | ICD-10-CM | POA: Diagnosis not present

## 2020-03-01 DIAGNOSIS — Z9181 History of falling: Secondary | ICD-10-CM

## 2020-03-01 DIAGNOSIS — G43709 Chronic migraine without aura, not intractable, without status migrainosus: Secondary | ICD-10-CM

## 2020-03-01 DIAGNOSIS — IMO0002 Reserved for concepts with insufficient information to code with codable children: Secondary | ICD-10-CM

## 2020-03-01 MED ORDER — BUTALBITAL-APAP-CAFFEINE 50-325-40 MG PO TABS
1.0000 | ORAL_TABLET | Freq: Four times a day (QID) | ORAL | 5 refills | Status: DC | PRN
Start: 2020-03-01 — End: 2020-04-04

## 2020-03-01 MED ORDER — TOPIRAMATE 25 MG PO TABS: 25.0000 mg | ORAL_TABLET | Freq: Two times a day (BID) | ORAL | 11 refills | Status: DC

## 2020-03-01 NOTE — Progress Notes (Signed)
HISTORICAL  Kathleen Thompson is a 86 year old female, seen in request by her primary care physician Dr. Larose Kells, [facility], for evaluation of frequent headaches.  Initial evaluation was on March 01, 2020.  I reviewed and summarized the referring note.  Past medical history Hypertension Hyperlipidemia Hypothyroidism  Patient has been active all her life, still very active, enjoying inside and outside yard work, in May 2021, she felt missing steps in her flower bed, went down to her knees, she bumped her forehead on the grassy ground, no loss of consciousness,  Couple weeks later, she began to experience frequent headaches, woke up 1 night had severe left retro-orbital area headaches, movement made it worse,  Since then, she began to have frequent headaches, presented to emergency room twice, July 2, July 19, she was treated with tramadol, Reglan and emergency room, which did help her headache, she also complains of blurry vision during intense headache, was seen by ophthalmology recently, was reported normal  She reported long history of migraine headaches, frequent headache in her 36s, she could remember that her headache was so severe, sometimes spreading to her jaw, teeth, most often lateralize, movement made it worse,  She has not had her headache for a while, now she mostly has lateralized, left more than right side headache, throbbing, light noise sensitivity, nausea, it happened on a daily basis, today she complains of few days history of moderate headache,  She was giving prednisone tapering since July 2021, starting from 40 mg, 10 mg decrement, which seems to help her headache some, she was also started on Topamax 25 mg twice a day, which was also helpful,   Laboratory evaluation in 2021, normal ESR 13, troponin, CMP, glucose was mildly elevated 113, CBC, hemoglobin of 14.6, TSH 1.82, A1c 6.3, I personally reviewed CT head without contrast on February 09, 2018 2021, no acute intracranial  abnormality, mild supratentorium small vessel disease.  REVIEW OF SYSTEMS: Full 14 system review of systems performed and notable only for as above All other review of systems were negative.  ALLERGIES: Allergies  Allergen Reactions  . Clindamycin/Lincomycin Rash  . Cymbalta [Duloxetine Hcl]     Jittery, nervous, could not sleep  . Hydrocodone Hives  . Rofecoxib     hives  . Latex Rash    Pt states that latex causes blisters to skin    HOME MEDICATIONS: Current Outpatient Medications  Medication Sig Dispense Refill  . aspirin 81 MG tablet Take 81 mg by mouth daily.    Marland Kitchen CALCIUM PO Take 1 tablet by mouth 2 (two) times daily.    . clopidogrel (PLAVIX) 75 MG tablet Take 1 tablet (75 mg total) by mouth daily. 90 tablet 3  . Coenzyme Q10 (CO Q-10 PO) Take 1 tablet by mouth daily.    . isosorbide mononitrate (IMDUR) 30 MG 24 hr tablet Take 1 tablet (30 mg total) by mouth daily. 90 tablet 2  . losartan-hydrochlorothiazide (HYZAAR) 100-12.5 MG tablet Take 0.5 tablets by mouth daily. 90 tablet 0  . magnesium 30 MG tablet Take 30 mg by mouth daily.      . ondansetron (ZOFRAN) 8 MG tablet Take 1 tablet (8 mg total) by mouth every 8 (eight) hours as needed for nausea or vomiting. 21 tablet 0  . pantoprazole (PROTONIX) 40 MG tablet Take 1 tablet (40 mg total) by mouth daily before breakfast. 90 tablet 3  . predniSONE (DELTASONE) 10 MG tablet 4 tablets x 2 days, 3 tabs x 2 days,  2 tabs x 2 days, 1 tab x 2 days 20 tablet 0  . rosuvastatin (CRESTOR) 20 MG tablet Take 1 tablet (20 mg total) by mouth daily. 90 tablet 2  . SYNTHROID 50 MCG tablet TAKE 1 TABLET BY MOUTH ONCE DAILY BEFORE  BREAKFAST 90 tablet 1  . topiramate (TOPAMAX) 25 MG tablet Take 1 tablet (25 mg total) by mouth 2 (two) times daily. 60 tablet 0  . traMADol (ULTRAM) 50 MG tablet Take 1 tablet (50 mg total) by mouth every 12 (twelve) hours as needed. 180 tablet 0  . vitamin D, CHOLECALCIFEROL, 400 UNITS tablet Take 400 Units by  mouth daily.       No current facility-administered medications for this visit.    PAST MEDICAL HISTORY: Past Medical History:  Diagnosis Date  . Adenomatous colon polyp 02/2003  . Arthritis   . Blood transfusion   . Cataract   . Diverticulosis   . Esophageal stricture   . Gastroparesis   . GERD (gastroesophageal reflux disease)   . Headache   . Hyperlipidemia   . Hypertension   . Hypothyroidism   . Internal hemorrhoid   . Iron deficiency anemia   . Osteoporosis   . PONV (postoperative nausea and vomiting)   . PVD (peripheral vascular disease) ([place])   . Stroke [facility])    TIA  . TIA (transient ischemic attack)     PAST SURGICAL HISTORY: Past Surgical History:  Procedure Laterality Date  . ABDOMINAL HYSTERECTOMY    . APPENDECTOMY    . CERVICAL FUSION    . EYE SURGERY Bilateral 08/2016   cataracts  . LEFT HEART CATHETERIZATION WITH CORONARY ANGIOGRAM N/A 12/20/2012   Procedure: LEFT HEART CATHETERIZATION WITH CORONARY ANGIOGRAM;  Surgeon: Peter M Martinique, MD;  Location[facility] CATH LAB;  Service: Cardiovascular;  Laterality: N/A;  . shoulder surgery     . VIDEO ASSISTED THORACOSCOPY (VATS)/WEDGE RESECTION Right 03/14/2013   Procedure: VIDEO ASSISTED THORACOSCOPY (VATS)/WEDGE RESECTION;  Surgeon: Melrose Nakayama, MD;  Location: [place];  Service: Thoracic;  Laterality: Right;    FAMILY HISTORY: Family History  Problem Relation Age of Onset  . Diabetes Mother   . Heart attack Mother   . Heart attack Father   . Asthma Father   . Colon cancer Brother        at age 69  . Colon cancer Sister        at age 77  . Diabetes Maternal Aunt   . Diabetes Maternal Uncle   . Heart attack Sister   . Heart attack Brother   . Heart attack Brother   . Heart attack Brother   . Heart attack Brother   . Heart attack Sister   . Breast cancer Neg Hx     SOCIAL HISTORY: Social History   Socioeconomic History  . Marital status: Married    Spouse name: Not on file  . Number of  children: 2  . Years of education: 11th grade  . Highest education level: Not on file  Occupational History  . Occupation: Retired-[employer]     Employer: RETIRED  Tobacco Use  . Smoking status: Never Smoker  . Smokeless tobacco: Never Used  Substance and Sexual Activity  . Alcohol use: No  . Drug use: No  . Sexual activity: Yes  Other Topics Concern  . Not on file  Social History Narrative   Household-- pt and wife   2 boys , one in [place], on in [place]  Only surviving sibling out of seven.   One cup coffee with 1/2 caffeine.   Right-handed.   Social Determinants of Health   Financial Resource Strain:   . Difficulty of Paying Living Expenses:   Food Insecurity:   . Worried About Charity fundraiser in the Last Year:   . Arboriculturist in the Last Year:   Transportation Needs:   . Film/video editor (Medical):   Marland Kitchen Lack of Transportation (Non-Medical):   Physical Activity:   . Days of Exercise per Week:   . Minutes of Exercise per Session:   Stress:   . Feeling of Stress :   Social Connections:   . Frequency of Communication with Friends and Family:   . Frequency of Social Gatherings with Friends and Family:   . Attends Religious Services:   . Active Member of Clubs or Organizations:   . Attends Archivist Meetings:   Marland Kitchen Marital Status:   Intimate Partner Violence:   . Fear of Current or Ex-Partner:   . Emotionally Abused:   Marland Kitchen Physically Abused:   . Sexually Abused:      PHYSICAL EXAM   Vitals:   03/01/20 0940  BP: (!) 152/81  Pulse: 69  Weight: 152 lb 8 oz (69.2 kg)  Height: _0  (1.575 m)   Not recorded     Body mass index is 27.89 kg/m.  PHYSICAL EXAMNIATION:  Gen: NAD, conversant, well nourised, well groomed                     Cardiovascular: Regular rate rhythm, no peripheral edema, warm, nontender. Eyes: Conjunctivae clear without exudates or hemorrhage Neck: Supple, no carotid bruits. Pulmonary: Clear to auscultation bilaterally    NEUROLOGICAL EXAM:  MENTAL STATUS: Speech:    Speech is normal; fluent and spontaneous with normal comprehension.  Cognition:     Orientation to time, place and person     Normal recent and remote memory     Normal Attention span and concentration     Normal Language, naming, repeating,spontaneous speech     Fund of knowledge   CRANIAL NERVES: CN II: Visual fields are full to confrontation. Pupils are round equal and briskly reactive to light. CN III, IV, VI: extraocular movement are normal. No ptosis. CN V: Facial sensation is intact to light touch CN VII: Face is symmetric with normal eye closure  CN VIII: Hearing is normal to causal conversation. CN IX, X: Phonation is normal. Small jaw, narrow oropharyngeal space CN XI: Head turning and shoulder shrug are intact  MOTOR: There is no pronator drift of out-stretched arms. Muscle bulk and tone are normal. Muscle strength is normal.  REFLEXES: Reflexes are 2+ and symmetric at the biceps, triceps, knees, and ankles. Plantar responses are flexor.  SENSORY: Intact to light touch, pinprick and vibratory sensation are intact in fingers and toes.  COORDINATION: There is no trunk or limb dysmetria noted.  GAIT/STANCE: Posture is normal. Gait is steady with normal steps, base, arm swing, and turning. Heel and toe walking are normal. Tandem gait is normal.  Romberg is absent.   DIAGNOSTIC DATA (LABS, IMAGING, TESTING) - I reviewed patient records, labs, notes, testing and imaging myself where available.   ASSESSMENT AND PLAN  SUNAINA FERRANDO is a 86 y.o. female   New onset persistent daily headache History of fall in May 2021 History of chronic migraine  Her recurrent headache has a lot of migraine features, but  such severe frequent headache following a fall injury  Will proceed with CT angiogram of head and neck to rule out carotid artery dissection  ESR C-reactive protein to rule out temporal arteritis  Nerve block  today  Continue Topamax 25 mg 2 tablets twice a day as preventive medications  Fioricet, Zofran, even tramadol as needed for severe headaches  Return to clinic with nurse practitioner Sarah in 4 weeks   Marcial Pacas, M.D. Ph.D.  [facility] [address] [place], [place] [address] Ph: [phone] Fax: [phone]  CC:  [address] [place],  [place] [address]

## 2020-03-05 NOTE — Addendum Note (Signed)
Addended byDamita Dunnings D on: 03/05/2020 07:41 AM   Modules accepted: Orders

## 2020-03-05 NOTE — Progress Notes (Signed)
   History: Frequent headaches since June 2021, failed home remedy    Bilateral occipital and trigeminal nerve block; trigger point injection of bilateral cervical and upper trapezius muscles for intractable headache  Bupivacaine 0.5% was injected on the scalp bilaterally at several locations:  -On the occipital area of the head, 3 injections each side, 0.5 cc per injection at the midpoint between the mastoid process and the occipital protuberance. 2 other injections were done one finger breadth from the initial injection, one at a 10 o'clock position and the other at a 2 o'clock position.  -2 injections of 0.5 cc were done in the temporal regions, 2 fingerbreadths above the tragus of the ear, with the second injection one fingerbreadth posteriorly to the first.  -2 injections were done on the brow, 1 in the medial brow and one over the supraorbital nerve notch, with 0.1 cc for each injection  -1 injection each side of 0.5 cc was done anterior to the tragus of the ear for a trigeminal ganglion injection  -0.5 cc was injected into bilateral upper trapezius and bilateral upper cervical paraspinals   The patient tolerated the injections well, no complications of the procedure were noted. Injections were made with a 27-gauge needle.

## 2020-03-06 ENCOUNTER — Telehealth: Payer: Self-pay | Admitting: Neurology

## 2020-03-06 NOTE — Telephone Encounter (Signed)
[employer] pending  

## 2020-03-10 ENCOUNTER — Ambulatory Visit (HOSPITAL_BASED_OUTPATIENT_CLINIC_OR_DEPARTMENT_OTHER): Payer: Medicare PPO

## 2020-03-10 ENCOUNTER — Other Ambulatory Visit (HOSPITAL_BASED_OUTPATIENT_CLINIC_OR_DEPARTMENT_OTHER): Payer: Medicare PPO

## 2020-03-12 NOTE — Telephone Encounter (Signed)
Mcarthur Rossetti Josem Kaufmann: [phone] & [phone] (exp. 03/06/20 to 04/05/20) order sent to GI. They will reach out to the patient to schedule.

## 2020-03-26 ENCOUNTER — Telehealth: Payer: Self-pay | Admitting: Neurology

## 2020-03-26 ENCOUNTER — Ambulatory Visit
Admission: RE | Admit: 2020-03-26 | Discharge: 2020-03-26 | Disposition: A | Discharge status: Home / Self Care | Payer: Medicare PPO | Source: Ambulatory Visit | Attending: Neurology | Admitting: Neurology

## 2020-03-26 DIAGNOSIS — IMO0002 Reserved for concepts with insufficient information to code with codable children: Secondary | ICD-10-CM

## 2020-03-26 DIAGNOSIS — Z9181 History of falling: Secondary | ICD-10-CM

## 2020-03-26 DIAGNOSIS — G43709 Chronic migraine without aura, not intractable, without status migrainosus: Secondary | ICD-10-CM

## 2020-03-26 DIAGNOSIS — R519 Headache, unspecified: Secondary | ICD-10-CM

## 2020-03-26 MED ORDER — IOPAMIDOL (ISOVUE-370) INJECTION 76%
75.0000 mL | Freq: Once | INTRAVENOUS | Status: AC | PRN
  Administered 2020-03-26: 75 mL via INTRAVENOUS

## 2020-03-26 NOTE — Telephone Encounter (Signed)
-----   Message from Desmond Lope, RN sent at 03/26/2020  3:27 PM EDT -----  ----- Message ----- From: Interface, Rad Results In Sent: 03/26/2020   3:10 PM EDT To: Marcial Pacas, MD

## 2020-03-26 NOTE — Telephone Encounter (Signed)
I called the patient concerning the report of the CT angiogram, evidence of mild atherosclerotic disease, no large vessel stenosis of significance.  No acute changes on CT of the head.   CTA head and neck 03/26/20:  IMPRESSION: 1. Mild atherosclerosis in the head and neck without large vessel occlusion, significant stenosis, or aneurysm. 2. No evidence of acute intracranial abnormality. 3. Mild chronic small vessel ischemic disease. 4. Aortic Atherosclerosis (ICD10-I70.0).

## 2020-03-29 ENCOUNTER — Encounter: Payer: Self-pay | Admitting: Internal Medicine

## 2020-04-04 ENCOUNTER — Ambulatory Visit (INDEPENDENT_AMBULATORY_CARE_PROVIDER_SITE_OTHER): Payer: Medicare PPO | Admitting: *Deleted

## 2020-04-04 ENCOUNTER — Encounter: Payer: Self-pay | Admitting: Internal Medicine

## 2020-04-04 ENCOUNTER — Ambulatory Visit: Payer: Medicare PPO | Admitting: Internal Medicine

## 2020-04-04 ENCOUNTER — Other Ambulatory Visit: Payer: Self-pay

## 2020-04-04 VITALS — BP 126/83 | HR 75 | Temp 97.7°F | Resp 16 | Ht 62.0 in | Wt 151.1 lb

## 2020-04-04 DIAGNOSIS — I10 Essential (primary) hypertension: Secondary | ICD-10-CM

## 2020-04-04 DIAGNOSIS — M810 Age-related osteoporosis without current pathological fracture: Secondary | ICD-10-CM

## 2020-04-04 DIAGNOSIS — R519 Headache, unspecified: Secondary | ICD-10-CM | POA: Diagnosis not present

## 2020-04-04 MED ORDER — ONDANSETRON HCL 8 MG PO TABS
8.0000 mg | ORAL_TABLET | Freq: Three times a day (TID) | ORAL | 0 refills | Status: DC | PRN
Start: 2020-04-04 — End: 2022-06-11

## 2020-04-04 MED ORDER — DENOSUMAB 60 MG/ML ~~LOC~~ SOSY
60.0000 mg | PREFILLED_SYRINGE | Freq: Once | SUBCUTANEOUS | Status: AC
  Administered 2020-04-04: 60 mg via SUBCUTANEOUS

## 2020-04-04 NOTE — Patient Instructions (Addendum)
Please go downstairs to schedule your bone density test.   Per our records you are due for an eye exam. Please contact your eye doctor to schedule an appointment. Please have them send copies of your office visit notes to [place]. Our fax number is [phone].   Check the  blood pressure  BP GOAL is between 110/65 and  135/85. If it is consistently higher or lower, let me know    GO TO THE FRONT DESK, PLEASE SCHEDULE YOUR APPOINTMENTS Come back for a physical exam in 3 to 4 months

## 2020-04-04 NOTE — Progress Notes (Signed)
Subjective:    Patient ID: Kathleen Thompson, female    DOB: 04/23/1934, 86 y.o.   MRN: [id number]  DOS:  04/04/2020 Type of visit - description: Follow-up Doing better. HTN: Ambulatory BPs are good. Headaches: Saw neurology, note reviewed, much improved.  Have a mild headache daily and occasionally itspikes but she treats spikes with tramadol and Zofran.    Review of Systems See above   Past Medical History:  Diagnosis Date  . Adenomatous colon polyp 02/2003  . Arthritis   . Blood transfusion   . Cataract   . Diverticulosis   . Esophageal stricture   . Gastroparesis   . GERD (gastroesophageal reflux disease)   . Headache   . Hyperlipidemia   . Hypertension   . Hypothyroidism   . Internal hemorrhoid   . Iron deficiency anemia   . Osteoporosis   . PONV (postoperative nausea and vomiting)   . PVD (peripheral vascular disease) ([place])   . Stroke [facility])    TIA  . TIA (transient ischemic attack)     Past Surgical History:  Procedure Laterality Date  . ABDOMINAL HYSTERECTOMY    . APPENDECTOMY    . CERVICAL FUSION    . EYE SURGERY Bilateral 08/2016   cataracts  . LEFT HEART CATHETERIZATION WITH CORONARY ANGIOGRAM N/A 12/20/2012   Procedure: LEFT HEART CATHETERIZATION WITH CORONARY ANGIOGRAM;  Surgeon: Peter M Martinique, MD;  Location: [facility] CATH LAB;  Service: Cardiovascular;  Laterality: N/A;  . shoulder surgery     . VIDEO ASSISTED THORACOSCOPY (VATS)/WEDGE RESECTION Right 03/14/2013   Procedure: VIDEO ASSISTED THORACOSCOPY (VATS)/WEDGE RESECTION;  Surgeon: Melrose Nakayama, MD;  Location: [place];  Service: Thoracic;  Laterality: Right;    Allergies as of 04/04/2020      Reactions   Clindamycin/lincomycin Rash   Cymbalta [duloxetine Hcl]    Jittery, nervous, could not sleep   Hydrocodone Hives   Rofecoxib    hives   Latex Rash   Pt states that latex causes blisters to skin      Medication List       Accurate as of April 04, 2020 11:59 PM. If you have any  questions, ask your nurse or doctor.        STOP taking these medications   butalbital-acetaminophen-caffeine 50-325-40 MG tablet Commonly known as: FIORICET Stopped by: Kathlene November, MD   topiramate 25 MG tablet Commonly known as: Topamax Stopped by: Kathlene November, MD     TAKE these medications   aspirin 81 MG tablet Take 81 mg by mouth daily.   CALCIUM PO Take 1 tablet by mouth 2 (two) times daily.   clopidogrel 75 MG tablet Commonly known as: PLAVIX Take 1 tablet (75 mg total) by mouth daily.   CO Q-10 PO Take 1 tablet by mouth daily.   isosorbide mononitrate 30 MG 24 hr tablet Commonly known as: IMDUR Take 1 tablet (30 mg total) by mouth daily.   losartan-hydrochlorothiazide 100-12.5 MG tablet Commonly known as: HYZAAR Take 0.5 tablets by mouth daily.   magnesium 30 MG tablet Take 30 mg by mouth daily.   ondansetron 8 MG tablet Commonly known as: Zofran Take 1 tablet (8 mg total) by mouth every 8 (eight) hours as needed for nausea or vomiting.   pantoprazole 40 MG tablet Commonly known as: PROTONIX Take 1 tablet (40 mg total) by mouth daily before breakfast.   predniSONE 10 MG tablet Commonly known as: DELTASONE 4 tablets x 2 days, 3 tabs x 2  days, 2 tabs x 2 days, 1 tab x 2 days   rosuvastatin 20 MG tablet Commonly known as: CRESTOR Take 1 tablet (20 mg total) by mouth daily.   Synthroid 50 MCG tablet Generic drug: levothyroxine TAKE 1 TABLET BY MOUTH ONCE DAILY BEFORE  BREAKFAST   traMADol 50 MG tablet Commonly known as: ULTRAM Take 1 tablet (50 mg total) by mouth every 12 (twelve) hours as needed.   vitamin D (CHOLECALCIFEROL) 400 units tablet Take 400 Units by mouth daily.          Objective:   Physical Exam BP 126/83 (BP Location: Left Arm, Patient Position: Sitting, Cuff Size: Small)   Pulse 75   Temp 97.7 F (36.5 C) (Oral)   Resp 16   Ht 5\' 2"  (1.575 m)   Wt 151 lb 2 oz (68.5 kg)   SpO2 98%   BMI 27.64 kg/m  General:   Well  developed, NAD, BMI noted. HEENT:  Normocephalic . Face symmetric, atraumatic Lungs:  CTA B Normal respiratory effort, no intercostal retractions, no accessory muscle use. Heart: RRR,  no murmur.  Lower extremities: + Periankle edema bilateral Skin: Not pale. Not jaundice Neurologic:  alert & oriented X3.  Speech normal, gait appropriate for age and unassisted Psych--  Cognition and judgment appear intact.  Cooperative with normal attention span and concentration.  Behavior appropriate. No anxious or depressed appearing.      Assessment     Assessment DM: DX 05-2019, A1c 6.6.  HTN Hypothyroidism Hyperlipidemia CV: --CAD cath 12/2012 single-vessel CAD,rx CV RF control, on Plavix, imdur --TIA remotely , apparently amaurosis fugax, was a started on on plavix per PCP, no further sx since. --On ASA and Plavix: Discuss 11/17/2016 Osteoporosis -  DEXAs @ [facility], T score -3.9 (12/2015).Prolia #1:  07/2016 MSK: Dr Lorin Mercy --DJD- s/p neck and shoulder surgeries ; + DJD cervical spine --knee pain: on tramadol rx per pcp GI: GED, gastroparesis, diverticulosis, esophageal stricture H/o Iron deficiency anemia H/o granuloma RUL, resected 03-14-13  PLAN: HTN: Reports good compliance with Hyzaar, ambulatory BPs very good.  BP today is normal, no change Headache: Saw neurology 03/01/2020, they recommended to continue Topamax, also Fioricet/Zofran/tramadol as needed.  Had nerve block (helped) Had a CT angiogram head and neck: mild atherosclerosis head and neck with no large vessel occlusion. Self d/c Fioricet due to insomnia, also d/c  Topamax due to somnolence.Cont treat w/ tramadol- Zofran prn. Offered to no other preventive medication (gabapentin?), declined, will see neurology in few weeks. RTC CPX 3 to 4 months   This visit occurred during the SARS-CoV-2 public health emergency.  Safety protocols were in place, including screening questions prior to the visit, additional usage of  staff PPE, and extensive cleaning of exam room while observing appropriate contact time as indicated for disinfecting solutions.

## 2020-04-04 NOTE — Progress Notes (Signed)
Patient came in today to have her Prolia injection per Dr. Paz.   Injection given SQ in her left arm and patient tolerated well. 

## 2020-04-04 NOTE — Progress Notes (Signed)
Pre visit review using our clinic review tool, if applicable. No additional management support is needed unless otherwise documented below in the visit note. 

## 2020-04-05 NOTE — Assessment & Plan Note (Signed)
HTN: Reports good compliance with Hyzaar, ambulatory BPs very good.  BP today is normal, no change Headache: Saw neurology 03/01/2020, they recommended to continue Topamax, also Fioricet/Zofran/tramadol as needed.  Had nerve block (helped) Had a CT angiogram head and neck: mild atherosclerosis head and neck with no large vessel occlusion. Self d/c Fioricet due to insomnia, also d/c  Topamax due to somnolence.Cont treat w/ tramadol- Zofran prn. Offered to no other preventive medication (gabapentin?), declined, will see neurology in few weeks. RTC CPX 3 to 4 months

## 2020-04-10 ENCOUNTER — Other Ambulatory Visit: Payer: Self-pay | Admitting: Internal Medicine

## 2020-04-12 ENCOUNTER — Ambulatory Visit: Payer: Medicare PPO

## 2020-04-12 ENCOUNTER — Ambulatory Visit: Payer: Medicare PPO | Admitting: Internal Medicine

## 2020-04-23 ENCOUNTER — Other Ambulatory Visit: Payer: Self-pay | Admitting: Internal Medicine

## 2020-05-08 ENCOUNTER — Ambulatory Visit: Payer: Medicare PPO | Admitting: Neurology

## 2020-05-10 ENCOUNTER — Other Ambulatory Visit: Payer: Self-pay

## 2020-05-10 ENCOUNTER — Ambulatory Visit (INDEPENDENT_AMBULATORY_CARE_PROVIDER_SITE_OTHER): Payer: Medicare PPO

## 2020-05-10 DIAGNOSIS — Z23 Encounter for immunization: Secondary | ICD-10-CM

## 2020-05-29 ENCOUNTER — Ambulatory Visit: Payer: Medicare PPO | Attending: Internal Medicine

## 2020-05-29 ENCOUNTER — Other Ambulatory Visit (HOSPITAL_BASED_OUTPATIENT_CLINIC_OR_DEPARTMENT_OTHER): Payer: Self-pay | Admitting: Internal Medicine

## 2020-05-29 DIAGNOSIS — Z23 Encounter for immunization: Secondary | ICD-10-CM

## 2020-05-29 NOTE — Progress Notes (Signed)
   Covid-19 Vaccination Clinic  Name:  Kathleen Thompson    MRN: [id number] DOB: [birth year]  05/29/2020  Kathleen Thompson was observed post Covid-19 immunization for 15 minutes without incident. She was provided with Vaccine Information Sheet and instruction to access the V-Safe system.   Kathleen Thompson was instructed to call 911 with any severe reactions post vaccine: Marland Kitchen Difficulty breathing  . Swelling of face and throat  . A fast heartbeat  . A bad rash all over body  . Dizziness and weakness

## 2020-05-30 ENCOUNTER — Telehealth: Payer: Self-pay | Admitting: Internal Medicine

## 2020-05-31 NOTE — Telephone Encounter (Signed)
Requesting: tramadol 50mg  Contract: 01/05/2018 UDS: 05/19/2019 Low risk Last Visit: 04/04/2020 Next Visit: 08/07/2020 Last Refill: 02/28/2020 #180 and 0RF Pt sig: 1 tab q12h prn  Please Advise

## 2020-05-31 NOTE — Telephone Encounter (Signed)
PDMP okay, Rx sent 

## 2020-06-05 MED FILL — PFIZER-BIONTECH COVID-19 VA: 30 | 1 days supply | Qty: 0 | Fill #0

## 2020-06-22 ENCOUNTER — Encounter: Payer: Self-pay | Admitting: Internal Medicine

## 2020-07-10 ENCOUNTER — Other Ambulatory Visit: Payer: Self-pay | Admitting: Internal Medicine

## 2020-07-20 ENCOUNTER — Other Ambulatory Visit: Payer: Self-pay | Admitting: Internal Medicine

## 2020-08-07 ENCOUNTER — Ambulatory Visit: Payer: Medicare PPO | Admitting: Internal Medicine

## 2020-08-22 ENCOUNTER — Other Ambulatory Visit: Payer: Self-pay | Admitting: Internal Medicine

## 2020-08-29 ENCOUNTER — Telehealth: Payer: Self-pay | Admitting: Internal Medicine

## 2020-08-30 NOTE — Telephone Encounter (Signed)
Requesting: tramadol 50mg   Contract:  12/18/2016 UDS: 05/19/2019 Last Visit: 02/28/2020 Next Visit: 09/04/2020 Last Refill: 05/31/2020 #180 and 0RF Pt sig: 1 tab q12h prn  Please Advise

## 2020-08-30 NOTE — Telephone Encounter (Signed)
PDMP okay, prescription sent 

## 2020-09-04 ENCOUNTER — Encounter: Payer: Self-pay | Admitting: Internal Medicine

## 2020-09-04 ENCOUNTER — Other Ambulatory Visit: Payer: Self-pay

## 2020-09-04 ENCOUNTER — Ambulatory Visit (INDEPENDENT_AMBULATORY_CARE_PROVIDER_SITE_OTHER): Payer: Medicare PPO | Admitting: Internal Medicine

## 2020-09-04 VITALS — BP 120/78 | HR 83 | Temp 98.0°F | Resp 16 | Ht 62.0 in | Wt 157.4 lb

## 2020-09-04 DIAGNOSIS — M8949 Other hypertrophic osteoarthropathy, multiple sites: Secondary | ICD-10-CM

## 2020-09-04 DIAGNOSIS — Z79899 Other long term (current) drug therapy: Secondary | ICD-10-CM

## 2020-09-04 DIAGNOSIS — Z23 Encounter for immunization: Secondary | ICD-10-CM

## 2020-09-04 DIAGNOSIS — M810 Age-related osteoporosis without current pathological fracture: Secondary | ICD-10-CM

## 2020-09-04 DIAGNOSIS — E039 Hypothyroidism, unspecified: Secondary | ICD-10-CM

## 2020-09-04 DIAGNOSIS — Z0001 Encounter for general adult medical examination with abnormal findings: Secondary | ICD-10-CM

## 2020-09-04 DIAGNOSIS — Z0000 Encounter for general adult medical examination without abnormal findings: Secondary | ICD-10-CM

## 2020-09-04 DIAGNOSIS — M159 Polyosteoarthritis, unspecified: Secondary | ICD-10-CM

## 2020-09-04 DIAGNOSIS — E119 Type 2 diabetes mellitus without complications: Secondary | ICD-10-CM | POA: Diagnosis not present

## 2020-09-04 DIAGNOSIS — E7849 Other hyperlipidemia: Secondary | ICD-10-CM

## 2020-09-04 DIAGNOSIS — I10 Essential (primary) hypertension: Secondary | ICD-10-CM | POA: Diagnosis not present

## 2020-09-04 DIAGNOSIS — Z09 Encounter for follow-up examination after completed treatment for conditions other than malignant neoplasm: Secondary | ICD-10-CM

## 2020-09-04 LAB — COMPREHENSIVE METABOLIC PANEL
ALT: 14 U/L (ref 0–35)
AST: 19 U/L (ref 0–37)
Albumin: 4.5 g/dL (ref 3.5–5.2)
Alkaline Phosphatase: 52 U/L (ref 39–117)
BUN: 11 mg/dL (ref 6–23)
CO2: 25 mEq/L (ref 19–32)
Calcium: 9.1 mg/dL (ref 8.4–10.5)
Chloride: 103 mEq/L (ref 96–112)
Creatinine, Ser: 0.94 mg/dL (ref 0.40–1.20)
GFR: 55.73 mL/min — ABNORMAL LOW (ref >60.00)
Glucose, Bld: 102 mg/dL — ABNORMAL HIGH (ref 70–99)
Potassium: 4 mEq/L (ref 3.5–5.1)
Sodium: 137 mEq/L (ref 135–145)
Total Bilirubin: 1.2 mg/dL (ref 0.2–1.2)
Total Protein: 6.8 g/dL (ref 6.0–8.3)

## 2020-09-04 LAB — CBC WITH DIFFERENTIAL/PLATELET
Basophils Absolute: 0 10*3/uL (ref 0.0–0.1)
Basophils Relative: 0.5 % (ref 0.0–3.0)
Eosinophils Absolute: 0.1 10*3/uL (ref 0.0–0.7)
Eosinophils Relative: 1.8 % (ref 0.0–5.0)
HCT: 43.1 % (ref 36.0–46.0)
Hemoglobin: 14.7 g/dL (ref 12.0–15.0)
Lymphocytes Relative: 47 % — ABNORMAL HIGH (ref 12.0–46.0)
Lymphs Abs: 2.5 10*3/uL (ref 0.7–4.0)
MCHC: 34.2 g/dL (ref 30.0–36.0)
MCV: 91 fl (ref 78.0–100.0)
Monocytes Absolute: 0.4 10*3/uL (ref 0.1–1.0)
Monocytes Relative: 7.5 % (ref 3.0–12.0)
Neutro Abs: 2.3 10*3/uL (ref 1.4–7.7)
Neutrophils Relative %: 43.2 % (ref 43.0–77.0)
Platelets: 219 10*3/uL (ref 150.0–400.0)
RBC: 4.73 Mil/uL (ref 3.87–5.11)
RDW: 14.5 % (ref 11.5–15.5)
WBC: 5.2 10*3/uL (ref 4.0–10.5)

## 2020-09-04 LAB — TSH: TSH: 2.4 u[IU]/mL (ref 0.35–4.50)

## 2020-09-04 LAB — LIPID PANEL
Cholesterol: 175 mg/dL (ref 0–200)
HDL: 67.6 mg/dL (ref >39.00)
LDL Cholesterol: 85 mg/dL (ref 0–99)
NonHDL: 107.3
Total CHOL/HDL Ratio: 3
Triglycerides: 114 mg/dL (ref 0.0–149.0)
VLDL: 22.8 mg/dL (ref 0.0–40.0)

## 2020-09-04 LAB — HEMOGLOBIN A1C: Hgb A1c MFr Bld: 6.5 % (ref 4.6–6.5)

## 2020-09-04 NOTE — Progress Notes (Signed)
Subjective:    Patient ID: Kathleen Thompson, female    DOB: 1933/10/09, 87 y.o.   MRN: [id number]  DOS:  09/04/2020 Type of visit - description: CPX General feels well, no major concerns. Headaches have improved. DJD symptoms relatively well controlled.  Review of Systems  Other than above, a 14 point review of systems is negative     Past Medical History:  Diagnosis Date  . Adenomatous colon polyp 02/2003  . Arthritis   . Blood transfusion   . Cataract   . Diverticulosis   . Esophageal stricture   . Gastroparesis   . GERD (gastroesophageal reflux disease)   . Headache   . Hyperlipidemia   . Hypertension   . Hypothyroidism   . Internal hemorrhoid   . Iron deficiency anemia   . Osteoporosis   . PONV (postoperative nausea and vomiting)   . PVD (peripheral vascular disease) ([place])   . Stroke [facility])    TIA  . TIA (transient ischemic attack)     Past Surgical History:  Procedure Laterality Date  . ABDOMINAL HYSTERECTOMY    . APPENDECTOMY    . CERVICAL FUSION    . EYE SURGERY Bilateral 08/2016   cataracts  . LEFT HEART CATHETERIZATION WITH CORONARY ANGIOGRAM N/A 12/20/2012   Procedure: LEFT HEART CATHETERIZATION WITH CORONARY ANGIOGRAM;  Surgeon: Peter M Martinique, MD;  Location: [facility] CATH LAB;  Service: Cardiovascular;  Laterality: N/A;  . shoulder surgery     . VIDEO ASSISTED THORACOSCOPY (VATS)/WEDGE RESECTION Right 03/14/2013   Procedure: VIDEO ASSISTED THORACOSCOPY (VATS)/WEDGE RESECTION;  Surgeon: Melrose Nakayama, MD;  Location: [place];  Service: Thoracic;  Laterality: Right;    Allergies as of 09/04/2020      Reactions   Clindamycin/lincomycin Rash   Cymbalta [duloxetine Hcl]    Jittery, nervous, could not sleep   Hydrocodone Hives   Rofecoxib    hives   Latex Rash   Pt states that latex causes blisters to skin      Medication List       Accurate as of September 04, 2020 11:59 PM. If you have any questions, ask your nurse or doctor.        STOP taking  these medications   predniSONE 10 MG tablet Commonly known as: DELTASONE Stopped by: Kathlene November, MD     TAKE these medications   aspirin 81 MG tablet Take 81 mg by mouth daily.   CALCIUM PO Take 1 tablet by mouth 2 (two) times daily.   clopidogrel 75 MG tablet Commonly known as: PLAVIX Take 1 tablet (75 mg total) by mouth daily.   CO Q-10 PO Take 1 tablet by mouth daily.   isosorbide mononitrate 30 MG 24 hr tablet Commonly known as: IMDUR Take 1 tablet (30 mg total) by mouth daily.   losartan-hydrochlorothiazide 100-12.5 MG tablet Commonly known as: HYZAAR Take 0.5 tablets by mouth daily.   magnesium 30 MG tablet Take 30 mg by mouth daily.   ondansetron 8 MG tablet Commonly known as: Zofran Take 1 tablet (8 mg total) by mouth every 8 (eight) hours as needed for nausea or vomiting.   pantoprazole 40 MG tablet Commonly known as: PROTONIX Take 1 tablet (40 mg total) by mouth daily before breakfast.   rosuvastatin 20 MG tablet Commonly known as: CRESTOR Take 1 tablet (20 mg total) by mouth daily.   Synthroid 50 MCG tablet Generic drug: levothyroxine Take 1 tablet (50 mcg total) by mouth daily before breakfast.  traMADol 50 MG tablet Commonly known as: ULTRAM TAKE 1 TABLET BY MOUTH EVERY 12 HOURS AS NEEDED   vitamin D (CHOLECALCIFEROL) 400 units tablet Take 400 Units by mouth daily.          Objective:   Physical Exam BP 120/78 (BP Location: Left Arm, Patient Position: Sitting, Cuff Size: Small)   Pulse 83   Temp 98 F (36.7 C) (Oral)   Resp 16   Ht 5\' 2"  (1.575 m)   Wt 157 lb 6 oz (71.4 kg)   SpO2 99%   BMI 28.78 kg/m  General: Well developed, NAD, BMI noted Neck: No  thyromegaly  HEENT:  Normocephalic . Face symmetric, atraumatic Lungs:  CTA B Normal respiratory effort, no intercostal retractions, no accessory muscle use. Heart: RRR,  no murmur.  Abdomen:  Not distended, soft, non-tender. No rebound or rigidity.   DM foot exam: Today  essentially no pretibial edema, good pedal pulses, pinprick examination normal Skin: Exposed areas without rash. Not pale. Not jaundice Neurologic:  alert & oriented X3.  Speech normal, gait appropriate for age and unassisted Strength symmetric and appropriate for age.  Psych: Cognition and judgment appear intact.  Cooperative with normal attention span and concentration.  Behavior appropriate. No anxious or depressed appearing.     Assessment    Assessment DM: DX 05-2019, A1c 6.6.  HTN Hypothyroidism Hyperlipidemia CV: --CAD cath 12/2012 single-vessel CAD,rx CV RF control, Rx Plavix, imdur --TIA remotely , apparently amaurosis fugax, was a started on on plavix per PCP, no further sx since. --On ASA and Plavix: Discuss 11/17/2016 Osteoporosis -  DEXAs @ [facility], T score -3.9 (12/2015).Prolia #1:  07/2016 MSK: Dr Lorin Mercy --DJD- s/p neck and shoulder surgeries ; + DJD cervical spine --knee pain: on tramadol rx per pcp GI: GED, gastroparesis, diverticulosis, esophageal stricture H/o Iron deficiency anemia H/o granuloma RUL, resected 03-14-13  PLAN: Here for CPX DM: Diet controlled, foot exam normal, denies any paresthesias. Check A1c HTN: On Hyzaar, Imdur, BP is very good, labs. Hypothyroidism: On Synthroid, check TSH CAD, history of TIA: No symptoms, on aspirin and Plavix, no change. High cholesterol: Crestor, check labs Osteoporosis: On calcium, vitamin D, Prolia, check a DEXA Headaches: Currently well controlled, since she had a nerve block symptoms are manageable with Zofran and tramadol. DJD: On tramadol as needed, UDS and contract today RTC 4 to 5 months  In addition to CPX, multiple all issues addressed.  This visit occurred during the SARS-CoV-2 public health emergency.  Safety protocols were in place, including screening questions prior to the visit, additional usage of staff PPE, and extensive cleaning of exam room while observing appropriate contact time as  indicated for disinfecting solutions.

## 2020-09-04 NOTE — Progress Notes (Unsigned)
Pre visit review using our clinic review tool, if applicable. No additional management support is needed unless otherwise documented below in the visit note. 

## 2020-09-04 NOTE — Patient Instructions (Addendum)
Per our records you are due for an eye exam. Please contact your eye doctor to schedule an appointment. Please have them send copies of your office visit notes to [place]. Our fax number is [phone].     GO TO THE LAB : Get the blood work     [place], [place] back for a checkup in 4 to 5 months.        Advance Directive  Advance directives are legal documents that allow you to make decisions about your health care and medical treatment in case you become unable to communicate for yourself. Advance directives let your wishes be known to family, friends, and health care providers. Discussing and writing advance directives should happen over time rather than all at once. Advance directives can be changed and updated at any time. There are different types of advance directives, such as:  Medical power of attorney.  Living will.  Do not resuscitate (DNR) order or do not attempt resuscitation (DNAR) order. Health care proxy and medical power of attorney A health care proxy is also called a health care agent. This person is appointed to make medical decisions for you when you are unable to make decisions for yourself. Generally, people ask a trusted friend or family member to act as their proxy and represent their preferences. Make sure you have an agreement with your trusted person to act as your proxy. A proxy may have to make a medical decision on your behalf if your wishes are not known. A medical power of attorney, also called a durable power of attorney for health care, is a legal document that names your health care proxy. Depending on the laws in your state, the document may need to be:  Signed.  Notarized.  Dated.  Copied.  Witnessed.  Incorporated into your medical record. You may also want to appoint a trusted person to manage your money in the event you are unable to do so. This is called a durable power of attorney for finances.  It is a separate legal document from the durable power of attorney for health care. You may choose your health care proxy or someone different to act as your agent in money matters. If you do not appoint a proxy, or there is a concern that the proxy is not acting in your best interest, a court may appoint a guardian to act on your behalf. Living will A living will is a set of instructions that state your wishes about medical care when you cannot express them yourself. Health care providers should keep a copy of your living will in your medical record. You may want to give a copy to family members or friends. To alert caregivers in case of an emergency, you can place a card in your wallet to let them know that you have a living will and where they can find it. A living will is used if you become:  Terminally ill.  Disabled.  Unable to communicate or make decisions. The following decisions should be included in your living will:  To use or not to use life support equipment, such as dialysis machines and breathing machines (ventilators).  Whether you want a DNR or DNAR order. This tells health care providers not to use cardiopulmonary resuscitation (CPR) if breathing or heartbeat stops.  To use or not to use tube feeding.  To be given or not to be given food and fluids.  Whether you want  comfort (palliative) care when the goal becomes comfort rather than a cure.  Whether you want to donate your organs and tissues. A living will does not give instructions for distributing your money and property if you should pass away. DNR or DNAR A DNR or DNAR order is a request not to have CPR in the event that your heart stops beating or you stop breathing. If a DNR or DNAR order has not been made and shared, a health care provider will try to help any patient whose heart has stopped or who has stopped breathing. If you plan to have surgery, talk with your health care provider about how your DNR or DNAR  order will be followed if problems occur. What if I do not have an advance directive? Some states assign family decision makers to act on your behalf if you do not have an advance directive. Each state has its own laws about advance directives. You may want to check with your health care provider, attorney, or state representative about the laws in your state. Summary  Advance directives are legal documents that allow you to make decisions about your health care and medical treatment in case you become unable to communicate for yourself.  The process of discussing and writing advance directives should happen over time. You can change and update advance directives at any time.  Advance directives may include a medical power of attorney, a living will, and a DNR or DNAR order. This information is not intended to replace advice given to you by your health care provider. Make sure you discuss any questions you have with your health care provider. Document Revised: 05/01/2020 Document Reviewed: 05/01/2020 Elsevier Patient Education  2021 [employer] Prevention in the Home, Adult Falls can cause injuries and can affect people from all age groups. There are many simple things that you can do to make your home safe and to help prevent falls. Ask for help when making these changes, if needed. What actions can I take to prevent falls? General instructions  Use good lighting in all rooms. Replace any light bulbs that burn out.  Turn on lights if it is dark. Use night-lights.  Place frequently used items in easy-to-reach places. Lower the shelves around your home if necessary.  Set up furniture so that there are clear paths around it. Avoid moving your furniture around.  Remove throw rugs and other tripping hazards from the floor.  Avoid walking on wet floors.  Fix any uneven floor surfaces.  Add color or contrast paint or tape to grab bars and handrails in your home. Place  contrasting color strips on the first and last steps of stairways.  When you use a stepladder, make sure that it is completely opened and that the sides are firmly locked. Have someone hold the ladder while you are using it. Do not climb a closed stepladder.  Be aware of any and all pets. What can I do in the bathroom?  Keep the floor dry. Immediately clean up any water that spills onto the floor.  Remove soap buildup in the tub or shower on a regular basis.  Use non-skid mats or decals on the floor of the tub or shower.  Attach bath mats securely with double-sided, non-slip rug tape.  If you need to sit down while you are in the shower, use a plastic, non-slip stool.  Install grab bars by the toilet and in the tub and shower. Do not use towel  bars as grab bars.      What can I do in the bedroom?  Make sure that a bedside light is easy to reach.  Do not use oversized bedding that drapes onto the floor.  Have a firm chair that has side arms to use for getting dressed. What can I do in the kitchen?  Clean up any spills right away.  If you need to reach for something above you, use a sturdy step stool that has a grab bar.  Keep electrical cables out of the way.  Do not use floor polish or wax that makes floors slippery. If you must use wax, make sure that it is non-skid floor wax. What can I do in the stairways?  Do not leave any items on the stairs.  Make sure that you have a light switch at the top of the stairs and the bottom of the stairs. Have them installed if you do not have them.  Make sure that there are handrails on both sides of the stairs. Fix handrails that are broken or loose. Make sure that handrails are as long as the stairways.  Install non-slip stair treads on all stairs in your home.  Avoid having throw rugs at the top or bottom of stairways, or secure the rugs with carpet tape to prevent them from moving.  Choose a carpet design that does not hide the  edge of steps on the stairway.  Check any carpeting to make sure that it is firmly attached to the stairs. Fix any carpet that is loose or worn. What can I do on the outside of my home?  Use bright outdoor lighting.  Regularly repair the edges of walkways and driveways and fix any cracks.  Remove high doorway thresholds.  Trim any shrubbery on the main path into your home.  Regularly check that handrails are securely fastened and in good repair. Both sides of any steps should have handrails.  Install guardrails along the edges of any raised decks or porches.  Clear walkways of debris and clutter, including tools and rocks.  Have leaves, snow, and ice cleared regularly.  Use sand or salt on walkways during winter months.  In the garage, clean up any spills right away, including grease or oil spills. What other actions can I take?  Wear closed-toe shoes that fit well and support your feet. Wear shoes that have rubber soles or low heels.  Use mobility aids as needed, such as canes, walkers, scooters, and crutches.  Review your medicines with your health care provider. Some medicines can cause dizziness or changes in blood pressure, which increase your risk of falling. Talk with your health care provider about other ways that you can decrease your risk of falls. This may include working with a physical therapist or trainer to improve your strength, balance, and endurance. Where to find more information  Centers for Disease Control and Prevention, [employer]: WebmailGuide.co.za  [employer] on Aging: BrainJudge.co.uk Contact a health care provider if:  You are afraid of falling at home.  You feel weak, drowsy, or dizzy at home.  You fall at home. Summary  There are many simple things that you can do to make your home safe and to help prevent falls.  Ways to make your home safe include removing tripping hazards and installing grab bars in the bathroom.  Ask  for help when making these changes in your home. This information is not intended to replace advice given to you by your  health care provider. Make sure you discuss any questions you have with your health care provider. Document Revised: 07/10/2017 Document Reviewed: 03/12/2017 Elsevier Patient Education  2021 [employer].

## 2020-09-05 ENCOUNTER — Encounter: Payer: Self-pay | Admitting: Internal Medicine

## 2020-09-05 NOTE — Assessment & Plan Note (Signed)
Here for CPX DM: Diet controlled, foot exam normal, denies any paresthesias. Check A1c HTN: On Hyzaar, Imdur, BP is very good, labs. Hypothyroidism: On Synthroid, check TSH CAD, history of TIA: No symptoms, on aspirin and Plavix, no change. High cholesterol: Crestor, check labs Osteoporosis: On calcium, vitamin D, Prolia, check a DEXA Headaches: Currently well controlled, since she had a nerve block symptoms are manageable with Zofran and tramadol. DJD: On tramadol as needed, UDS and contract today RTC 4 to 5 months

## 2020-09-06 LAB — DRUG MONITORING, PANEL 8 WITH CONFIRMATION, URINE
6 Acetylmorphine: NEGATIVE ng/mL (ref <10)
Alcohol Metabolites: NEGATIVE ng/mL
Alphahydroxyalprazolam: NEGATIVE ng/mL (ref <25)
Alphahydroxymidazolam: NEGATIVE ng/mL (ref <50)
Alphahydroxytriazolam: NEGATIVE ng/mL (ref <50)
Aminoclonazepam: NEGATIVE ng/mL (ref <25)
Amphetamines: NEGATIVE ng/mL (ref <500)
Benzodiazepines: POSITIVE ng/mL — AB (ref <100)
Buprenorphine, Urine: NEGATIVE ng/mL (ref <5)
Cocaine Metabolite: NEGATIVE ng/mL (ref <150)
Creatinine: 71.3 mg/dL
Hydroxyethylflurazepam: NEGATIVE ng/mL (ref <50)
Lorazepam: NEGATIVE ng/mL (ref <50)
MDMA: NEGATIVE ng/mL (ref <500)
Marijuana Metabolite: NEGATIVE ng/mL (ref <20)
Nordiazepam: NEGATIVE ng/mL (ref <50)
Opiates: NEGATIVE ng/mL (ref <100)
Oxazepam: NEGATIVE ng/mL (ref <50)
Oxidant: NEGATIVE ug/mL
Oxycodone: NEGATIVE ng/mL (ref <100)
Temazepam: 59 ng/mL — ABNORMAL HIGH (ref <50)
pH: 7.5 (ref 4.5–9.0)

## 2020-09-06 LAB — DRUG MONITOR, TRAMADOL,QN, URINE
Desmethyltramadol: 3458 ng/mL — ABNORMAL HIGH (ref <100)
Tramadol: 4719 ng/mL — ABNORMAL HIGH (ref <100)

## 2020-09-06 LAB — DM TEMPLATE

## 2020-09-06 NOTE — Assessment & Plan Note (Signed)
--  Tdap: 2011, booster at the next opportunity - PNA 23: 2011, booster today - PNM 13: 2015  - zostavax7/18/12;  s/p shingrix @ the pharmacy per pt -COVID vaccine x3 -  s/p flu shot   --No further paps or mammograms. See previous entries.  --CCS: 2 siblings w/ colon ca; last cscope  2013 , Dr Fuller Plan,, diverticulosis,next 2018. We discussed the issue, she will let me know if/when ready. -Patient education: Diet, exercise, fall prevention, advance care planning - labs CMP, FLP, CBC, A1c, TSH, UDS

## 2020-10-11 ENCOUNTER — Encounter: Payer: Self-pay | Admitting: Internal Medicine

## 2020-10-11 DIAGNOSIS — M85852 Other specified disorders of bone density and structure, left thigh: Secondary | ICD-10-CM | POA: Diagnosis not present

## 2020-10-11 DIAGNOSIS — Z780 Asymptomatic menopausal state: Secondary | ICD-10-CM | POA: Diagnosis not present

## 2020-10-11 DIAGNOSIS — M810 Age-related osteoporosis without current pathological fracture: Secondary | ICD-10-CM | POA: Diagnosis not present

## 2020-10-16 ENCOUNTER — Ambulatory Visit (INDEPENDENT_AMBULATORY_CARE_PROVIDER_SITE_OTHER): Payer: Medicare PPO | Admitting: *Deleted

## 2020-10-16 ENCOUNTER — Other Ambulatory Visit: Payer: Self-pay | Admitting: Internal Medicine

## 2020-10-16 ENCOUNTER — Other Ambulatory Visit: Payer: Self-pay

## 2020-10-16 DIAGNOSIS — M810 Age-related osteoporosis without current pathological fracture: Secondary | ICD-10-CM | POA: Diagnosis not present

## 2020-10-16 MED ORDER — DENOSUMAB 60 MG/ML ~~LOC~~ SOSY
60.0000 mg | PREFILLED_SYRINGE | Freq: Once | SUBCUTANEOUS | Status: AC
  Administered 2020-10-16: 60 mg via SUBCUTANEOUS

## 2020-10-16 NOTE — Progress Notes (Signed)
Patient came in today to have her Prolia injection per Dr. Larose Kells.   Injection given SQ in her left arm and patient tolerated well.

## 2020-10-17 ENCOUNTER — Encounter: Payer: Self-pay | Admitting: Internal Medicine

## 2020-10-18 ENCOUNTER — Encounter: Payer: Self-pay | Admitting: Internal Medicine

## 2020-10-18 LAB — DG BONE DENSITY

## 2020-10-29 ENCOUNTER — Other Ambulatory Visit: Payer: Self-pay | Admitting: Internal Medicine

## 2020-11-06 ENCOUNTER — Other Ambulatory Visit: Payer: Self-pay | Admitting: Internal Medicine

## 2020-11-22 ENCOUNTER — Other Ambulatory Visit: Payer: Self-pay | Admitting: Internal Medicine

## 2020-11-26 ENCOUNTER — Telehealth: Payer: Self-pay | Admitting: Internal Medicine

## 2020-11-26 NOTE — Telephone Encounter (Signed)
PDMP okay, Rx sent 

## 2020-11-26 NOTE — Telephone Encounter (Signed)
Requesting: tramadol 50mg   Contract: 09/04/20 UDS: 09/04/20 Last Visit:09/04/20 Next Visit: 01/08/2021 Last Refill: 08/30/2020 #180 and 0RF  Please Advise

## 2020-11-29 ENCOUNTER — Encounter: Payer: Self-pay | Admitting: Internal Medicine

## 2021-01-08 ENCOUNTER — Other Ambulatory Visit: Payer: Self-pay | Admitting: Internal Medicine

## 2021-01-08 ENCOUNTER — Encounter: Payer: Self-pay | Admitting: Internal Medicine

## 2021-01-08 ENCOUNTER — Other Ambulatory Visit: Payer: Self-pay

## 2021-01-08 ENCOUNTER — Ambulatory Visit: Payer: Medicare PPO | Admitting: Internal Medicine

## 2021-01-08 VITALS — BP 126/80 | HR 81 | Temp 98.3°F | Resp 16 | Ht 62.0 in | Wt 160.0 lb

## 2021-01-08 DIAGNOSIS — Z7185 Encounter for immunization safety counseling: Secondary | ICD-10-CM | POA: Diagnosis not present

## 2021-01-08 DIAGNOSIS — I10 Essential (primary) hypertension: Secondary | ICD-10-CM | POA: Diagnosis not present

## 2021-01-08 DIAGNOSIS — E7849 Other hyperlipidemia: Secondary | ICD-10-CM | POA: Diagnosis not present

## 2021-01-08 DIAGNOSIS — E119 Type 2 diabetes mellitus without complications: Secondary | ICD-10-CM | POA: Diagnosis not present

## 2021-01-08 DIAGNOSIS — E039 Hypothyroidism, unspecified: Secondary | ICD-10-CM

## 2021-01-08 DIAGNOSIS — L603 Nail dystrophy: Secondary | ICD-10-CM

## 2021-01-08 NOTE — Progress Notes (Signed)
Subjective:    Patient ID: Kathleen Thompson, female    DOB: 1933-12-21, 87 y.o.   MRN: [id number]  DOS:  01/08/2021 Type of visit - description: ROV  Since the last office visit she is feeling well. Today we talk about her recent bone density test, diabetes, hypertension. We also talk about immunizations She has some difficulty bending and taking care of her nails, referral?. Good compliance with medications.   Review of Systems Denies chest pain or difficulty breathing. Previously had lower extremity edema, that seems to be better now No nausea, vomiting, diarrhea.  No blood in the stools  Past Medical History:  Diagnosis Date  . Adenomatous colon polyp 02/2003  . Arthritis   . Blood transfusion   . Cataract   . Diverticulosis   . Esophageal stricture   . Gastroparesis   . GERD (gastroesophageal reflux disease)   . Headache   . Hyperlipidemia   . Hypertension   . Hypothyroidism   . Internal hemorrhoid   . Iron deficiency anemia   . Osteoporosis   . PONV (postoperative nausea and vomiting)   . PVD (peripheral vascular disease) ([place])   . Stroke [facility])    TIA  . TIA (transient ischemic attack)     Past Surgical History:  Procedure Laterality Date  . ABDOMINAL HYSTERECTOMY    . APPENDECTOMY    . CERVICAL FUSION    . EYE SURGERY Bilateral 08/2016   cataracts  . LEFT HEART CATHETERIZATION WITH CORONARY ANGIOGRAM N/A 12/20/2012   Procedure: LEFT HEART CATHETERIZATION WITH CORONARY ANGIOGRAM;  Surgeon: Peter M Martinique, MD;  Location: [facility] CATH LAB;  Service: Cardiovascular;  Laterality: N/A;  . shoulder surgery     . VIDEO ASSISTED THORACOSCOPY (VATS)/WEDGE RESECTION Right 03/14/2013   Procedure: VIDEO ASSISTED THORACOSCOPY (VATS)/WEDGE RESECTION;  Surgeon: Melrose Nakayama, MD;  Location: [place];  Service: Thoracic;  Laterality: Right;    Allergies as of 01/08/2021      Reactions   Clindamycin/lincomycin Rash   Cymbalta [duloxetine Hcl]    Jittery, nervous, could not  sleep   Hydrocodone Hives   Rofecoxib    hives   Latex Rash   Pt states that latex causes blisters to skin      Medication List       Accurate as of Jan 08, 2021  9:12 PM. If you have any questions, ask your nurse or doctor.        STOP taking these medications   Pfizer-BioNTech COVID-19 Vacc 30 MCG/0.3ML injection Generic drug: COVID-19 mRNA vaccine Therapist, music) Stopped by: Kathlene November, MD     TAKE these medications   aspirin 81 MG tablet Take 81 mg by mouth daily.   CALCIUM PO Take 1 tablet by mouth 2 (two) times daily.   clopidogrel 75 MG tablet Commonly known as: PLAVIX Take 1 tablet (75 mg total) by mouth daily.   CO Q-10 PO Take 1 tablet by mouth daily.   isosorbide mononitrate 30 MG 24 hr tablet Commonly known as: IMDUR Take 1 tablet (30 mg total) by mouth daily.   losartan-hydrochlorothiazide 100-12.5 MG tablet Commonly known as: HYZAAR Take 0.5 tablets by mouth daily.   magnesium 30 MG tablet Take 30 mg by mouth daily.   ondansetron 8 MG tablet Commonly known as: Zofran Take 1 tablet (8 mg total) by mouth every 8 (eight) hours as needed for nausea or vomiting.   pantoprazole 40 MG tablet Commonly known as: PROTONIX Take 1 tablet (40 mg total)  by mouth daily before breakfast.   rosuvastatin 20 MG tablet Commonly known as: CRESTOR Take 1 tablet (20 mg total) by mouth daily.   Synthroid 50 MCG tablet Generic drug: levothyroxine Take 1 tablet (50 mcg total) by mouth daily before breakfast.   traMADol 50 MG tablet Commonly known as: ULTRAM TAKE 1 TABLET BY MOUTH EVERY 12 HOURS AS NEEDED   vitamin D (CHOLECALCIFEROL) 400 units tablet Take 400 Units by mouth daily.          Objective:   Physical Exam BP 126/80 (BP Location: Left Arm, Patient Position: Sitting, Cuff Size: Small)   Pulse 81   Temp 98.3 F (36.8 C) (Oral)   Resp 16   Ht 5\' 2"  (1.575 m)   Wt 160 lb (72.6 kg)   SpO2 98%   BMI 29.26 kg/m  General:   Well developed, NAD, BMI  noted. HEENT:  Normocephalic . Face symmetric, atraumatic Lungs:  CTA B Normal respiratory effort, no intercostal retractions, no accessory muscle use. Heart: RRR,  no murmur.  Lower extremities:  trace pretibial edema bilaterally  Skin: Not pale. Not jaundice Neurologic:  alert & oriented X3.  Speech normal, gait appropriate for age and unassisted Psych--  Cognition and judgment appear intact.  Cooperative with normal attention span and concentration.  Behavior appropriate. No anxious or depressed appearing.      Assessment     Assessment DM: DX 05-2019, A1c 6.6.  HTN Hypothyroidism Hyperlipidemia CV: --CAD cath 12/2012 single-vessel CAD,rx CV RF control, Rx Plavix, imdur --TIA remotely , apparently amaurosis fugax, was a started on on plavix per PCP, no further sx since. --On ASA and Plavix: Discuss 11/17/2016 Osteoporosis -  DEXAs @ [facility], T score -3.9 (12/2015).Prolia #1:  07/2016 MSK: Dr Lorin Mercy --DJD- s/p neck and shoulder surgeries ; + DJD cervical spine --knee pain: on tramadol rx per pcp GI: GED, gastroparesis, diverticulosis, esophageal stricture H/o Iron deficiency anemia H/o granuloma RUL, resected 03-14-13  PLAN:  DM: Diet controlled, check A1c. Unable to take care of her nails, podiatry referral.  Has diabetes. HTN: BP today is excellent, continue Hyzaar, Imdur, check a BMP. Hypothyroidism: Good compliance with Synthroid, check TSH. High cholesterol: Last LDL 87, ideal LDL 70.  For now continue Crestor 20 mg recheck on RTC. Vaccinations: Reluctant to have COVID-vaccine #4, issue d/w pt: pros> cons Recommended Tdap RTC 08-2021 CPX   This visit occurred during the SARS-CoV-2 public health emergency.  Safety protocols were in place, including screening questions prior to the visit, additional usage of staff PPE, and extensive cleaning of exam room while observing appropriate contact time as indicated for disinfecting solutions.

## 2021-01-08 NOTE — Assessment & Plan Note (Signed)
DM: Diet controlled, check A1c. Unable to take care of her nails, podiatry referral.  Has diabetes. HTN: BP today is excellent, continue Hyzaar, Imdur, check a BMP. Hypothyroidism: Good compliance with Synthroid, check TSH. High cholesterol: Last LDL 87, ideal LDL 70.  For now continue Crestor 20 mg recheck on RTC. Vaccinations: Reluctant to have COVID-vaccine #4, issue d/w pt: pros> cons Recommended Tdap RTC 08-2021 CPX

## 2021-01-08 NOTE — Patient Instructions (Signed)
Please consider get a  Tdap at your pharmacy    [place] LAB : Get the blood work     [place], [place] Come back for for our physical exam by January 2023

## 2021-01-09 LAB — BASIC METABOLIC PANEL
BUN: 11 mg/dL (ref 6–23)
CO2: 26 mEq/L (ref 19–32)
Calcium: 9.3 mg/dL (ref 8.4–10.5)
Chloride: 102 mEq/L (ref 96–112)
Creatinine, Ser: 0.87 mg/dL (ref 0.40–1.20)
GFR: 61 mL/min (ref >60.00)
Glucose, Bld: 120 mg/dL — ABNORMAL HIGH (ref 70–99)
Potassium: 3.8 mEq/L (ref 3.5–5.1)
Sodium: 138 mEq/L (ref 135–145)

## 2021-01-09 LAB — HEMOGLOBIN A1C: Hgb A1c MFr Bld: 6.6 % — ABNORMAL HIGH (ref 4.6–6.5)

## 2021-01-09 LAB — TSH: TSH: 2.33 u[IU]/mL (ref 0.35–4.50)

## 2021-02-21 ENCOUNTER — Other Ambulatory Visit: Payer: Self-pay | Admitting: Internal Medicine

## 2021-02-28 ENCOUNTER — Telehealth: Payer: Self-pay | Admitting: Internal Medicine

## 2021-02-28 NOTE — Telephone Encounter (Signed)
Requesting: tramadol 50mg   Contract: 09/04/2020 UDS: 09/04/2020 Last Visit: 01/08/2021 Next Visit: 09/10/2021 Last Refill: 11/26/2020 #180 and 0RF Pt sig: 1 tab q12h prn  Please Advise

## 2021-03-01 NOTE — Telephone Encounter (Signed)
Pdmp ok, rx sent  ? ?

## 2021-03-12 ENCOUNTER — Ambulatory Visit: Payer: Medicare PPO | Admitting: Podiatry

## 2021-03-12 ENCOUNTER — Encounter: Payer: Self-pay | Admitting: Podiatry

## 2021-03-12 ENCOUNTER — Other Ambulatory Visit: Payer: Self-pay

## 2021-03-12 DIAGNOSIS — M79674 Pain in right toe(s): Secondary | ICD-10-CM

## 2021-03-12 DIAGNOSIS — B351 Tinea unguium: Secondary | ICD-10-CM

## 2021-03-12 DIAGNOSIS — E119 Type 2 diabetes mellitus without complications: Secondary | ICD-10-CM

## 2021-03-12 DIAGNOSIS — M79675 Pain in left toe(s): Secondary | ICD-10-CM

## 2021-03-12 MED ORDER — CICLOPIROX 8 % EX SOLN: Freq: Every day | CUTANEOUS | 0 refills | Status: DC

## 2021-03-17 NOTE — Progress Notes (Signed)
  Subjective:  Patient ID: Kathleen Thompson, female    DOB: 1933-10-21,  MRN: [id number]  Chief Complaint  Patient presents with   Nail Problem    routine foot exam,  nail trim    87 y.o. female presents with the above complaint. History confirmed with patient.  She is diabetic her A1c is 6.6%  Objective:  Physical Exam: warm, good capillary refill, no trophic changes or ulcerative lesions, normal DP and PT pulses, normal monofilament exam, and normal sensory exam. Left Foot: dystrophic yellowed discolored nail plates with subungual debris Right Foot: dystrophic yellowed discolored nail plates with subungual debris  Assessment:   1. Pain due to onychomycosis of toenails of both feet   2. Diabetes mellitus without complication ([place])      Plan:  Patient was evaluated and treated and all questions answered.  Patient educated on diabetes. Discussed proper diabetic foot care and discussed risks and complications of disease. Educated patient in depth on reasons to return to the office immediately should he/she discover anything concerning or new on the feet. All questions answered. Discussed proper shoes as well.   Discussed the etiology and treatment options for the condition in detail with the patient. Educated patient on the topical and oral treatment options for mycotic nails. Recommended debridement of the nails today. Sharp and mechanical debridement performed of all painful and mycotic nails today. Nails debrided in length and thickness using a nail nipper to level of comfort. Discussed treatment options including appropriate shoe gear. Follow up as needed for painful nails.    Return in about 3 months (around 06/12/2021) for painful nails .

## 2021-04-05 ENCOUNTER — Telehealth: Payer: Self-pay | Admitting: Internal Medicine

## 2021-04-05 MED ORDER — LEVOTHYROXINE SODIUM 50 MCG PO TABS: 50.0000 ug | ORAL_TABLET | Freq: Every day | ORAL | 1 refills | Status: DC

## 2021-04-05 NOTE — Telephone Encounter (Signed)
Rx sent 

## 2021-04-05 NOTE — Telephone Encounter (Signed)
Patient would like to refill her synthroid but the generic brand.  Medication:  SYNTHROID 50 MCG tablet  Has the patient contacted their pharmacy? No. (If no, request that the patient contact the pharmacy for the refill.) (If yes, when and what did the pharmacy advise?)  Preferred Pharmacy (with phone number or street name):  [facility] [address] [place], [place] - [address]  [address] [place] [address]  Phone:  [phone]  Fax:  [phone]   Agent: Please be advised that RX refills may take up to 3 business days. We ask that you follow-up with your pharmacy.

## 2021-04-21 ENCOUNTER — Other Ambulatory Visit: Payer: Self-pay | Admitting: Internal Medicine

## 2021-04-25 ENCOUNTER — Other Ambulatory Visit: Payer: Self-pay

## 2021-04-25 ENCOUNTER — Ambulatory Visit (INDEPENDENT_AMBULATORY_CARE_PROVIDER_SITE_OTHER): Payer: Medicare PPO

## 2021-04-25 ENCOUNTER — Encounter: Payer: Self-pay | Admitting: Internal Medicine

## 2021-04-25 DIAGNOSIS — M810 Age-related osteoporosis without current pathological fracture: Secondary | ICD-10-CM | POA: Diagnosis not present

## 2021-04-25 DIAGNOSIS — Z23 Encounter for immunization: Secondary | ICD-10-CM

## 2021-04-25 MED ORDER — DENOSUMAB 60 MG/ML ~~LOC~~ SOSY
60.0000 mg | PREFILLED_SYRINGE | Freq: Once | SUBCUTANEOUS | Status: AC
  Administered 2021-04-25: 60 mg via SUBCUTANEOUS

## 2021-04-25 NOTE — Progress Notes (Signed)
DANIS CIANO is a 87 y.o. female presents to the office today for prolia:  injections, per physician's orders. Original order: Per Dr. Lucie Leather (med), 60 mg per ml (dose),  subcutaneous (route) was administered lt arm (location) today. Patient tolerated injection.  Patient next injection due: in 6 months, appt will be made after checking current insurance benefits.    Patient also received a high dose flu shot today.  Jiles Prows

## 2021-05-15 ENCOUNTER — Ambulatory Visit (INDEPENDENT_AMBULATORY_CARE_PROVIDER_SITE_OTHER): Payer: Medicare PPO

## 2021-05-15 VITALS — Ht 62.0 in | Wt 160.0 lb

## 2021-05-15 DIAGNOSIS — Z0000 Encounter for general adult medical examination without abnormal findings: Secondary | ICD-10-CM

## 2021-05-15 NOTE — Patient Instructions (Signed)
Kathleen Thompson , Thank you for taking time to complete your Medicare Wellness Visit. I appreciate your ongoing commitment to your health goals. Please review the following plan we discussed and let me know if I can assist you in the future.   Screening recommendations/referrals: Colonoscopy: No longer required Mammogram: Declined Bone Density: Completed 10/11/2020-Due 10/12/2022 Recommended yearly ophthalmology/optometry visit for glaucoma screening and checkup Recommended yearly dental visit for hygiene and checkup  Vaccinations: Influenza vaccine: Up to date Pneumococcal vaccine: Up to date Tdap vaccine: Discuss with pharmacy Shingles vaccine: Completed vaccines   Covid-19:Declined booster  Advanced directives: Information mailed today.  Conditions/risks identified: See problem list  Next appointment: Follow up in one year for your annual wellness visit    Preventive Care 65 Years and Older, Female Preventive care refers to lifestyle choices and visits with your health care provider that can promote health and wellness. What does preventive care include? A yearly physical exam. This is also called an annual well check. Dental exams once or twice a year. Routine eye exams. Ask your health care provider how often you should have your eyes checked. Personal lifestyle choices, including: Daily care of your teeth and gums. Regular physical activity. Eating a healthy diet. Avoiding tobacco and drug use. Limiting alcohol use. Practicing safe sex. Taking low-dose aspirin every day. Taking vitamin and mineral supplements as recommended by your health care provider. What happens during an annual well check? The services and screenings done by your health care provider during your annual well check will depend on your age, overall health, lifestyle risk factors, and family history of disease. Counseling  Your health care provider may ask you questions about your: Alcohol use. Tobacco  use. Drug use. Emotional well-being. Home and relationship well-being. Sexual activity. Eating habits. History of falls. Memory and ability to understand (cognition). Work and work Statistician. Reproductive health. Screening  You may have the following tests or measurements: Height, weight, and BMI. Blood pressure. Lipid and cholesterol levels. These may be checked every 5 years, or more frequently if you are over 85 years old. Skin check. Lung cancer screening. You may have this screening every year starting at age 85 if you have a 30-pack-year history of smoking and currently smoke or have quit within the past 15 years. Fecal occult blood test (FOBT) of the stool. You may have this test every year starting at age 85. Flexible sigmoidoscopy or colonoscopy. You may have a sigmoidoscopy every 5 years or a colonoscopy every 10 years starting at age 40. Hepatitis C blood test. Hepatitis B blood test. Sexually transmitted disease (STD) testing. Diabetes screening. This is done by checking your blood sugar (glucose) after you have not eaten for a while (fasting). You may have this done every 1-3 years. Bone density scan. This is done to screen for osteoporosis. You may have this done starting at age 85. Mammogram. This may be done every 1-2 years. Talk to your health care provider about how often you should have regular mammograms. Talk with your health care provider about your test results, treatment options, and if necessary, the need for more tests. Vaccines  Your health care provider may recommend certain vaccines, such as: Influenza vaccine. This is recommended every year. Tetanus, diphtheria, and acellular pertussis (Tdap, Td) vaccine. You may need a Td booster every 10 years. Zoster vaccine. You may need this after age 85. Pneumococcal 13-valent conjugate (PCV13) vaccine. One dose is recommended after age 85. Pneumococcal polysaccharide (PPSV23) vaccine. One dose is recommended  after age 85. Talk to your health care provider about which screenings and vaccines you need and how often you need them. This information is not intended to replace advice given to you by your health care provider. Make sure you discuss any questions you have with your health care provider. Document Released: 08/24/2015 Document Revised: 04/16/2016 Document Reviewed: 05/29/2015 Elsevier Interactive Patient Education  2017 [employer] Prevention in the Home Falls can cause injuries. They can happen to people of all ages. There are many things you can do to make your home safe and to help prevent falls. What can I do on the outside of my home? Regularly fix the edges of walkways and driveways and fix any cracks. Remove anything that might make you trip as you walk through a door, such as a raised step or threshold. Trim any bushes or trees on the path to your home. Use bright outdoor lighting. Clear any walking paths of anything that might make someone trip, such as rocks or tools. Regularly check to see if handrails are loose or broken. Make sure that both sides of any steps have handrails. Any raised decks and porches should have guardrails on the edges. Have any leaves, snow, or ice cleared regularly. Use sand or salt on walking paths during winter. Clean up any spills in your garage right away. This includes oil or grease spills. What can I do in the bathroom? Use night lights. Install grab bars by the toilet and in the tub and shower. Do not use towel bars as grab bars. Use non-skid mats or decals in the tub or shower. If you need to sit down in the shower, use a plastic, non-slip stool. Keep the floor dry. Clean up any water that spills on the floor as soon as it happens. Remove soap buildup in the tub or shower regularly. Attach bath mats securely with double-sided non-slip rug tape. Do not have throw rugs and other things on the floor that can make you trip. What can I do  in the bedroom? Use night lights. Make sure that you have a light by your bed that is easy to reach. Do not use any sheets or blankets that are too big for your bed. They should not hang down onto the floor. Have a firm chair that has side arms. You can use this for support while you get dressed. Do not have throw rugs and other things on the floor that can make you trip. What can I do in the kitchen? Clean up any spills right away. Avoid walking on wet floors. Keep items that you use a lot in easy-to-reach places. If you need to reach something above you, use a strong step stool that has a grab bar. Keep electrical cords out of the way. Do not use floor polish or wax that makes floors slippery. If you must use wax, use non-skid floor wax. Do not have throw rugs and other things on the floor that can make you trip. What can I do with my stairs? Do not leave any items on the stairs. Make sure that there are handrails on both sides of the stairs and use them. Fix handrails that are broken or loose. Make sure that handrails are as long as the stairways. Check any carpeting to make sure that it is firmly attached to the stairs. Fix any carpet that is loose or worn. Avoid having throw rugs at the top or bottom of the stairs. If you do  have throw rugs, attach them to the floor with carpet tape. Make sure that you have a light switch at the top of the stairs and the bottom of the stairs. If you do not have them, ask someone to add them for you. What else can I do to help prevent falls? Wear shoes that: Do not have high heels. Have rubber bottoms. Are comfortable and fit you well. Are closed at the toe. Do not wear sandals. If you use a stepladder: Make sure that it is fully opened. Do not climb a closed stepladder. Make sure that both sides of the stepladder are locked into place. Ask someone to hold it for you, if possible. Clearly mark and make sure that you can see: Any grab bars or  handrails. First and last steps. Where the edge of each step is. Use tools that help you move around (mobility aids) if they are needed. These include: Canes. Walkers. Scooters. Crutches. Turn on the lights when you go into a dark area. Replace any light bulbs as soon as they burn out. Set up your furniture so you have a clear path. Avoid moving your furniture around. If any of your floors are uneven, fix them. If there are any pets around you, be aware of where they are. Review your medicines with your doctor. Some medicines can make you feel dizzy. This can increase your chance of falling. Ask your doctor what other things that you can do to help prevent falls. This information is not intended to replace advice given to you by your health care provider. Make sure you discuss any questions you have with your health care provider. Document Released: 05/24/2009 Document Revised: 01/03/2016 Document Reviewed: 09/01/2014 Elsevier Interactive Patient Education  2017 [employer].

## 2021-05-15 NOTE — Progress Notes (Addendum)
Subjective:   Kathleen Thompson is a 87 y.o. female who presents for Medicare Annual (Subsequent) preventive examination.  I connected with Kathleen Thompson today by telephone and verified that I am speaking with the correct person using two identifiers. Location patient: home Location provider: work Persons participating in the virtual visit: patient, Marine scientist.    I discussed the limitations, risks, security and privacy concerns of performing an evaluation and management service by telephone and the availability of in person appointments. I also discussed with the patient that there may be a patient responsible charge related to this service. The patient expressed understanding and verbally consented to this telephonic visit.    Interactive audio and video telecommunications were attempted between this provider and patient, however failed, due to patient having technical difficulties OR patient did not have access to video capability.  We continued and completed visit with audio only.  Some vital signs may be absent or patient reported.   Time Spent with patient on telephone encounter: 20 minutes   Review of Systems     Cardiac Risk Factors include: advanced age (>33men, >48 women);diabetes mellitus;dyslipidemia;hypertension     Objective:    Today's Vitals   05/15/21 1504  Weight: 160 lb (72.6 kg)  Height: 5\' 2"  (1.575 m)   Body mass index is 29.26 kg/m.  Advanced Directives 05/15/2021 02/27/2020 02/10/2020 06/12/2018 03/14/2013 03/14/2013 03/10/2013  Does Patient Have a Medical Advance Directive? No No No No Patient does not have advance directive;Patient would not like information - Patient does not have advance directive;Patient would not like information  Would patient like information on creating a medical advance directive? Yes (MAU/Ambulatory/Procedural Areas - Information given) Yes (ED - Information included in AVS) - - - - -  Pre-existing out of facility DNR order (yellow form or pink  MOST form) - - - - No No -    Current Medications (verified) Outpatient Encounter Medications as of 05/15/2021  Medication Sig   aspirin 81 MG tablet Take 81 mg by mouth daily.   CALCIUM PO Take 1 tablet by mouth 2 (two) times daily.   ciclopirox (PENLAC) 8 % solution Apply topically at bedtime. Apply over nail and surrounding skin. Apply daily over previous coat. After seven (7) days, may remove with alcohol and continue cycle.   clopidogrel (PLAVIX) 75 MG tablet Take 1 tablet (75 mg total) by mouth daily.   Coenzyme Q10 (CO Q-10 PO) Take 1 tablet by mouth daily.   isosorbide mononitrate (IMDUR) 30 MG 24 hr tablet Take 1 tablet by mouth once daily   levothyroxine (SYNTHROID) 50 MCG tablet Take 1 tablet (50 mcg total) by mouth daily before breakfast.   losartan-hydrochlorothiazide (HYZAAR) 100-12.5 MG tablet Take 0.5 tablets by mouth daily.   magnesium 30 MG tablet Take 30 mg by mouth daily.   pantoprazole (PROTONIX) 40 MG tablet Take 1 tablet (40 mg total) by mouth daily before breakfast.   rosuvastatin (CRESTOR) 20 MG tablet Take 1 tablet (20 mg total) by mouth daily.   traMADol (ULTRAM) 50 MG tablet TAKE 1 TABLET BY MOUTH EVERY 12 HOURS AS NEEDED   vitamin D, CHOLECALCIFEROL, 400 UNITS tablet Take 400 Units by mouth daily.   ondansetron (ZOFRAN) 8 MG tablet Take 1 tablet (8 mg total) by mouth every 8 (eight) hours as needed for nausea or vomiting. (Patient not taking: No sig reported)   No facility-administered encounter medications on file as of 05/15/2021.    Allergies (verified) Clindamycin/lincomycin, Cymbalta [duloxetine hcl], Hydrocodone, Rofecoxib,  and Latex   History: Past Medical History:  Diagnosis Date   Adenomatous colon polyp 02/2003   Arthritis    Blood transfusion    Cataract    Diverticulosis    Esophageal stricture    Gastroparesis    GERD (gastroesophageal reflux disease)    Headache    Hyperlipidemia    Hypertension    Hypothyroidism    Internal hemorrhoid     Iron deficiency anemia    Osteoporosis    PONV (postoperative nausea and vomiting)    PVD (peripheral vascular disease) (HCC)    Stroke (HCC)    TIA   TIA (transient ischemic attack)    Past Surgical History:  Procedure Laterality Date   ABDOMINAL HYSTERECTOMY     APPENDECTOMY     CERVICAL FUSION     EYE SURGERY Bilateral 08/2016   cataracts   LEFT HEART CATHETERIZATION WITH CORONARY ANGIOGRAM N/A 12/20/2012   Procedure: LEFT HEART CATHETERIZATION WITH CORONARY ANGIOGRAM;  Surgeon: Peter M Martinique, MD;  Location: [facility] CATH LAB;  Service: Cardiovascular;  Laterality: N/A;   shoulder surgery      VIDEO ASSISTED THORACOSCOPY (VATS)/WEDGE RESECTION Right 03/14/2013   Procedure: VIDEO ASSISTED THORACOSCOPY (VATS)/WEDGE RESECTION;  Surgeon: Melrose Nakayama, MD;  Location: [facility];  Service: Thoracic;  Laterality: Right;   Family History  Problem Relation Age of Onset   Diabetes Mother    Heart attack Mother    Heart attack Father    Asthma Father    Colon cancer Brother        at age 49   Colon cancer Sister        at age 60   Diabetes Maternal Aunt    Diabetes Maternal Uncle    Heart attack Sister    Heart attack Brother    Heart attack Brother    Heart attack Brother    Heart attack Brother    Heart attack Sister    Breast cancer Neg Hx    Social History   Socioeconomic History   Marital status: Married    Spouse name: Not on file   Number of children: 2   Years of education: 11th grade   Highest education level: Not on file  Occupational History   Occupation: Retired-[employer]     Employer: RETIRED  Tobacco Use   Smoking status: Never   Smokeless tobacco: Never  Substance and Sexual Activity   Alcohol use: No   Drug use: No   Sexual activity: Yes  Other Topics Concern   Not on file  Social History Narrative   Household-- pt and wife   2 boys , one in [place], on in [place]    No surviving sibling out of seven.   One cup coffee with 1/2 caffeine.   Right-handed.    Social Determinants of Health   Financial Resource Strain: Low Risk    Difficulty of Paying Living Expenses: Not hard at all  Food Insecurity: No Food Insecurity   Worried About Charity fundraiser in the Last Year: Never true   [place] in the Last Year: Never true  Transportation Needs: No Transportation Needs   Lack of Transportation (Medical): No   Lack of Transportation (Non-Medical): No  Physical Activity: Inactive   Days of Exercise per Week: 0 days   Minutes of Exercise per Session: 0 min  Stress: No Stress Concern Present   Feeling of Stress : Not at all  Social Connections: Moderately Isolated  Frequency of Communication with Friends and Family: More than three times a week   Frequency of Social Gatherings with Friends and Family: More than three times a week   Attends Religious Services: Never   Marine scientist or Organizations: No   Attends Music therapist: Never   Marital Status: Married    Tobacco Counseling Counseling given: Not Answered   Clinical Intake:  Pre-visit preparation completed: Yes  Pain : No/denies pain     BMI - recorded: 29.26 Nutritional Status: BMI 25 -29 Overweight Nutritional Risks: None Diabetes: Yes CBG done?: No Did pt. bring in CBG monitor from home?: No (phone visit)  How often do you need to have someone help you when you read instructions, pamphlets, or other written materials from your doctor or pharmacy?: 1 - Never Diabetes:  Is the patient diabetic?  Yes  If diabetic, was a CBG obtained today?  No  Did the patient bring in their glucometer from home?  No phone visit How often do you monitor your CBG's? never.   Financial Strains and Diabetes Management:  Are you having any financial strains with the device, your supplies or your medication? No .  Does the patient want to be seen by Chronic Care Management for management of their diabetes?  No  Would the patient like to be referred to a  Nutritionist or for Diabetic Management?  No   Diabetic Exams:  Diabetic Eye Exam: Overdue for diabetic eye exam. Pt has been advised about the importance in completing this exam. Patient plans to make an appt.  Diabetic Foot Exam: Completed 09/04/2020.   Interpreter Needed?: No  Information entered by :: Caroleen Hamman LPN   Activities of Daily Living In your present state of health, do you have any difficulty performing the following activities: 05/15/2021 09/04/2020  Hearing? N N  Vision? N N  Difficulty concentrating or making decisions? N N  Walking or climbing stairs? N N  Dressing or bathing? N N  Doing errands, shopping? N N  Preparing Food and eating ? N -  Using the Toilet? N -  In the past six months, have you accidently leaked urine? N -  Do you have problems with loss of bowel control? N -  Managing your Medications? N -  Managing your Finances? N -  Housekeeping or managing your Housekeeping? N -  Some recent data might be hidden    Patient Care Team: Colon Branch, MD as PCP - General (Internal Medicine) Lia Foyer, NP as Nurse Practitioner (Nurse Practitioner) Marybelle Killings, MD as Consulting Physician (Orthopedic Surgery) Gavin Pound, MD as Consulting Physician (Rheumatology)  Indicate any recent Medical Services you may have received from other than [facility] providers in the past year (date may be approximate).     Assessment:   This is a routine wellness examination for [place].  Hearing/Vision screen Hearing Screening - Comments:: No issues Vision Screening - Comments:: Reading glasses &amp; for TV Last eye exam-02/2020-[facility]  Dietary issues and exercise activities discussed: Current Exercise Habits: The patient does not participate in regular exercise at present, Exercise limited by: None identified   Goals Addressed             This Visit's Progress    Patient Stated       Maintain current health       Depression  Screen PHQ 2/9 Scores 05/15/2021 01/08/2021 09/04/2020 05/19/2019 07/06/2017 06/20/2016 05/21/2015  PHQ - 2 Score 0 0  0 0 0 0 0    Fall Risk Fall Risk  05/15/2021 01/08/2021 09/04/2020 05/19/2019 07/06/2017  Falls in the past year? 0 0 1 0 No  Number falls in past yr: 0 0 0 - -  Injury with Fall? 0 0 0 - -  Risk for fall due to : - - - - -  Follow up Falls prevention discussed - - Falls evaluation completed -    FALL RISK PREVENTION PERTAINING TO THE HOME:  Any stairs in or around the home? No  Home free of loose throw rugs in walkways, pet beds, electrical cords, etc? Yes  Adequate lighting in your home to reduce risk of falls? Yes   ASSISTIVE DEVICES UTILIZED TO PREVENT FALLS:  Life alert? No  Use of a cane, walker or w/c? No  Grab bars in the bathroom? No  Shower chair or bench in shower? No  Elevated toilet seat or a handicapped toilet? No   TIMED UP AND GO:  Was the test performed? No . Phone visit   Cognitive Function:Normal cognitive status assessed by this Nurse Health Advisor. No abnormalities found.          Immunizations Immunization History  Administered Date(s) Administered   Fluad Quad(high Dose 65+) 04/12/2019, 05/10/2020, 04/25/2021   Influenza Split 05/11/2012, 05/08/2015, 04/15/2018   Influenza Whole 05/11/2009   Influenza, High Dose Seasonal PF 05/08/2014   Influenza-Unspecified 05/11/2013, 04/26/2016, 05/07/2017   PFIZER(Purple Top)SARS-COV-2 Vaccination 09/15/2019, 10/11/2019, 05/29/2020   Pneumococcal Conjugate-13 10/21/2013   Pneumococcal Polysaccharide-23 02/18/2010, 09/04/2020   Td 02/18/2010   Zoster Recombinat (Shingrix) 03/13/2018, 05/13/2018   Zoster, Live 02/26/2011    TDAP status: Due, Education has been provided regarding the importance of this vaccine. Advised may receive this vaccine at local pharmacy or Health Dept. Aware to provide a copy of the vaccination record if obtained from local pharmacy or Health Dept. Verbalized acceptance  and understanding.  Flu Vaccine status: Up to date  Pneumococcal vaccine status: Up to date  Covid-19 vaccine status: Declined, Education has been provided regarding the importance of this vaccine but patient still declined. Advised may receive this vaccine at local pharmacy or Health Dept.or vaccine clinic. Aware to provide a copy of the vaccination record if obtained from local pharmacy or Health Dept. Verbalized acceptance and understanding.Declined booster  Qualifies for Shingles Vaccine? No   Zostavax completed Yes   Shingrix Completed?: Yes  Screening Tests Health Maintenance  Topic Date Due   TETANUS/TDAP  02/19/2020   COVID-19 Vaccine (4 - Booster for Pfizer series) 08/21/2020   OPHTHALMOLOGY EXAM  02/19/2021   HEMOGLOBIN A1C  07/10/2021   FOOT EXAM  09/04/2021   INFLUENZA VACCINE  Completed   DEXA SCAN  Completed   Zoster Vaccines- Shingrix  Completed   HPV VACCINES  Aged Out    Health Maintenance  Health Maintenance Due  Topic Date Due   TETANUS/TDAP  02/19/2020   COVID-19 Vaccine (4 - Booster for Pfizer series) 08/21/2020   OPHTHALMOLOGY EXAM  02/19/2021    Colorectal cancer screening: No longer required.   Mammogram status: No longer required due to patient declined.  Bone Density status: Completed 10/11/2020. Results reflect: Bone density results: OSTEOPOROSIS. Repeat every 2 years.  Lung Cancer Screening: (Low Dose CT Chest recommended if Age 31-80 years, 30 pack-year currently smoking OR have quit w/in 15years.) does not qualify.     Additional Screening:  Hepatitis C Screening: does not qualify  Vision Screening: Recommended annual ophthalmology exams for early  detection of glaucoma and other disorders of the eye. Is the patient up to date with their annual eye exam?  No  Who is the provider or what is the name of the office in which the patient attends annual eye exams? [facility]   Dental Screening: Recommended annual dental exams for  proper oral hygiene  Community Resource Referral / Chronic Care Management: CRR required this visit?  No   CCM required this visit?  No      Plan:     I have personally reviewed and noted the following in the patient's chart:   Medical and social history Use of alcohol, tobacco or illicit drugs  Current medications and supplements including opioid prescriptions.  Functional ability and status Nutritional status Physical activity Advanced directives List of other physicians Hospitalizations, surgeries, and ER visits in previous 12 months Vitals Screenings to include cognitive, depression, and falls Referrals and appointments  In addition, I have reviewed and discussed with patient certain preventive protocols, quality metrics, and best practice recommendations. A written personalized care plan for preventive services as well as general preventive health recommendations were provided to patient.   Due to this being a telephonic visit, the after visit summary with patients personalized plan was offered to patient via mail or my-chart. Patient would like to access on my-chart.   Marta Antu, LPN   10/0/7121  Nurse Health Advisor  Nurse Notes: None  I have reviewed and agree with Health Coaches documentation.  Kathlene November, MD

## 2021-05-29 ENCOUNTER — Telehealth: Payer: Self-pay | Admitting: Internal Medicine

## 2021-05-29 NOTE — Telephone Encounter (Signed)
Unable to review PDMP but seems appropriate to RF.  Prescription sent

## 2021-05-29 NOTE — Telephone Encounter (Signed)
Requesting: tramadol 50mg   Contract: 09/04/20 UDS: 09/04/20 Last Visit: 01/08/2021 Next Visit: 09/10/2021 Last Refill: 03/01/2021 #180 and 0RF  Please Advise

## 2021-06-12 ENCOUNTER — Telehealth: Payer: Self-pay

## 2021-06-12 NOTE — Telephone Encounter (Signed)
Entered in error

## 2021-06-18 ENCOUNTER — Ambulatory Visit: Payer: Medicare PPO | Admitting: Podiatry

## 2021-07-10 ENCOUNTER — Other Ambulatory Visit: Payer: Self-pay | Admitting: Internal Medicine

## 2021-08-22 ENCOUNTER — Other Ambulatory Visit: Payer: Self-pay | Admitting: Internal Medicine

## 2021-08-31 ENCOUNTER — Telehealth: Payer: Self-pay | Admitting: Internal Medicine

## 2021-09-02 NOTE — Telephone Encounter (Signed)
Requesting: tramadol 50mg   Contract: 09/04/2020 UDS: 09/04/2020 Last Visit: 01/08/2021 Next Visit: 09/10/2021 Last Refill: 05/29/2021 #180 and 0RF  Please Advise

## 2021-09-02 NOTE — Telephone Encounter (Signed)
PDMP okay, prescription sent.  She has an appointment in the next few days.

## 2021-09-10 ENCOUNTER — Ambulatory Visit (INDEPENDENT_AMBULATORY_CARE_PROVIDER_SITE_OTHER): Payer: Medicare PPO | Admitting: Internal Medicine

## 2021-09-10 ENCOUNTER — Encounter: Payer: Self-pay | Admitting: Internal Medicine

## 2021-09-10 VITALS — BP 128/84 | HR 86 | Temp 98.2°F | Resp 18 | Ht 62.0 in | Wt 158.2 lb

## 2021-09-10 DIAGNOSIS — E039 Hypothyroidism, unspecified: Secondary | ICD-10-CM | POA: Diagnosis not present

## 2021-09-10 DIAGNOSIS — E119 Type 2 diabetes mellitus without complications: Secondary | ICD-10-CM | POA: Diagnosis not present

## 2021-09-10 DIAGNOSIS — Z79899 Other long term (current) drug therapy: Secondary | ICD-10-CM

## 2021-09-10 DIAGNOSIS — Z113 Encounter for screening for infections with a predominantly sexual mode of transmission: Secondary | ICD-10-CM | POA: Diagnosis not present

## 2021-09-10 DIAGNOSIS — G629 Polyneuropathy, unspecified: Secondary | ICD-10-CM | POA: Diagnosis not present

## 2021-09-10 DIAGNOSIS — I10 Essential (primary) hypertension: Secondary | ICD-10-CM

## 2021-09-10 DIAGNOSIS — M159 Polyosteoarthritis, unspecified: Secondary | ICD-10-CM

## 2021-09-10 DIAGNOSIS — Z0000 Encounter for general adult medical examination without abnormal findings: Secondary | ICD-10-CM | POA: Diagnosis not present

## 2021-09-10 DIAGNOSIS — Z0001 Encounter for general adult medical examination with abnormal findings: Secondary | ICD-10-CM

## 2021-09-10 DIAGNOSIS — E7849 Other hyperlipidemia: Secondary | ICD-10-CM

## 2021-09-10 MED ORDER — GABAPENTIN 100 MG PO CAPS: 100.0000 mg | ORAL_CAPSULE | Freq: Three times a day (TID) | ORAL | 3 refills | Status: DC

## 2021-09-10 NOTE — Assessment & Plan Note (Signed)
Here for CPX DM: Diet controlled, check A1c. Neuropathy: New issue.   Sxs started around 6 months ago, as described above, probably related to diabetes noting that the onset has been kind of abrupt thus consider neuro eval in the fututre. In addition to routine labs we will get a P32, folic acid, SPEP, ANA, sed rate, RPR. Trial will gabapentin, low-dose, watch for drowsiness, titrate up if needed.  Reassess in 3 months. HTN: Seems well controlled.  Continue Hyzaar. Hypothyroidism: On Synthroid, check TSH Hyperlipidemia: On Crestor, checking labs CAD: On aspirin, Plavix, asymptomatic. Dystrophic nail: L great toe nail seems dystrophic, she is applying Penlac, area looks quite irritated.  Rec to stop Penlac, OTC throat: Hydrocortisone 1% as needed, will reassess in 3 months. DJD: Currently on Ultram, states that she is intolerant to Tylenol ("make me nervous"). RTC 3 months

## 2021-09-10 NOTE — Patient Instructions (Addendum)
Per our records you are due for your diabetic eye exam. Please contact your eye doctor to schedule an appointment. Please have them send copies of your office visit notes to [place]. Our fax number is [phone]. If you need a referral to an eye doctor please let us know.  For neuropathy try gabapentin 100 mg 1 tablet 3 times a day. Is a low dose, we can increase the dose if needed Watch for drowsiness   Vaccines are recommended Tdap (tetanus booster) COVID booster  Do not apply Penlac to your toenail.  Okay to use OTC hydrocortisone 1% cream for the next month   GO TO THE LAB : Get the blood work     [place], [place] back for a checkup in 3 months   Recommend to bring a copy of your living will

## 2021-09-10 NOTE — Progress Notes (Signed)
Subjective:    Patient ID: Kathleen Thompson, female    DOB: 1934/06/03, 88 y.o.   MRN: [id number]  DOS:  09/10/2021 Type of visit - description: cpx  Here for CPX In addition we discussed her chronic medical problems and a couple of new issues.  Developed bilateral feet numbness for the last 5 to 6 months. Gradually getting worse Worse at night. Symptoms are in a sock distribution. No claudication per se, does have DJD with pains at different places including hips, knees and back.  Back pain is at baseline  Also, saw a podiatrist, nails were dystrophic, was rx  used Penlac for the right great toe. 2 months ago the L great toenail "cracked" and she started to use  Penlac there, now the area looks irritated.    Review of Systems  Other than above, a 14 point review of systems is negative       Past Medical History:  Diagnosis Date   Adenomatous colon polyp 02/2003   Arthritis    Blood transfusion    Cataract    Diverticulosis    Esophageal stricture    Gastroparesis    GERD (gastroesophageal reflux disease)    Headache    Hyperlipidemia    Hypertension    Hypothyroidism    Internal hemorrhoid    Iron deficiency anemia    Osteoporosis    PONV (postoperative nausea and vomiting)    PVD (peripheral vascular disease) (HCC)    Stroke (HCC)    TIA   TIA (transient ischemic attack)     Past Surgical History:  Procedure Laterality Date   ABDOMINAL HYSTERECTOMY     APPENDECTOMY     CERVICAL FUSION     EYE SURGERY Bilateral 08/2016   cataracts   LEFT HEART CATHETERIZATION WITH CORONARY ANGIOGRAM N/A 12/20/2012   Procedure: LEFT HEART CATHETERIZATION WITH CORONARY ANGIOGRAM;  Surgeon: Peter M Martinique, MD;  Location: [facility] CATH LAB;  Service: Cardiovascular;  Laterality: N/A;   shoulder surgery      VIDEO ASSISTED THORACOSCOPY (VATS)/WEDGE RESECTION Right 03/14/2013   Procedure: VIDEO ASSISTED THORACOSCOPY (VATS)/WEDGE RESECTION;  Surgeon: Melrose Nakayama, MD;  Location:  [facility] OR;  Service: Thoracic;  Laterality: Right;   Social History   Socioeconomic History   Marital status: Married    Spouse name: Not on file   Number of children: 2   Years of education: 11th grade   Highest education level: Not on file  Occupational History   Occupation: Retired-[employer]     Employer: RETIRED  Tobacco Use   Smoking status: Never   Smokeless tobacco: Never  Substance and Sexual Activity   Alcohol use: No   Drug use: No   Sexual activity: Yes  Other Topics Concern   Not on file  Social History Narrative   Household-- pt and wife   2 boys , one in [place], on in [place]    No surviving sibling out of seven.   One cup coffee with 1/2 caffeine.   Right-handed.   Social Determinants of Health   Financial Resource Strain: Low Risk    Difficulty of Paying Living Expenses: Not hard at all  Food Insecurity: No Food Insecurity   Worried About Charity fundraiser in the Last Year: Never true   [place] in the Last Year: Never true  Transportation Needs: No Transportation Needs   Lack of Transportation (Medical): No   Lack of Transportation (Non-Medical): No  Physical Activity: Inactive  Days of Exercise per Week: 0 days   Minutes of Exercise per Session: 0 min  Stress: No Stress Concern Present   Feeling of Stress : Not at all  Social Connections: Moderately Isolated   Frequency of Communication with Friends and Family: More than three times a week   Frequency of Social Gatherings with Friends and Family: More than three times a week   Attends Religious Services: Never   Marine scientist or Organizations: No   Attends Music therapist: Never   Marital Status: Married  Human resources officer Violence: Not At Risk   Fear of Current or Ex-Partner: No   Emotionally Abused: No   Physically Abused: No   Sexually Abused: No     Current Outpatient Medications  Medication Instructions   aspirin 81 mg, Daily   CALCIUM PO 1 tablet, 2 times daily    clopidogrel (PLAVIX) 75 mg, Oral, Daily   Coenzyme Q10 (CO Q-10 PO) 1 tablet, Daily   gabapentin (NEURONTIN) 100 mg, Oral, 3 times daily   isosorbide mononitrate (IMDUR) 30 MG 24 hr tablet Take 1 tablet by mouth once daily   levothyroxine (SYNTHROID) 50 mcg, Oral, Daily before breakfast   losartan-hydrochlorothiazide (HYZAAR) 100-12.5 MG tablet Take 1/2 (one-half) tablet by mouth once daily   magnesium 30 mg, Oral, Daily,     ondansetron (ZOFRAN) 8 mg, Oral, Every 8 hours PRN   pantoprazole (PROTONIX) 40 mg, Oral, Daily before breakfast   rosuvastatin (CRESTOR) 20 MG tablet Take 1 tablet by mouth once daily   traMADol (ULTRAM) 50 mg, Oral, Every 12 hours PRN   vitamin D (CHOLECALCIFEROL) 400 Units, Oral, Daily,         Objective:   Physical Exam BP 128/84 (BP Location: Left Arm, Patient Position: Sitting, Cuff Size: Small)    Pulse 86    Temp 98.2 F (36.8 C) (Oral)    Resp 18    Ht 5\' 2"  (1.575 m)    Wt 158 lb 4 oz (71.8 kg)    SpO2 97%    BMI 28.94 kg/m  General: Well developed, NAD, BMI noted Neck: No  thyromegaly  HEENT:  Normocephalic . Face symmetric, atraumatic Lungs:  CTA B Normal respiratory effort, no intercostal retractions, no accessory muscle use. Heart: RRR,  no murmur.  Abdomen:  Not distended, soft, non-tender. No rebound or rigidity.   Lower extremities: + PERI ankle edema. Good pedal pulses L great toe: is dystrophic, skin and cuticle are normal in color. Pinprick examination: Normal Skin: Exposed areas without rash. Not pale. Not jaundice Neurologic:  alert & oriented X3.  Speech normal, gait appropriate for age and unassisted Strength symmetric and appropriate for age. DTRs lower extremities symmetric, both ankle jerks are diminished Psych: Cognition and judgment appear intact.  Cooperative with normal attention span and concentration.  Behavior appropriate. No anxious or depressed appearing.     Assessment      Assessment DM: DX 05-2019, A1c  6.6.  HTN Hypothyroidism Hyperlipidemia CV: --CAD cath 12/2012 single-vessel CAD,rx CV RF control, Rx Plavix, imdur --TIA remotely , apparently amaurosis fugax, was a started on on plavix per PCP, no further sx since. --On ASA and Plavix: Discuss 11/17/2016 Osteoporosis -  DEXAs @ [facility], T score -3.9 (12/2015).Prolia #1:  07/2016 MSK: Dr Lorin Mercy --DJD- s/p neck and shoulder surgeries ; + DJD cervical spine --knee pain: on tramadol rx per pcp GI: GED, gastroparesis, diverticulosis, esophageal stricture H/o Iron deficiency anemia H/o granuloma RUL, resected 03-14-13  PLAN: Here for CPX DM: Diet controlled, check A1c. Neuropathy: New issue.   Sxs started around 6 months ago, as described above, probably related to diabetes noting that the onset has been kind of abrupt thus consider neuro eval in the fututre. In addition to routine labs we will get a Y63, folic acid, SPEP, ANA, sed rate, RPR. Trial will gabapentin, low-dose, watch for drowsiness, titrate up if needed.  Reassess in 3 months. HTN: Seems well controlled.  Continue Hyzaar. Hypothyroidism: On Synthroid, check TSH Hyperlipidemia: On Crestor, checking labs CAD: On aspirin, Plavix, asymptomatic. Dystrophic nail: L great toe nail seems dystrophic, she is applying Penlac, area looks quite irritated.  Rec to stop Penlac, OTC throat: Hydrocortisone 1% as needed, will reassess in 3 months. DJD: Currently on Ultram, states that she is intolerant to Tylenol ("make me nervous"). RTC 3 months     In addition to CPX we addressed her chronic medical problems and neuropathy, new onset. This visit occurred during the SARS-CoV-2 public health emergency.  Safety protocols were in place, including screening questions prior to the visit, additional usage of staff PPE, and extensive cleaning of exam room while observing appropriate contact time as indicated for disinfecting solutions.

## 2021-09-10 NOTE — Assessment & Plan Note (Signed)
--  Tdap: 2011, Tdap rec  - PNA 23: 2011, 2022 - PNM 13: 2015  - s/p zostavax 02/26/11;  s/p shingrix   -COVID vaccine x3, booster rec  -  s/p flu shot   --No further paps or mammograms. See previous entries.  --CCS: 2 siblings w/ colon ca; today she tells me she is not interested on further CCS -Patient education:  advance care planning information provided - Labs: CMP, FLP, CBC, Y5U, TSH, D43, folic acid, ANA, sed rate, SPEP,  RPR

## 2021-09-11 LAB — TSH: TSH: 1.88 u[IU]/mL (ref 0.35–5.50)

## 2021-09-11 LAB — B12 AND FOLATE PANEL
Folate: 12 ng/mL (ref >5.9)
Vitamin B-12: >1504 pg/mL — ABNORMAL HIGH (ref 211–911)

## 2021-09-11 LAB — DRUG MONITORING PANEL 375977 , URINE
Alcohol Metabolites: NEGATIVE ng/mL (ref <500)
Amphetamines: NEGATIVE ng/mL (ref <500)
Barbiturates: NEGATIVE ng/mL (ref <300)
Benzodiazepines: NEGATIVE ng/mL (ref <100)
Cocaine Metabolite: NEGATIVE ng/mL (ref <150)
Desmethyltramadol: 1771 ng/mL — ABNORMAL HIGH (ref <100)
Marijuana Metabolite: NEGATIVE ng/mL (ref <20)
Opiates: NEGATIVE ng/mL (ref <100)
Oxycodone: NEGATIVE ng/mL (ref <100)
Tramadol: 6262 ng/mL — ABNORMAL HIGH (ref <100)

## 2021-09-11 LAB — COMPREHENSIVE METABOLIC PANEL
ALT: 13 U/L (ref 0–35)
AST: 21 U/L (ref 0–37)
Albumin: 4.5 g/dL (ref 3.5–5.2)
Alkaline Phosphatase: 61 U/L (ref 39–117)
BUN: 8 mg/dL (ref 6–23)
CO2: 28 mEq/L (ref 19–32)
Calcium: 9.2 mg/dL (ref 8.4–10.5)
Chloride: 101 mEq/L (ref 96–112)
Creatinine, Ser: 0.9 mg/dL (ref 0.40–1.20)
GFR: 58.29 mL/min — ABNORMAL LOW (ref >60.00)
Glucose, Bld: 102 mg/dL — ABNORMAL HIGH (ref 70–99)
Potassium: 4 mEq/L (ref 3.5–5.1)
Sodium: 137 mEq/L (ref 135–145)
Total Bilirubin: 1 mg/dL (ref 0.2–1.2)
Total Protein: 7.1 g/dL (ref 6.0–8.3)

## 2021-09-11 LAB — LIPID PANEL
Cholesterol: 192 mg/dL (ref 0–200)
HDL: 69.3 mg/dL (ref >39.00)
LDL Cholesterol: 97 mg/dL (ref 0–99)
NonHDL: 122.6
Total CHOL/HDL Ratio: 3
Triglycerides: 128 mg/dL (ref 0.0–149.0)
VLDL: 25.6 mg/dL (ref 0.0–40.0)

## 2021-09-11 LAB — CBC WITH DIFFERENTIAL/PLATELET
Basophils Absolute: 0.1 10*3/uL (ref 0.0–0.1)
Basophils Relative: 1.2 % (ref 0.0–3.0)
Eosinophils Absolute: 0.1 10*3/uL (ref 0.0–0.7)
Eosinophils Relative: 0.8 % (ref 0.0–5.0)
HCT: 42.2 % (ref 36.0–46.0)
Hemoglobin: 14.2 g/dL (ref 12.0–15.0)
Lymphocytes Relative: 27 % (ref 12.0–46.0)
Lymphs Abs: 1.7 10*3/uL (ref 0.7–4.0)
MCHC: 33.7 g/dL (ref 30.0–36.0)
MCV: 94 fl (ref 78.0–100.0)
Monocytes Absolute: 0.4 10*3/uL (ref 0.1–1.0)
Monocytes Relative: 6.2 % (ref 3.0–12.0)
Neutro Abs: 4.2 10*3/uL (ref 1.4–7.7)
Neutrophils Relative %: 64.8 % (ref 43.0–77.0)
Platelets: 260 10*3/uL (ref 150.0–400.0)
RBC: 4.49 Mil/uL (ref 3.87–5.11)
RDW: 13.5 % (ref 11.5–15.5)
WBC: 6.4 10*3/uL (ref 4.0–10.5)

## 2021-09-11 LAB — HEMOGLOBIN A1C: Hgb A1c MFr Bld: 6.4 % (ref 4.6–6.5)

## 2021-09-11 LAB — DM TEMPLATE

## 2021-09-11 LAB — SEDIMENTATION RATE: Sed Rate: 20 mm/hr (ref 0–30)

## 2021-09-12 MED ORDER — ROSUVASTATIN CALCIUM 40 MG PO TABS: 40.0000 mg | ORAL_TABLET | Freq: Every day | ORAL | 0 refills | Status: DC

## 2021-09-12 NOTE — Addendum Note (Signed)
Addended byDamita Dunnings D on: 09/12/2021 08:58 AM   Modules accepted: Orders

## 2021-09-13 LAB — PROTEIN ELECTROPHORESIS, SERUM
Albumin ELP: 4.3 g/dL (ref 3.8–4.8)
Alpha 1: 0.3 g/dL (ref 0.2–0.3)
Alpha 2: 0.8 g/dL (ref 0.5–0.9)
Beta 2: 0.5 g/dL (ref 0.2–0.5)
Beta Globulin: 0.4 g/dL (ref 0.4–0.6)
Gamma Globulin: 0.8 g/dL (ref 0.8–1.7)
Total Protein: 7.1 g/dL (ref 6.1–8.1)

## 2021-09-13 LAB — RPR: RPR Ser Ql: NONREACTIVE

## 2021-09-23 ENCOUNTER — Telehealth: Payer: Medicare PPO

## 2021-09-23 NOTE — Telephone Encounter (Signed)
Prolia VOB initiated via [email or web address]  Last OV:  Next OV:  Last Prolia inj: 04/25/21 Next Prolia inj DUE: 10/24/21

## 2021-10-07 NOTE — Telephone Encounter (Signed)
Prior auth required for PROLIA ? ?PA PROCESS DETAILS: PA is required. PA can be initiated by calling [phone] or online at ?[email or web address] ? ?

## 2021-10-16 NOTE — Telephone Encounter (Signed)
Prior auth initiated via [email or web address] ?KEY: [id number] ? ? ? ?

## 2021-10-17 NOTE — Telephone Encounter (Signed)
Prior auth APPROVED ° ° ° ° °

## 2021-10-19 NOTE — Telephone Encounter (Signed)
Pt ready for scheduling on or after 10/24/21 ? ?Out-of-pocket cost due at time of visit: $0 ? ?Primary: [employer] Medicare ?Prolia co-insurance: 0% ?Admin fee co-insurance: 0%  ? ?Secondary: n/a ?Prolia co-insurance:  ?Admin fee co-insurance:  ? ?Deductible: does not apply ? ?Prior Auth: APPROVED ?PA# [id number] ?Valid: 06/21/21-08/10/22 ?  ? ?** This summary of benefits is an estimation of the patient's out-of-pocket cost. Exact cost may very based on individual plan coverage.  ? ?

## 2021-10-21 ENCOUNTER — Other Ambulatory Visit: Payer: Self-pay | Admitting: Internal Medicine

## 2021-10-21 NOTE — Telephone Encounter (Signed)
Pt ready for scheduling on or after 10/24/21 ?  ?Out-of-pocket cost due at time of visit: $0 ?

## 2021-11-05 ENCOUNTER — Other Ambulatory Visit: Payer: Self-pay | Admitting: Internal Medicine

## 2021-12-02 ENCOUNTER — Telehealth: Payer: Self-pay | Admitting: Internal Medicine

## 2021-12-03 NOTE — Telephone Encounter (Signed)
PDMP okay, Rx sent, has an appointment for follow-up in few days. ?

## 2021-12-03 NOTE — Telephone Encounter (Signed)
Requesting: tramadol '50mg'$   ?Contract: 09/04/20 ?UDS: 09/10/21 ?Last Visit: 09/10/21 ?Next Visit: 12/10/21 ?Last Refill: 09/02/21 #180 and 0RF ? ?Please Advise ? ?

## 2021-12-10 ENCOUNTER — Encounter: Payer: Self-pay | Admitting: Internal Medicine

## 2021-12-10 ENCOUNTER — Ambulatory Visit: Payer: Medicare PPO | Admitting: Internal Medicine

## 2021-12-10 VITALS — BP 128/68 | HR 75 | Temp 98.0°F | Resp 12 | Ht 62.0 in | Wt 161.8 lb

## 2021-12-10 DIAGNOSIS — E039 Hypothyroidism, unspecified: Secondary | ICD-10-CM | POA: Diagnosis not present

## 2021-12-10 DIAGNOSIS — Z79899 Other long term (current) drug therapy: Secondary | ICD-10-CM

## 2021-12-10 DIAGNOSIS — E7849 Other hyperlipidemia: Secondary | ICD-10-CM

## 2021-12-10 DIAGNOSIS — E119 Type 2 diabetes mellitus without complications: Secondary | ICD-10-CM

## 2021-12-10 DIAGNOSIS — I10 Essential (primary) hypertension: Secondary | ICD-10-CM | POA: Diagnosis not present

## 2021-12-10 NOTE — Patient Instructions (Signed)
Check the  blood pressure regularly.  BP GOAL is between 110/65 and  135/85. If it is consistently higher or lower, let me know    GO TO THE LAB : Get the blood work     GO TO THE FRONT DESK, PLEASE SCHEDULE YOUR APPOINTMENTS Come back for a checkup in 6 months 

## 2021-12-10 NOTE — Progress Notes (Addendum)
Subjective:    Patient ID: Kathleen Thompson, female    DOB: June 09, 1934, 88 y.o.   MRN: [id number]  DOS:  12/10/2021 Type of visit - description: Follow-up  Follow-up from previous visit. Neuropathy: Tried  gabapentin, caused a headache, self d/cit.  Symptoms are actually mild at this time, no daily symptoms, better with walking or "cooling off" her feet. Taking vitamin B supplements.  Edema: At this dorsum of the foot and peri ankle areas, mostly at the end of the day, no daily swelling.  Denies chest pain or difficulty breathing.  No palpitations.    Review of Systems See above   Past Medical History:  Diagnosis Date   Adenomatous colon polyp 02/2003   Arthritis    Blood transfusion    Cataract    Diverticulosis    Esophageal stricture    Gastroparesis    GERD (gastroesophageal reflux disease)    Headache    Hyperlipidemia    Hypertension    Hypothyroidism    Internal hemorrhoid    Iron deficiency anemia    Osteoporosis    PONV (postoperative nausea and vomiting)    PVD (peripheral vascular disease) (HCC)    Stroke (HCC)    TIA   TIA (transient ischemic attack)     Past Surgical History:  Procedure Laterality Date   ABDOMINAL HYSTERECTOMY     APPENDECTOMY     CERVICAL FUSION     EYE SURGERY Bilateral 08/2016   cataracts   LEFT HEART CATHETERIZATION WITH CORONARY ANGIOGRAM N/A 12/20/2012   Procedure: LEFT HEART CATHETERIZATION WITH CORONARY ANGIOGRAM;  Surgeon: Peter M Martinique, MD;  Location: [facility] CATH LAB;  Service: Cardiovascular;  Laterality: N/A;   shoulder surgery      VIDEO ASSISTED THORACOSCOPY (VATS)/WEDGE RESECTION Right 03/14/2013   Procedure: VIDEO ASSISTED THORACOSCOPY (VATS)/WEDGE RESECTION;  Surgeon: Melrose Nakayama, MD;  Location: [place];  Service: Thoracic;  Laterality: Right;    Current Outpatient Medications  Medication Instructions   aspirin 81 mg, Daily   CALCIUM PO 1 tablet, 2 times daily   clopidogrel (PLAVIX) 75 MG tablet Take 1 tablet  by mouth once daily   Coenzyme Q10 (CO Q-10 PO) 1 tablet, Daily   isosorbide mononitrate (IMDUR) 30 MG 24 hr tablet Take 1 tablet by mouth once daily   levothyroxine (SYNTHROID) 50 mcg, Oral, Daily before breakfast   losartan-hydrochlorothiazide (HYZAAR) 100-12.5 MG tablet Take 1/2 (one-half) tablet by mouth once daily   magnesium 30 mg, Oral, Daily,     ondansetron (ZOFRAN) 8 mg, Oral, Every 8 hours PRN   pantoprazole (PROTONIX) 40 mg, Oral, Daily before breakfast   rosuvastatin (CRESTOR) 40 mg, Oral, Daily at bedtime   traMADol (ULTRAM) 50 MG tablet TAKE 1 TABLET BY MOUTH EVERY 12 HOURS AS NEEDED FOR MODERATE PAIN   vitamin D (CHOLECALCIFEROL) 400 Units, Oral, Daily,         Objective:   Physical Exam BP 128/68 (BP Location: Left Arm, Patient Position: Sitting, Cuff Size: Small)   Pulse 75   Temp 98 F (36.7 C) (Oral)   Resp 12   Ht '5\' 2"'$  (1.575 m)   Wt 161 lb 12.8 oz (73.4 kg)   SpO2 99%   BMI 29.59 kg/m  General:   Well developed, NAD, BMI noted. HEENT:  Normocephalic . Face symmetric, atraumatic Lungs:  CTA B Normal respiratory effort, no intercostal retractions, no accessory muscle use. Heart: RRR,  no murmur.  Lower extremities: + Peri ankle edema bilaterally, symmetric.  Skin: Not pale. Not jaundice Neurologic:  alert & oriented X3.  Speech normal, gait appropriate for age and unassisted Psych--  Cognition and judgment appear intact.  Cooperative with normal attention span and concentration.  Behavior appropriate. No anxious or depressed appearing.      Assessment      Assessment DM: DX 05-2019, A1c 6.6.  HTN Hypothyroidism Hyperlipidemia CV: --CAD cath 12/2012 single-vessel CAD,rx CV RF control, Rx Plavix, imdur --TIA remotely , apparently amaurosis fugax, was a started on on plavix per PCP, no further sx since. --On ASA and Plavix: Discuss 11/17/2016 Osteoporosis -  DEXAs @ [facility], T score -3.9 (12/2015).Prolia #1:  07/2016 MSK: Dr Lorin Mercy --DJD-  s/p neck and shoulder surgeries ; + DJD cervical spine --knee pain: on tramadol rx per pcp GI: GED, gastroparesis, diverticulosis, esophageal stricture H/o Iron deficiency anemia H/o granuloma RUL, resected 03-14-13  PLAN: DM: Diet controlled, check A1c Edema: Around the ankles, typically at the end of the day, rec: Low-salt diet, leg elevation, stay active (which she is).  Also recommend compression stockings (declined ). Offered to change HCTZ to Lasix but states she cannot tolerate furosemide. HTN: Ambulatory BPs within normal.  See above, continue losartan HCT Neuropathy: See LOV, test to rule out primary etiology negative.  Could not tolerate gabapentin due to headache, offered possible referral for further eval versus other medications, she declined, symptoms are not daily or severe. High cholesterol: Based on last FLP, Crestor dose increased, check labs. Hypothyroidism: Checking a TSH, further result.  Advised Despite of her symptoms, she states that she feels good all the time and has plenty of energy. RTC 6 months

## 2021-12-11 LAB — LIPID PANEL
Cholesterol: 173 mg/dL (ref 0–200)
HDL: 64.8 mg/dL (ref >39.00)
NonHDL: 107.74
Total CHOL/HDL Ratio: 3
Triglycerides: 222 mg/dL — ABNORMAL HIGH (ref 0.0–149.0)
VLDL: 44.4 mg/dL — ABNORMAL HIGH (ref 0.0–40.0)

## 2021-12-11 LAB — COMPREHENSIVE METABOLIC PANEL
ALT: 15 U/L (ref 0–35)
AST: 22 U/L (ref 0–37)
Albumin: 4.3 g/dL (ref 3.5–5.2)
Alkaline Phosphatase: 70 U/L (ref 39–117)
BUN: 11 mg/dL (ref 6–23)
CO2: 26 mEq/L (ref 19–32)
Calcium: 9 mg/dL (ref 8.4–10.5)
Chloride: 100 mEq/L (ref 96–112)
Creatinine, Ser: 0.9 mg/dL (ref 0.40–1.20)
GFR: 58.19 mL/min — ABNORMAL LOW (ref >60.00)
Glucose, Bld: 117 mg/dL — ABNORMAL HIGH (ref 70–99)
Potassium: 3.8 mEq/L (ref 3.5–5.1)
Sodium: 136 mEq/L (ref 135–145)
Total Bilirubin: 0.8 mg/dL (ref 0.2–1.2)
Total Protein: 6.7 g/dL (ref 6.0–8.3)

## 2021-12-11 LAB — TSH: TSH: 1.81 u[IU]/mL (ref 0.35–5.50)

## 2021-12-11 LAB — LDL CHOLESTEROL, DIRECT: Direct LDL: 79 mg/dL

## 2021-12-11 LAB — HEMOGLOBIN A1C: Hgb A1c MFr Bld: 6.4 % (ref 4.6–6.5)

## 2021-12-11 NOTE — Assessment & Plan Note (Addendum)
DM: Diet controlled, check A1c ?Edema: Around the ankles, typically at the end of the day, rec: Low-salt diet, leg elevation, stay active (which she is).  Also recommend compression stockings (declined ). ?Offered to change HCTZ to Lasix but states she cannot tolerate furosemide. ?HTN: Ambulatory BPs within normal.  See above, continue losartan HCT ?Neuropathy: See LOV, test to rule out primary etiology negative.  Could not tolerate gabapentin due to headache, offered possible referral for further eval versus other medications, she declined, symptoms are not daily or severe. ?High cholesterol: Based on last FLP, Crestor dose increased, check labs. ?Hypothyroidism: Checking a TSH, further result.  Advised ?Despite of her symptoms, she states that she feels good all the time and has plenty of energy. ?RTC 6 months ?

## 2021-12-13 MED ORDER — EZETIMIBE 10 MG PO TABS
10.0000 mg | ORAL_TABLET | Freq: Every day | ORAL | 3 refills | Status: DC
Start: 2021-12-13 — End: 2023-11-05

## 2021-12-13 NOTE — Addendum Note (Signed)
Addended byDamita Dunnings D on: 12/13/2021 03:21 PM ? ? Modules accepted: Orders ? ?

## 2021-12-26 NOTE — Telephone Encounter (Signed)
Pt > 60 days past due for Prolia injection.  If you would like for pt to continue with Prolia therapy, please have clinical staff reach out to pt for scheduling and to explain to importance of receiving Prolia injections every 6 months as abrupt cessation of Prolia raises risk of osteoporotic fracture.    "Discontinuation of Dmab is associated with a 3- to 5-fold higher risk for vertebral, major osteoporotic, and hip fractures [38,39]."   [email or web address]

## 2021-12-26 NOTE — Telephone Encounter (Signed)
Please arrange and Prolia ASAP. Let me know if unable to schedule

## 2021-12-26 NOTE — Telephone Encounter (Signed)
Pt scheduled for 01/09/22 at 2:30 pm

## 2022-01-09 ENCOUNTER — Ambulatory Visit (INDEPENDENT_AMBULATORY_CARE_PROVIDER_SITE_OTHER): Payer: Medicare PPO

## 2022-01-09 DIAGNOSIS — M810 Age-related osteoporosis without current pathological fracture: Secondary | ICD-10-CM | POA: Diagnosis not present

## 2022-01-09 MED ORDER — DENOSUMAB 60 MG/ML ~~LOC~~ SOSY
60.0000 mg | PREFILLED_SYRINGE | Freq: Once | SUBCUTANEOUS | Status: AC
  Administered 2022-01-09: 60 mg via SUBCUTANEOUS

## 2022-01-09 NOTE — Progress Notes (Signed)
Kathleen Thompson is a 88 y.o. female presents to the office today for prolia: injections, per physician's orders. Original order: Per Dr. Lucie Leather (med), 60 mg per ml (dose), subcutaneous (route) was administered lt arm (location) today. Patient tolerated injection.  Patient next injection due: in 6 months, appt will be made after checking current insurance benefits.

## 2022-01-12 ENCOUNTER — Other Ambulatory Visit: Payer: Self-pay | Admitting: Internal Medicine

## 2022-01-13 MED ORDER — ROSUVASTATIN CALCIUM 40 MG PO TABS
40.0000 mg | ORAL_TABLET | Freq: Every day | ORAL | 1 refills | Status: DC
Start: 2022-01-13 — End: 2022-10-24

## 2022-01-16 ENCOUNTER — Telehealth: Payer: Self-pay | Admitting: Internal Medicine

## 2022-01-16 NOTE — Telephone Encounter (Signed)
Pt states paz informed her that he would prescribe lasix.

## 2022-01-17 NOTE — Telephone Encounter (Signed)
Reviewed last OV- note below.   Edema: Around the ankles, typically at the end of the day, rec: Low-salt diet, leg elevation, stay active (which she is).  Also recommend compression stockings (declined ). Offered to change HCTZ to Lasix but states she cannot tolerate furosemide.

## 2022-02-10 ENCOUNTER — Other Ambulatory Visit: Payer: Self-pay | Admitting: Internal Medicine

## 2022-02-12 ENCOUNTER — Ambulatory Visit (HOSPITAL_BASED_OUTPATIENT_CLINIC_OR_DEPARTMENT_OTHER)
Admission: RE | Admit: 2022-02-12 | Discharge: 2022-02-12 | Disposition: A | Discharge status: Home / Self Care | Payer: Medicare PPO | Source: Ambulatory Visit | Attending: Internal Medicine | Admitting: Internal Medicine

## 2022-02-12 ENCOUNTER — Ambulatory Visit: Payer: Medicare PPO | Admitting: Internal Medicine

## 2022-02-12 VITALS — BP 118/70 | HR 77 | Temp 98.0°F | Resp 18 | Ht 62.0 in | Wt 165.2 lb

## 2022-02-12 DIAGNOSIS — E7849 Other hyperlipidemia: Secondary | ICD-10-CM | POA: Diagnosis not present

## 2022-02-12 DIAGNOSIS — R609 Edema, unspecified: Secondary | ICD-10-CM

## 2022-02-12 DIAGNOSIS — I2510 Atherosclerotic heart disease of native coronary artery without angina pectoris: Secondary | ICD-10-CM

## 2022-02-12 DIAGNOSIS — E039 Hypothyroidism, unspecified: Secondary | ICD-10-CM | POA: Diagnosis not present

## 2022-02-12 DIAGNOSIS — I10 Essential (primary) hypertension: Secondary | ICD-10-CM | POA: Diagnosis not present

## 2022-02-12 DIAGNOSIS — R0600 Dyspnea, unspecified: Secondary | ICD-10-CM | POA: Diagnosis not present

## 2022-02-12 MED ORDER — FUROSEMIDE 20 MG PO TABS: 20.0000 mg | ORAL_TABLET | Freq: Every day | ORAL | 1 refills | Status: DC

## 2022-02-12 MED ORDER — LOSARTAN POTASSIUM 50 MG PO TABS: 50.0000 mg | ORAL_TABLET | Freq: Every day | ORAL | 1 refills | Status: DC

## 2022-02-12 NOTE — Patient Instructions (Addendum)
Stop losartan HCT  Losartan 50 mg daily  Lasix 20 mg daily  Low-salt diet, leg elevation  Check the  blood pressure regularly BP GOAL is between 110/65 and  135/85. If it is consistently higher or lower, let me know    We will schedule a echocardiogram and refer you to cardiology    [place], Crawford back for   blood work only, in 10 to 14 days fasting BNP, BMP, FLP  Come back for a checkup in 2 months   STOP BY THE FIRST FLOOR:  get the XR

## 2022-02-12 NOTE — Progress Notes (Unsigned)
Subjective:    Patient ID: Kathleen Thompson, female    DOB: Jan 03, 1934, 88 y.o.   MRN: [id number]  DOS:  02/12/2022 Type of visit - description: Acute  Patient is coming to the office sooner than recommended because swelling. Reports worsening lower extremity edema, worse at the end of the day. This has been an issue for years, mostly in the summers, this year has been worse than previous ones.    She denies chest pain, DOE or palpitations. I asked about orthopnea but she actually sleeps in the recliner because that helps her neuropathy symptoms and also helps the swelling at nighttime.    Wt Readings from Last 3 Encounters:  02/12/22 165 lb 3.2 oz (74.9 kg)  12/10/21 161 lb 12.8 oz (73.4 kg)  09/10/21 158 lb 4 oz (71.8 kg)     Review of Systems See above   Past Medical History:  Diagnosis Date   Adenomatous colon polyp 02/2003   Arthritis    Blood transfusion    Cataract    Diverticulosis    Esophageal stricture    Gastroparesis    GERD (gastroesophageal reflux disease)    Headache    Hyperlipidemia    Hypertension    Hypothyroidism    Internal hemorrhoid    Iron deficiency anemia    Osteoporosis    PONV (postoperative nausea and vomiting)    PVD (peripheral vascular disease) (HCC)    Stroke (HCC)    TIA   TIA (transient ischemic attack)     Past Surgical History:  Procedure Laterality Date   ABDOMINAL HYSTERECTOMY     APPENDECTOMY     CERVICAL FUSION     EYE SURGERY Bilateral 08/2016   cataracts   LEFT HEART CATHETERIZATION WITH CORONARY ANGIOGRAM N/A 12/20/2012   Procedure: LEFT HEART CATHETERIZATION WITH CORONARY ANGIOGRAM;  Surgeon: Peter M Martinique, MD;  Location: [facility] CATH LAB;  Service: Cardiovascular;  Laterality: N/A;   shoulder surgery      VIDEO ASSISTED THORACOSCOPY (VATS)/WEDGE RESECTION Right 03/14/2013   Procedure: VIDEO ASSISTED THORACOSCOPY (VATS)/WEDGE RESECTION;  Surgeon: Melrose Nakayama, MD;  Location: [place];  Service: Thoracic;   Laterality: Right;    Current Outpatient Medications  Medication Instructions   aspirin 81 mg, Daily   CALCIUM PO 1 tablet, 2 times daily   clopidogrel (PLAVIX) 75 MG tablet Take 1 tablet by mouth once daily   Coenzyme Q10 (CO Q-10 PO) 1 tablet, Daily   ezetimibe (ZETIA) 10 mg, Oral, Daily   isosorbide mononitrate (IMDUR) 30 MG 24 hr tablet Take 1 tablet by mouth once daily   levothyroxine (SYNTHROID) 50 mcg, Oral, Daily before breakfast   losartan-hydrochlorothiazide (HYZAAR) 100-12.5 MG tablet Take 1/2 (one-half) tablet by mouth once daily   magnesium 30 mg, Oral, Daily,     ondansetron (ZOFRAN) 8 mg, Oral, Every 8 hours PRN   pantoprazole (PROTONIX) 40 MG tablet TAKE 1 TABLET BY MOUTH ONCE DAILY BEFORE BREAKFAST   rosuvastatin (CRESTOR) 40 mg, Oral, Daily at bedtime   traMADol (ULTRAM) 50 MG tablet TAKE 1 TABLET BY MOUTH EVERY 12 HOURS AS NEEDED FOR MODERATE PAIN   vitamin D (CHOLECALCIFEROL) 400 Units, Oral, Daily,         Objective:   Physical Exam BP 118/70 (BP Location: Left Arm, Patient Position: Sitting, Cuff Size: Normal)   Pulse 77   Temp 98 F (36.7 C) (Oral)   Resp 18   Ht '5\' 2"'$  (1.575 m)   Wt 165 lb 3.2  oz (74.9 kg)   SpO2 98%   BMI 30.22 kg/m  General:   Well developed, NAD, BMI noted.  HEENT:  Normocephalic . Face symmetric, atraumatic Neck: No JVD at 45 degrees Lungs:  CTA B Normal respiratory effort, no intercostal retractions, no accessory muscle use. Heart: RRR,  no murmur.  Abdomen:  Not distended, soft, non-tender. No rebound or rigidity.   Skin: Not pale. Not jaundice Lower extremities: no pretibial edema bilaterally  Neurologic:  alert & oriented X3.  Speech normal, gait appropriate for age and unassisted Psych--  Cognition and judgment appear intact.  Cooperative with normal attention span and concentration.  Behavior appropriate. No anxious or depressed appearing.     Assessment     Assessment DM: DX 05-2019, A1c 6.6.   HTN Hypothyroidism Hyperlipidemia CV: --CAD cath 12/2012 single-vessel CAD,rx CV RF control, Rx Plavix, imdur --TIA remotely , apparently amaurosis fugax, was a started on on plavix per PCP, no further sx since. 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 History of CAD, seems asymptomatic. Previously was recommended Lasix and declined but at this point she is ready to proceed. EKG: No acute today. Plan: DC Hyzaar Start losartan 100 mg and Lasix 20 mg every morning.  Watch BP.  Labs in 2 weeks. Echo, cardiology referral, chest x-ray to better assess her fluid status, labs in 10 days: BNP, BMP CAD: See above.   DM: Last A1c 2 months ago was 6.4. High cholesterol: Last LDL 79, Zetia was added to rosuvastatin, check FLP. Hypothyroidism: Last TSH satisfactory RTC labs 10 to 14 days  5-2 DM: Diet controlled, check A1c Edema: Around the ankles, typically at the end of the day, rec: Low-salt diet, leg elevation, stay active (which she is).  Also recommend compression stockings (declined ). Offered to change HCTZ to Lasix but states she cannot tolerate furosemide. HTN: Ambulatory BPs within normal.  See above, continue losartan HCT Neuropathy: See LOV, test to rule out primary etiology negative.  Could not tolerate gabapentin due to headache, offered possible referral for further eval versus other medications, she declined, symptoms are not daily or severe. High cholesterol: Based on last FLP, Crestor dose increased, check labs. Hypothyroidism: Checking a TSH,  further result.  Advised Despite of her symptoms, she states that she feels good all the time and has plenty of energy. RTC 6 months  1 Here for CPX DM: Diet controlled, check A1c. Neuropathy: New issue.   Sxs started around 6 months ago, as described above, probably related to diabetes noting that the onset has been kind of abrupt thus consider neuro eval in the fututre. In addition to routine labs we will get a N02, folic acid, SPEP, ANA, sed rate, RPR. Trial will gabapentin, low-dose, watch for drowsiness, titrate up if needed.  Reassess in 3 months. HTN: Seems well controlled.  Continue Hyzaar. Hypothyroidism: On Synthroid, check TSH Hyperlipidemia: On Crestor, checking labs CAD: On aspirin, Plavix, asymptomatic. Dystrophic nail: L great toe nail seems dystrophic, she is applying Penlac, area looks quite irritated.  Rec to stop Penlac, OTC throat: Hydrocortisone 1% as  needed, will reassess in 3 months. DJD: Currently on Ultram, states that she is intolerant to Tylenol ("make me nervous"). RTC 3 months

## 2022-02-13 NOTE — Assessment & Plan Note (Signed)
Edema:  As the chief complaint today, this is happening for years, in the summers, this summer is worse than before, related to venous insufficiency?   Has a h/o  CAD, no CP. Previously was recommended Lasix and declined but at this point she is ready to proceed. EKG: No acute today. Plan: -DC Hyzaar -Start losartan 100 mg and Lasix 20 mg every morning.  Watch BP.  Labs in 2 weeks. - Also ECHO, cards  referral, chest x-ray to better assess her fluid status -Labs in 10 days: BNP, BMP CAD: See above.   HTN: see above DM: Last A1c 2 months ago was 6.4. High cholesterol: Last LDL 79, Zetia was added to rosuvastatin, check FLP. Hypothyroidism: Last TSH satisfactory RTC labs 10 to 14 days RTC follow-up 2 months

## 2022-02-25 ENCOUNTER — Other Ambulatory Visit (INDEPENDENT_AMBULATORY_CARE_PROVIDER_SITE_OTHER): Payer: Medicare PPO

## 2022-02-25 ENCOUNTER — Ambulatory Visit (HOSPITAL_BASED_OUTPATIENT_CLINIC_OR_DEPARTMENT_OTHER)
Admission: RE | Admit: 2022-02-25 | Discharge: 2022-02-25 | Disposition: A | Discharge status: Home / Self Care | Payer: Medicare PPO | Source: Ambulatory Visit | Attending: Internal Medicine | Admitting: Internal Medicine

## 2022-02-25 DIAGNOSIS — R609 Edema, unspecified: Secondary | ICD-10-CM | POA: Diagnosis not present

## 2022-02-25 DIAGNOSIS — I2510 Atherosclerotic heart disease of native coronary artery without angina pectoris: Secondary | ICD-10-CM

## 2022-02-25 NOTE — Progress Notes (Signed)
  Echocardiogram 2D Echocardiogram has been performed.  Kathleen Thompson F 02/25/2022, 2:12 PM

## 2022-02-26 ENCOUNTER — Telehealth: Payer: Self-pay | Admitting: *Deleted

## 2022-02-26 LAB — BASIC METABOLIC PANEL
BUN: 11 mg/dL (ref 6–23)
CO2: 25 mEq/L (ref 19–32)
Calcium: 8.7 mg/dL (ref 8.4–10.5)
Chloride: 102 mEq/L (ref 96–112)
Creatinine, Ser: 0.86 mg/dL (ref 0.40–1.20)
GFR: 61.36 mL/min (ref >60.00)
Glucose, Bld: 110 mg/dL — ABNORMAL HIGH (ref 70–99)
Potassium: 4.1 mEq/L (ref 3.5–5.1)
Sodium: 138 mEq/L (ref 135–145)

## 2022-02-26 LAB — ECHOCARDIOGRAM COMPLETE
AR max vel: 1.77 cm2
AV Area VTI: 2.04 cm2
AV Area mean vel: 1.68 cm2
AV Mean grad: 6 mmHg
AV Peak grad: 12.1 mmHg
Ao pk vel: 1.74 m/s
Area-P 1/2: 3.6 cm2
S' Lateral: 2.6 cm

## 2022-02-26 LAB — LIPID PANEL
Cholesterol: 172 mg/dL (ref 0–200)
HDL: 67.5 mg/dL (ref >39.00)
LDL Cholesterol: 72 mg/dL (ref 0–99)
NonHDL: 104.78
Total CHOL/HDL Ratio: 3
Triglycerides: 166 mg/dL — ABNORMAL HIGH (ref 0.0–149.0)
VLDL: 33.2 mg/dL (ref 0.0–40.0)

## 2022-02-26 NOTE — Telephone Encounter (Signed)
Received call from [facility] lab. They are unable to use BNP specimen from yesterday and will need to be recollected. Contacted pt and she will return Thursday afternoon for redraw.

## 2022-02-27 ENCOUNTER — Other Ambulatory Visit: Payer: Self-pay

## 2022-02-27 ENCOUNTER — Other Ambulatory Visit (INDEPENDENT_AMBULATORY_CARE_PROVIDER_SITE_OTHER): Payer: Medicare PPO

## 2022-02-27 DIAGNOSIS — R609 Edema, unspecified: Secondary | ICD-10-CM | POA: Diagnosis not present

## 2022-02-27 LAB — BRAIN NATRIURETIC PEPTIDE: Pro B Natriuretic peptide (BNP): 89 pg/mL (ref 0.0–100.0)

## 2022-02-28 ENCOUNTER — Telehealth: Payer: Self-pay | Admitting: Internal Medicine

## 2022-02-28 NOTE — Telephone Encounter (Signed)
Requesting: tramadol '50mg'$   Contract: 09/04/20 UDS: 09/10/21 Last Visit: 02/12/22 Next Visit: 04/24/22 Last Refill: 12/03/21 #180 and 0RF  Please Advise

## 2022-02-28 NOTE — Telephone Encounter (Signed)
PDMP review, is slightly currently but will go ahead and send the prescription

## 2022-03-27 ENCOUNTER — Encounter: Payer: Self-pay | Admitting: Internal Medicine

## 2022-03-29 ENCOUNTER — Emergency Department (HOSPITAL_BASED_OUTPATIENT_CLINIC_OR_DEPARTMENT_OTHER)
Admission: EM | Admit: 2022-03-29 | Discharge: 2022-03-29 | Disposition: A | Discharge status: Home / Self Care | Payer: Medicare PPO | Attending: Emergency Medicine | Admitting: Emergency Medicine

## 2022-03-29 ENCOUNTER — Encounter (HOSPITAL_BASED_OUTPATIENT_CLINIC_OR_DEPARTMENT_OTHER): Payer: Self-pay | Admitting: Emergency Medicine

## 2022-03-29 ENCOUNTER — Other Ambulatory Visit: Payer: Self-pay

## 2022-03-29 DIAGNOSIS — Z7982 Long term (current) use of aspirin: Secondary | ICD-10-CM | POA: Diagnosis not present

## 2022-03-29 DIAGNOSIS — Z91040 Latex allergy status: Secondary | ICD-10-CM | POA: Insufficient documentation

## 2022-03-29 DIAGNOSIS — M545 Low back pain, unspecified: Secondary | ICD-10-CM | POA: Insufficient documentation

## 2022-03-29 DIAGNOSIS — B029 Zoster without complications: Secondary | ICD-10-CM | POA: Diagnosis not present

## 2022-03-29 DIAGNOSIS — R21 Rash and other nonspecific skin eruption: Secondary | ICD-10-CM | POA: Diagnosis not present

## 2022-03-29 MED ORDER — PREDNISONE 20 MG PO TABS
40.0000 mg | ORAL_TABLET | Freq: Every day | ORAL | 0 refills | Status: AC
Start: 2022-03-29 — End: 2022-04-03

## 2022-03-29 MED ORDER — VALACYCLOVIR HCL 1 G PO TABS
1000.0000 mg | ORAL_TABLET | Freq: Three times a day (TID) | ORAL | 0 refills | Status: AC
Start: 2022-03-29 — End: 2022-04-05

## 2022-03-29 NOTE — ED Triage Notes (Signed)
Pt arrives pov, c/o lower back pain radiating to left leg x 1 week, deneis injury, also endorses rash on left side x 3 days.

## 2022-03-29 NOTE — ED Provider Notes (Signed)
[place] EMERGENCY DEPARTMENT Provider Note   CSN: [id number] Arrival date & time: 03/29/22  1642     History  Chief Complaint  Patient presents with   Rash   Back Pain    Kathleen Thompson is a 88 y.o. female.  Patient here with rash for the last 3 days.  Mostly in her left lower back and around her left upper leg.  She thinks that she has shingles.  She has had a before.  Feels similar.  Denies any fever, chills.  No other abdominal pain, chest pain, shortness of breath.  The history is provided by the patient.       Home Medications Prior to Admission medications   Medication Sig Start Date End Date Taking? Authorizing Provider  predniSONE (DELTASONE) 20 MG tablet Take 2 tablets (40 mg total) by mouth daily for 5 days. 03/29/22 04/03/22 Yes Yoali Conry, DO  valACYclovir (VALTREX) 1000 MG tablet Take 1 tablet (1,000 mg total) by mouth 3 (three) times daily for 7 days. 03/29/22 04/05/22 Yes Abeera Flannery, DO  aspirin 81 MG tablet Take 81 mg by mouth daily.    [provider]  CALCIUM PO Take 1 tablet by mouth 2 (two) times daily.    [provider]  clopidogrel (PLAVIX) 75 MG tablet Take 1 tablet by mouth once daily 11/05/21   Colon Branch, MD  Coenzyme Q10 (CO Q-10 PO) Take 1 tablet by mouth daily.    [provider]  ezetimibe (ZETIA) 10 MG tablet Take 1 tablet (10 mg total) by mouth daily. Patient not taking: Reported on 02/12/2022 12/13/21   Colon Branch, MD  furosemide (LASIX) 20 MG tablet Take 1 tablet (20 mg total) by mouth daily. 02/12/22   Colon Branch, MD  isosorbide mononitrate (IMDUR) 30 MG 24 hr tablet Take 1 tablet by mouth once daily 10/21/21   Colon Branch, MD  levothyroxine (SYNTHROID) 50 MCG tablet Take 1 tablet (50 mcg total) by mouth daily before breakfast. 04/05/21   Colon Branch, MD  losartan (COZAAR) 50 MG tablet Take 1 tablet (50 mg total) by mouth daily. 02/12/22   Colon Branch, MD  magnesium 30 MG tablet Take 30 mg by mouth  daily.    [provider]  ondansetron (ZOFRAN) 8 MG tablet Take 1 tablet (8 mg total) by mouth every 8 (eight) hours as needed for nausea or vomiting. 04/04/20   Colon Branch, MD  pantoprazole (PROTONIX) 40 MG tablet TAKE 1 TABLET BY MOUTH ONCE DAILY BEFORE BREAKFAST 02/10/22   Colon Branch, MD  rosuvastatin (CRESTOR) 40 MG tablet Take 1 tablet (40 mg total) by mouth at bedtime. 01/13/22   Colon Branch, MD  traMADol (ULTRAM) 50 MG tablet TAKE 1 TABLET BY MOUTH EVERY 12 HOURS AS NEEDED FOR MODERATE PAIN 02/28/22   Colon Branch, MD  vitamin D, CHOLECALCIFEROL, 400 UNITS tablet Take 400 Units by mouth daily.    [provider]      Allergies    Clindamycin/lincomycin, Cymbalta [duloxetine hcl], Gabapentin, Hydrocodone, Rofecoxib, and Latex    Review of Systems   Review of Systems  Physical Exam Updated Vital Signs BP (!) 160/88   Pulse 79   Temp 98 F (36.7 C) (Oral)   Resp 16   Wt 74.9 kg   SpO2 100%   BMI 30.20 kg/m  Physical Exam Vitals and nursing note reviewed.  Constitutional:      General: She  is not in acute distress.    Appearance: She is well-developed.  HENT:     Head: Normocephalic and atraumatic.     Nose: Nose normal.     Mouth/Throat:     Mouth: Mucous membranes are moist.  Eyes:     Extraocular Movements: Extraocular movements intact.     Conjunctiva/sclera: Conjunctivae normal.     Pupils: Pupils are equal, round, and reactive to light.  Cardiovascular:     Rate and Rhythm: Normal rate and regular rhythm.     Pulses: Normal pulses.     Heart sounds: Normal heart sounds. No murmur heard. Pulmonary:     Effort: Pulmonary effort is normal. No respiratory distress.     Breath sounds: Normal breath sounds.  Abdominal:     Palpations: Abdomen is soft.     Tenderness: There is no abdominal tenderness.  Musculoskeletal:        General: No swelling.     Cervical back: Neck supple.  Skin:    General: Skin is warm and dry.     Capillary Refill:  Capillary refill takes less than 2 seconds.     Findings: Rash present.     Comments: Vesicular rash in the left lower back and left anterior thigh  Neurological:     Mental Status: She is alert.  Psychiatric:        Mood and Affect: Mood normal.     ED Results / Procedures / Treatments   Labs (all labs ordered are listed, but only abnormal results are displayed) Labs Reviewed - No data to display  EKG None  Radiology No results found.  Procedures Procedures    Medications Ordered in ED Medications - No data to display  ED Course/ Medical Decision Making/ A&P                           Medical Decision Making Risk Prescription drug management.   Kathleen Thompson is here with rash.  History of diverticulitis, TIA.  Normal vitals.  Overall she appears to have shingles type rash on exam.  We will treat with steroids and valacyclovir.  Symptoms for about 3 days.  She has no other signs of complication.  We will have her follow-up with primary care doctor.  Discharged in good condition.  This chart was dictated using voice recognition software.  Despite best efforts to proofread,  errors can occur which can change the documentation meaning.         Final Clinical Impression(s) / ED Diagnoses Final diagnoses:  Herpes zoster without complication    Rx / DC Orders ED Discharge Orders          Ordered    valACYclovir (VALTREX) 1000 MG tablet  3 times daily        03/29/22 1726    predniSONE (DELTASONE) 20 MG tablet  Daily        03/29/22 1726              Lennice Sites, DO 03/29/22 1727

## 2022-04-02 ENCOUNTER — Ambulatory Visit: Payer: Medicare PPO | Admitting: Orthopaedic Surgery

## 2022-04-04 DIAGNOSIS — X32XXXD Exposure to sunlight, subsequent encounter: Secondary | ICD-10-CM | POA: Diagnosis not present

## 2022-04-04 DIAGNOSIS — L570 Actinic keratosis: Secondary | ICD-10-CM | POA: Diagnosis not present

## 2022-04-04 DIAGNOSIS — R609 Edema, unspecified: Secondary | ICD-10-CM | POA: Diagnosis not present

## 2022-04-10 ENCOUNTER — Other Ambulatory Visit: Payer: Self-pay | Admitting: Internal Medicine

## 2022-04-12 ENCOUNTER — Other Ambulatory Visit: Payer: Self-pay | Admitting: Internal Medicine

## 2022-04-15 ENCOUNTER — Ambulatory Visit: Payer: Medicare PPO | Admitting: Internal Medicine

## 2022-04-18 ENCOUNTER — Encounter: Payer: Self-pay | Admitting: Cardiology

## 2022-04-18 ENCOUNTER — Ambulatory Visit: Payer: Medicare PPO | Attending: Cardiology | Admitting: Cardiology

## 2022-04-18 VITALS — BP 154/80 | HR 88 | Ht 62.0 in | Wt 165.0 lb

## 2022-04-18 DIAGNOSIS — I10 Essential (primary) hypertension: Secondary | ICD-10-CM | POA: Diagnosis not present

## 2022-04-18 DIAGNOSIS — M7989 Other specified soft tissue disorders: Secondary | ICD-10-CM | POA: Insufficient documentation

## 2022-04-18 DIAGNOSIS — G459 Transient cerebral ischemic attack, unspecified: Secondary | ICD-10-CM | POA: Diagnosis not present

## 2022-04-18 DIAGNOSIS — R0609 Other forms of dyspnea: Secondary | ICD-10-CM | POA: Diagnosis not present

## 2022-04-18 DIAGNOSIS — I2510 Atherosclerotic heart disease of native coronary artery without angina pectoris: Secondary | ICD-10-CM | POA: Diagnosis not present

## 2022-04-18 NOTE — Patient Instructions (Signed)
Medication Instructions:  Your physician recommends that you continue on your current medications as directed. Please refer to the Current Medication list given to you today.  *If you need a refill on your cardiac medications before your next appointment, please call your pharmacy*   Lab Work: Your physician recommends that you return for lab work in: Next week You can come Monday through Friday 8:30 am to 11:30am and 1:00 to 4:30. Stoy do not need to make an appointment as the order has already been placed. The labs you are going to have done are Lipid, ProBNP, BMP   Testing/Procedures: Your physician has requested that you have a lower extremity arterial duplex. This test is an ultrasound of the arteries in the legs. It looks at arterial blood flow in the legs. Allow one hour for Lower Arterial scans. There are no restrictions or special instructions    Follow-Up: At [facility], you and your health needs are our priority.  As part of our continuing mission to provide you with exceptional heart care, we have created designated Provider Care Teams.  These Care Teams include your primary Cardiologist (physician) and Advanced Practice Providers (APPs -  Physician Assistants and Nurse Practitioners) who all work together to provide you with the care you need, when you need it.  We recommend signing up for the patient portal called "MyChart".  Sign up information is provided on this After Visit Summary.  MyChart is used to connect with patients for Virtual Visits (Telemedicine).  Patients are able to view lab/test results, encounter notes, upcoming appointments, etc.  Non-urgent messages can be sent to your provider as well.   To learn more about what you can do with MyChart, go to [email or web address].    Your next appointment:   2 month(s)  The format for your next appointment:   In Person  Provider:   Jenne Campus, MD    Other  Instructions NA

## 2022-04-18 NOTE — Progress Notes (Unsigned)
Cardiology Consultation:    Date:  04/18/2022   ID:  [name], DOB 08-Jun-1934, MRN [id number]  PCP:  Colon Branch, MD  Cardiologist:  Jenne Campus, MD   Referring MD: Colon Branch, MD   Chief Complaint  Patient presents with   Leg Swelling    Ongoing for year     History of Present Illness:    Kathleen Thompson is a 88 y.o. female who is being seen today for the evaluation of swelling of lower extremities at the request of Colon Branch, MD. she is a sweet lady with past medical history significant for coronary artery disease in May 2014 she did have cardiac catheterization PTCA and stenting of coronary artery however I have no documentation of this I do not know which vessel, she seems to be stable since that time.  Also have history of TIA that happened years ago in form of amaurosis fugax she was put on Plavix and since that time there is no problem.  Additionally she include essential hypertension, dyslipidemia, she has been suffering for years.  From swelling of lower extremities interestingly swelling typically happen in the summertime, during the winter and not much, worse at evening time.  She was given very appropriately diuretic by her primary care physician legs better but still not perfect.  She was referred to [place] trying to figure out why she have swelling of lower extremities.  She said she got a lot of energy she can do a lot she does not have any shortness of breath there is no paroxysmal nocturnal dyspnea however this assessment is somewhat complicated by the fact that she did have shingles and she slept in the chair for last month or so but within the last few days she is sleeping in the bed she has no difficulty doing it.  She denies have any chest pain tightness squeezing pressure burning chest.  She will vividly remember in 2014 before her cardiac catheterization and stenting she had some chest pain does not have anymore.  She is trying to be active she does have gym  membership she goes to the regular basis she is not on any special diet.  Past Medical History:  Diagnosis Date   Adenomatous colon polyp 02/2003   Arthritis    Blood transfusion    Cataract    Diverticulosis    Esophageal stricture    Gastroparesis    GERD (gastroesophageal reflux disease)    Headache    Hyperlipidemia    Hypertension    Hypothyroidism    Internal hemorrhoid    Iron deficiency anemia    Osteoporosis    PONV (postoperative nausea and vomiting)    PVD (peripheral vascular disease) (HCC)    Stroke (HCC)    TIA   TIA (transient ischemic attack)     Past Surgical History:  Procedure Laterality Date   ABDOMINAL HYSTERECTOMY     APPENDECTOMY     CERVICAL FUSION     EYE SURGERY Bilateral 08/2016   cataracts   LEFT HEART CATHETERIZATION WITH CORONARY ANGIOGRAM N/A 12/20/2012   Procedure: LEFT HEART CATHETERIZATION WITH CORONARY ANGIOGRAM;  Surgeon: Peter M Martinique, MD;  Location: [facility] CATH LAB;  Service: Cardiovascular;  Laterality: N/A;   shoulder surgery      VIDEO ASSISTED THORACOSCOPY (VATS)/WEDGE RESECTION Right 03/14/2013   Procedure: VIDEO ASSISTED THORACOSCOPY (VATS)/WEDGE RESECTION;  Surgeon: Melrose Nakayama, MD;  Location: [place];  Service: Thoracic;  Laterality: Right;  Current Medications: Current Meds  Medication Sig   aspirin 81 MG tablet Take 81 mg by mouth daily.   CALCIUM PO Take 1 tablet by mouth 2 (two) times daily.   clopidogrel (PLAVIX) 75 MG tablet Take 1 tablet by mouth once daily (Patient taking differently: Take 75 mg by mouth daily.)   Coenzyme Q10 (CO Q-10 PO) Take 1 tablet by mouth daily.   ezetimibe (ZETIA) 10 MG tablet Take 1 tablet (10 mg total) by mouth daily.   furosemide (LASIX) 20 MG tablet Take 1 tablet (20 mg total) by mouth daily.   isosorbide mononitrate (IMDUR) 30 MG 24 hr tablet Take 1 tablet by mouth once daily (Patient taking differently: Take 30 mg by mouth daily.)   levothyroxine (SYNTHROID) 50 MCG tablet TAKE 1  TABLET BY MOUTH ONCE DAILY BEFORE BREAKFAST   losartan (COZAAR) 50 MG tablet Take 1 tablet (50 mg total) by mouth daily.   magnesium 30 MG tablet Take 30 mg by mouth daily.   ondansetron (ZOFRAN) 8 MG tablet Take 1 tablet (8 mg total) by mouth every 8 (eight) hours as needed for nausea or vomiting.   pantoprazole (PROTONIX) 40 MG tablet TAKE 1 TABLET BY MOUTH ONCE DAILY BEFORE BREAKFAST (Patient taking differently: Take 40 mg by mouth daily.)   rosuvastatin (CRESTOR) 40 MG tablet Take 1 tablet (40 mg total) by mouth at bedtime.   traMADol (ULTRAM) 50 MG tablet TAKE 1 TABLET BY MOUTH EVERY 12 HOURS AS NEEDED FOR MODERATE PAIN (Patient taking differently: Take 50 mg by mouth every 12 (twelve) hours as needed for moderate pain or severe pain.)   vitamin D, CHOLECALCIFEROL, 400 UNITS tablet Take 400 Units by mouth daily.     Allergies:   Clindamycin/lincomycin, Cymbalta [duloxetine hcl], Gabapentin, Hydrocodone, Rofecoxib, and Latex   Social History   Socioeconomic History   Marital status: Married    Spouse name: Not on file   Number of children: 2   Years of education: 11th grade   Highest education level: Not on file  Occupational History   Occupation: Retired-[employer]     Employer: RETIRED  Tobacco Use   Smoking status: Never   Smokeless tobacco: Never  Substance and Sexual Activity   Alcohol use: No   Drug use: No   Sexual activity: Yes  Other Topics Concern   Not on file  Social History Narrative   Household-- pt and wife   2 boys , one in [place], on in [place]    No surviving sibling out of seven.   One cup coffee with 1/2 caffeine.   Right-handed.   Social Determinants of Health   Financial Resource Strain: Low Risk  (05/15/2021)   Overall Financial Resource Strain (CARDIA)    Difficulty of Paying Living Expenses: Not hard at all  Food Insecurity: No Food Insecurity (05/15/2021)   Hunger Vital Sign    Worried About Running Out of Food in the Last Year: Never true    Ran Out of  Food in the Last Year: Never true  Transportation Needs: No Transportation Needs (05/15/2021)   PRAPARE - Hydrologist (Medical): No    Lack of Transportation (Non-Medical): No  Physical Activity: Inactive (05/15/2021)   Exercise Vital Sign    Days of Exercise per Week: 0 days    Minutes of Exercise per Session: 0 min  Stress: No Stress Concern Present (05/15/2021)   [place]  Feeling of Stress : Not at all  Social Connections: Moderately Isolated (05/15/2021)   Social Connection and Isolation Panel [NHANES]    Frequency of Communication with Friends and Family: More than three times a week    Frequency of Social Gatherings with Friends and Family: More than three times a week    Attends Religious Services: Never    Marine scientist or Organizations: No    Attends Music therapist: Never    Marital Status: Married     Family History: The patient's family history includes Asthma in her father; Colon cancer in her brother and sister; Diabetes in her maternal aunt, maternal uncle, and mother; Heart attack in her brother, brother, brother, brother, father, mother, sister, and sister. There is no history of Breast cancer. ROS:   Please see the history of present illness.    All 14 point review of systems negative except as described per history of present illness.  EKGs/Labs/Other Studies Reviewed:    The following studies were reviewed today: Echocardiogram done in July 2023 showed:  IMPRESSIONS     1. Left ventricular ejection fraction, by estimation, is 60 to 65%. The  left ventricle has normal function. The left ventricle has no regional  wall motion abnormalities. Left ventricular diastolic parameters are  consistent with Grade I diastolic  dysfunction (impaired relaxation).   2. Right ventricular systolic function is normal. The right ventricular  size is normal.  There is mildly elevated pulmonary artery systolic  pressure.   3. The mitral valve is normal in structure. No evidence of mitral valve  regurgitation. No evidence of mitral stenosis.   4. The aortic valve is normal in structure. Aortic valve regurgitation is  not visualized. No aortic stenosis is present.   5. There is mild dilatation of the aortic arch, measuring 40 mm.   6. The inferior vena cava is normal in size with greater than 50%  respiratory variability, suggesting right atrial pressure of 3 mmHg.  EKG:  EKG is  ordered today.  The ekg ordered today demonstrates normal sinus rhythm, normal P interval poor R wave progression anterior precordium, nonspecific ST segment changes  Recent Labs: 09/10/2021: Hemoglobin 14.2; Platelets 260.0 12/10/2021: ALT 15; TSH 1.81 02/25/2022: BUN 11; Creatinine, Ser 0.86; Potassium 4.1; Sodium 138 02/27/2022: Pro B Natriuretic peptide (BNP) 89.0  Recent Lipid Panel    Component Value Date/Time   CHOL 172 02/25/2022 1404   TRIG 166.0 (H) 02/25/2022 1404   TRIG 132 06/10/2006 1139   HDL 67.50 02/25/2022 1404   CHOLHDL 3 02/25/2022 1404   VLDL 33.2 02/25/2022 1404   LDLCALC 72 02/25/2022 1404   LDLDIRECT 79.0 12/10/2021 1445    Physical Exam:    VS:  BP (!) 154/80 (BP Location: Left Arm, Patient Position: Sitting)   Pulse 88   Ht '5\' 2"'$  (1.575 m)   Wt 165 lb (74.8 kg)   SpO2 97%   BMI 30.18 kg/m     Wt Readings from Last 3 Encounters:  04/18/22 165 lb (74.8 kg)  03/29/22 165 lb 2 oz (74.9 kg)  02/12/22 165 lb 3.2 oz (74.9 kg)     GEN:  Well nourished, well developed in no acute distress HEENT: Normal NECK: No JVD; No carotid bruits LYMPHATICS: No lymphadenopathy CARDIAC: RRR, no murmurs, no rubs, no gallops RESPIRATORY:  Clear to auscultation without rales, wheezing or rhonchi  ABDOMEN: Soft, non-tender, non-distended MUSCULOSKELETAL: 2+ swelling; No deformity  SKIN: Warm and dry NEUROLOGIC:  Alert and oriented x 3 PSYCHIATRIC:   Normal affect   ASSESSMENT:    1. Coronary artery disease involving native coronary artery of native heart without angina pectoris   2. Primary hypertension   3. h/o TIA     4. Swelling of both lower extremities    PLAN:    In order of problems listed above:  Swelling of lower extremities that is the main reason why she is in my office.  Echocardiogram showed normal left ventricle ejection fraction, right exertion with mild to which suggest normal pulmonary pressure at the time of the study.  Estimated pulmonary pressure was 39 mmHg which is mildly elevated by echocardiogram.  I will ask her to have venous duplex of lower extremities I be more looking for reflux rather than DVT but of course DVT need to be rule out.  I will also ask her to have Chem-7 done and proBNP.  I think there is a role to increase her diuretics however I am not going to do it until I get her Chem-7 back.  I initiated conversation about potentially having sleep apnea.  She said she snores a little bit but not much but in the future we may investigate that.   Essential hypertension blood pressure is elevated today but she said at home always good.  She is excited to see me she said this is first visit and she is somewhat scared.  We will continue following that. Coronary artery disease stable on appropriate medications which include antiplatelets therapy as well as statin high intensity.  I K PN show me LDL of 72 HDL 67 acceptable cholesterol profile we will continue present management Overall she is excellently managed by her primary care physician.  We will try to investigate the reason for swelling of lower extremities.  Will check for congestive heart failure, will check for venous insufficiency, will consider checking for obstructive sleep apnea since she does have mild elevation of pulm artery pressure. Mild enlargement of the aorta 40 mm which is minimally enlarged.  Continue following   Medication Adjustments/Labs  and Tests Ordered: Current medicines are reviewed at length with the patient today.  Concerns regarding medicines are outlined above.  No orders of the defined types were placed in this encounter.  No orders of the defined types were placed in this encounter.   Signed, Park Liter, MD, [facility]. 04/18/2022 3:21 PM    [facility]

## 2022-04-21 DIAGNOSIS — I2510 Atherosclerotic heart disease of native coronary artery without angina pectoris: Secondary | ICD-10-CM | POA: Diagnosis not present

## 2022-04-21 DIAGNOSIS — R0609 Other forms of dyspnea: Secondary | ICD-10-CM | POA: Diagnosis not present

## 2022-04-21 DIAGNOSIS — I10 Essential (primary) hypertension: Secondary | ICD-10-CM | POA: Diagnosis not present

## 2022-04-22 LAB — PRO B NATRIURETIC PEPTIDE: NT-Pro BNP: 198 pg/mL (ref 0–738)

## 2022-04-22 LAB — BASIC METABOLIC PANEL
BUN/Creatinine Ratio: 10 — ABNORMAL LOW (ref 12–28)
BUN: 9 mg/dL (ref 8–27)
CO2: 22 mmol/L (ref 20–29)
Calcium: 9.6 mg/dL (ref 8.7–10.3)
Chloride: 103 mmol/L (ref 96–106)
Creatinine, Ser: 0.89 mg/dL (ref 0.57–1.00)
Glucose: 78 mg/dL (ref 70–99)
Potassium: 4.2 mmol/L (ref 3.5–5.2)
Sodium: 142 mmol/L (ref 134–144)
eGFR: 63 mL/min/{1.73_m2} (ref >59)

## 2022-04-22 LAB — LIPID PANEL
Chol/HDL Ratio: 2.6 ratio (ref 0.0–4.4)
Cholesterol, Total: 203 mg/dL — ABNORMAL HIGH (ref 100–199)
HDL: 78 mg/dL (ref >39)
LDL Chol Calc (NIH): 101 mg/dL — ABNORMAL HIGH (ref 0–99)
Triglycerides: 139 mg/dL (ref 0–149)
VLDL Cholesterol Cal: 24 mg/dL (ref 5–40)

## 2022-04-24 ENCOUNTER — Ambulatory Visit: Payer: Medicare PPO | Admitting: Internal Medicine

## 2022-04-24 ENCOUNTER — Encounter: Payer: Self-pay | Admitting: Internal Medicine

## 2022-04-24 ENCOUNTER — Other Ambulatory Visit: Payer: Self-pay | Admitting: Internal Medicine

## 2022-04-24 VITALS — BP 126/76 | HR 83 | Temp 98.2°F | Resp 16 | Ht 62.0 in | Wt 164.5 lb

## 2022-04-24 DIAGNOSIS — E7849 Other hyperlipidemia: Secondary | ICD-10-CM

## 2022-04-24 DIAGNOSIS — R609 Edema, unspecified: Secondary | ICD-10-CM

## 2022-04-24 DIAGNOSIS — Z23 Encounter for immunization: Secondary | ICD-10-CM

## 2022-04-24 DIAGNOSIS — I10 Essential (primary) hypertension: Secondary | ICD-10-CM | POA: Diagnosis not present

## 2022-04-24 NOTE — Assessment & Plan Note (Signed)
Edema: Since LOV, saw cardiology last week, they noted a recent echo show normal EF, rx a  venous duplex of the legs mostly to look into venous insufficiency and to rule out DVT.  BMP was normal, LDL 101, proBNP normal.  They were considering rule out OSA. At this point, with a changes on medications at the last visit she is slightly better. Further adjustment of meds per cards, pending LE [place]. HTN BP was 154/80 when she saw cardiology, BP today is excellent. Plan: Continue Lasix, Imdur, losartan.  Monitor BPs  High cholesterol: Last LDL was slightly elevated, previous LDLs were better.  For now recommend to continue with high-dose rosuvastatin and Zetia pending cardiology opinion. Preventive care: Flu shot today Explained benefits of COVID booster, states she will do it. RTC 3 months

## 2022-04-24 NOTE — Patient Instructions (Addendum)
Elevate your legs 1 hour twice daily  Watch the salt intake  Check the  blood pressure regularly.  To 3 times a week. BP GOAL is between 110/65 and  135/85. If it is consistently higher or lower, let me know    [place], [place] back for   a checkup in 3 months   Per our records you are due for your diabetic eye exam. Please contact your eye doctor to schedule an appointment. Please have them send copies of your office visit notes to [place]. Our fax number is [phone]. If you need a referral to an eye doctor please let us know.

## 2022-04-24 NOTE — Progress Notes (Addendum)
Subjective:    Patient ID: Kathleen Thompson, female    DOB: 1934/07/05, 88 y.o.   MRN: [id number]  DOS:  04/24/2022 Type of visit - description: Follow-up  At the last visit, we had a long discussion about lower extremity edema. Overall edema seems improved. Swelling is not necessarily worse early in the morning but she noted that leg elevation does help.  Wt Readings from Last 3 Encounters:  04/24/22 164 lb 8 oz (74.6 kg)  04/18/22 165 lb (74.8 kg)  03/29/22 165 lb 2 oz (74.9 kg)   BP Readings from Last 3 Encounters:  04/24/22 126/76  04/18/22 (!) 154/80  03/29/22 (!) 160/88     Review of Systems See above   Past Medical History:  Diagnosis Date   Adenomatous colon polyp 02/2003   Arthritis    Blood transfusion    Cataract    Diverticulosis    Esophageal stricture    Gastroparesis    GERD (gastroesophageal reflux disease)    Headache    Hyperlipidemia    Hypertension    Hypothyroidism    Internal hemorrhoid    Iron deficiency anemia    Osteoporosis    PONV (postoperative nausea and vomiting)    PVD (peripheral vascular disease) (HCC)    Stroke (HCC)    TIA   TIA (transient ischemic attack)     Past Surgical History:  Procedure Laterality Date   ABDOMINAL HYSTERECTOMY     APPENDECTOMY     CERVICAL FUSION     EYE SURGERY Bilateral 08/2016   cataracts   LEFT HEART CATHETERIZATION WITH CORONARY ANGIOGRAM N/A 12/20/2012   Procedure: LEFT HEART CATHETERIZATION WITH CORONARY ANGIOGRAM;  Surgeon: Peter M Martinique, MD;  Location: [facility] CATH LAB;  Service: Cardiovascular;  Laterality: N/A;   shoulder surgery      VIDEO ASSISTED THORACOSCOPY (VATS)/WEDGE RESECTION Right 03/14/2013   Procedure: VIDEO ASSISTED THORACOSCOPY (VATS)/WEDGE RESECTION;  Surgeon: Melrose Nakayama, MD;  Location: [place];  Service: Thoracic;  Laterality: Right;    Current Outpatient Medications  Medication Instructions   aspirin 81 mg, Daily   CALCIUM PO 1 tablet, 2 times daily   clopidogrel  (PLAVIX) 75 MG tablet Take 1 tablet by mouth once daily   Coenzyme Q10 (CO Q-10 PO) 1 tablet, Daily   ezetimibe (ZETIA) 10 mg, Oral, Daily   furosemide (LASIX) 20 mg, Oral, Daily   isosorbide mononitrate (IMDUR) 30 MG 24 hr tablet Take 1 tablet by mouth once daily   levothyroxine (SYNTHROID) 50 mcg, Oral, Daily before breakfast   losartan (COZAAR) 50 mg, Oral, Daily   magnesium 30 mg, Oral, Daily,     ondansetron (ZOFRAN) 8 mg, Oral, Every 8 hours PRN   pantoprazole (PROTONIX) 40 MG tablet TAKE 1 TABLET BY MOUTH ONCE DAILY BEFORE BREAKFAST   rosuvastatin (CRESTOR) 40 mg, Oral, Daily at bedtime   traMADol (ULTRAM) 50 MG tablet TAKE 1 TABLET BY MOUTH EVERY 12 HOURS AS NEEDED FOR MODERATE PAIN   vitamin D (CHOLECALCIFEROL) 400 Units, Oral, Daily,         Objective:   Physical Exam BP 126/76   Pulse 83   Temp 98.2 F (36.8 C) (Oral)   Resp 16   Ht '5\' 2"'$  (1.575 m)   Wt 164 lb 8 oz (74.6 kg)   SpO2 97%   BMI 30.09 kg/m  General:   Well developed, NAD, BMI noted. HEENT:  Normocephalic . Face symmetric, atraumatic Lungs:  CTA B Normal respiratory effort, no  intercostal retractions, no accessory muscle use. Heart: RRR,  no murmur.  Lower extremities: Symmetric pretibial edema still there, +/+++, improved. Skin: Not pale. Not jaundice Neurologic:  alert & oriented X3.  Speech normal, gait appropriate for age and unassisted Psych--  Cognition and judgment appear intact.  Cooperative with normal attention span and concentration.  Behavior appropriate. No anxious or depressed appearing.      Assessment     Assessment DM: DX 05-2019, A1c 6.6.  HTN Hypothyroidism Hyperlipidemia CV: --CAD cath 12/2012 single-vessel CAD,rx CV RF control, Rx Plavix, imdur --TIA remotely , apparently amaurosis fugax, was a started on on plavix per PCP, no further sx since. 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  BMP was normal, LDL 101, proBNP normal.  They were considering rule out OSA. At this point, with a changes on medications at the last visit she is slightly better. Further adjustment of meds per cards, pending LE [place]. HTN BP was 154/80 when she saw cardiology, BP today is excellent. Plan: Continue Lasix, Imdur, losartan.  Monitor BPs  High cholesterol: Last LDL was slightly elevated, previous LDLs were better.  For now recommend to continue with high-dose rosuvastatin and Zetia pending cardiology opinion. Preventive care: Flu shot today Explained benefits of COVID booster, states she will do it. RTC 3 months

## 2022-04-25 ENCOUNTER — Ambulatory Visit (HOSPITAL_COMMUNITY)
Admission: RE | Admit: 2022-04-25 | Discharge: 2022-04-25 | Disposition: A | Discharge status: Home / Self Care | Payer: Medicare PPO | Source: Ambulatory Visit | Attending: Internal Medicine | Admitting: Internal Medicine

## 2022-04-25 DIAGNOSIS — M7989 Other specified soft tissue disorders: Secondary | ICD-10-CM

## 2022-04-30 ENCOUNTER — Telehealth: Payer: Self-pay

## 2022-04-30 DIAGNOSIS — I10 Essential (primary) hypertension: Secondary | ICD-10-CM

## 2022-04-30 MED ORDER — FUROSEMIDE 20 MG PO TABS: ORAL_TABLET | ORAL | 1 refills | Status: DC

## 2022-04-30 NOTE — Telephone Encounter (Signed)
Patient notified of results and recommendations and agree and understood Dr. Raliegh Ip plan. Rx sent., lab order on file. Patient will come by the [facility] location to have her blood draw and is aware of there hour on Friday.

## 2022-04-30 NOTE — Telephone Encounter (Signed)
-----   Message from Park Liter, MD sent at 04/29/2022  7:49 PM EDT ----- Test showed low likelihood for congestive heart failure.  I think it would be reasonable to try a limited higher dose of diuretics she takes 20 mg of Lasix every day I would advise to start taking 20 mg 1 day alternating with 40 mg, Chem-7 need to be done within the next week

## 2022-04-30 NOTE — Telephone Encounter (Signed)
-----   Message from Park Liter, MD sent at 04/29/2022  7:53 PM EDT ----- No evidence of DVT on venous duplex

## 2022-04-30 NOTE — Telephone Encounter (Signed)
Patient notified of results.

## 2022-05-01 ENCOUNTER — Other Ambulatory Visit: Payer: Self-pay | Admitting: Internal Medicine

## 2022-05-09 DIAGNOSIS — I10 Essential (primary) hypertension: Secondary | ICD-10-CM | POA: Diagnosis not present

## 2022-05-10 LAB — BASIC METABOLIC PANEL
BUN/Creatinine Ratio: 11 — ABNORMAL LOW (ref 12–28)
BUN: 10 mg/dL (ref 8–27)
CO2: 19 mmol/L — ABNORMAL LOW (ref 20–29)
Calcium: 9 mg/dL (ref 8.7–10.3)
Chloride: 103 mmol/L (ref 96–106)
Creatinine, Ser: 0.93 mg/dL (ref 0.57–1.00)
Glucose: 135 mg/dL — ABNORMAL HIGH (ref 70–99)
Potassium: 4.2 mmol/L (ref 3.5–5.2)
Sodium: 136 mmol/L (ref 134–144)
eGFR: 60 mL/min/{1.73_m2} (ref >59)

## 2022-05-16 ENCOUNTER — Telehealth: Payer: Self-pay

## 2022-05-16 NOTE — Telephone Encounter (Signed)
Results reviewed with pt as per Dr. Krasowski's note.  Pt verbalized understanding and had no additional questions. Routed to PCP  

## 2022-05-31 ENCOUNTER — Telehealth: Payer: Self-pay | Admitting: Internal Medicine

## 2022-06-02 MED ORDER — TRAMADOL HCL 50 MG PO TABS: 50.0000 mg | ORAL_TABLET | Freq: Two times a day (BID) | ORAL | 0 refills | Status: DC | PRN

## 2022-06-02 NOTE — Telephone Encounter (Signed)
Requesting: tramadol '50mg'$   Contract: 09/04/20 UDS: 09/10/21 Last Visit: 04/24/22 Next Visit: 07/25/22 Last Refill: 02/28/22 #180 and 0RF  Please Advise

## 2022-06-02 NOTE — Telephone Encounter (Signed)
PDMP okay, Rx sent 

## 2022-06-11 ENCOUNTER — Ambulatory Visit (INDEPENDENT_AMBULATORY_CARE_PROVIDER_SITE_OTHER): Payer: Medicare PPO

## 2022-06-11 VITALS — BP 116/62 | Wt 164.0 lb

## 2022-06-11 DIAGNOSIS — Z0000 Encounter for general adult medical examination without abnormal findings: Secondary | ICD-10-CM

## 2022-06-11 NOTE — Patient Instructions (Signed)
Kathleen Thompson , Thank you for taking time to come for your Medicare Wellness Visit. I appreciate your ongoing commitment to your health goals. Please review the following plan we discussed and let me know if I can assist you in the future.   These are the goals we discussed:  Goals      Patient Stated     Maintain current health     Patient Stated     Lose weight         This is a list of the screening recommended for you and due dates:  Health Maintenance  Topic Date Due   Tetanus Vaccine  02/19/2020   Eye exam for diabetics  02/19/2021   Hemoglobin A1C  06/12/2022   Complete foot exam   09/10/2022   Medicare Annual Wellness Visit  06/12/2023   Pneumonia Vaccine  Completed   Flu Shot  Completed   DEXA scan (bone density measurement)  Completed   Zoster (Shingles) Vaccine  Completed   HPV Vaccine  Aged Out   COVID-19 Vaccine  Discontinued    Advanced directives: Advance directive discussed with you today. Even though you declined this today please call our office should you change your mind and we can give you the proper paperwork for you to fill out.   Conditions/risks identified: lose weight   Next appointment: Follow up in one year for your annual wellness visit.   Preventive Care 37 Years and Older, Female  Preventive care refers to lifestyle choices and visits with your health care provider that can promote health and wellness. What does preventive care include? A yearly physical exam. This is also called an annual well check. Dental exams once or twice a year. Routine eye exams. Ask your health care provider how often you should have your eyes checked. Personal lifestyle choices, including: Daily care of your teeth and gums. Regular physical activity. Eating a healthy diet. Avoiding tobacco and drug use. Limiting alcohol use. Practicing safe sex. Taking low doses of aspirin every day. Taking vitamin and mineral supplements as recommended by your health care  provider. What happens during an annual well check? The services and screenings done by your health care provider during your annual well check will depend on your age, overall health, lifestyle risk factors, and family history of disease. Counseling  Your health care provider may ask you questions about your: Alcohol use. Tobacco use. Drug use. Emotional well-being. Home and relationship well-being. Sexual activity. Eating habits. History of falls. Memory and ability to understand (cognition). Work and work Statistician. Screening  You may have the following tests or measurements: Height, weight, and BMI. Blood pressure. Lipid and cholesterol levels. These may be checked every 5 years, or more frequently if you are over 46 years old. Skin check. Lung cancer screening. You may have this screening every year starting at age 38 if you have a 30-pack-year history of smoking and currently smoke or have quit within the past 15 years. Fecal occult blood test (FOBT) of the stool. You may have this test every year starting at age 58. Flexible sigmoidoscopy or colonoscopy. You may have a sigmoidoscopy every 5 years or a colonoscopy every 10 years starting at age 21. Prostate cancer screening. Recommendations will vary depending on your family history and other risks. Hepatitis C blood test. Hepatitis B blood test. Sexually transmitted disease (STD) testing. Diabetes screening. This is done by checking your blood sugar (glucose) after you have not eaten for a while (fasting). You  may have this done every 1-3 years. Abdominal aortic aneurysm (AAA) screening. You may need this if you are a current or former smoker. Osteoporosis. You may be screened starting at age 66 if you are at high risk. Talk with your health care provider about your test results, treatment options, and if necessary, the need for more tests. Vaccines  Your health care provider may recommend certain vaccines, such  as: Influenza vaccine. This is recommended every year. Tetanus, diphtheria, and acellular pertussis (Tdap, Td) vaccine. You may need a Td booster every 10 years. Zoster vaccine. You may need this after age 43. Pneumococcal 13-valent conjugate (PCV13) vaccine. One dose is recommended after age 61. Pneumococcal polysaccharide (PPSV23) vaccine. One dose is recommended after age 72. Talk to your health care provider about which screenings and vaccines you need and how often you need them. This information is not intended to replace advice given to you by your health care provider. Make sure you discuss any questions you have with your health care provider. Document Released: 08/24/2015 Document Revised: 04/16/2016 Document Reviewed: 05/29/2015 Elsevier Interactive Patient Education  2017 [place] Prevention in the Home Falls can cause injuries. They can happen to people of all ages. There are many things you can do to make your home safe and to help prevent falls. What can I do on the outside of my home? Regularly fix the edges of walkways and driveways and fix any cracks. Remove anything that might make you trip as you walk through a door, such as a raised step or threshold. Trim any bushes or trees on the path to your home. Use bright outdoor lighting. Clear any walking paths of anything that might make someone trip, such as rocks or tools. Regularly check to see if handrails are loose or broken. Make sure that both sides of any steps have handrails. Any raised decks and porches should have guardrails on the edges. Have any leaves, snow, or ice cleared regularly. Use sand or salt on walking paths during winter. Clean up any spills in your garage right away. This includes oil or grease spills. What can I do in the bathroom? Use night lights. Install grab bars by the toilet and in the tub and shower. Do not use towel bars as grab bars. Use non-skid mats or decals in the tub or  shower. If you need to sit down in the shower, use a plastic, non-slip stool. Keep the floor dry. Clean up any water that spills on the floor as soon as it happens. Remove soap buildup in the tub or shower regularly. Attach bath mats securely with double-sided non-slip rug tape. Do not have throw rugs and other things on the floor that can make you trip. What can I do in the bedroom? Use night lights. Make sure that you have a light by your bed that is easy to reach. Do not use any sheets or blankets that are too big for your bed. They should not hang down onto the floor. Have a firm chair that has side arms. You can use this for support while you get dressed. Do not have throw rugs and other things on the floor that can make you trip. What can I do in the kitchen? Clean up any spills right away. Avoid walking on wet floors. Keep items that you use a lot in easy-to-reach places. If you need to reach something above you, use a strong step stool that has a grab bar. Keep electrical  cords out of the way. Do not use floor polish or wax that makes floors slippery. If you must use wax, use non-skid floor wax. Do not have throw rugs and other things on the floor that can make you trip. What can I do with my stairs? Do not leave any items on the stairs. Make sure that there are handrails on both sides of the stairs and use them. Fix handrails that are broken or loose. Make sure that handrails are as long as the stairways. Check any carpeting to make sure that it is firmly attached to the stairs. Fix any carpet that is loose or worn. Avoid having throw rugs at the top or bottom of the stairs. If you do have throw rugs, attach them to the floor with carpet tape. Make sure that you have a light switch at the top of the stairs and the bottom of the stairs. If you do not have them, ask someone to add them for you. What else can I do to help prevent falls? Wear shoes that: Do not have high heels. Have  rubber bottoms. Are comfortable and fit you well. Are closed at the toe. Do not wear sandals. If you use a stepladder: Make sure that it is fully opened. Do not climb a closed stepladder. Make sure that both sides of the stepladder are locked into place. Ask someone to hold it for you, if possible. Clearly mark and make sure that you can see: Any grab bars or handrails. First and last steps. Where the edge of each step is. Use tools that help you move around (mobility aids) if they are needed. These include: Canes. Walkers. Scooters. Crutches. Turn on the lights when you go into a dark area. Replace any light bulbs as soon as they burn out. Set up your furniture so you have a clear path. Avoid moving your furniture around. If any of your floors are uneven, fix them. If there are any pets around you, be aware of where they are. Review your medicines with your doctor. Some medicines can make you feel dizzy. This can increase your chance of falling. Ask your doctor what other things that you can do to help prevent falls. This information is not intended to replace advice given to you by your health care provider. Make sure you discuss any questions you have with your health care provider. Document Released: 05/24/2009 Document Revised: 01/03/2016 Document Reviewed: 09/01/2014 Elsevier Interactive Patient Education  2017 [employer].

## 2022-06-11 NOTE — Progress Notes (Addendum)
I connected with  Kathleen Thompson on 06/11/22 by a audio enabled telemedicine application and verified that I am speaking with the correct person using two identifiers.  Patient Location: Home  Provider Location: Home Office  I discussed the limitations of evaluation and management by telemedicine. The patient expressed understanding and agreed to proceed.   Subjective:   Kathleen Thompson is a 88 y.o. female who presents for Medicare Annual (Subsequent) preventive examination.  Review of Systems     Cardiac Risk Factors include: advanced age (>82mn, >>68women);hypertension;dyslipidemia;diabetes mellitus;obesity (BMI >30kg/m2)     Objective:    Today's Vitals   06/11/22 1319  BP: 116/62  Weight: 164 lb (74.4 kg)   Body mass index is 30 kg/m.     06/11/2022    1:25 PM 03/29/2022    4:57 PM 05/15/2021    3:07 PM 02/27/2020    6:51 AM 02/10/2020    3:17 PM 06/12/2018    9:11 PM 03/14/2013   12:00 PM  Advanced Directives  Does Patient Have a Medical Advance Directive? No No No No No No Patient does not have advance directive;Patient would not like information  Would patient like information on creating a medical advance directive? No - Patient declined  Yes (MAU/Ambulatory/Procedural Areas - Information given) Yes (ED - Information included in AVS)     Pre-existing out of facility DNR order (yellow form or pink MOST form)       No    Current Medications (verified) Outpatient Encounter Medications as of 06/11/2022  Medication Sig   aspirin 81 MG tablet Take 81 mg by mouth daily.   CALCIUM PO Take 1 tablet by mouth 2 (two) times daily.   clopidogrel (PLAVIX) 75 MG tablet Take 1 tablet (75 mg total) by mouth daily.   Coenzyme Q10 (CO Q-10 PO) Take 1 tablet by mouth daily.   ezetimibe (ZETIA) 10 MG tablet Take 1 tablet (10 mg total) by mouth daily.   furosemide (LASIX) 20 MG tablet Take 1 tablet ('20mg'$ ) by mouth daily alternating with 40 mg every other day.   isosorbide mononitrate  (IMDUR) 30 MG 24 hr tablet Take 1 tablet by mouth once daily   levothyroxine (SYNTHROID) 50 MCG tablet TAKE 1 TABLET BY MOUTH ONCE DAILY BEFORE BREAKFAST   losartan (COZAAR) 50 MG tablet Take 1 tablet (50 mg total) by mouth daily.   magnesium 30 MG tablet Take 30 mg by mouth daily.   pantoprazole (PROTONIX) 40 MG tablet TAKE 1 TABLET BY MOUTH ONCE DAILY BEFORE BREAKFAST (Patient taking differently: Take 40 mg by mouth daily.)   rosuvastatin (CRESTOR) 40 MG tablet Take 1 tablet (40 mg total) by mouth at bedtime.   traMADol (ULTRAM) 50 MG tablet Take 1 tablet (50 mg total) by mouth every 12 (twelve) hours as needed for moderate pain or severe pain.   vitamin D, CHOLECALCIFEROL, 400 UNITS tablet Take 400 Units by mouth daily.   [DISCONTINUED] ondansetron (ZOFRAN) 8 MG tablet Take 1 tablet (8 mg total) by mouth every 8 (eight) hours as needed for nausea or vomiting. (Patient not taking: Reported on 04/24/2022)   No facility-administered encounter medications on file as of 06/11/2022.    Allergies (verified) Clindamycin/lincomycin, Cymbalta [duloxetine hcl], Gabapentin, Hydrocodone, Rofecoxib, and Latex   History: Past Medical History:  Diagnosis Date   Adenomatous colon polyp 02/2003   Arthritis    Blood transfusion    Cataract    Diverticulosis    Esophageal stricture    Gastroparesis  GERD (gastroesophageal reflux disease)    Headache    Hyperlipidemia    Hypertension    Hypothyroidism    Internal hemorrhoid    Iron deficiency anemia    Osteoporosis    PONV (postoperative nausea and vomiting)    PVD (peripheral vascular disease) (HCC)    Stroke (HCC)    TIA   TIA (transient ischemic attack)    Past Surgical History:  Procedure Laterality Date   ABDOMINAL HYSTERECTOMY     APPENDECTOMY     CERVICAL FUSION     EYE SURGERY Bilateral 08/2016   cataracts   LEFT HEART CATHETERIZATION WITH CORONARY ANGIOGRAM N/A 12/20/2012   Procedure: LEFT HEART CATHETERIZATION WITH CORONARY  ANGIOGRAM;  Surgeon: Peter M Martinique, MD;  Location[facility] CATH LAB;  Service: Cardiovascular;  Laterality: N/A;   shoulder surgery      VIDEO ASSISTED THORACOSCOPY (VATS)/WEDGE RESECTION Right 03/14/2013   Procedure: VIDEO ASSISTED THORACOSCOPY (VATS)/WEDGE RESECTION;  Surgeon: Melrose Nakayama, MD;  Location: [facility];  Service: Thoracic;  Laterality: Right;   Family History  Problem Relation Age of Onset   Diabetes Mother    Heart attack Mother    Heart attack Father    Asthma Father    Colon cancer Brother        at age 76   Colon cancer Sister        at age 41   Diabetes Maternal Aunt    Diabetes Maternal Uncle    Heart attack Sister    Heart attack Brother    Heart attack Brother    Heart attack Brother    Heart attack Brother    Heart attack Sister    Breast cancer Neg Hx    Social History   Socioeconomic History   Marital status: Married    Spouse name: Not on file   Number of children: 2   Years of education: 11th grade   Highest education level: Not on file  Occupational History   Occupation: Retired-[employer]     Employer: RETIRED  Tobacco Use   Smoking status: Never   Smokeless tobacco: Never  Substance and Sexual Activity   Alcohol use: No   Drug use: No   Sexual activity: Yes  Other Topics Concern   Not on file  Social History Narrative   Household-- pt and wife   2 boys , one in [place], on in [place]    No surviving sibling out of seven.   One cup coffee with 1/2 caffeine.   Right-handed.   Social Determinants of Health   Financial Resource Strain: Low Risk  (06/11/2022)   Overall Financial Resource Strain (CARDIA)    Difficulty of Paying Living Expenses: Not hard at all  Food Insecurity: No Food Insecurity (06/11/2022)   Hunger Vital Sign    Worried About Running Out of Food in the Last Year: Never true    Ran Out of Food in the Last Year: Never true  Transportation Needs: No Transportation Needs (06/11/2022)   PRAPARE - Radiographer, therapeutic (Medical): No    Lack of Transportation (Non-Medical): No  Physical Activity: Insufficiently Active (06/11/2022)   Exercise Vital Sign    Days of Exercise per Week: 1 day    Minutes of Exercise per Session: 30 min  Stress: No Stress Concern Present (06/11/2022)   [place]    Feeling of Stress : Not at all  Social Connections: Moderately  Integrated (06/11/2022)   Social Connection and Isolation Panel [NHANES]    Frequency of Communication with Friends and Family: More than three times a week    Frequency of Social Gatherings with Friends and Family: More than three times a week    Attends Religious Services: 1 to 4 times per year    Active Member of [employer] or Organizations: No    Attends Music therapist: Never    Marital Status: Married    Tobacco Counseling Counseling given: Not Answered   Clinical Intake:  Pre-visit preparation completed: Yes  Pain : No/denies pain     BMI - recorded: 30 Nutritional Risks: None Diabetes: Yes CBG done?: No Did pt. bring in CBG monitor from home?: No  How often do you need to have someone help you when you read instructions, pamphlets, or other written materials from your doctor or pharmacy?: 1 - Never  Diabetic?Nutrition Risk Assessment:  Has the patient had any N/V/D within the last 2 months?  No  Does the patient have any non-healing wounds?  No  Has the patient had any unintentional weight loss or weight gain?  No   Diabetes:  Is the patient diabetic?  Yes  If diabetic, was a CBG obtained today?  No  Did the patient bring in their glucometer from home?  No  How often do you monitor your CBG's? N/A.   Financial Strains and Diabetes Management:  Are you having any financial strains with the device, your supplies or your medication? No .  Does the patient want to be seen by Chronic Care Management for management of their diabetes?  No   Would the patient like to be referred to a Nutritionist or for Diabetic Management?  No   Diabetic Exams:  Diabetic Eye Exam: Overdue for diabetic eye exam. Pt has been advised about the importance in completing this exam. Patient advised to call and schedule an eye exam. Diabetic Foot Exam: Completed 09/10/21   Interpreter Needed?: No  Information entered by :: Charlott Rakes, LPN   Activities of Daily Living    06/11/2022    1:26 PM  In your present state of health, do you have any difficulty performing the following activities:  Hearing? 0  Vision? 0  Difficulty concentrating or making decisions? 0  Walking or climbing stairs? 0  Dressing or bathing? 0  Doing errands, shopping? 0  Preparing Food and eating ? N  Using the Toilet? N  In the past six months, have you accidently leaked urine? N  Do you have problems with loss of bowel control? N  Managing your Medications? N  Managing your Finances? N  Housekeeping or managing your Housekeeping? N    Patient Care Team: Colon Branch, MD as PCP - General (Internal Medicine) Lia Foyer, NP as Nurse Practitioner (Nurse Practitioner) Marybelle Killings, MD as Consulting Physician (Orthopedic Surgery) Gavin Pound, MD as Consulting Physician (Rheumatology)  Indicate any recent Medical Services you may have received from other than [facility] providers in the past year (date may be approximate).     Assessment:   This is a routine wellness examination for [place].  Hearing/Vision screen Hearing Screening - Comments:: Pt denies any hearing issues Vision Screening - Comments:: [facility]   Dietary issues and exercise activities discussed: Current Exercise Habits: Home exercise routine, Time (Minutes): 30, Frequency (Times/Week): 1, Weekly Exercise (Minutes/Week): 30   Goals Addressed  This Visit's Progress    Patient Stated       Lose weight        Depression  Screen    06/11/2022    1:22 PM 04/24/2022   10:48 AM 02/13/2022   11:51 AM 09/10/2021    1:02 PM 05/15/2021    3:10 PM 01/08/2021    1:03 PM 09/04/2020   11:14 AM  PHQ 2/9 Scores  PHQ - 2 Score 0 0 0 0 0 0 0    Fall Risk    06/11/2022    1:26 PM 04/24/2022   10:48 AM 02/13/2022   11:51 AM 09/10/2021    1:02 PM 05/15/2021    3:09 PM  [place] in the past year? 0 0 0 0 0  Number falls in past yr: 0 0 0 0 0  Injury with Fall? 0 0 0 0 0  Risk for fall due to : Impaired vision      Follow up Falls prevention discussed Falls evaluation completed Falls evaluation completed Falls evaluation completed Falls prevention discussed    FALL RISK PREVENTION PERTAINING TO THE HOME:  Any stairs in or around the home? No  If so, are there any without handrails? No  Home free of loose throw rugs in walkways, pet beds, electrical cords, etc? Yes  Adequate lighting in your home to reduce risk of falls? Yes   ASSISTIVE DEVICES UTILIZED TO PREVENT FALLS:  Life alert? No  Use of a cane, walker or w/c? No  Grab bars in the bathroom? No  Shower chair or bench in shower? Yes  Elevated toilet seat or a handicapped toilet? No   TIMED UP AND GO:  Was the test performed? No .  Cognitive Function: pt declined at this time         Immunizations Immunization History  Administered Date(s) Administered   Fluad Quad(high Dose 65+) 04/12/2019, 05/10/2020, 04/25/2021, 04/24/2022   Influenza Split 05/11/2012, 05/08/2015, 04/15/2018   Influenza Whole 05/11/2009   Influenza, High Dose Seasonal PF 05/08/2014   Influenza-Unspecified 05/11/2013, 04/26/2016, 05/07/2017   PFIZER(Purple Top)SARS-COV-2 Vaccination 09/15/2019, 10/11/2019, 05/29/2020   Pneumococcal Conjugate-13 10/21/2013   Pneumococcal Polysaccharide-23 02/18/2010, 09/04/2020   Td 02/18/2010   Zoster Recombinat (Shingrix) 03/13/2018, 05/13/2018   Zoster, Live 02/26/2011    TDAP status: Due, Education has been provided regarding the  importance of this vaccine. Advised may receive this vaccine at local pharmacy or Health Dept. Aware to provide a copy of the vaccination record if obtained from local pharmacy or Health Dept. Verbalized acceptance and understanding.  Flu Vaccine status: Up to date  Pneumococcal vaccine status: Up to date  Covid-19 vaccine status: Completed vaccines  Qualifies for Shingles Vaccine? Yes   Zostavax completed Yes   Shingrix Completed?: Yes  Screening Tests Health Maintenance  Topic Date Due   TETANUS/TDAP  02/19/2020   OPHTHALMOLOGY EXAM  02/19/2021   HEMOGLOBIN A1C  06/12/2022   FOOT EXAM  09/10/2022   Medicare Annual Wellness (AWV)  06/12/2023   Pneumonia Vaccine 65+ Years old  Completed   INFLUENZA VACCINE  Completed   DEXA SCAN  Completed   Zoster Vaccines- Shingrix  Completed   HPV VACCINES  Aged Out   COVID-19 Vaccine  Discontinued    Health Maintenance  Health Maintenance Due  Topic Date Due   TETANUS/TDAP  02/19/2020   OPHTHALMOLOGY EXAM  02/19/2021    Colorectal cancer screening: No longer required.   Mammogram status: No longer required due  to age.     Additional Screening:   Vision Screening: Recommended annual ophthalmology exams for early detection of glaucoma and other disorders of the eye. Is the patient up to date with their annual eye exam?  No  Who is the provider or what is the name of the office in which the patient attends annual eye exams? [facility]  If pt is not established with a provider, would they like to be referred to a provider to establish care? No .   Dental Screening: Recommended annual dental exams for proper oral hygiene  Community Resource Referral / Chronic Care Management: CRR required this visit?  No   CCM required this visit?  No      Plan:     I have personally reviewed and noted the following in the patient's chart:   Medical and social history Use of alcohol, tobacco or illicit drugs  Current medications  and supplements including opioid prescriptions. Patient is currently taking opioid prescriptions. Information provided to patient regarding non-opioid alternatives. Patient advised to discuss non-opioid treatment plan with their provider. Functional ability and status Nutritional status Physical activity Advanced directives List of other physicians Hospitalizations, surgeries, and ER visits in previous 12 months Vitals Screenings to include cognitive, depression, and falls Referrals and appointments  In addition, I have reviewed and discussed with patient certain preventive protocols, quality metrics, and best practice recommendations. A written personalized care plan for preventive services as well as general preventive health recommendations were provided to patient.     Willette Brace, LPN   [id number]   Nurse Notes: Pt declined stating she doesn't feel comfortable taking cognition test , "she know how to take her medications  & as long as she can take care of herself and husband she doesn't need this at this time"    I have reviewed and agree with [facility] documentation.  Kathlene November, MD

## 2022-06-17 ENCOUNTER — Ambulatory Visit: Payer: Medicare PPO | Admitting: Internal Medicine

## 2022-06-30 ENCOUNTER — Other Ambulatory Visit (HOSPITAL_COMMUNITY): Payer: Self-pay

## 2022-06-30 NOTE — Telephone Encounter (Signed)
Prolia VOB initiated via [email or web address] 

## 2022-06-30 NOTE — Telephone Encounter (Signed)
Pharmacy Patient Advocate Encounter  Insurance verification completed.    The patient is insured through [employer]   Ran test claims for: Prolia 60mg.  Pharmacy benefit copay: $64.00 

## 2022-06-30 NOTE — Telephone Encounter (Signed)
Forwarding to Rx Prior Auth Team 

## 2022-07-10 NOTE — Telephone Encounter (Signed)
Pt will get at her appointment on 07/25/22- Pt aware and voices understanding.

## 2022-07-10 NOTE — Telephone Encounter (Signed)
Pt ready for scheduling on or after 07/12/22   Out-of-pocket cost due at time of visit: $0   Primary: [employer] Prolia co-insurance: 0% Admin fee co-insurance: 0%    Secondary: n/a Prolia co-insurance:  Admin fee co-insurance:    Deductible: does not apply   Prior Auth: APPROVED PA# [id number] Valid: 06/21/21-08/10/22     ** This summary of benefits is an estimation of the patient's out-of-pocket cost. Exact cost may very based on individual plan coverage.

## 2022-07-15 ENCOUNTER — Ambulatory Visit: Payer: Medicare PPO | Attending: Cardiology | Admitting: Cardiology

## 2022-07-15 ENCOUNTER — Encounter: Payer: Self-pay | Admitting: Cardiology

## 2022-07-15 VITALS — BP 120/76 | HR 75 | Ht 62.0 in | Wt 161.0 lb

## 2022-07-15 DIAGNOSIS — I10 Essential (primary) hypertension: Secondary | ICD-10-CM | POA: Diagnosis not present

## 2022-07-15 DIAGNOSIS — E7849 Other hyperlipidemia: Secondary | ICD-10-CM | POA: Diagnosis not present

## 2022-07-15 DIAGNOSIS — I2510 Atherosclerotic heart disease of native coronary artery without angina pectoris: Secondary | ICD-10-CM

## 2022-07-15 DIAGNOSIS — M7989 Other specified soft tissue disorders: Secondary | ICD-10-CM | POA: Diagnosis not present

## 2022-07-15 DIAGNOSIS — E119 Type 2 diabetes mellitus without complications: Secondary | ICD-10-CM | POA: Diagnosis not present

## 2022-07-15 NOTE — Progress Notes (Signed)
Cardiology Office Note:    Date:  07/15/2022   ID:  [name], DOB [birth year], MRN [id number]  PCP:  Colon Branch, MD  Cardiologist:  Jenne Campus, MD    Referring MD: Colon Branch, MD   Chief Complaint  Patient presents with   Follow-up  Doing fine  History of Present Illness:    Kathleen Thompson is a 88 y.o. female with past medical history significant for coronary artery disease apparently in 2014 May she had cardiac catheterization with PTCA and stenting of the artery however have no documentation of this event.  Also history of TIA did happen many years ago she was put on Plavix since then.  Additionally essential hypertension dyslipidemia.  She was referred to [place] because of swelling of lower extremities.  All cardiac workup so far been negative.  Echocardiogram showed preserved ejection fraction proBNP is normal, she did have DVT study which was negative.  She has been managed with diuretic on as-needed basis. She comes today to months for follow-up overall doing very well.  Still does exercise on the regular basis with no difficulties.  She denies have any chest pain tightness squeezing pressure burning chest and leg somewhat swollen.  Past Medical History:  Diagnosis Date   Adenomatous colon polyp 02/2003   Arthritis    Blood transfusion    Cataract    Diverticulosis    Esophageal stricture    Gastroparesis    GERD (gastroesophageal reflux disease)    Headache    Hyperlipidemia    Hypertension    Hypothyroidism    Internal hemorrhoid    Iron deficiency anemia    Osteoporosis    PONV (postoperative nausea and vomiting)    PVD (peripheral vascular disease) (HCC)    Stroke (HCC)    TIA   TIA (transient ischemic attack)     Past Surgical History:  Procedure Laterality Date   ABDOMINAL HYSTERECTOMY     APPENDECTOMY     CERVICAL FUSION     EYE SURGERY Bilateral 08/2016   cataracts   LEFT HEART CATHETERIZATION WITH CORONARY ANGIOGRAM N/A 12/20/2012    Procedure: LEFT HEART CATHETERIZATION WITH CORONARY ANGIOGRAM;  Surgeon: Peter M Martinique, MD;  Location: [facility] CATH LAB;  Service: Cardiovascular;  Laterality: N/A;   shoulder surgery      VIDEO ASSISTED THORACOSCOPY (VATS)/WEDGE RESECTION Right 03/14/2013   Procedure: VIDEO ASSISTED THORACOSCOPY (VATS)/WEDGE RESECTION;  Surgeon: Melrose Nakayama, MD;  Location: [place];  Service: Thoracic;  Laterality: Right;    Current Medications: Current Meds  Medication Sig   aspirin 81 MG tablet Take 81 mg by mouth daily.   CALCIUM PO Take 1 tablet by mouth 2 (two) times daily.   clopidogrel (PLAVIX) 75 MG tablet Take 1 tablet (75 mg total) by mouth daily.   Coenzyme Q10 (CO Q-10 PO) Take 1 tablet by mouth daily.   ezetimibe (ZETIA) 10 MG tablet Take 1 tablet (10 mg total) by mouth daily.   furosemide (LASIX) 20 MG tablet Take 1 tablet ('20mg'$ ) by mouth daily alternating with 40 mg every other day. (Patient taking differently: Take 20-40 mg by mouth See admin instructions. Take 1 tablet ('20mg'$ ) by mouth daily alternating with 40 mg every other day.)   isosorbide mononitrate (IMDUR) 30 MG 24 hr tablet Take 1 tablet by mouth once daily (Patient taking differently: Take 30 mg by mouth daily.)   levothyroxine (SYNTHROID) 50 MCG tablet TAKE 1 TABLET BY MOUTH ONCE DAILY BEFORE BREAKFAST  losartan (COZAAR) 50 MG tablet Take 1 tablet (50 mg total) by mouth daily.   magnesium 30 MG tablet Take 30 mg by mouth daily.   pantoprazole (PROTONIX) 40 MG tablet TAKE 1 TABLET BY MOUTH ONCE DAILY BEFORE BREAKFAST (Patient taking differently: Take 40 mg by mouth daily.)   rosuvastatin (CRESTOR) 40 MG tablet Take 1 tablet (40 mg total) by mouth at bedtime.   traMADol (ULTRAM) 50 MG tablet Take 1 tablet (50 mg total) by mouth every 12 (twelve) hours as needed for moderate pain or severe pain.   vitamin D, CHOLECALCIFEROL, 400 UNITS tablet Take 400 Units by mouth daily.     Allergies:   Clindamycin/lincomycin, Cymbalta [duloxetine  hcl], Gabapentin, Hydrocodone, Rofecoxib, and Latex   Social History   Socioeconomic History   Marital status: Married    Spouse name: Not on file   Number of children: 2   Years of education: 11th grade   Highest education level: Not on file  Occupational History   Occupation: Retired-[employer]     Employer: RETIRED  Tobacco Use   Smoking status: Never   Smokeless tobacco: Never  Substance and Sexual Activity   Alcohol use: No   Drug use: No   Sexual activity: Yes  Other Topics Concern   Not on file  Social History Narrative   Household-- pt and wife   2 boys , one in [place], on in [place]    No surviving sibling out of seven.   One cup coffee with 1/2 caffeine.   Right-handed.   Social Determinants of Health   Financial Resource Strain: Low Risk  (06/11/2022)   Overall Financial Resource Strain (CARDIA)    Difficulty of Paying Living Expenses: Not hard at all  Food Insecurity: No Food Insecurity (06/11/2022)   Hunger Vital Sign    Worried About Running Out of Food in the Last Year: Never true    Ran Out of Food in the Last Year: Never true  Transportation Needs: No Transportation Needs (06/11/2022)   PRAPARE - Hydrologist (Medical): No    Lack of Transportation (Non-Medical): No  Physical Activity: Insufficiently Active (06/11/2022)   Exercise Vital Sign    Days of Exercise per Week: 1 day    Minutes of Exercise per Session: 30 min  Stress: No Stress Concern Present (06/11/2022)   [place]    Feeling of Stress : Not at all  Social Connections: Moderately Integrated (06/11/2022)   Social Connection and Isolation Panel [NHANES]    Frequency of Communication with Friends and Family: More than three times a week    Frequency of Social Gatherings with Friends and Family: More than three times a week    Attends Religious Services: 1 to 4 times per year    Active Member of [employer] or  Organizations: No    Attends Music therapist: Never    Marital Status: Married     Family History: The patient's family history includes Asthma in her father; Colon cancer in her brother and sister; Diabetes in her maternal aunt, maternal uncle, and mother; Heart attack in her brother, brother, brother, brother, father, mother, sister, and sister. There is no history of Breast cancer. ROS:   Please see the history of present illness.    All 14 point review of systems negative except as described per history of present illness  EKGs/Labs/Other Studies Reviewed:      Recent Labs:  09/10/2021: Hemoglobin 14.2; Platelets 260.0 12/10/2021: ALT 15; TSH 1.81 04/21/2022: NT-Pro BNP 198 05/09/2022: BUN 10; Creatinine, Ser 0.93; Potassium 4.2; Sodium 136  Recent Lipid Panel    Component Value Date/Time   CHOL 203 (H) 04/21/2022 1306   TRIG 139 04/21/2022 1306   TRIG 132 06/10/2006 1139   HDL 78 04/21/2022 1306   CHOLHDL 2.6 04/21/2022 1306   CHOLHDL 3 02/25/2022 1404   VLDL 33.2 02/25/2022 1404   LDLCALC 101 (H) 04/21/2022 1306   LDLDIRECT 79.0 12/10/2021 1445    Physical Exam:    VS:  BP 120/76 (BP Location: Right Arm, Patient Position: Sitting)   Pulse 75   Ht '5\' 2"'$  (1.575 m)   Wt 161 lb (73 kg)   SpO2 97%   BMI 29.45 kg/m     Wt Readings from Last 3 Encounters:  07/15/22 161 lb (73 kg)  06/11/22 164 lb (74.4 kg)  04/24/22 164 lb 8 oz (74.6 kg)     GEN:  Well nourished, well developed in no acute distress HEENT: Normal NECK: No JVD; No carotid bruits LYMPHATICS: No lymphadenopathy CARDIAC: RRR, no murmurs, no rubs, no gallops RESPIRATORY:  Clear to auscultation without rales, wheezing or rhonchi  ABDOMEN: Soft, non-tender, non-distended MUSCULOSKELETAL:  No edema; No deformity  SKIN: Warm and dry LOWER EXTREMITIES: no swelling NEUROLOGIC:  Alert and oriented x 3 PSYCHIATRIC:  Normal affect   ASSESSMENT:    1. Coronary artery disease involving native  coronary artery of native heart without angina pectoris   2. Primary hypertension   3. Diabetes mellitus without complication ([place])   4. Other hyperlipidemia   5. Swelling of both lower extremities    PLAN:    In order of problems listed above:  Coronary disease stable from that point review.  She is on appropriate medication which include antiplatelets therapy she is actually dual antiplatelet therapy as well as she is on Zetia and Crestor. Dyslipidemia I did review her K PN which show LDL of 101 I wish to be LDL less than 70 but she exercise on the regular basis her HDL is very high and she is reluctant to go on additional medication.  I think neck step would be to use PCSK9 agent. Essential hypertension blood pressure well-controlled continue present management. Swelling of lower extremities: Looks good.  Continue taking the Lasix on as-needed basis   Medication Adjustments/Labs and Tests Ordered: Current medicines are reviewed at length with the patient today.  Concerns regarding medicines are outlined above.  Orders Placed This Encounter  Procedures   EKG 12-Lead   Medication changes: No orders of the defined types were placed in this encounter.   Signed, Park Liter, MD, [facility] 07/15/2022 3:20 PM     [facility]

## 2022-07-15 NOTE — Patient Instructions (Signed)

## 2022-07-25 ENCOUNTER — Ambulatory Visit: Payer: Medicare PPO | Admitting: Internal Medicine

## 2022-07-25 ENCOUNTER — Encounter: Payer: Self-pay | Admitting: Internal Medicine

## 2022-07-25 VITALS — BP 122/84 | HR 83 | Temp 97.9°F | Resp 16 | Ht 62.0 in | Wt 161.0 lb

## 2022-07-25 DIAGNOSIS — M810 Age-related osteoporosis without current pathological fracture: Secondary | ICD-10-CM | POA: Diagnosis not present

## 2022-07-25 DIAGNOSIS — E119 Type 2 diabetes mellitus without complications: Secondary | ICD-10-CM | POA: Diagnosis not present

## 2022-07-25 DIAGNOSIS — E1142 Type 2 diabetes mellitus with diabetic polyneuropathy: Secondary | ICD-10-CM | POA: Diagnosis not present

## 2022-07-25 DIAGNOSIS — R609 Edema, unspecified: Secondary | ICD-10-CM | POA: Diagnosis not present

## 2022-07-25 DIAGNOSIS — E039 Hypothyroidism, unspecified: Secondary | ICD-10-CM | POA: Diagnosis not present

## 2022-07-25 DIAGNOSIS — M159 Polyosteoarthritis, unspecified: Secondary | ICD-10-CM | POA: Diagnosis not present

## 2022-07-25 DIAGNOSIS — Z79899 Other long term (current) drug therapy: Secondary | ICD-10-CM | POA: Diagnosis not present

## 2022-07-25 MED ORDER — DENOSUMAB 60 MG/ML ~~LOC~~ SOSY
60.0000 mg | PREFILLED_SYRINGE | Freq: Once | SUBCUTANEOUS | Status: AC
  Administered 2022-07-25: 60 mg via SUBCUTANEOUS

## 2022-07-25 NOTE — Patient Instructions (Addendum)
Vaccines I recommend:  Tdap (tetanus) RSV vaccine Covid vaccine   Check the  blood pressure regularly BP GOAL is between 110/65 and  135/85. If it is consistently higher or lower, let me know    GO TO THE LAB : Get the blood work     [place], [place] back for   a physical exam in about 3 to 4 months.    Per our records you are due for your diabetic eye exam. Please contact your eye doctor to schedule an appointment. Please have them send copies of your office visit notes to [place]. Our fax number is [phone]. If you need a referral to an eye doctor please let us know.

## 2022-07-25 NOTE — Progress Notes (Unsigned)
Subjective:    Patient ID: Kathleen Thompson, female    DOB: 1933/12/03, 88 y.o.   MRN: [id number]  DOS:  07/25/2022 Type of visit - description: ROV  Since the last office visit saw cardiology, notes reviewed. In general she feels well. Lower extremity edema better Neuropathy: Coping with it. We talked about her chronic medical problems as well as vaccinations.  Wt Readings from Last 3 Encounters:  07/25/22 161 lb (73 kg)  07/15/22 161 lb (73 kg)  06/11/22 164 lb (74.4 kg)     Review of Systems See above   Past Medical History:  Diagnosis Date   Adenomatous colon polyp 02/2003   Arthritis    Blood transfusion    Cataract    Diverticulosis    Esophageal stricture    Gastroparesis    GERD (gastroesophageal reflux disease)    Headache    Hyperlipidemia    Hypertension    Hypothyroidism    Internal hemorrhoid    Iron deficiency anemia    Osteoporosis    PONV (postoperative nausea and vomiting)    PVD (peripheral vascular disease) (HCC)    Stroke (HCC)    TIA   TIA (transient ischemic attack)     Past Surgical History:  Procedure Laterality Date   ABDOMINAL HYSTERECTOMY     APPENDECTOMY     CERVICAL FUSION     EYE SURGERY Bilateral 08/2016   cataracts   LEFT HEART CATHETERIZATION WITH CORONARY ANGIOGRAM N/A 12/20/2012   Procedure: LEFT HEART CATHETERIZATION WITH CORONARY ANGIOGRAM;  Surgeon: Peter M Martinique, MD;  Location: [facility] CATH LAB;  Service: Cardiovascular;  Laterality: N/A;   shoulder surgery      VIDEO ASSISTED THORACOSCOPY (VATS)/WEDGE RESECTION Right 03/14/2013   Procedure: VIDEO ASSISTED THORACOSCOPY (VATS)/WEDGE RESECTION;  Surgeon: Melrose Nakayama, MD;  Location: [place];  Service: Thoracic;  Laterality: Right;    Current Outpatient Medications  Medication Instructions   aspirin 81 mg, Daily   CALCIUM PO 1 tablet, 2 times daily   clopidogrel (PLAVIX) 75 mg, Oral, Daily   Coenzyme Q10 (CO Q-10 PO) 1 tablet, Daily   ezetimibe (ZETIA) 10 mg, Oral,  Daily   furosemide (LASIX) 20 MG tablet Take 1 tablet ('20mg'$ ) by mouth daily alternating with 40 mg every other day.   isosorbide mononitrate (IMDUR) 30 MG 24 hr tablet Take 1 tablet by mouth once daily   levothyroxine (SYNTHROID) 50 mcg, Oral, Daily before breakfast   losartan (COZAAR) 50 mg, Oral, Daily   magnesium 30 mg, Oral, Daily,     pantoprazole (PROTONIX) 40 MG tablet TAKE 1 TABLET BY MOUTH ONCE DAILY BEFORE BREAKFAST   rosuvastatin (CRESTOR) 40 mg, Oral, Daily at bedtime   traMADol (ULTRAM) 50 mg, Oral, Every 12 hours PRN   vitamin D (CHOLECALCIFEROL) 400 Units, Oral, Daily,         Objective:   Physical Exam BP 122/84   Pulse 83   Temp 97.9 F (36.6 C) (Oral)   Resp 16   Ht '5\' 2"'$  (1.575 m)   Wt 161 lb (73 kg)   SpO2 95%   BMI 29.45 kg/m  General:   Well developed, NAD, BMI noted. HEENT:  Normocephalic . Face symmetric, atraumatic Lungs:  CTA B Normal respiratory effort, no intercostal retractions, no accessory muscle use. Heart: RRR,  no murmur.  DM foot exam: + peri-ankle edema more on the left, good pedal pulses bilaterally, pinprick examination normal, toes  warm and well perfused Skin: Not pale. Not  jaundice Neurologic:  alert & oriented X3.  Speech normal, gait appropriate for age and unassisted Psych--  Cognition and judgment appear intact.  Cooperative with normal attention span and concentration.  Behavior appropriate. No anxious or depressed appearing.      Assessment     Assessment DM: DX 05-2019, A1c 6.6.  HTN Hypothyroidism Hyperlipidemia CV: --CAD cath 12/2012 single-vessel CAD,rx CV RF control, Rx Plavix, imdur --TIA remotely , apparently amaurosis fugax, was a started on on plavix per PCP, no further sx since. 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 Cardiology recommended Lasix 20 mg alternating with 40 mg.  Follow-up BMP was okay. Since then edema is not a major issue. DM: Diet controlled, check A1c.  She has neuropathy, feet exam performed today. Hypothyroidism: On Synthroid, check TSH, further advised for results. Hyperlipidemia: On Zetia, Crestor, up-to-date on FLP's. Neuropathy: Painful type, she is coping with it okay, she figured out that applying cold compresses to his feet prior to bedtime helps. Osteoporosis: Prolia shot today Pain management: Check UDS Preventive care: Benefits of RSV, Tdap and COVID show discussed RTC 3 to 4 months CPX   9= Edema: Since LOV, saw cardiology last week, they noted a recent echo show normal EF, rx a  venous duplex of the legs mostly to look into venous insufficiency and to rule out DVT.  BMP was normal, LDL 101, proBNP normal.  They were considering rule out OSA. At this point, with a changes on medications at the last visit she is slightly better. Further adjustment of meds per cards, pending LE [place]. HTN BP was 154/80 when she saw cardiology, BP today is excellent. Plan: Continue Lasix, Imdur, losartan.  Monitor BPs  High cholesterol: Last LDL was slightly elevated, previous LDLs were better.  For now recommend to continue with high-dose rosuvastatin and Zetia pending cardiology opinion. Preventive care: Flu shot today Explained benefits of COVID booster, states she will do it. RTC 3 months

## 2022-07-26 LAB — HEMOGLOBIN A1C
Hgb A1c MFr Bld: 6.5 % of total Hgb — ABNORMAL HIGH (ref <5.7)
Mean Plasma Glucose: 140 mg/dL
eAG (mmol/L): 7.7 mmol/L

## 2022-07-26 LAB — CBC WITH DIFFERENTIAL/PLATELET
Absolute Monocytes: 473 cells/uL (ref 200–950)
Basophils Absolute: 38 cells/uL (ref 0–200)
Basophils Relative: 0.6 %
Eosinophils Absolute: 132 cells/uL (ref 15–500)
Eosinophils Relative: 2.1 %
HCT: 42.8 % (ref 35.0–45.0)
Hemoglobin: 14.9 g/dL (ref 11.7–15.5)
Lymphs Abs: 2829 cells/uL (ref 850–3900)
MCH: 31.8 pg (ref 27.0–33.0)
MCHC: 34.8 g/dL (ref 32.0–36.0)
MCV: 91.3 fL (ref 80.0–100.0)
MPV: 9 fL (ref 7.5–12.5)
Monocytes Relative: 7.5 %
Neutro Abs: 2829 cells/uL (ref 1500–7800)
Neutrophils Relative %: 44.9 %
Platelets: 217 10*3/uL (ref 140–400)
RBC: 4.69 10*6/uL (ref 3.80–5.10)
RDW: 12.2 % (ref 11.0–15.0)
Total Lymphocyte: 44.9 %
WBC: 6.3 10*3/uL (ref 3.8–10.8)

## 2022-07-26 LAB — TSH: TSH: 2.73 mIU/L (ref 0.40–4.50)

## 2022-07-26 NOTE — Assessment & Plan Note (Signed)
Edema: Since the last visit was eval by cardiology: Doppler US 04-2022: No DVT. Cardiology recommended Lasix 20 mg alternating with 40 mg.  Follow-up BMP was okay. Since then edema is not a major issue. DM: Diet controlled, check A1c.  She has neuropathy, feet exam performed today. Neuropathy: Painful type, she is coping with it okay, she figured out that applying cold compresses to his feet prior to bedtime helps. Hypothyroidism: On Synthroid, check TSH, further advised for results. Hyperlipidemia: On Zetia, Crestor, up-to-date on FLP's. Osteoporosis: Prolia shot today Pain management: Check UDS Preventive care: Benefits of RSV, Tdap and COVID show discussed RTC 3 to 4 months CPX

## 2022-07-27 LAB — DRUG MONITORING PANEL 375977 , URINE
Alcohol Metabolites: NEGATIVE ng/mL (ref <500)
Amphetamines: NEGATIVE ng/mL (ref <500)
Barbiturates: NEGATIVE ng/mL (ref <300)
Benzodiazepines: NEGATIVE ng/mL (ref <100)
Cocaine Metabolite: NEGATIVE ng/mL (ref <150)
Desmethyltramadol: 2882 ng/mL — ABNORMAL HIGH (ref <100)
Marijuana Metabolite: NEGATIVE ng/mL (ref <20)
Opiates: NEGATIVE ng/mL (ref <100)
Oxycodone: NEGATIVE ng/mL (ref <100)
Tramadol: 4953 ng/mL — ABNORMAL HIGH (ref <100)

## 2022-07-27 LAB — DM TEMPLATE

## 2022-07-31 ENCOUNTER — Telehealth: Payer: Self-pay

## 2022-07-31 NOTE — Telephone Encounter (Signed)
Due 01/23/2022.

## 2022-07-31 NOTE — Telephone Encounter (Signed)
-----   Message from [place], [place] sent at 07/25/2022  2:34 PM EST ----- Regarding: Prolia Pt received Prolia today, 07/25/22

## 2022-08-03 ENCOUNTER — Other Ambulatory Visit: Payer: Self-pay | Admitting: Internal Medicine

## 2022-08-05 MED ORDER — TRAMADOL HCL 50 MG PO TABS
ORAL_TABLET | ORAL | 0 refills | Status: DC
Start: 2022-08-05 — End: 2022-10-20

## 2022-08-05 NOTE — Telephone Encounter (Signed)
PDMP okay, Rx sent 

## 2022-08-05 NOTE — Telephone Encounter (Signed)
Requesting: Tramadol  Contract: 09/10/20 UDS: 09/04/20 Last Visit: 07/25/22 Next Visit: 12/16/22 Last Refill:06/02/22  Please Advise

## 2022-08-15 ENCOUNTER — Other Ambulatory Visit: Payer: Self-pay | Admitting: Family

## 2022-08-18 ENCOUNTER — Other Ambulatory Visit: Payer: Self-pay

## 2022-08-18 MED ORDER — LEVOTHYROXINE SODIUM 50 MCG PO TABS: 50.0000 ug | ORAL_TABLET | Freq: Every day | ORAL | 1 refills | Status: DC

## 2022-10-04 ENCOUNTER — Other Ambulatory Visit: Payer: Self-pay | Admitting: Internal Medicine

## 2022-10-20 ENCOUNTER — Telehealth: Payer: Self-pay | Admitting: Internal Medicine

## 2022-10-20 NOTE — Telephone Encounter (Signed)
Requesting: tramadol '50mg'$   Contract: 08/07/22 UDS: 07/25/22 Last Visit: 07/25/22 Next Visit: 12/16/22 Last Refill: 08/05/22 #60 and [id number]  Please Advise

## 2022-10-20 NOTE — Telephone Encounter (Signed)
Pdmp ok, sent  

## 2022-10-24 ENCOUNTER — Other Ambulatory Visit: Payer: Self-pay | Admitting: Internal Medicine

## 2022-11-21 ENCOUNTER — Other Ambulatory Visit: Payer: Self-pay | Admitting: Internal Medicine

## 2022-11-24 ENCOUNTER — Telehealth: Payer: Self-pay

## 2022-11-24 NOTE — Telephone Encounter (Signed)
Received call from Joselyn Glassman, pharmacist at [facility]- needed last OV date, dx code and needed to know if changes in tramadol prescription in the last few months. Information given.

## 2022-12-16 ENCOUNTER — Encounter: Payer: Self-pay | Admitting: Internal Medicine

## 2022-12-16 ENCOUNTER — Ambulatory Visit: Payer: Medicare PPO | Admitting: Internal Medicine

## 2022-12-16 VITALS — BP 136/76 | HR 79 | Temp 98.0°F | Resp 18 | Ht 62.0 in | Wt 166.0 lb

## 2022-12-16 DIAGNOSIS — E039 Hypothyroidism, unspecified: Secondary | ICD-10-CM | POA: Diagnosis not present

## 2022-12-16 DIAGNOSIS — I10 Essential (primary) hypertension: Secondary | ICD-10-CM

## 2022-12-16 DIAGNOSIS — E1142 Type 2 diabetes mellitus with diabetic polyneuropathy: Secondary | ICD-10-CM | POA: Diagnosis not present

## 2022-12-16 MED ORDER — PREGABALIN 25 MG PO CAPS: 25.0000 mg | ORAL_CAPSULE | Freq: Two times a day (BID) | ORAL | 3 refills | Status: DC

## 2022-12-16 NOTE — Patient Instructions (Addendum)
For neuropathy: Continue cool your feet as you are doing Start Lyrica 25 mg: 1 tablet daily for few days then 1 tablet twice daily. Let me know in 1 month how this is working, we can always go up on the dose if needed.  Vaccines I recommend: Tdap (tetanus) RSV vaccine COVID booster if not done by 04-2022     GO TO THE LAB : Get the blood work     GO TO THE FRONT DESK, PLEASE SCHEDULE YOUR APPOINTMENTS Come back for a physical exam in 3 months

## 2022-12-16 NOTE — Progress Notes (Unsigned)
Subjective:    Patient ID: Kathleen Thompson, female    DOB: 02/10/1934, 89 y.o.   MRN: [id number]  DOS:  12/16/2022 Type of visit - description: f/u  Here for follow-up. In general feels great except that she is really frustrated by her neuropathy symptoms. They are getting worse, described as a burning sensation only relieved temporarily by cooling her feet. Denies claudication per se No major problems with low back pain.  Review of Systems See above   Past Medical History:  Diagnosis Date   Adenomatous colon polyp 02/2003   Arthritis    Blood transfusion    Cataract    Diverticulosis    Esophageal stricture    Gastroparesis    GERD (gastroesophageal reflux disease)    Headache    Hyperlipidemia    Hypertension    Hypothyroidism    Internal hemorrhoid    Iron deficiency anemia    Osteoporosis    PONV (postoperative nausea and vomiting)    PVD (peripheral vascular disease) (HCC)    Stroke (HCC)    TIA   TIA (transient ischemic attack)     Past Surgical History:  Procedure Laterality Date   ABDOMINAL HYSTERECTOMY     APPENDECTOMY     CERVICAL FUSION     EYE SURGERY Bilateral 08/2016   cataracts   LEFT HEART CATHETERIZATION WITH CORONARY ANGIOGRAM N/A 12/20/2012   Procedure: LEFT HEART CATHETERIZATION WITH CORONARY ANGIOGRAM;  Surgeon: Peter M Swaziland, MD;  Location: [facility] CATH LAB;  Service: Cardiovascular;  Laterality: N/A;   shoulder surgery      VIDEO ASSISTED THORACOSCOPY (VATS)/WEDGE RESECTION Right 03/14/2013   Procedure: VIDEO ASSISTED THORACOSCOPY (VATS)/WEDGE RESECTION;  Surgeon: Loreli Slot, MD;  Location: [facility] OR;  Service: Thoracic;  Laterality: Right;    Current Outpatient Medications  Medication Instructions   aspirin 81 mg, Daily   CALCIUM PO 1 tablet, 2 times daily   clopidogrel (PLAVIX) 75 mg, Oral, Daily   Coenzyme Q10 (CO Q-10 PO) 1 tablet, Daily   ezetimibe (ZETIA) 10 mg, Oral, Daily   furosemide (LASIX) 20 MG tablet Take 1 tablet (20mg )  by mouth daily alternating with 40 mg every other day.   isosorbide mononitrate (IMDUR) 30 MG 24 hr tablet Take 1 tablet by mouth once daily   levothyroxine (SYNTHROID) 50 mcg, Oral, Daily before breakfast   losartan (COZAAR) 50 mg, Oral, Daily   magnesium 30 mg, Oral, Daily,     pantoprazole (PROTONIX) 40 MG tablet TAKE 1 TABLET BY MOUTH ONCE DAILY BEFORE BREAKFAST   rosuvastatin (CRESTOR) 40 mg, Oral, Daily at bedtime   traMADol (ULTRAM) 50 MG tablet TAKE 1 TABLET BY MOUTH EVERY 12 HOURS AS NEEDED FOR  MODERATE  PAIN  TO  SEVERE  PAIN   vitamin D (CHOLECALCIFEROL) 400 Units, Oral, Daily,         Objective:   Physical Exam BP 136/76   Pulse 79   Temp 98 F (36.7 C) (Oral)   Resp 18   Ht 5\' 2"  (1.575 m)   Wt 166 lb (75.3 kg)   SpO2 96%   BMI 30.36 kg/m  General:   Well developed, NAD, BMI noted. HEENT:  Normocephalic . Face symmetric, atraumatic Lungs:  CTA B Normal respiratory effort, no intercostal retractions, no accessory muscle use. Heart: RRR,  no murmur.  Lower extremities: +/+++ pretibial edema bilaterally  DM foot exam: Good pedal pulses bilaterally, toes well-perfused.  Pinprick examination slightly decreased distally Neurologic:  alert & oriented  X3.  Speech normal, gait appropriate for age and unassisted Psych--  Cognition and judgment appear intact.  Cooperative with normal attention span and concentration.  Behavior appropriate. No anxious or depressed appearing.      Assessment     Assessment DM: DX 05-2019, A1c 6.6.  HTN Hypothyroidism Hyperlipidemia CV: --CAD cath 12/2012 single-vessel CAD,rx CV RF control, Rx Plavix, imdur --TIA remotely , apparently amaurosis fugax, was a started on on plavix per PCP, no further sx since. 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 HTN: On Lasix, intermittent, losartan.  Check a BMP Edema, seems relatively well-controlled. Neuropathy: This was the main focus of the visit, the patient is extremely frustrated about it.  Extensive listening therapy provided, she is discouraged because she thinks nothing else can be done. Advised patient that we could try different medications and eventually refer to neurology. On chart review, etiologies other than diabetes were ruled out with normal labs (WNL B12, folic acid, SPEP, ANA, sed rate, RPR).  Intolerant to gabapentin and Cymbalta Plan: Trial with Lyrica 25 mg twice daily.  Increase dose if needed. Hypothyroidism: Check TSH Vaccine advice provided: Tdap, RSV, COVID booster if not done by 04-2022. RTC 3 months CPX   ==== Edema: Since the last visit was eval by cardiology: Doppler US 04-2022: No DVT. Cardiology recommended Lasix 20 mg alternating with 40 mg.  Follow-up BMP was okay. Since then edema is not a major issue. DM: Diet controlled, check A1c.  She has neuropathy, feet exam performed today. Neuropathy: Painful type, she is coping with it okay, she figured out that applying cold compresses to his feet prior to bedtime helps. Hypothyroidism: On Synthroid, check TSH, further advised for results. Hyperlipidemia: On Zetia, Crestor, up-to-date on FLP's. Osteoporosis: Prolia shot today Pain management: Check UDS Preventive care: Benefits of RSV, Tdap and COVID show discussed RTC 3 to 4 months CPX

## 2022-12-17 LAB — BASIC METABOLIC PANEL
BUN: 14 mg/dL (ref 6–23)
CO2: 28 mEq/L (ref 19–32)
Calcium: 9.3 mg/dL (ref 8.4–10.5)
Chloride: 101 mEq/L (ref 96–112)
Creatinine, Ser: 1 mg/dL (ref 0.40–1.20)
GFR: 50.92 mL/min — ABNORMAL LOW (ref >60.00)
Glucose, Bld: 113 mg/dL — ABNORMAL HIGH (ref 70–99)
Potassium: 3.9 mEq/L (ref 3.5–5.1)
Sodium: 139 mEq/L (ref 135–145)

## 2022-12-17 LAB — HEMOGLOBIN A1C: Hgb A1c MFr Bld: 6.7 % — ABNORMAL HIGH (ref 4.6–6.5)

## 2022-12-17 LAB — TSH: TSH: 1.46 u[IU]/mL (ref 0.35–5.50)

## 2022-12-17 NOTE — Assessment & Plan Note (Signed)
PLAN DM: Diet controlled, Check A1c. HTN: On Lasix, imdur, losartan.  Check a BMP Edema, seems relatively well-controlled. Neuropathy: This was the main focus of the visit, the patient is extremely frustrated about it.  Extensive listening therapy provided, she is discouraged because she thinks nothing else can be done. Advised patient that we could try different medications and eventually refer to neurology. On chart review, etiologies other than diabetes were ruled out with normal labs (WNL B12, folic acid, SPEP, ANA, sed rate, RPR).  Intolerant to gabapentin and Cymbalta Plan: Trial with Lyrica 25 mg twice daily.  Increase dose if needed. Hypothyroidism: Check TSH Vaccine advice provided: Tdap, RSV, COVID booster if not done by 04-2022. RTC 3 months CPX

## 2022-12-25 ENCOUNTER — Telehealth: Payer: Self-pay | Admitting: Cardiology

## 2022-12-25 MED ORDER — FUROSEMIDE 40 MG PO TABS: 40.0000 mg | ORAL_TABLET | ORAL | 1 refills | Status: DC

## 2022-12-25 NOTE — Telephone Encounter (Signed)
Pt is insistent that we send in a Rx for 40 mg of the Furosemide to alternate with the 20 mg. The pharmacist stated he has had this discussion with the pt before and has tried to explain how to take 2 of the 20 mg every other day but she really wants the 40 mg pill also. Pharmacist stated he would make sure the pt is using a pill box and is careful not to get the 2 pills mixed up.

## 2022-12-25 NOTE — Telephone Encounter (Signed)
Pt c/o medication issue:  1. Name of Medication:   furosemide (LASIX) 20 MG tablet  Furosemide 40mg  tablets  2. How are you currently taking this medication (dosage and times per day)? As written  3. Are you having a reaction (difficulty breathing--STAT)? no  4. What is your medication issue? Pharmacy called in on behalf of pt. They stated pt needs an rx for 40 mg tablets per her instructions. They only have an rx for 20mg  tablets. Please advise.

## 2022-12-30 NOTE — Telephone Encounter (Signed)
Please check EOB, pt due for next Prolia in 1 month.

## 2023-01-02 ENCOUNTER — Telehealth: Payer: Self-pay

## 2023-01-02 NOTE — Telephone Encounter (Signed)
Prolia VOB initiated via [email or web address]  Last Prolia inj: 07/25/22 Next Prolia inj DUE: 01/24/23

## 2023-01-02 NOTE — Telephone Encounter (Signed)
Created new encounter for Prolia BIV. Will route encounter back once benefit verification is complete.  

## 2023-01-15 ENCOUNTER — Other Ambulatory Visit (HOSPITAL_COMMUNITY): Payer: Self-pay

## 2023-01-15 NOTE — Telephone Encounter (Signed)
Placed a call to [facility] at [phone] to check benefits for Prolia.   $20 office copay, $0 admin fee, no deductible, prior authorization required  Per test claim, $64 copay with [facility]

## 2023-01-15 NOTE — Telephone Encounter (Signed)
Pharmacy Patient Advocate Encounter   Received notification from [facility] that prior authorization for Prolia is required/requested.   PA submitted to [facility] via CoverMyMeds Key # [id number] Status is pending

## 2023-01-16 NOTE — Telephone Encounter (Signed)
(  Key: [id number]) PA Case ID #: [id number] Need Help? Call us at [phone] Outcome Approved on June 6 PA Case: [id number], Status: Approved, Coverage Starts on: 06/21/2021 12:00:00 AM, Coverage Ends on: 08/11/2023 12:00:00 AM. Questions? Contact [phone]. Authorization Expiration Date: 08/10/2023

## 2023-01-19 ENCOUNTER — Other Ambulatory Visit (HOSPITAL_COMMUNITY): Payer: Self-pay

## 2023-01-19 NOTE — Telephone Encounter (Signed)
Pt ready for scheduling for PROLIA on or after : 01/24/23  Out-of-pocket cost due at time of visit: $20  Primary: [employer] Prolia co-insurance: $20 Admin fee co-insurance: $0  Secondary: --- Prolia co-insurance:  Admin fee co-insurance:   Medical Benefit Details: Date Benefits were checked: 01/15/23 Deductible: NO/ Coinsurance: $20/ Admin Fee: $0  Prior Auth: APPROVED PA# [id number] Expiration Date: 08/10/23   Pharmacy benefit: Copay $64 If patient wants fill through the pharmacy benefit please send prescription to: [employer], and include estimated need by date in rx notes. Pharmacy will ship medication directly to the office.  Patient NOT eligible for Prolia Copay Card. Copay Card can make patient's cost as little as $25. Link to apply: [email or web address]://[email or web address]  ** This summary of benefits is an estimation of the patient's out-of-pocket cost. Exact cost may very based on individual plan coverage.

## 2023-01-20 NOTE — Telephone Encounter (Signed)
Left message on machine to call back to schedule for prolia.  She is due on or after 01/25/23

## 2023-01-21 ENCOUNTER — Encounter: Payer: Self-pay | Admitting: Cardiology

## 2023-01-21 ENCOUNTER — Ambulatory Visit: Payer: Medicare PPO | Attending: Cardiology | Admitting: Cardiology

## 2023-01-21 VITALS — BP 124/82 | HR 76 | Ht 62.0 in | Wt 167.0 lb

## 2023-01-21 DIAGNOSIS — E1142 Type 2 diabetes mellitus with diabetic polyneuropathy: Secondary | ICD-10-CM

## 2023-01-21 DIAGNOSIS — I2510 Atherosclerotic heart disease of native coronary artery without angina pectoris: Secondary | ICD-10-CM

## 2023-01-21 DIAGNOSIS — E7849 Other hyperlipidemia: Secondary | ICD-10-CM | POA: Diagnosis not present

## 2023-01-21 MED ORDER — POTASSIUM CHLORIDE ER 10 MEQ PO TBCR: 10.0000 meq | EXTENDED_RELEASE_TABLET | Freq: Every day | ORAL | 3 refills | Status: DC

## 2023-01-21 MED ORDER — FUROSEMIDE 40 MG PO TABS: 40.0000 mg | ORAL_TABLET | Freq: Every day | ORAL | 3 refills | Status: DC

## 2023-01-21 NOTE — Patient Instructions (Signed)
Medication Instructions:  Your physician has recommended you make the following change in your medication:   START: Lasix 40 mg daily START: Potassium 10 meq daily  *If you need a refill on your cardiac medications before your next appointment, please call your pharmacy*   Lab Work: Your physician recommends that you return for lab work in:   Labs today: BMP  If you have labs (blood work) drawn today and your tests are completely normal, you will receive your results only by: MyChart Message (if you have MyChart) OR A paper copy in the mail If you have any lab test that is abnormal or we need to change your treatment, we will call you to review the results.   Testing/Procedures: None   Follow-Up: At [facility], you and your health needs are our priority.  As part of our continuing mission to provide you with exceptional heart care, we have created designated Provider Care Teams.  These Care Teams include your primary Cardiologist (physician) and Advanced Practice Providers (APPs -  Physician Assistants and Nurse Practitioners) who all work together to provide you with the care you need, when you need it.  We recommend signing up for the patient portal called "MyChart".  Sign up information is provided on this After Visit Summary.  MyChart is used to connect with patients for Virtual Visits (Telemedicine).  Patients are able to view lab/test results, encounter notes, upcoming appointments, etc.  Non-urgent messages can be sent to your provider as well.   To learn more about what you can do with MyChart, go to [email or web address].    Your next appointment:   6 month(s)  Provider:   Gypsy Balsam, MD    Other Instructions None

## 2023-01-21 NOTE — Addendum Note (Signed)
Addended by: Roxanne Mins I on: 01/21/2023 02:00 PM   Modules accepted: Orders

## 2023-01-21 NOTE — Progress Notes (Signed)
Cardiology Office Note:    Date:  01/21/2023   ID:  Kathleen Thompson, DOB 10-13-1933, MRN [id number]  PCP:  Wanda Plump, MD  Cardiologist:  Gypsy Balsam, MD    Referring MD: Wanda Plump, MD   Chief Complaint  Patient presents with   Follow-up    History of Present Illness:    Kathleen Thompson is a 89 y.o. female with past medical history significant for coronary artery disease.  In 2014 she got cardiac catheterization done and PTCA and stenting of artery however I have no documentation of this event so I do not know which artery has been stented.  Additional problem include history of TIA many years ago that she did not put on Plavix since she has been taking that medication since that time, essential hypertension, dyslipidemia.  She was referred to [place] because of swelling of lower extremities.  So far cardiac workup negative, echocardiogram showed preserved ejection fraction proBNP was normal DVT study has been negative it is being managed with diuretics.  She She comes today to months for follow-up overall she says she is doing great she does have a lot of energy she described how she works in the garden with no difficulties however her legs today appears to be quite significantly swollen.  Past Medical History:  Diagnosis Date   Adenomatous colon polyp 02/2003   Arthritis    Blood transfusion    Cataract    Diverticulosis    Esophageal stricture    Gastroparesis    GERD (gastroesophageal reflux disease)    Headache    Hyperlipidemia    Hypertension    Hypothyroidism    Internal hemorrhoid    Iron deficiency anemia    Osteoporosis    PONV (postoperative nausea and vomiting)    PVD (peripheral vascular disease) (HCC)    Stroke (HCC)    TIA   TIA (transient ischemic attack)     Past Surgical History:  Procedure Laterality Date   ABDOMINAL HYSTERECTOMY     APPENDECTOMY     CERVICAL FUSION     EYE SURGERY Bilateral 08/2016   cataracts   LEFT HEART CATHETERIZATION  WITH CORONARY ANGIOGRAM N/A 12/20/2012   Procedure: LEFT HEART CATHETERIZATION WITH CORONARY ANGIOGRAM;  Surgeon: Peter M Swaziland, MD;  Location: [facility] CATH LAB;  Service: Cardiovascular;  Laterality: N/A;   shoulder surgery      VIDEO ASSISTED THORACOSCOPY (VATS)/WEDGE RESECTION Right 03/14/2013   Procedure: VIDEO ASSISTED THORACOSCOPY (VATS)/WEDGE RESECTION;  Surgeon: Loreli Slot, MD;  Location: [facility] OR;  Service: Thoracic;  Laterality: Right;    Current Medications: Current Meds  Medication Sig   aspirin 81 MG tablet Take 81 mg by mouth daily.   CALCIUM PO Take 1 tablet by mouth 2 (two) times daily.   clopidogrel (PLAVIX) 75 MG tablet Take 1 tablet by mouth once daily   Coenzyme Q10 (CO Q-10 PO) Take 1 tablet by mouth daily.   ezetimibe (ZETIA) 10 MG tablet Take 1 tablet (10 mg total) by mouth daily.   furosemide (LASIX) 20 MG tablet Take 1 tablet (20mg ) by mouth daily alternating with 40 mg every other day. (Patient taking differently: Take 20-40 mg by mouth See admin instructions. Take 1 tablet (20mg ) by mouth daily alternating with 40 mg every other day.)   furosemide (LASIX) 40 MG tablet Take 1 tablet (40 mg total) by mouth every other day. On days not taking the 20 mg tablet   isosorbide mononitrate (IMDUR) 30  MG 24 hr tablet Take 1 tablet by mouth once daily (Patient taking differently: Take 30 mg by mouth daily.)   levothyroxine (SYNTHROID) 50 MCG tablet Take 1 tablet (50 mcg total) by mouth daily before breakfast.   losartan (COZAAR) 50 MG tablet Take 1 tablet by mouth once daily   magnesium 30 MG tablet Take 30 mg by mouth daily.   pantoprazole (PROTONIX) 40 MG tablet TAKE 1 TABLET BY MOUTH ONCE DAILY BEFORE BREAKFAST (Patient taking differently: Take 40 mg by mouth daily.)   pregabalin (LYRICA) 25 MG capsule Take 1 capsule (25 mg total) by mouth 2 (two) times daily.   rosuvastatin (CRESTOR) 40 MG tablet TAKE 1 TABLET BY MOUTH AT BEDTIME   traMADol (ULTRAM) 50 MG tablet TAKE 1  TABLET BY MOUTH EVERY 12 HOURS AS NEEDED FOR  MODERATE  PAIN  TO  SEVERE  PAIN (Patient taking differently: Take 50 mg by mouth every 12 (twelve) hours as needed for severe pain or moderate pain. TAKE 1 TABLET BY MOUTH EVERY 12 HOURS AS NEEDED FOR  MODERATE  PAIN  TO  SEVERE  PAIN)   vitamin D, CHOLECALCIFEROL, 400 UNITS tablet Take 400 Units by mouth daily.     Allergies:   Clindamycin/lincomycin, Cymbalta [duloxetine hcl], Gabapentin, Hydrocodone, Rofecoxib, and Latex   Social History   Socioeconomic History   Marital status: Married    Spouse name: Not on file   Number of children: 2   Years of education: 11th grade   Highest education level: Not on file  Occupational History   Occupation: Retired-[employer]     Employer: RETIRED  Tobacco Use   Smoking status: Never   Smokeless tobacco: Never  Substance and Sexual Activity   Alcohol use: No   Drug use: No   Sexual activity: Yes  Other Topics Concern   Not on file  Social History Narrative   Household-- pt and wife   2 boys , one in [place], on in [place]    No surviving sibling out of seven.   One cup coffee with 1/2 caffeine.   Right-handed.   Social Determinants of Health   Financial Resource Strain: Low Risk  (06/11/2022)   Overall Financial Resource Strain (CARDIA)    Difficulty of Paying Living Expenses: Not hard at all  Food Insecurity: No Food Insecurity (06/11/2022)   Hunger Vital Sign    Worried About Running Out of Food in the Last Year: Never true    Ran Out of Food in the Last Year: Never true  Transportation Needs: No Transportation Needs (06/11/2022)   PRAPARE - Administrator, Civil Service (Medical): No    Lack of Transportation (Non-Medical): No  Physical Activity: Insufficiently Active (06/11/2022)   Exercise Vital Sign    Days of Exercise per Week: 1 day    Minutes of Exercise per Session: 30 min  Stress: No Stress Concern Present (06/11/2022)   [employer] of Occupational Health - Occupational  Stress Questionnaire    Feeling of Stress : Not at all  Social Connections: Moderately Integrated (06/11/2022)   Social Connection and Isolation Panel [NHANES]    Frequency of Communication with Friends and Family: More than three times a week    Frequency of Social Gatherings with Friends and Family: More than three times a week    Attends Religious Services: 1 to 4 times per year    Active Member of [employer] or Organizations: No    Attends Banker Meetings: Never  Marital Status: Married     Family History: The patient's family history includes Asthma in her father; Colon cancer in her brother and sister; Diabetes in her maternal aunt, maternal uncle, and mother; Heart attack in her brother, brother, brother, brother, father, mother, sister, and sister. There is no history of Breast cancer. ROS:   Please see the history of present illness.    All 14 point review of systems negative except as described per history of present illness  EKGs/Labs/Other Studies Reviewed:      Recent Labs: 04/21/2022: NT-Pro BNP 198 07/25/2022: Hemoglobin 14.9; Platelets 217 12/16/2022: BUN 14; Creatinine, Ser 1.00; Potassium 3.9; Sodium 139; TSH 1.46  Recent Lipid Panel    Component Value Date/Time   CHOL 203 (H) 04/21/2022 1306   TRIG 139 04/21/2022 1306   TRIG 132 06/10/2006 1139   HDL 78 04/21/2022 1306   CHOLHDL 2.6 04/21/2022 1306   CHOLHDL 3 02/25/2022 1404   VLDL 33.2 02/25/2022 1404   LDLCALC 101 (H) 04/21/2022 1306   LDLDIRECT 79.0 12/10/2021 1445    Physical Exam:    VS:  BP 124/82 (BP Location: Left Arm, Patient Position: Sitting)   Pulse 76   Ht 5\' 2"  (1.575 m)   Wt 167 lb (75.8 kg)   SpO2 97%   BMI 30.54 kg/m     Wt Readings from Last 3 Encounters:  01/21/23 167 lb (75.8 kg)  12/16/22 166 lb (75.3 kg)  07/25/22 161 lb (73 kg)     GEN:  Well nourished, well developed in no acute distress HEENT: Normal NECK: No JVD; No carotid bruits LYMPHATICS: No  lymphadenopathy CARDIAC: RRR, no murmurs, no rubs, no gallops RESPIRATORY:  Clear to auscultation without rales, wheezing or rhonchi  ABDOMEN: Soft, non-tender, non-distended MUSCULOSKELETAL:  No edema; No deformity  SKIN: Warm and dry LOWER EXTREMITIES: 1-2+ swelling NEUROLOGIC:  Alert and oriented x 3 PSYCHIATRIC:  Normal affect   ASSESSMENT:    1. Coronary artery disease involving native coronary artery of native heart without angina pectoris   2. Type 2 diabetes mellitus with diabetic polyneuropathy, without long-term current use of insulin (HCC)   3. Other hyperlipidemia    PLAN:    In order of problems listed above:  Swelling of lower extremities advised with increased dose of Lasix to 40 mg daily, I will give her prescription for 10 mg of potassium, will do Chem-7 today we will repeat Chem-7 within the next week or so.  Will also check proBNP. Coronary artery disease stable on appropriate guideline directed medical therapy including antiplatelets therapy. Dyslipidemia I did review her K PN which showed LDL of 101 however HDL was 78 which is quite high I did talk about potentially switching her to a different medication she does not want to do it she is already on high intense statin Crestor and Zetia next step would be PCSK9 agent which she does not want to do   Medication Adjustments/Labs and Tests Ordered: Current medicines are reviewed at length with the patient today.  Concerns regarding medicines are outlined above.  No orders of the defined types were placed in this encounter.  Medication changes: No orders of the defined types were placed in this encounter.   Signed, Georgeanna Lea, MD, [facility] 01/21/2023 1:45 PM    [facility]

## 2023-01-22 LAB — BASIC METABOLIC PANEL
BUN/Creatinine Ratio: 12 (ref 12–28)
BUN: 11 mg/dL (ref 8–27)
CO2: 23 mmol/L (ref 20–29)
Calcium: 9.1 mg/dL (ref 8.7–10.3)
Chloride: 103 mmol/L (ref 96–106)
Creatinine, Ser: 0.94 mg/dL (ref 0.57–1.00)
Glucose: 95 mg/dL (ref 70–99)
Potassium: 4 mmol/L (ref 3.5–5.2)
Sodium: 141 mmol/L (ref 134–144)
eGFR: 59 mL/min/{1.73_m2} — ABNORMAL LOW (ref >59)

## 2023-01-23 ENCOUNTER — Other Ambulatory Visit: Payer: Self-pay | Admitting: Family

## 2023-01-23 DIAGNOSIS — H524 Presbyopia: Secondary | ICD-10-CM | POA: Diagnosis not present

## 2023-01-23 DIAGNOSIS — H5202 Hypermetropia, left eye: Secondary | ICD-10-CM | POA: Diagnosis not present

## 2023-01-23 LAB — HM DIABETES EYE EXAM

## 2023-01-26 ENCOUNTER — Telehealth: Payer: Self-pay

## 2023-01-26 MED ORDER — TRAMADOL HCL 50 MG PO TABS
50.0000 mg | ORAL_TABLET | Freq: Two times a day (BID) | ORAL | 0 refills | Status: DC | PRN
Start: 2023-01-26 — End: 2023-05-05

## 2023-01-26 NOTE — Telephone Encounter (Signed)
Requesting: tramadol 50mg   Contract: 08/07/22 UDS: 07/25/22 Last Visit: 12/16/22 Next Visit:  03/18/23 Last Refill: 11/23/22 #60 and 0RF    Please Advise

## 2023-01-26 NOTE — Telephone Encounter (Signed)
PDMP okay, Rx sent 

## 2023-01-27 NOTE — Telephone Encounter (Signed)
Pt scheduled for 02/04/23.

## 2023-01-28 ENCOUNTER — Telehealth: Payer: Self-pay

## 2023-01-28 DIAGNOSIS — I10 Essential (primary) hypertension: Secondary | ICD-10-CM

## 2023-01-28 NOTE — Telephone Encounter (Signed)
-----   Message from Georgeanna Lea, MD sent at 01/28/2023 10:33 AM EDT ----- Chem-7 looks good, but need to be repeated so please repeat Chem-7

## 2023-01-28 NOTE — Telephone Encounter (Signed)
Patient notified of results and recommendations and agreed with plan, lab order on file.   

## 2023-02-02 DIAGNOSIS — I10 Essential (primary) hypertension: Secondary | ICD-10-CM | POA: Diagnosis not present

## 2023-02-03 LAB — BASIC METABOLIC PANEL
BUN/Creatinine Ratio: 13 (ref 12–28)
BUN: 12 mg/dL (ref 8–27)
CO2: 21 mmol/L (ref 20–29)
Calcium: 9.1 mg/dL (ref 8.7–10.3)
Chloride: 104 mmol/L (ref 96–106)
Creatinine, Ser: 0.95 mg/dL (ref 0.57–1.00)
Glucose: 84 mg/dL (ref 70–99)
Potassium: 4.2 mmol/L (ref 3.5–5.2)
Sodium: 140 mmol/L (ref 134–144)
eGFR: 58 mL/min/{1.73_m2} — ABNORMAL LOW (ref >59)

## 2023-02-04 ENCOUNTER — Ambulatory Visit (INDEPENDENT_AMBULATORY_CARE_PROVIDER_SITE_OTHER): Payer: Medicare PPO

## 2023-02-04 ENCOUNTER — Telehealth: Payer: Self-pay

## 2023-02-04 DIAGNOSIS — M810 Age-related osteoporosis without current pathological fracture: Secondary | ICD-10-CM

## 2023-02-04 MED ORDER — DENOSUMAB 60 MG/ML ~~LOC~~ SOSY
60.0000 mg | PREFILLED_SYRINGE | Freq: Once | SUBCUTANEOUS | Status: AC
  Administered 2023-02-04: 60 mg via SUBCUTANEOUS

## 2023-02-04 NOTE — Telephone Encounter (Signed)
Prolia given today. 

## 2023-02-04 NOTE — Progress Notes (Signed)
Pt is here today for a Prolia injection per Dr.Paz, Injection was given SQ on left arm. Pt tolerated well.

## 2023-02-06 ENCOUNTER — Telehealth: Payer: Self-pay

## 2023-02-06 NOTE — Telephone Encounter (Signed)
-----   Message from Georgeanna Lea, MD sent at 02/06/2023  9:41 AM EDT ----- Chem-7 looks good, continue present management

## 2023-02-06 NOTE — Telephone Encounter (Signed)
Patient notified through my chart.

## 2023-02-16 ENCOUNTER — Other Ambulatory Visit: Payer: Self-pay | Admitting: Internal Medicine

## 2023-03-18 ENCOUNTER — Ambulatory Visit (INDEPENDENT_AMBULATORY_CARE_PROVIDER_SITE_OTHER): Payer: Medicare PPO | Admitting: Internal Medicine

## 2023-03-18 ENCOUNTER — Encounter: Payer: Self-pay | Admitting: Internal Medicine

## 2023-03-18 VITALS — BP 120/78 | HR 71 | Temp 98.4°F | Resp 18 | Ht 62.0 in | Wt 170.6 lb

## 2023-03-18 DIAGNOSIS — E7849 Other hyperlipidemia: Secondary | ICD-10-CM

## 2023-03-18 DIAGNOSIS — G629 Polyneuropathy, unspecified: Secondary | ICD-10-CM | POA: Diagnosis not present

## 2023-03-18 DIAGNOSIS — E039 Hypothyroidism, unspecified: Secondary | ICD-10-CM | POA: Diagnosis not present

## 2023-03-18 DIAGNOSIS — M810 Age-related osteoporosis without current pathological fracture: Secondary | ICD-10-CM | POA: Diagnosis not present

## 2023-03-18 DIAGNOSIS — Z0001 Encounter for general adult medical examination with abnormal findings: Secondary | ICD-10-CM | POA: Diagnosis not present

## 2023-03-18 DIAGNOSIS — I10 Essential (primary) hypertension: Secondary | ICD-10-CM

## 2023-03-18 DIAGNOSIS — E1142 Type 2 diabetes mellitus with diabetic polyneuropathy: Secondary | ICD-10-CM | POA: Diagnosis not present

## 2023-03-18 NOTE — Assessment & Plan Note (Signed)
Here for CPX -Tdap: 2011   - PNA 23: 2011, 2022 - PNM 13: 2015  - s/p zostavax 02/26/11;  s/p shingrix   -Vaccines I recommend: RSV. Pneumonia shot (PNM 20).  COVID booster if not done recently.  Flu shot.  --No further paps , MMGs, CCS: See previous entries.  -Patient education: Healthcare power of attorney - Labs: See orders

## 2023-03-18 NOTE — Progress Notes (Signed)
Subjective:    Patient ID: Kathleen Thompson, female    DOB: 26-Jan-1934, 89 y.o.   MRN: [id number]  DOS:  03/18/2023 Type of visit - description: cpx  Here for CPX Chronic medical problems reviewed. In general states she feels great except for neuropathy. Cardiology note reviewed.  Review of Systems  Other than above, a 14 point review of systems is negative     Past Medical History:  Diagnosis Date   Adenomatous colon polyp 02/2003   Arthritis    Blood transfusion    Cataract    Diverticulosis    Esophageal stricture    Gastroparesis    GERD (gastroesophageal reflux disease)    Headache    Hyperlipidemia    Hypertension    Hypothyroidism    Internal hemorrhoid    Iron deficiency anemia    Osteoporosis    PONV (postoperative nausea and vomiting)    PVD (peripheral vascular disease) (HCC)    Stroke (HCC)    TIA   TIA (transient ischemic attack)     Past Surgical History:  Procedure Laterality Date   ABDOMINAL HYSTERECTOMY     APPENDECTOMY     CERVICAL FUSION     EYE SURGERY Bilateral 08/2016   cataracts   LEFT HEART CATHETERIZATION WITH CORONARY ANGIOGRAM N/A 12/20/2012   Procedure: LEFT HEART CATHETERIZATION WITH CORONARY ANGIOGRAM;  Surgeon: Peter M Swaziland, MD;  Location: [facility] CATH LAB;  Service: Cardiovascular;  Laterality: N/A;   shoulder surgery      VIDEO ASSISTED THORACOSCOPY (VATS)/WEDGE RESECTION Right 03/14/2013   Procedure: VIDEO ASSISTED THORACOSCOPY (VATS)/WEDGE RESECTION;  Surgeon: Loreli Slot, MD;  Location: [facility] OR;  Service: Thoracic;  Laterality: Right;   Social History   Social History Narrative   Household-- pt and wife   2 boys , one in [place], on in [place]    No surviving sibling out of seven.   One cup coffee with 1/2 caffeine.   Right-handed.     Current Outpatient Medications  Medication Instructions   aspirin 81 mg, Daily   CALCIUM PO 1 tablet, 2 times daily   clopidogrel (PLAVIX) 75 mg, Oral, Daily   Coenzyme Q10 (CO Q-10 PO) 1  tablet, Daily   ezetimibe (ZETIA) 10 mg, Oral, Daily   furosemide (LASIX) 40 mg, Oral, Daily   isosorbide mononitrate (IMDUR) 30 MG 24 hr tablet Take 1 tablet by mouth once daily   levothyroxine (SYNTHROID) 50 mcg, Oral, Daily before breakfast   losartan (COZAAR) 50 mg, Oral, Daily   magnesium 30 mg, Oral, Daily,     pantoprazole (PROTONIX) 40 MG tablet TAKE 1 TABLET BY MOUTH ONCE DAILY BEFORE BREAKFAST   potassium chloride (KLOR-CON) 10 MEQ tablet 10 mEq, Oral, Daily   pregabalin (LYRICA) 25 mg, Oral, 2 times daily   rosuvastatin (CRESTOR) 40 mg, Oral, Daily at bedtime   traMADol (ULTRAM) 50 mg, Oral, Every 12 hours PRN   vitamin D (CHOLECALCIFEROL) 400 Units, Oral, Daily,         Objective:   Physical Exam BP 120/78 (BP Location: Left Arm, Patient Position: Sitting, Cuff Size: Normal)   Pulse 71   Temp 98.4 F (36.9 C) (Oral)   Resp 18   Ht 5\' 2"  (1.575 m)   Wt 170 lb 9.6 oz (77.4 kg)   SpO2 97%   BMI 31.20 kg/m  General: Well developed, NAD, BMI noted Neck: No  thyromegaly  HEENT:  Normocephalic . Face symmetric, atraumatic Lungs:  CTA B Normal respiratory  effort, no intercostal retractions, no accessory muscle use. Heart: RRR,  no murmur.  Abdomen:  Not distended, soft, non-tender. No rebound or rigidity.   Lower extremities: +1-2/3  pretibial edema bilaterally.  Skin is not warm Skin: Exposed areas without rash. Not pale. Not jaundice Neurologic:  alert & oriented X3.  Speech normal, gait appropriate for age and unassisted Strength symmetric and appropriate for age.  Psych: Cognition and judgment appear intact.  Cooperative with normal attention span and concentration.  Behavior appropriate. No anxious or depressed appearing.     Assessment   Assessment DM: DX 05-2019, A1c 6.6. Neuropathy:  Previous labs WNL, intolerant to gabapentin, Cymbalta.  Lyrica helps  HTN Hypothyroidism Hyperlipidemia CV: --CAD cath 12/2012 single-vessel CAD,rx CV RF control, Rx  Plavix, imdur --TIA remotely , apparently amaurosis fugax, was a started on on plavix per PCP, no further sx since. 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 Pneumonia shot (PNM 20).  COVID booster if not done recently.  Flu shot.  --No further paps , MMGs, CCS: See previous entries.  -Patient education: Healthcare power of attorney - Labs: See orders DM: Diet controlled, check A1c. HTN: Recently cardiology increased Lasix dose, mostly to help edema.  Also on losartan, check a BMP. Hypothyroidism: On Synthroid, check TSH High cholesterol: On Crestor, Zetia.  Check labs. Neuropathy: This remains her main challenge, however Lyrica has helped to some extent.  She also found that using a fan to cool her extremities works.   Lower extremity edema: Not as well-controlled as I would like.  Recently Lasix dose increased, I also recommend to continue leg elevation, as high as possible.  She will not use compression stockings, suggested wrapping. Osteoporosis: 10/10/2020---T-score -2.9, hip On Prolia, calcium, vitamin D. RTC 4 months

## 2023-03-18 NOTE — Patient Instructions (Addendum)
Vaccines I recommend: RSV. Pneumonia shot (PNM 20).  COVID booster if not done recently.  Flu shot.   Keep your legs elevated at least one hour in the morning and 1 hour in the afternoon  Consider wrap your legs at the beginning of your day.  Check the  blood pressure regularly BP GOAL is between 110/65 and  135/85. If it is consistently higher or lower, let me know    GO TO THE LAB : Get the blood work     GO TO THE FRONT DESK, PLEASE SCHEDULE YOUR APPOINTMENTS Come back for   a checkup in 4 months      "Health Care Power of attorney" ,  "Living will" (Advance care planning documents)  If you already have a living will or healthcare power of attorney, is recommended you bring the copy to be scanned in your chart.   The document will be available to all the doctors you see in the system.  Advance care planning is a process that supports adults in  understanding and sharing their preferences regarding future medical care.  The patient's preferences are recorded in documents called Advance Directives and the can be modified at any time while the patient is in full mental capacity.   If you don't have one, please consider create one.      More information at: [email or web address]

## 2023-03-18 NOTE — Assessment & Plan Note (Signed)
Here for CPX DM: Diet controlled, check A1c. HTN: Recently cardiology increased Lasix dose, mostly to help edema.  Also on losartan, check a BMP. Hypothyroidism: On Synthroid, check TSH High cholesterol: On Crestor, Zetia.  Check labs. Neuropathy: This remains her main challenge, however Lyrica has helped to some extent.  She also found that using a fan to cool her extremities works.   Lower extremity edema: Not as well-controlled as I would like.  Recently Lasix dose increased, I also recommend to continue leg elevation, as high as possible.  She will not use compression stockings, suggested wrapping. Osteoporosis: 10/10/2020---T-score -2.9, hip On Prolia, calcium, vitamin D. RTC 4 months

## 2023-03-30 ENCOUNTER — Other Ambulatory Visit: Payer: Self-pay | Admitting: Internal Medicine

## 2023-04-11 ENCOUNTER — Other Ambulatory Visit: Payer: Self-pay | Admitting: Internal Medicine

## 2023-04-18 ENCOUNTER — Other Ambulatory Visit: Payer: Self-pay | Admitting: Internal Medicine

## 2023-04-21 ENCOUNTER — Telehealth: Payer: Self-pay | Admitting: Internal Medicine

## 2023-04-22 NOTE — Telephone Encounter (Signed)
Pdmp ok, rx sent  ? ?

## 2023-04-22 NOTE — Telephone Encounter (Signed)
Requesting: Lyrica 25mg  Contract: 08/07/22 UDS: 07/25/22 Last Visit: 03/18/23 Next Visit:  07/20/23 Last Refill: 12/16/22 #60 and 3RF   Please Advise

## 2023-04-23 ENCOUNTER — Other Ambulatory Visit: Payer: Self-pay | Admitting: Internal Medicine

## 2023-05-04 ENCOUNTER — Other Ambulatory Visit: Payer: Self-pay | Admitting: Internal Medicine

## 2023-05-05 ENCOUNTER — Other Ambulatory Visit: Payer: Self-pay | Admitting: Family Medicine

## 2023-05-05 ENCOUNTER — Other Ambulatory Visit: Payer: Self-pay | Admitting: Internal Medicine

## 2023-05-05 MED ORDER — TRAMADOL HCL 50 MG PO TABS: 50.0000 mg | ORAL_TABLET | Freq: Two times a day (BID) | ORAL | 0 refills | Status: DC | PRN

## 2023-05-05 NOTE — Telephone Encounter (Signed)
Requesting: tramadol 50mg   Contract: 07/25/22 UDS: 07/25/22 Last Visit: 03/18/23 Next Visit: 07/20/23 Last Refill: 01/26/23 #60 and 0RF   Please Advise

## 2023-05-19 ENCOUNTER — Ambulatory Visit (INDEPENDENT_AMBULATORY_CARE_PROVIDER_SITE_OTHER): Payer: Medicare PPO

## 2023-05-19 DIAGNOSIS — Z23 Encounter for immunization: Secondary | ICD-10-CM

## 2023-06-02 ENCOUNTER — Other Ambulatory Visit: Payer: Self-pay | Admitting: Internal Medicine

## 2023-06-11 ENCOUNTER — Telehealth: Payer: Self-pay | Admitting: Cardiology

## 2023-06-11 ENCOUNTER — Other Ambulatory Visit: Payer: Self-pay

## 2023-06-11 MED ORDER — FUROSEMIDE 40 MG PO TABS: 40.0000 mg | ORAL_TABLET | Freq: Every day | ORAL | 3 refills | Status: DC

## 2023-06-11 NOTE — Telephone Encounter (Signed)
Ref Lasix 40mg  1 tablet daily #90 ref x 3

## 2023-06-11 NOTE — Telephone Encounter (Signed)
*  STAT* If patient is at the pharmacy, call can be transferred to refill team.   1. Which medications need to be refilled? (please list name of each medication and dose if known)   furosemide (LASIX) 40 MG tablet   2. Would you like to learn more about the convenience, safety, & potential cost savings by using the [facility]?    3. Are you open to using the [facility] (Type [facility].    4. Which pharmacy/location (including street and city if local pharmacy) is medication to be sent to?  [facility] - [place], [place] - [address]   5. Do they need a 30 day or 90 day supply?   90 day  Patient stated she has 2 days left of this medication.

## 2023-06-12 ENCOUNTER — Other Ambulatory Visit: Payer: Self-pay

## 2023-06-12 ENCOUNTER — Other Ambulatory Visit: Payer: Self-pay | Admitting: Family

## 2023-06-12 DIAGNOSIS — M810 Age-related osteoporosis without current pathological fracture: Secondary | ICD-10-CM

## 2023-06-12 MED ORDER — DENOSUMAB 60 MG/ML ~~LOC~~ SOSY
60.0000 mg | PREFILLED_SYRINGE | Freq: Once | SUBCUTANEOUS | Status: AC
Start: 2023-06-26 — End: 2023-08-10
  Administered 2023-08-10: 60 mg via SUBCUTANEOUS

## 2023-06-15 ENCOUNTER — Telehealth: Payer: Self-pay

## 2023-06-15 NOTE — Telephone Encounter (Signed)
Prolia VOB initiated via [email or web address]  Next Prolia inj DUE: 08/04/23

## 2023-06-18 ENCOUNTER — Telehealth: Payer: Self-pay

## 2023-06-18 ENCOUNTER — Ambulatory Visit: Payer: Medicare PPO

## 2023-06-18 ENCOUNTER — Other Ambulatory Visit (HOSPITAL_COMMUNITY): Payer: Self-pay

## 2023-06-18 VITALS — Ht 62.0 in | Wt 170.0 lb

## 2023-06-18 DIAGNOSIS — Z0000 Encounter for general adult medical examination without abnormal findings: Secondary | ICD-10-CM

## 2023-06-18 NOTE — Progress Notes (Signed)
Subjective:   Kathleen Thompson is a 89 y.o. female who presents for Medicare Annual (Subsequent) preventive examination.  Visit Complete: Virtual I connected with  Kathleen Thompson on 06/18/23 by a audio enabled telemedicine application and verified that I am speaking with the correct person using two identifiers.  Patient Location: Home  Provider Location: Home Office  I discussed the limitations of evaluation and management by telemedicine. The patient expressed understanding and agreed to proceed.  Vital Signs: Because this visit was a virtual/telehealth visit, some criteria may be missing or patient reported. Any vitals not documented were not able to be obtained and vitals that have been documented are patient reported.   Cardiac Risk Factors include: advanced age (>43men, >8 women);hypertension     Objective:    Today's Vitals   06/18/23 1516  Weight: 170 lb (77.1 kg)  Height: 5\' 2"  (1.575 m)   Body mass index is 31.09 kg/m.     06/18/2023    3:23 PM 06/11/2022    1:25 PM 03/29/2022    4:57 PM 05/15/2021    3:07 PM 02/27/2020    6:51 AM 02/10/2020    3:17 PM 06/12/2018    9:11 PM  Advanced Directives  Does Patient Have a Medical Advance Directive? No No No No No No No  Would patient like information on creating a medical advance directive? No - Patient declined No - Patient declined  Yes (MAU/Ambulatory/Procedural Areas - Information given) Yes (ED - Information included in AVS)      Current Medications (verified) Outpatient Encounter Medications as of 06/18/2023  Medication Sig   aspirin 81 MG tablet Take 81 mg by mouth daily.   CALCIUM PO Take 1 tablet by mouth 2 (two) times daily.   clopidogrel (PLAVIX) 75 MG tablet Take 1 tablet (75 mg total) by mouth daily.   Coenzyme Q10 (CO Q-10 PO) Take 1 tablet by mouth daily.   ezetimibe (ZETIA) 10 MG tablet Take 1 tablet (10 mg total) by mouth daily.   furosemide (LASIX) 40 MG tablet Take 1 tablet (40 mg total) by mouth  daily.   isosorbide mononitrate (IMDUR) 30 MG 24 hr tablet Take 1 tablet (30 mg total) by mouth daily.   levothyroxine (SYNTHROID) 50 MCG tablet Take 1 tablet (50 mcg total) by mouth daily before breakfast.   losartan (COZAAR) 50 MG tablet Take 1 tablet (50 mg total) by mouth daily.   magnesium 30 MG tablet Take 30 mg by mouth daily.   pantoprazole (PROTONIX) 40 MG tablet TAKE 1 TABLET BY MOUTH ONCE DAILY BEFORE BREAKFAST (Patient taking differently: Take 40 mg by mouth daily.)   potassium chloride (KLOR-CON) 10 MEQ tablet Take 1 tablet (10 mEq total) by mouth daily.   pregabalin (LYRICA) 25 MG capsule Take 1 capsule by mouth twice daily   rosuvastatin (CRESTOR) 40 MG tablet Take 1 tablet (40 mg total) by mouth at bedtime.   traMADol (ULTRAM) 50 MG tablet Take 1 tablet (50 mg total) by mouth 2 (two) times daily as needed.   vitamin D, CHOLECALCIFEROL, 400 UNITS tablet Take 400 Units by mouth daily.   Facility-Administered Encounter Medications as of 06/18/2023  Medication   [START ON 06/26/2023] denosumab (PROLIA) injection 60 mg    Allergies (verified) Clindamycin/lincomycin, Cymbalta [duloxetine hcl], Gabapentin, Hydrocodone, Rofecoxib, and Latex   History: Past Medical History:  Diagnosis Date   Adenomatous colon polyp 02/2003   Arthritis    Blood transfusion    Cataract  Diverticulosis    Esophageal stricture    Gastroparesis    GERD (gastroesophageal reflux disease)    Headache    Hyperlipidemia    Hypertension    Hypothyroidism    Internal hemorrhoid    Iron deficiency anemia    Osteoporosis    PONV (postoperative nausea and vomiting)    PVD (peripheral vascular disease) (HCC)    Stroke (HCC)    TIA   TIA (transient ischemic attack)    Past Surgical History:  Procedure Laterality Date   ABDOMINAL HYSTERECTOMY     APPENDECTOMY     CERVICAL FUSION     EYE SURGERY Bilateral 08/2016   cataracts   LEFT HEART CATHETERIZATION WITH CORONARY ANGIOGRAM N/A 12/20/2012    Procedure: LEFT HEART CATHETERIZATION WITH CORONARY ANGIOGRAM;  Surgeon: Peter M Swaziland, MD;  Location: [facility] CATH LAB;  Service: Cardiovascular;  Laterality: N/A;   shoulder surgery      VIDEO ASSISTED THORACOSCOPY (VATS)/WEDGE RESECTION Right 03/14/2013   Procedure: VIDEO ASSISTED THORACOSCOPY (VATS)/WEDGE RESECTION;  Surgeon: Loreli Slot, MD;  [facility];  Service: Thoracic;  Laterality: Right;   Family History  Problem Relation Age of Onset   Diabetes Mother    Heart attack Mother    Heart attack Father    Asthma Father    Colon cancer Brother        at age 45   Colon cancer Sister        at age 40   Diabetes Maternal Aunt    Diabetes Maternal Uncle    Heart attack Sister    Heart attack Brother    Heart attack Brother    Heart attack Brother    Heart attack Brother    Heart attack Sister    Breast cancer Neg Hx    Social History   Socioeconomic History   Marital status: Married    Spouse name: Not on file   Number of children: 2   Years of education: 11th grade   Highest education level: Not on file  Occupational History   Occupation: Retired-[employer]     Employer: RETIRED  Tobacco Use   Smoking status: Never   Smokeless tobacco: Never  Substance and Sexual Activity   Alcohol use: No   Drug use: No   Sexual activity: Yes  Other Topics Concern   Not on file  Social History Narrative   Household-- pt and wife   2 boys , one in [place], on in [place]    No surviving sibling out of seven.   One cup coffee with 1/2 caffeine.   Right-handed.   Social Determinants of Health   Financial Resource Strain: Low Risk  (06/18/2023)   Overall Financial Resource Strain (CARDIA)    Difficulty of Paying Living Expenses: Not hard at all  Food Insecurity: No Food Insecurity (06/18/2023)   Hunger Vital Sign    Worried About Running Out of Food in the Last Year: Never true    Ran Out of Food in the Last Year: Never true  Transportation Needs: No Transportation Needs (06/18/2023)    PRAPARE - Administrator, Civil Service (Medical): No    Lack of Transportation (Non-Medical): No  Physical Activity: Inactive (06/18/2023)   Exercise Vital Sign    Days of Exercise per Week: 0 days    Minutes of Exercise per Session: 0 min  Stress: No Stress Concern Present (06/18/2023)   [employer] of Occupational Health - Occupational Stress Questionnaire  Feeling of Stress : Not at all  Social Connections: Socially Integrated (06/18/2023)   Social Connection and Isolation Panel [NHANES]    Frequency of Communication with Friends and Family: More than three times a week    Frequency of Social Gatherings with Friends and Family: More than three times a week    Attends Religious Services: More than 4 times per year    Active Member of [employer] or Organizations: Yes    Attends Engineer, structural: More than 4 times per year    Marital Status: Married    Tobacco Counseling Counseling given: Not Answered   Clinical Intake:  Pre-visit preparation completed: Yes  Pain : No/denies pain     BMI - recorded: 31.09 Nutritional Status: BMI > 30  Obese Nutritional Risks: None Diabetes: No  How often do you need to have someone help you when you read instructions, pamphlets, or other written materials from your doctor or pharmacy?: 1 - Never  Interpreter Needed?: No  Information entered by :: Theresa Mulligan LPN   Activities of Daily Living    06/18/2023    3:21 PM  In your present state of health, do you have any difficulty performing the following activities:  Hearing? 0  Vision? 0  Difficulty concentrating or making decisions? 0  Walking or climbing stairs? 0  Dressing or bathing? 0  Doing errands, shopping? 0  Preparing Food and eating ? N  Using the Toilet? N  In the past six months, have you accidently leaked urine? N  Do you have problems with loss of bowel control? N  Managing your Medications? N  Managing your Finances? N   Housekeeping or managing your Housekeeping? N    Patient Care Team: Wanda Plump, MD as PCP - General (Internal Medicine) Terrill Mohr, NP as Nurse Practitioner (Nurse Practitioner) Eldred Manges, MD as Consulting Physician (Orthopedic Surgery) Zenovia Jordan, MD as Consulting Physician (Rheumatology)  Indicate any recent Medical Services you may have received from other than [facility] providers in the past year (date may be approximate).     Assessment:   This is a routine wellness examination for Kathleen Thompson.  Hearing/Vision screen Hearing Screening - Comments:: Denies hearing difficulties   Vision Screening - Comments:: Wears rx glasses - up to date with routine eye exams with  [facility]   Goals Addressed               This Visit's Progress     Patient Stated (pt-stated)        Maintain current health!       Depression Screen    06/18/2023    3:21 PM 03/18/2023    1:05 PM 12/16/2022    1:56 PM 07/25/2022    1:59 PM 06/11/2022    1:22 PM 04/24/2022   10:48 AM 02/13/2022   11:51 AM  PHQ 2/9 Scores  PHQ - 2 Score 0 0 0 0 0 0 0    Fall Risk    06/18/2023    3:22 PM 03/18/2023    1:05 PM 12/16/2022    1:56 PM 07/25/2022    1:59 PM 06/11/2022    1:26 PM  Fall Risk   Falls in the past year? 0 0 0 0 0  Number falls in past yr: 0 0 0 0 0  Injury with Fall? 0 0 0 0 0  Risk for fall due to : No Fall Risks No Fall Risks   Impaired vision  Follow up  Falls prevention discussed Falls evaluation completed Falls evaluation completed Falls evaluation completed Falls prevention discussed    MEDICARE RISK AT HOME: Medicare Risk at Home Any stairs in or around the home?: No If so, are there any without handrails?: No Home free of loose throw rugs in walkways, pet beds, electrical cords, etc?: Yes Adequate lighting in your home to reduce risk of falls?: Yes Life alert?: No Use of a cane, walker or w/c?: No Grab bars in the bathroom?: Yes Shower chair or bench in shower?:  Yes Elevated toilet seat or a handicapped toilet?: No  TIMED UP AND GO:  Was the test performed?  No    Cognitive Function:        06/18/2023    3:23 PM  6CIT Screen  What time? --  Count back from 20 --  Months in reverse --  Repeat phrase --    Immunizations Immunization History  Administered Date(s) Administered   Fluad Quad(high Dose 65+) 04/12/2019, 05/10/2020, 04/25/2021, 04/24/2022   Fluad Trivalent(High Dose 65+) 05/19/2023   Influenza Split 05/11/2012, 05/08/2015, 04/15/2018   Influenza Whole 05/11/2009   Influenza, High Dose Seasonal PF 05/08/2014   Influenza-Unspecified 05/11/2013, 04/26/2016, 05/07/2017   PFIZER(Purple Top)SARS-COV-2 Vaccination 09/15/2019, 10/11/2019, 05/29/2020   Pneumococcal Conjugate-13 10/21/2013   Pneumococcal Polysaccharide-23 02/18/2010, 09/04/2020   Td 02/18/2010   Zoster Recombinant(Shingrix) 03/13/2018, 05/13/2018   Zoster, Live 02/26/2011    TDAP status: Due, Education has been provided regarding the importance of this vaccine. Advised may receive this vaccine at local pharmacy or Health Dept. Aware to provide a copy of the vaccination record if obtained from local pharmacy or Health Dept. Verbalized acceptance and understanding.  Flu Vaccine status: Up to date  Pneumococcal vaccine status: Up to date    Qualifies for Shingles Vaccine? Yes   Zostavax completed Yes   Shingrix Completed?: Yes  Screening Tests Health Maintenance  Topic Date Due   DTaP/Tdap/Td (2 - Tdap) 02/19/2020   HEMOGLOBIN A1C  09/18/2023   FOOT EXAM  12/16/2023   OPHTHALMOLOGY EXAM  01/23/2024   Medicare Annual Wellness (AWV)  06/17/2024   Pneumonia Vaccine 33+ Years old  Completed   INFLUENZA VACCINE  Completed   DEXA SCAN  Completed   Zoster Vaccines- Shingrix  Completed   HPV VACCINES  Aged Out   COVID-19 Vaccine  Discontinued    Health Maintenance  Health Maintenance Due  Topic Date Due   DTaP/Tdap/Td (2 - Tdap) 02/19/2020         Bone Density status: Completed 10/11/20. Results reflect: Bone density results: OSTEOPOROSIS. Repeat every   years.    Additional Screening:    Vision Screening: Recommended annual ophthalmology exams for early detection of glaucoma and other disorders of the eye. Is the patient up to date with their annual eye exam?  Yes  Who is the provider or what is the name of the office in which the patient attends annual eye exams? [facility] If pt is not established with a provider, would they like to be referred to a provider to establish care? No .   Dental Screening: Recommended annual dental exams for proper oral hygiene   Community Resource Referral / Chronic Care Management:  CRR required this visit?  No   CCM required this visit?  No     Plan:     I have personally reviewed and noted the following in the patient's chart:   Medical and social history Use of alcohol, tobacco or illicit drugs  Current medications and supplements including opioid prescriptions. Patient is not currently taking opioid prescriptions. Functional ability and status Nutritional status Physical activity Advanced directives List of other physicians Hospitalizations, surgeries, and ER visits in previous 12 months Vitals Screenings to include cognitive, depression, and falls Referrals and appointments  In addition, I have reviewed and discussed with patient certain preventive protocols, quality metrics, and best practice recommendations. A written personalized care plan for preventive services as well as general preventive health recommendations were provided to patient.     Tillie Rung, LPN   [id number]   After Visit Summary: (MyChart) Due to this being a telephonic visit, the after visit summary with patients personalized plan was offered to patient via MyChart   Nurse Notes: None

## 2023-06-18 NOTE — Patient Instructions (Addendum)
Kathleen Thompson , Thank you for taking time to come for your Medicare Wellness Visit. I appreciate your ongoing commitment to your health goals. Please review the following plan we discussed and let me know if I can assist you in the future.   Referrals/Orders/Follow-Ups/Clinician Recommendations:   This is a list of the screening recommended for you and due dates:  Health Maintenance  Topic Date Due   DTaP/Tdap/Td vaccine (2 - Tdap) 02/19/2020   Hemoglobin A1C  09/18/2023   Complete foot exam   12/16/2023   Eye exam for diabetics  01/23/2024   Medicare Annual Wellness Visit  06/17/2024   Pneumonia Vaccine  Completed   Flu Shot  Completed   DEXA scan (bone density measurement)  Completed   Zoster (Shingles) Vaccine  Completed   HPV Vaccine  Aged Out   COVID-19 Vaccine  Discontinued    Advanced directives: (Declined) Advance directive discussed with you today. Even though you declined this today, please call our office should you change your mind, and we can give you the proper paperwork for you to fill out.  Next Medicare Annual Wellness Visit scheduled for next year: Yes

## 2023-06-18 NOTE — Telephone Encounter (Signed)
Requesting: tramadol 50mg   Contract: 07/25/22 UDS: 07/25/22 Last Visit: 03/18/23 Next Visit: 07/20/23 Last Refill: 05/05/23 #60 and 0RF   Please Advise

## 2023-06-18 NOTE — Telephone Encounter (Signed)
Pharmacy Patient Advocate Encounter  Received notification from [facility] that Prior Authorization for Prolia has been APPROVED from 08/12/23 to 08/10/24   PA #/Case ID/Reference #: [id number]  Approval letter indexed to media tab

## 2023-06-18 NOTE — Telephone Encounter (Signed)
Pt ready for scheduling for Prolia on or after : 08/06/23  Out-of-pocket cost due at time of visit: $40  Primary: [employer] - Medicare Prolia co-insurance: $40 Admin fee co-insurance: 0  Secondary: n/a Prolia co-insurance:  Admin fee co-insurance:   Medical Benefit Details: Date Benefits were checked: 06/15/23 Deductible: no/ Coinsurance: $40/ Admin Fee: 0  Prior Auth: approved PA# [id number] Expiration Date: 06/21/2021 to 08/11/2023  # of doses approved:  Pharmacy benefit: Copay $64 If patient wants fill through the pharmacy benefit please send prescription to: [facility], and include estimated need by date in rx notes. Pharmacy will ship medication directly to the office.  Patient not eligible for Prolia Copay Card. Copay Card can make patient's cost as little as $25. Link to apply: [email or web address]://www.amgensupportplus.com/copay  ** This summary of benefits is an estimation of the patient's out-of-pocket cost. Exact cost may very based on individual plan coverage.

## 2023-06-19 MED ORDER — TRAMADOL HCL 50 MG PO TABS: 50.0000 mg | ORAL_TABLET | Freq: Two times a day (BID) | ORAL | 0 refills | Status: DC | PRN

## 2023-06-19 NOTE — Telephone Encounter (Signed)
PDMP okay, Rx sent 

## 2023-06-22 NOTE — Telephone Encounter (Signed)
Pt scheduled for 08/10/2023

## 2023-07-07 ENCOUNTER — Other Ambulatory Visit: Payer: Self-pay | Admitting: Internal Medicine

## 2023-07-20 ENCOUNTER — Encounter: Payer: Self-pay | Admitting: Internal Medicine

## 2023-07-20 ENCOUNTER — Ambulatory Visit: Payer: Medicare PPO | Admitting: Internal Medicine

## 2023-07-20 VITALS — BP 126/74 | HR 74 | Temp 97.9°F | Resp 16 | Ht 62.0 in | Wt 176.4 lb

## 2023-07-20 DIAGNOSIS — E039 Hypothyroidism, unspecified: Secondary | ICD-10-CM

## 2023-07-20 DIAGNOSIS — I10 Essential (primary) hypertension: Secondary | ICD-10-CM

## 2023-07-20 DIAGNOSIS — E1142 Type 2 diabetes mellitus with diabetic polyneuropathy: Secondary | ICD-10-CM | POA: Diagnosis not present

## 2023-07-20 DIAGNOSIS — Z79899 Other long term (current) drug therapy: Secondary | ICD-10-CM | POA: Diagnosis not present

## 2023-07-20 DIAGNOSIS — G629 Polyneuropathy, unspecified: Secondary | ICD-10-CM

## 2023-07-20 MED ORDER — PREGABALIN 25 MG PO CAPS: 25.0000 mg | ORAL_CAPSULE | Freq: Two times a day (BID) | ORAL | 6 refills | Status: DC

## 2023-07-20 NOTE — Patient Instructions (Addendum)
Vaccines I recommend: Tdap (tetanus)  Check the  blood pressure regularly Blood pressure goal:  between 110/65 and  135/85. If it is consistently higher or lower, let me know     GO TO THE LAB : Get the blood work     Next visit with me in 4 to 5 months    Please schedule it at the front desk  When you see cardiology, ask them about the option to add a medication called "PCSK9" to help with cholesterol control

## 2023-07-20 NOTE — Progress Notes (Signed)
Subjective:    Patient ID: Kathleen Thompson, female    DOB: 1934-02-29, 89 y.o.   MRN: [id number]  DOS:  07/20/2023 Type of visit - description: Follow-up  No new concerns. "I feel good, is just the neuropathy that bothers me".  Denies chest pain or difficulty breathing. No nausea or vomiting.  No blood in the stools  Review of Systems See above   Past Medical History:  Diagnosis Date   Adenomatous colon polyp 02/2003   Arthritis    Blood transfusion    Cataract    Diverticulosis    Esophageal stricture    Gastroparesis    GERD (gastroesophageal reflux disease)    Headache    Hyperlipidemia    Hypertension    Hypothyroidism    Internal hemorrhoid    Iron deficiency anemia    Osteoporosis    PONV (postoperative nausea and vomiting)    PVD (peripheral vascular disease) (HCC)    Stroke (HCC)    TIA   TIA (transient ischemic attack)     Past Surgical History:  Procedure Laterality Date   ABDOMINAL HYSTERECTOMY     APPENDECTOMY     CERVICAL FUSION     EYE SURGERY Bilateral 08/2016   cataracts   LEFT HEART CATHETERIZATION WITH CORONARY ANGIOGRAM N/A 12/20/2012   Procedure: LEFT HEART CATHETERIZATION WITH CORONARY ANGIOGRAM;  Surgeon: Peter M Swaziland, MD;  Location: [facility] CATH LAB;  Service: Cardiovascular;  Laterality: N/A;   shoulder surgery      VIDEO ASSISTED THORACOSCOPY (VATS)/WEDGE RESECTION Right 03/14/2013   Procedure: VIDEO ASSISTED THORACOSCOPY (VATS)/WEDGE RESECTION;  Surgeon: Loreli Slot, MD;  Location: [facility] OR;  Service: Thoracic;  Laterality: Right;    Current Outpatient Medications  Medication Instructions   aspirin 81 mg, Daily   CALCIUM PO 1 tablet, 2 times daily   clopidogrel (PLAVIX) 75 mg, Oral, Daily   Coenzyme Q10 (CO Q-10 PO) 1 tablet, Daily   ezetimibe (ZETIA) 10 mg, Oral, Daily   furosemide (LASIX) 40 mg, Oral, Daily   isosorbide mononitrate (IMDUR) 30 mg, Oral, Daily   levothyroxine (SYNTHROID) 50 mcg, Oral, Daily before breakfast    losartan (COZAAR) 50 mg, Oral, Daily   magnesium 30 mg, Oral, Daily,     pantoprazole (PROTONIX) 40 mg, Oral, Daily before breakfast   potassium chloride (KLOR-CON) 10 MEQ tablet 10 mEq, Oral, Daily   pregabalin (LYRICA) 25 mg, Oral, 2 times daily   rosuvastatin (CRESTOR) 40 mg, Oral, Daily at bedtime   traMADol (ULTRAM) 50 mg, Oral, 2 times daily PRN   vitamin D (CHOLECALCIFEROL) 400 Units, Oral, Daily,         Objective:   Physical Exam BP 126/74   Pulse 74   Temp 97.9 F (36.6 C) (Oral)   Resp 16   Ht 5\' 2"  (1.575 m)   Wt 176 lb 6 oz (80 kg)   SpO2 96%   BMI 32.26 kg/m  General:   Well developed, NAD, BMI noted. HEENT:  Normocephalic . Face symmetric, atraumatic Lungs:  CTA B Normal respiratory effort, no intercostal retractions, no accessory muscle use. Heart: RRR,  no murmur.  Lower extremities: Trace pretibial edema noted today, using compression stockings. Skin: Not pale. Not jaundice Neurologic:  alert & oriented X3.  Speech normal, gait appropriate for age and unassisted Psych--  Cognition and judgment appear intact.  Cooperative with normal attention span and concentration.  Behavior appropriate. No anxious or depressed appearing.      Assessment  Assessment DM: DX 05-2019, A1c 6.6. Neuropathy:  Previous labs WNL, intolerant to gabapentin, Cymbalta.  Lyrica helps  HTN Hypothyroidism Hyperlipidemia CV: --CAD cath 12/2012 single-vessel CAD,rx CV RF control, Rx Plavix, imdur --TIA remotely , apparently amaurosis fugax, was a started on on plavix per PCP, no further sx since. 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 HTN: Ambulatory BPs when checked are normal.  BP today is very good, continue Lasix, Imdur, losartan, potassium, check a BMP. Hypothyroidism: Synthroid, check TSH. Neuropathy: Unchanged from previous visits, for some reason could not fill up a prescription for pregabalin, prescription printed.  UDS checked CAD: Asymptomatic. High cholesterol: On Zetia, Crestor, last LDL 76, slightly above goal, PCSK9's an option, recommend to discuss with cardiology Does not check MyChart, call or mail results RTC 4 to 5 months

## 2023-07-20 NOTE — Assessment & Plan Note (Signed)
DM: Diet controlled, check A1c. HTN: Ambulatory BPs when checked are normal.  BP today is very good, continue Lasix, Imdur, losartan, potassium, check a BMP. Hypothyroidism: Synthroid, check TSH. Neuropathy: Unchanged from previous visits, for some reason could not fill up a prescription for pregabalin, prescription printed.  UDS checked CAD: Asymptomatic. High cholesterol: On Zetia, Crestor, last LDL 76, slightly above goal, PCSK9's an option, recommend to discuss with cardiology Does not check MyChart, call or mail results RTC 4 to 5 months

## 2023-07-21 LAB — BASIC METABOLIC PANEL
BUN: 16 mg/dL (ref 6–23)
CO2: 27 meq/L (ref 19–32)
Calcium: 8.9 mg/dL (ref 8.4–10.5)
Chloride: 102 meq/L (ref 96–112)
Creatinine, Ser: 0.91 mg/dL (ref 0.40–1.20)
GFR: 56.78 mL/min — ABNORMAL LOW (ref >60.00)
Glucose, Bld: 113 mg/dL — ABNORMAL HIGH (ref 70–99)
Potassium: 3.9 meq/L (ref 3.5–5.1)
Sodium: 138 meq/L (ref 135–145)

## 2023-07-21 LAB — TSH: TSH: 3.2 u[IU]/mL (ref 0.35–5.50)

## 2023-07-21 LAB — HEMOGLOBIN A1C: Hgb A1c MFr Bld: 7 % — ABNORMAL HIGH (ref 4.6–6.5)

## 2023-07-22 LAB — DRUG MONITORING PANEL 375977 , URINE
Alcohol Metabolites: NEGATIVE ng/mL (ref <500)
Alphahydroxyalprazolam: NEGATIVE ng/mL (ref <25)
Alphahydroxymidazolam: NEGATIVE ng/mL (ref <50)
Alphahydroxytriazolam: NEGATIVE ng/mL (ref <50)
Aminoclonazepam: NEGATIVE ng/mL (ref <25)
Amphetamines: NEGATIVE ng/mL (ref <500)
Barbiturates: NEGATIVE ng/mL (ref <300)
Benzodiazepines: NEGATIVE ng/mL (ref <100)
Cocaine Metabolite: NEGATIVE ng/mL (ref <150)
Desmethyltramadol: 4775 ng/mL — ABNORMAL HIGH (ref <100)
Hydroxyethylflurazepam: NEGATIVE ng/mL (ref <50)
Lorazepam: NEGATIVE ng/mL (ref <50)
Marijuana Metabolite: NEGATIVE ng/mL (ref <20)
Nordiazepam: NEGATIVE ng/mL (ref <50)
Opiates: NEGATIVE ng/mL (ref <100)
Oxazepam: NEGATIVE ng/mL (ref <50)
Oxycodone: NEGATIVE ng/mL (ref <100)
Temazepam: NEGATIVE ng/mL (ref <50)
Tramadol: 1219 ng/mL — ABNORMAL HIGH (ref <100)

## 2023-07-22 LAB — DM TEMPLATE

## 2023-07-24 DIAGNOSIS — H40053 Ocular hypertension, bilateral: Secondary | ICD-10-CM | POA: Diagnosis not present

## 2023-08-10 ENCOUNTER — Other Ambulatory Visit: Payer: Self-pay | Admitting: Internal Medicine

## 2023-08-10 ENCOUNTER — Ambulatory Visit (INDEPENDENT_AMBULATORY_CARE_PROVIDER_SITE_OTHER): Payer: Medicare PPO | Admitting: Neurology

## 2023-08-10 ENCOUNTER — Other Ambulatory Visit: Payer: Self-pay

## 2023-08-10 ENCOUNTER — Telehealth: Payer: Self-pay | Admitting: Neurology

## 2023-08-10 DIAGNOSIS — M810 Age-related osteoporosis without current pathological fracture: Secondary | ICD-10-CM

## 2023-08-10 MED ORDER — DENOSUMAB 60 MG/ML ~~LOC~~ SOSY
60.0000 mg | PREFILLED_SYRINGE | Freq: Once | SUBCUTANEOUS | Status: AC
  Administered 2024-02-22: 60 mg via SUBCUTANEOUS

## 2023-08-10 NOTE — Telephone Encounter (Signed)
Patient had Prolia injection today.

## 2023-08-10 NOTE — Progress Notes (Signed)
  Prolia 60 mg SQ , was administered right arm today. Patient tolerated injection.  Patient next injection due: 6 months, appt made: No- will schedule in 5 months after benefit re-submission  Patient supplied: no

## 2023-08-10 NOTE — Telephone Encounter (Signed)
Prolia was done today Next is due on/after 02/08/2024 CAM placed.

## 2023-08-28 ENCOUNTER — Ambulatory Visit: Payer: Medicare PPO | Attending: Cardiology | Admitting: Cardiology

## 2023-08-28 ENCOUNTER — Encounter: Payer: Self-pay | Admitting: Cardiology

## 2023-08-28 VITALS — BP 134/72 | HR 77 | Ht 62.0 in | Wt 177.0 lb

## 2023-08-28 DIAGNOSIS — M7989 Other specified soft tissue disorders: Secondary | ICD-10-CM

## 2023-08-28 DIAGNOSIS — R0609 Other forms of dyspnea: Secondary | ICD-10-CM

## 2023-08-28 DIAGNOSIS — I10 Essential (primary) hypertension: Secondary | ICD-10-CM | POA: Diagnosis not present

## 2023-08-28 DIAGNOSIS — I2510 Atherosclerotic heart disease of native coronary artery without angina pectoris: Secondary | ICD-10-CM | POA: Diagnosis not present

## 2023-08-28 NOTE — Patient Instructions (Signed)
Medication Instructions:  Your physician recommends that you continue on your current medications as directed. Please refer to the Current Medication list given to you today.  *If you need a refill on your cardiac medications before your next appointment, please call your pharmacy*   Lab Work: Your physician recommends that you have labs done in the office today. Your test included  complete metabolic panel, magnesium level and Direct LDL.  If you have labs (blood work) drawn today and your tests are completely normal, you will receive your results only by: MyChart Message (if you have MyChart) OR A paper copy in the mail If you have any lab test that is abnormal or we need to change your treatment, we will call you to review the results.   Testing/Procedures: None Ordered   Follow-Up: At [facility], you and your health needs are our priority.  As part of our continuing mission to provide you with exceptional heart care, we have created designated Provider Care Teams.  These Care Teams include your primary Cardiologist (physician) and Advanced Practice Providers (APPs -  Physician Assistants and Nurse Practitioners) who all work together to provide you with the care you need, when you need it.  We recommend signing up for the patient portal called "MyChart".  Sign up information is provided on this After Visit Summary.  MyChart is used to connect with patients for Virtual Visits (Telemedicine).  Patients are able to view lab/test results, encounter notes, upcoming appointments, etc.  Non-urgent messages can be sent to your provider as well.   To learn more about what you can do with MyChart, go to [email or web address].    Your next appointment:   6 month follow up

## 2023-08-28 NOTE — Progress Notes (Signed)
Cardiology Office Note:    Date:  08/28/2023   ID:  Kathleen Thompson, DOB February 18, 1934, MRN [id number]  PCP:  Wanda Plump, MD  Cardiologist:  Gypsy Balsam, MD    Referring MD: Wanda Plump, MD   Chief Complaint  Patient presents with   Weight Gain   Spasms    History of Present Illness:    Kathleen Thompson is a 89 y.o. female past medical history significant for coronary artery disease, in 2014 she had cardiac catheterization PTCA and stenting of artery however have no documentation of it additional problem include TIA many years ago she was put on Plavix since that time essential hypertension dyslipidemia swelling of lower extremities. Comes today to months for follow-up of a few issues that she wanted talk about first of all she got terrible time trying to sleep she cannot sleep at night.  She was given some medication but with limited results she did try melatonin she did tried some Benadryl but limited results.  Denies have any chest pain tightness squeezing pressure burning chest no shortness of breath but swelling of lower extremity still there and quite disturbing she also described to have pain in her leg especially evening time suggest neuropathy.  She was given gabapentin look like but she is not clear if it is helping her any.  Past Medical History:  Diagnosis Date   Adenomatous colon polyp 02/2003   Arthritis    Blood transfusion    Cataract    Diverticulosis    Esophageal stricture    Gastroparesis    GERD (gastroesophageal reflux disease)    Headache    Hyperlipidemia    Hypertension    Hypothyroidism    Internal hemorrhoid    Iron deficiency anemia    Osteoporosis    PONV (postoperative nausea and vomiting)    PVD (peripheral vascular disease) (HCC)    Stroke (HCC)    TIA   TIA (transient ischemic attack)     Past Surgical History:  Procedure Laterality Date   ABDOMINAL HYSTERECTOMY     APPENDECTOMY     CERVICAL FUSION     EYE SURGERY Bilateral 08/2016    cataracts   LEFT HEART CATHETERIZATION WITH CORONARY ANGIOGRAM N/A 12/20/2012   Procedure: LEFT HEART CATHETERIZATION WITH CORONARY ANGIOGRAM;  Surgeon: Peter M Swaziland, MD;  Location: [facility] C[facility] LAB;  Service: Cardiovascular;  Laterality: N/A;   shoulder surgery      VIDEO ASSISTED THORACOSCOPY (VATS)/WEDGE RESECTION Right 03/14/2013   Procedure: VIDEO ASSISTED THORACOSCOPY (VATS)/WEDGE RESECTION;  Surgeon: Loreli Slot, MD;  Location: [facility] OR;  Service: Thoracic;  Laterality: Right;    Current Medications: Current Meds  Medication Sig   aspirin 81 MG tablet Take 81 mg by mouth daily.   CALCIUM PO Take 1 tablet by mouth 2 (two) times daily.   clopidogrel (PLAVIX) 75 MG tablet Take 1 tablet (75 mg total) by mouth daily.   Coenzyme Q10 (CO Q-10 PO) Take 1 tablet by mouth daily.   ezetimibe (ZETIA) 10 MG tablet Take 1 tablet (10 mg total) by mouth daily.   furosemide (LASIX) 40 MG tablet Take 1 tablet (40 mg total) by mouth daily.   isosorbide mononitrate (IMDUR) 30 MG 24 hr tablet Take 1 tablet (30 mg total) by mouth daily.   levothyroxine (SYNTHROID) 50 MCG tablet Take 1 tablet (50 mcg total) by mouth daily before breakfast.   losartan (COZAAR) 50 MG tablet Take 1 tablet (50 mg total) by mouth  daily.   magnesium 30 MG tablet Take 30 mg by mouth daily.   pantoprazole (PROTONIX) 40 MG tablet Take 1 tablet (40 mg total) by mouth daily before breakfast.   potassium chloride (KLOR-CON) 10 MEQ tablet Take 1 tablet (10 mEq total) by mouth daily.   pregabalin (LYRICA) 25 MG capsule Take 1 capsule (25 mg total) by mouth 2 (two) times daily.   rosuvastatin (CRESTOR) 40 MG tablet Take 1 tablet (40 mg total) by mouth at bedtime.   traMADol (ULTRAM) 50 MG tablet Take 1 tablet (50 mg total) by mouth 2 (two) times daily as needed. (Patient taking differently: Take 50 mg by mouth 2 (two) times daily as needed for moderate pain (pain score 4-6) or severe pain (pain score 7-10).)   vitamin D,  CHOLECALCIFEROL, 400 UNITS tablet Take 400 Units by mouth daily.   Current Facility-Administered Medications for the 08/28/23 encounter (Office Visit) with Georgeanna Lea, MD  Medication   [START ON 01/08/2024] denosumab (PROLIA) injection 60 mg     Allergies:   Clindamycin/lincomycin, Cymbalta [duloxetine hcl], Gabapentin, Hydrocodone, Rofecoxib, and Latex   Social History   Socioeconomic History   Marital status: Married    Spouse name: Not on file   Number of children: 2   Years of education: 11th grade   Highest education level: Not on file  Occupational History   Occupation: Retired-[employer]     Employer: RETIRED  Tobacco Use   Smoking status: Never   Smokeless tobacco: Never  Substance and Sexual Activity   Alcohol use: No   Drug use: No   Sexual activity: Yes  Other Topics Concern   Not on file  Social History Narrative   Household-- pt and wife   2 boys , one in [place], on in [place]    No surviving sibling out of seven.   One cup coffee with 1/2 caffeine.   Right-handed.   Social Drivers of Corporate investment banker Strain: Low Risk  (06/18/2023)   Overall Financial Resource Strain (CARDIA)    Difficulty of Paying Living Expenses: Not hard at all  Food Insecurity: No Food Insecurity (06/18/2023)   Hunger Vital Sign    Worried About Running Out of Food in the Last Year: Never true    Ran Out of Food in the Last Year: Never true  Transportation Needs: No Transportation Needs (06/18/2023)   PRAPARE - Administrator, Civil Service (Medical): No    Lack of Transportation (Non-Medical): No  Physical Activity: Inactive (06/18/2023)   Exercise Vital Sign    Days of Exercise per Week: 0 days    Minutes of Exercise per Session: 0 min  Stress: No Stress Concern Present (06/18/2023)   [employer] of Occupational Health - Occupational Stress Questionnaire    Feeling of Stress : Not at all  Social Connections: Socially Integrated (06/18/2023)   Social  Connection and Isolation Panel [NHANES]    Frequency of Communication with Friends and Family: More than three times a week    Frequency of Social Gatherings with Friends and Family: More than three times a week    Attends Religious Services: More than 4 times per year    Active Member of [employer] or Organizations: Yes    Attends Engineer, structural: More than 4 times per year    Marital Status: Married     Family History: The patient's family history includes Asthma in her father; Colon cancer in her brother and sister;  Diabetes in her maternal aunt, maternal uncle, and mother; Heart attack in her brother, brother, brother, brother, father, mother, sister, and sister. There is no history of Breast cancer. ROS:   Please see the history of present illness.    All 14 point review of systems negative except as described per history of present illness  EKGs/Labs/Other Studies Reviewed:    EKG Interpretation Date/Time:  Friday August 28 2023 13:02:39 EST Ventricular Rate:  77 PR Interval:  142 QRS Duration:  82 QT Interval:  462 QTC Calculation: 522 R Axis:   27  Text Interpretation: Sinus rhythm with Premature atrial complexes in a pattern of bigeminy Low voltage QRS Nonspecific T wave abnormality When compared with ECG of 10-Feb-2020 15:13, Premature atrial complexes are now Present QT has lengthened Confirmed by Gypsy Balsam [phone]) on 08/28/2023 1:03:48 PM    Recent Labs: 03/18/2023: ALT 14; Hemoglobin 13.9; Platelets 237.0 07/20/2023: BUN 16; Creatinine, Ser 0.91; Potassium 3.9; Sodium 138; TSH 3.20  Recent Lipid Panel    Component Value Date/Time   CHOL 168 03/18/2023 1335   CHOL 203 (H) 04/21/2022 1306   TRIG 117.0 03/18/2023 1335   TRIG 132 06/10/2006 1139   HDL 69.10 03/18/2023 1335   HDL 78 04/21/2022 1306   CHOLHDL 2 03/18/2023 1335   VLDL 23.4 03/18/2023 1335   LDLCALC 76 03/18/2023 1335   LDLCALC 101 (H) 04/21/2022 1306   LDLDIRECT 79.0 12/10/2021 1445     Physical Exam:    VS:  BP 134/72 (BP Location: Right Arm, Patient Position: Sitting)   Pulse 77   Ht 5\' 2"  (1.575 m)   Wt 177 lb (80.3 kg)   SpO2 97%   BMI 32.37 kg/m     Wt Readings from Last 3 Encounters:  08/28/23 177 lb (80.3 kg)  07/20/23 176 lb 6 oz (80 kg)  06/18/23 170 lb (77.1 kg)     GEN:  Well nourished, well developed in no acute distress HEENT: Normal NECK: No JVD; No carotid bruits LYMPHATICS: No lymphadenopathy CARDIAC: RRR, no murmurs, no rubs, no gallops RESPIRATORY:  Clear to auscultation without rales, wheezing or rhonchi  ABDOMEN: Soft, non-tender, non-distended MUSCULOSKELETAL:  No edema; No deformity  SKIN: Warm and dry LOWER EXTREMITIES: no swelling NEUROLOGIC:  Alert and oriented x 3 PSYCHIATRIC:  Normal affect   ASSESSMENT:    1. Primary hypertension   2. Coronary artery disease involving native coronary artery of native heart without angina pectoris   3. Swelling of both lower extremities    PLAN:    In order of problems listed above:  Coronary disease stable from that point we will continue present management. Essential hypertension: Blood pressure well-controlled continue present management. Dyslipidemia I did review K PN which show me her LDL 76 HDL 69 she is on high intensity statin as well as Zetia.  I will recheck level direct LDL today to verify if we still have adequate control of this problem. She gained few pounds portion of it is probably swelling of lower extremity versus some pitting edema some lymphedema.  There is minimal pitting.  Will ask her to have proBNP Chem-7 today.  Based on that we decide if we can increase dose of diuretic I told her to restrict her fluids.  She tells me she drinks a lot which potentially is problem she also drink some electrolytes which can be problematic as well. Cramps we will check magnesium calcium potassium today we did talk about potentially using tonic water which she  tried but she absolutely  hated and she does not think it helps her.  This is a difficult problem to solve.  Medication for neuropathy like Lyrica or gabapentin can be beneficial for it   Medication Adjustments/Labs and Tests Ordered: Current medicines are reviewed at length with the patient today.  Concerns regarding medicines are outlined above.  Orders Placed This Encounter  Procedures   EKG 12-Lead   Medication changes: No orders of the defined types were placed in this encounter.   Signed, Georgeanna Lea, MD, [facility] 08/28/2023 1:39 PM    [facility]

## 2023-08-28 NOTE — Addendum Note (Signed)
Addended by: Lonia Farber on: 08/28/2023 01:45 PM   Modules accepted: Orders

## 2023-08-29 LAB — COMPREHENSIVE METABOLIC PANEL
ALT: 15 [IU]/L (ref 0–32)
AST: 19 [IU]/L (ref 0–40)
Albumin: 4.6 g/dL (ref 3.7–4.7)
Alkaline Phosphatase: 72 [IU]/L (ref 44–121)
BUN/Creatinine Ratio: 16 (ref 12–28)
BUN: 15 mg/dL (ref 8–27)
Bilirubin Total: 0.8 mg/dL (ref 0.0–1.2)
CO2: 21 mmol/L (ref 20–29)
Calcium: 9.4 mg/dL (ref 8.7–10.3)
Chloride: 102 mmol/L (ref 96–106)
Creatinine, Ser: 0.96 mg/dL (ref 0.57–1.00)
Globulin, Total: 2.3 g/dL (ref 1.5–4.5)
Glucose: 95 mg/dL (ref 70–99)
Potassium: 4.4 mmol/L (ref 3.5–5.2)
Sodium: 142 mmol/L (ref 134–144)
Total Protein: 6.9 g/dL (ref 6.0–8.5)
eGFR: 57 mL/min/{1.73_m2} — ABNORMAL LOW (ref >59)

## 2023-08-29 LAB — LDL CHOLESTEROL, DIRECT: LDL Direct: 69 mg/dL (ref 0–99)

## 2023-08-29 LAB — MAGNESIUM: Magnesium: 2.5 mg/dL — ABNORMAL HIGH (ref 1.6–2.3)

## 2023-09-08 ENCOUNTER — Other Ambulatory Visit: Payer: Self-pay | Admitting: Family Medicine

## 2023-09-08 NOTE — Telephone Encounter (Signed)
Requesting: tramadol 50mg   Contract: 08/07/22 UDS: 07/20/23 Last Visit: 07/20/23 Next Visit: 12/16/23 Last Refill: 06/19/23 #60 and 0RF   Please Advise

## 2023-09-09 NOTE — Telephone Encounter (Signed)
PDMP reviewed, Rx sent

## 2023-09-17 ENCOUNTER — Telehealth: Payer: Self-pay

## 2023-09-17 NOTE — Telephone Encounter (Signed)
 Patient notified of results and verbalized understanding.

## 2023-09-17 NOTE — Telephone Encounter (Signed)
-----   Message from Ralene Burger sent at 09/07/2023 11:16 AM EST ----- All laboratory test came good.

## 2023-09-21 ENCOUNTER — Other Ambulatory Visit: Payer: Self-pay | Admitting: Internal Medicine

## 2023-10-08 ENCOUNTER — Other Ambulatory Visit: Payer: Self-pay | Admitting: Internal Medicine

## 2023-10-13 ENCOUNTER — Other Ambulatory Visit: Payer: Self-pay | Admitting: Internal Medicine

## 2023-10-26 ENCOUNTER — Telehealth: Payer: Self-pay | Admitting: Internal Medicine

## 2023-10-26 NOTE — Telephone Encounter (Signed)
 Requesting: tramadol 50mg   Contract: 08/07/22 UDS: 07/20/23 Last Visit: 07/20/23 Next Visit: 12/16/23 Last Refill: 09/09/23 #60 and 0RF   Please Advise

## 2023-10-28 NOTE — Telephone Encounter (Signed)
 PDMP okay, Rx sent

## 2023-11-02 ENCOUNTER — Emergency Department (HOSPITAL_COMMUNITY)

## 2023-11-02 ENCOUNTER — Other Ambulatory Visit: Payer: Self-pay

## 2023-11-02 ENCOUNTER — Ambulatory Visit: Payer: Self-pay | Admitting: *Deleted

## 2023-11-02 ENCOUNTER — Encounter (HOSPITAL_COMMUNITY): Payer: Self-pay

## 2023-11-02 ENCOUNTER — Inpatient Hospital Stay (HOSPITAL_COMMUNITY)
Admission: EM | Admit: 2023-11-02 | Discharge: 2023-11-04 | DRG: 291 | Disposition: A | Discharge status: Home / Self Care | Attending: Internal Medicine | Admitting: Internal Medicine

## 2023-11-02 DIAGNOSIS — K222 Esophageal obstruction: Secondary | ICD-10-CM | POA: Diagnosis present

## 2023-11-02 DIAGNOSIS — E66811 Obesity, class 1: Secondary | ICD-10-CM | POA: Diagnosis not present

## 2023-11-02 DIAGNOSIS — K219 Gastro-esophageal reflux disease without esophagitis: Secondary | ICD-10-CM | POA: Diagnosis not present

## 2023-11-02 DIAGNOSIS — Z7902 Long term (current) use of antithrombotics/antiplatelets: Secondary | ICD-10-CM

## 2023-11-02 DIAGNOSIS — R0602 Shortness of breath: Secondary | ICD-10-CM | POA: Diagnosis not present

## 2023-11-02 DIAGNOSIS — I7121 Aneurysm of the ascending aorta, without rupture: Secondary | ICD-10-CM | POA: Diagnosis present

## 2023-11-02 DIAGNOSIS — Z833 Family history of diabetes mellitus: Secondary | ICD-10-CM

## 2023-11-02 DIAGNOSIS — Z1152 Encounter for screening for COVID-19: Secondary | ICD-10-CM | POA: Diagnosis not present

## 2023-11-02 DIAGNOSIS — Z885 Allergy status to narcotic agent status: Secondary | ICD-10-CM | POA: Diagnosis not present

## 2023-11-02 DIAGNOSIS — I110 Hypertensive heart disease with heart failure: Secondary | ICD-10-CM | POA: Diagnosis not present

## 2023-11-02 DIAGNOSIS — Z6831 Body mass index (BMI) 31.0-31.9, adult: Secondary | ICD-10-CM

## 2023-11-02 DIAGNOSIS — Z881 Allergy status to other antibiotic agents status: Secondary | ICD-10-CM

## 2023-11-02 DIAGNOSIS — Z8249 Family history of ischemic heart disease and other diseases of the circulatory system: Secondary | ICD-10-CM | POA: Diagnosis not present

## 2023-11-02 DIAGNOSIS — I10 Essential (primary) hypertension: Secondary | ICD-10-CM | POA: Diagnosis not present

## 2023-11-02 DIAGNOSIS — Z91040 Latex allergy status: Secondary | ICD-10-CM

## 2023-11-02 DIAGNOSIS — Z8673 Personal history of transient ischemic attack (TIA), and cerebral infarction without residual deficits: Secondary | ICD-10-CM

## 2023-11-02 DIAGNOSIS — E876 Hypokalemia: Secondary | ICD-10-CM

## 2023-11-02 DIAGNOSIS — Z79890 Hormone replacement therapy (postmenopausal): Secondary | ICD-10-CM | POA: Diagnosis not present

## 2023-11-02 DIAGNOSIS — R918 Other nonspecific abnormal finding of lung field: Secondary | ICD-10-CM | POA: Diagnosis not present

## 2023-11-02 DIAGNOSIS — I5033 Acute on chronic diastolic (congestive) heart failure: Secondary | ICD-10-CM | POA: Insufficient documentation

## 2023-11-02 DIAGNOSIS — I509 Heart failure, unspecified: Secondary | ICD-10-CM

## 2023-11-02 DIAGNOSIS — E785 Hyperlipidemia, unspecified: Secondary | ICD-10-CM | POA: Diagnosis present

## 2023-11-02 DIAGNOSIS — Z90710 Acquired absence of both cervix and uterus: Secondary | ICD-10-CM | POA: Diagnosis not present

## 2023-11-02 DIAGNOSIS — I2510 Atherosclerotic heart disease of native coronary artery without angina pectoris: Secondary | ICD-10-CM | POA: Diagnosis not present

## 2023-11-02 DIAGNOSIS — E039 Hypothyroidism, unspecified: Secondary | ICD-10-CM | POA: Diagnosis not present

## 2023-11-02 DIAGNOSIS — I517 Cardiomegaly: Secondary | ICD-10-CM | POA: Diagnosis not present

## 2023-11-02 DIAGNOSIS — Z888 Allergy status to other drugs, medicaments and biological substances status: Secondary | ICD-10-CM | POA: Diagnosis not present

## 2023-11-02 DIAGNOSIS — Z800 Family history of malignant neoplasm of digestive organs: Secondary | ICD-10-CM

## 2023-11-02 DIAGNOSIS — I700 Atherosclerosis of aorta: Secondary | ICD-10-CM | POA: Diagnosis not present

## 2023-11-02 DIAGNOSIS — I890 Lymphedema, not elsewhere classified: Secondary | ICD-10-CM | POA: Diagnosis present

## 2023-11-02 DIAGNOSIS — Z7982 Long term (current) use of aspirin: Secondary | ICD-10-CM

## 2023-11-02 DIAGNOSIS — N179 Acute kidney failure, unspecified: Secondary | ICD-10-CM | POA: Diagnosis not present

## 2023-11-02 DIAGNOSIS — Z79899 Other long term (current) drug therapy: Secondary | ICD-10-CM

## 2023-11-02 DIAGNOSIS — Z860101 Personal history of adenomatous and serrated colon polyps: Secondary | ICD-10-CM | POA: Diagnosis not present

## 2023-11-02 DIAGNOSIS — I5023 Acute on chronic systolic (congestive) heart failure: Principal | ICD-10-CM

## 2023-11-02 DIAGNOSIS — E669 Obesity, unspecified: Secondary | ICD-10-CM

## 2023-11-02 DIAGNOSIS — Z981 Arthrodesis status: Secondary | ICD-10-CM

## 2023-11-02 DIAGNOSIS — R911 Solitary pulmonary nodule: Secondary | ICD-10-CM | POA: Diagnosis not present

## 2023-11-02 DIAGNOSIS — E7849 Other hyperlipidemia: Secondary | ICD-10-CM | POA: Diagnosis not present

## 2023-11-02 DIAGNOSIS — Z825 Family history of asthma and other chronic lower respiratory diseases: Secondary | ICD-10-CM

## 2023-11-02 LAB — CBC WITH DIFFERENTIAL/PLATELET
Abs Immature Granulocytes: 0.02 10*3/uL (ref 0.00–0.07)
Basophils Absolute: 0 10*3/uL (ref 0.0–0.1)
Basophils Relative: 0 %
Eosinophils Absolute: 0 10*3/uL (ref 0.0–0.5)
Eosinophils Relative: 1 %
HCT: 44.1 % (ref 36.0–46.0)
Hemoglobin: 14.9 g/dL (ref 12.0–15.0)
Immature Granulocytes: 0 %
Lymphocytes Relative: 27 %
Lymphs Abs: 1.9 10*3/uL (ref 0.7–4.0)
MCH: 31.6 pg (ref 26.0–34.0)
MCHC: 33.8 g/dL (ref 30.0–36.0)
MCV: 93.4 fL (ref 80.0–100.0)
Monocytes Absolute: 0.5 10*3/uL (ref 0.1–1.0)
Monocytes Relative: 7 %
Neutro Abs: 4.5 10*3/uL (ref 1.7–7.7)
Neutrophils Relative %: 65 %
Platelets: 241 10*3/uL (ref 150–400)
RBC: 4.72 MIL/uL (ref 3.87–5.11)
RDW: 12.9 % (ref 11.5–15.5)
WBC: 7.1 10*3/uL (ref 4.0–10.5)
nRBC: 0 % (ref 0.0–0.2)

## 2023-11-02 LAB — COMPREHENSIVE METABOLIC PANEL
ALT: 17 U/L (ref 0–44)
AST: 27 U/L (ref 15–41)
Albumin: 4.5 g/dL (ref 3.5–5.0)
Alkaline Phosphatase: 59 U/L (ref 38–126)
Anion gap: 13 (ref 5–15)
BUN: 10 mg/dL (ref 8–23)
CO2: 22 mmol/L (ref 22–32)
Calcium: 10 mg/dL (ref 8.9–10.3)
Chloride: 104 mmol/L (ref 98–111)
Creatinine, Ser: 1.18 mg/dL — ABNORMAL HIGH (ref 0.44–1.00)
GFR, Estimated: 45 mL/min — ABNORMAL LOW (ref >60)
Glucose, Bld: 159 mg/dL — ABNORMAL HIGH (ref 70–99)
Potassium: 3.4 mmol/L — ABNORMAL LOW (ref 3.5–5.1)
Sodium: 139 mmol/L (ref 135–145)
Total Bilirubin: 0.9 mg/dL (ref 0.0–1.2)
Total Protein: 7.7 g/dL (ref 6.5–8.1)

## 2023-11-02 LAB — RESP PANEL BY RT-PCR (RSV, FLU A&B, COVID)  RVPGX2
Influenza A by PCR: NEGATIVE
Influenza B by PCR: NEGATIVE
Resp Syncytial Virus by PCR: NEGATIVE
SARS Coronavirus 2 by RT PCR: NEGATIVE

## 2023-11-02 LAB — I-STAT CG4 LACTIC ACID, ED
Lactic Acid, Venous: 2.1 mmol/L (ref 0.5–1.9)
Lactic Acid, Venous: 1.4 mmol/L (ref 0.5–1.9)
Lactic Acid, Venous: 1.8 mmol/L (ref 0.5–1.9)

## 2023-11-02 LAB — URINALYSIS, ROUTINE W REFLEX MICROSCOPIC
Bilirubin Urine: NEGATIVE
Glucose, UA: NEGATIVE mg/dL
Hgb urine dipstick: NEGATIVE
Ketones, ur: NEGATIVE mg/dL
Leukocytes,Ua: NEGATIVE
Nitrite: NEGATIVE
Protein, ur: NEGATIVE mg/dL
Specific Gravity, Urine: 1.004 — ABNORMAL LOW (ref 1.005–1.030)
pH: 7 (ref 5.0–8.0)

## 2023-11-02 LAB — BRAIN NATRIURETIC PEPTIDE: B Natriuretic Peptide: 98.3 pg/mL (ref 0.0–100.0)

## 2023-11-02 LAB — TROPONIN I (HIGH SENSITIVITY)
Troponin I (High Sensitivity): 5 ng/L (ref <18)
Troponin I (High Sensitivity): 6 ng/L (ref <18)

## 2023-11-02 MED ORDER — IOHEXOL 350 MG/ML SOLN
75.0000 mL | Freq: Once | INTRAVENOUS | Status: AC | PRN
Start: 2023-11-02 — End: 2023-11-02
  Administered 2023-11-02: 75 mL via INTRAVENOUS

## 2023-11-02 MED ORDER — ONDANSETRON HCL 4 MG/2ML IJ SOLN: 4.0000 mg | Freq: Four times a day (QID) | INTRAMUSCULAR | Status: DC | PRN

## 2023-11-02 MED ORDER — FUROSEMIDE 10 MG/ML IJ SOLN
40.0000 mg | Freq: Two times a day (BID) | INTRAMUSCULAR | Status: DC
Start: 2023-11-03 — End: 2023-11-03
  Administered 2023-11-03: 40 mg via INTRAVENOUS
  Filled 2023-11-02: qty 4

## 2023-11-02 MED ORDER — FUROSEMIDE 10 MG/ML IJ SOLN
40.0000 mg | Freq: Once | INTRAMUSCULAR | Status: AC
  Administered 2023-11-02: 40 mg via INTRAVENOUS
  Filled 2023-11-02: qty 4

## 2023-11-02 MED ORDER — FUROSEMIDE 10 MG/ML IJ SOLN: 40.0000 mg | Freq: Two times a day (BID) | INTRAMUSCULAR | Status: DC

## 2023-11-02 MED ORDER — SODIUM CHLORIDE 0.9% FLUSH: 3.0000 mL | INTRAVENOUS | Status: DC | PRN

## 2023-11-02 MED ORDER — SODIUM CHLORIDE 0.9% FLUSH
3.0000 mL | Freq: Two times a day (BID) | INTRAVENOUS | Status: DC
  Administered 2023-11-03 (×2): 3 mL via INTRAVENOUS

## 2023-11-02 MED ORDER — SODIUM CHLORIDE 0.9 % IV SOLN: 250.0000 mL | INTRAVENOUS | Status: DC | PRN

## 2023-11-02 MED ORDER — ACETAMINOPHEN 325 MG PO TABS: 650.0000 mg | ORAL_TABLET | ORAL | Status: DC | PRN

## 2023-11-02 MED ORDER — ENOXAPARIN SODIUM 40 MG/0.4ML IJ SOSY
40.0000 mg | PREFILLED_SYRINGE | Freq: Every day | INTRAMUSCULAR | Status: DC
  Administered 2023-11-03: 40 mg via SUBCUTANEOUS
  Filled 2023-11-02: qty 0.4

## 2023-11-02 NOTE — ED Notes (Signed)
 EDP notified on elevated Lactic acid .

## 2023-11-02 NOTE — ED Provider Triage Note (Signed)
 Emergency Medicine Provider Triage Evaluation Note  Kathleen Thompson , a 89 y.o. female  was evaluated in triage.  Pt complains of weight gain of 20-30 pounds in the last 3-4 months. Reports increased SOB and chest heavinessx3 days.  Review of Systems  Positive: Sob,chest heaviness Negative: Abdominal pain  Physical Exam  BP (!) 146/83   Pulse 91   Temp 97.9 F (36.6 C)   Resp 20   Ht 5\' 2"  (1.575 m)   Wt 80.3 kg   SpO2 100%   BMI 32.38 kg/m  Gen:   Awake, no distress   Resp:  Normal effort  MSK:   Moves extremities without difficulty  Other:  4+ pitting edema of BLE  Medical Decision Making  Medically screening exam initiated at 6:13 PM.  Appropriate orders placed.  Kathleen Thompson was informed that the remainder of the evaluation will be completed by another provider, this initial triage assessment does not replace that evaluation, and the importance of remaining in the ED until their evaluation is complete.     Kathleen Thompson, [place] 11/02/23 [phone]

## 2023-11-02 NOTE — ED Triage Notes (Signed)
 Pt c/o midsternal chest heaviness and SOBx2d. Pt states it got worse today. Pt has 4+ edema of LE bilat. Pt has dyspnea w/exertion

## 2023-11-02 NOTE — Telephone Encounter (Signed)
 Copied from CRM [id number]. Topic: Clinical - Red Word Triage >> Nov 02, 2023  3:16 PM Adele Barthel wrote: Red Word that prompted transfer to Nurse Triage:   Shortness of breath for past couple of days Has gotten worse Feel like fluttering in chest History of congestive heart failure Reason for Disposition  [1] MODERATE difficulty breathing (e.g., speaks in phrases, SOB even at rest, pulse 100-120) AND [2] NEW-onset or WORSE than normal  Answer Assessment - Initial Assessment Questions 1. RESPIRATORY STATUS: "Describe your breathing?" (e.g., wheezing, shortness of breath, unable to speak, severe coughing)      I'm having shortness of breath and I have a fluttering in my chest.   I have CHF and I'm getting the shakes.    My nose is running and my eyes are watering.    I don't feel good at all. It's a fluttering not pain.     2. ONSET: "When did this breathing problem begin?"      Just a couple of days. 3. PATTERN "Does the difficult breathing come and go, or has it been constant since it started?"      Its constant for the last 2 days and getting worse. 4. SEVERITY: "How bad is your breathing?" (e.g., mild, moderate, severe)    - MILD: No SOB at rest, mild SOB with walking, speaks normally in sentences, can lie down, no retractions, pulse < 100.    - MODERATE: SOB at rest, SOB with minimal exertion and prefers to sit, cannot lie down flat, speaks in phrases, mild retractions, audible wheezing, pulse 100-120.    - SEVERE: Very SOB at rest, speaks in single words, struggling to breathe, sitting hunched forward, retractions, pulse > 120      Severe 5. RECURRENT SYMPTOM: "Have you had difficulty breathing before?" If Yes, ask: "When was the last time?" and "What happened that time?"      Yes   I have CHF 6. CARDIAC HISTORY: "Do you have any history of heart disease?" (e.g., heart attack, angina, bypass surgery, angioplasty)      Yes   CHF 7. LUNG HISTORY: "Do you have any history of lung disease?"   (e.g., pulmonary embolus, asthma, emphysema)     Not asked 8. CAUSE: "What do you think is causing the breathing problem?"      I'm having fluttering in my chest.   I have CHF. 9. OTHER SYMPTOMS: "Do you have any other symptoms? (e.g., dizziness, runny nose, cough, chest pain, fever)     Runny nose and not feeling well, shaky along with the shortness of breath and fluttering in her chest. 10. O2 SATURATION MONITOR:  "Do you use an oxygen saturation monitor (pulse oximeter) at home?" If Yes, ask: "What is your reading (oxygen level) today?" "What is your usual oxygen saturation reading?" (e.g., 95%)       N/A 11. PREGNANCY: "Is there any chance you are pregnant?" "When was your last menstrual period?"       N/A due to age 89. TRAVEL: "Have you traveled out of the country in the last month?" (e.g., travel history, exposures)       N/A  Protocols used: Breathing Difficulty-A-AH  Chief Complaint: Severe shortness of breath and fluttering in her chest.    Symptoms: Shaking, runny nose and "I just don't feel like myself and I normally feel good".    I have congestive heart failure.   She is talking in 3-4 word phrases with me and having to  take frequent breaths. Frequency: For the last 2 days and getting worse Pertinent Negatives: Patient denies chest pain or pressure. Disposition: [x] ED /[] Urgent Care (no appt availability in office) / [] Appointment(In office/virtual)/ []  [facility]/ [] Home Care/ [] Refused Recommended Disposition /[]  Mobile Bus/ []  Follow-up with PCP Additional Notes: I referred her to the ED which she was agreeable to going.   She has someone who can take her now and is going to [facility].

## 2023-11-02 NOTE — ED Provider Notes (Signed)
 [place] EMERGENCY DEPARTMENT AT [facility] Provider Note   CSN: [id number] Arrival date & time: 11/02/23  1621     History  Chief Complaint  Patient presents with   Shortness of Breath   Chest Pain    Kathleen Thompson is a 89 y.o. female.  89 yo F with a chief complaint of difficulty breathing.  Going on for about 3 to 4 days now.  She is like it gets once worse when she gets up and tries to walk.  Has a pressure across the chest as well.  She denies cough congestion or fever.  She has what sounds like chronic lower extremity edema which is maybe a little bit worse recently.  She has never had a problem like this before.  She has a history of heart failure and takes Lasix every day.  States that she has had pretty good output with it.   Shortness of Breath Associated symptoms: chest pain   Chest Pain Associated symptoms: shortness of breath        Home Medications Prior to Admission medications   Medication Sig Start Date End Date Taking? Authorizing Provider  losartan (COZAAR) 50 MG tablet Take 1 tablet (50 mg total) by mouth daily. 09/21/23   Wanda Plump, MD  aspirin 81 MG tablet Take 81 mg by mouth daily.    [provider]  CALCIUM PO Take 1 tablet by mouth 2 (two) times daily.    [provider]  clopidogrel (PLAVIX) 75 MG tablet Take 1 tablet (75 mg total) by mouth daily. 10/13/23   Wanda Plump, MD  Coenzyme Q10 (CO Q-10 PO) Take 1 tablet by mouth daily.    [provider]  ezetimibe (ZETIA) 10 MG tablet Take 1 tablet (10 mg total) by mouth daily. 12/13/21   Wanda Plump, MD  furosemide (LASIX) 40 MG tablet Take 1 tablet (40 mg total) by mouth daily. 06/11/23   Georgeanna Lea, MD  isosorbide mononitrate (IMDUR) 30 MG 24 hr tablet Take 1 tablet by mouth once daily 10/08/23   Wanda Plump, MD  levothyroxine (SYNTHROID) 50 MCG tablet Take 1 tablet (50 mcg total) by mouth daily before breakfast. 08/10/23   Wanda Plump, MD  magnesium  30 MG tablet Take 30 mg by mouth daily.    [provider]  pantoprazole (PROTONIX) 40 MG tablet Take 1 tablet (40 mg total) by mouth daily before breakfast. 07/07/23   Wanda Plump, MD  potassium chloride (KLOR-CON) 10 MEQ tablet Take 1 tablet (10 mEq total) by mouth daily. 01/21/23   Georgeanna Lea, MD  pregabalin (LYRICA) 25 MG capsule Take 1 capsule (25 mg total) by mouth 2 (two) times daily. 07/20/23   Wanda Plump, MD  rosuvastatin (CRESTOR) 40 MG tablet Take 1 tablet (40 mg total) by mouth at bedtime. 10/13/23   Wanda Plump, MD  traMADol (ULTRAM) 50 MG tablet Take 1 tablet (50 mg total) by mouth every 12 (twelve) hours as needed. 10/28/23   Wanda Plump, MD  vitamin D, CHOLECALCIFEROL, 400 UNITS tablet Take 400 Units by mouth daily.    [provider]      Allergies    Clindamycin/lincomycin, Cymbalta [duloxetine hcl], Gabapentin, Hydrocodone, Rofecoxib, and Latex    Review of Systems   Review of Systems  Respiratory:  Positive for shortness of breath.   Cardiovascular:  Positive for chest pain.    Physical Exam Updated Vital Signs  BP (!) 147/67   Pulse 77   Temp 98.2 F (36.8 C) (Oral)   Resp (!) 22   Ht 5\' 2"  (1.575 m)   Wt 80.3 kg   SpO2 100%   BMI 32.38 kg/m  Physical Exam Vitals and nursing note reviewed.  Constitutional:      General: She is not in acute distress.    Appearance: She is well-developed. She is not diaphoretic.  HENT:     Head: Normocephalic and atraumatic.  Eyes:     Pupils: Pupils are equal, round, and reactive to light.  Cardiovascular:     Rate and Rhythm: Normal rate and regular rhythm.     Heart sounds: No murmur heard.    No friction rub. No gallop.  Pulmonary:     Effort: Pulmonary effort is normal. Tachypnea present.     Breath sounds: No wheezing or rales.  Abdominal:     General: There is no distension.     Palpations: Abdomen is soft.     Tenderness: There is no abdominal tenderness.  Musculoskeletal:         General: No tenderness.     Cervical back: Normal range of motion and neck supple.     Right lower leg: Edema present.     Left lower leg: Edema present.     Comments: 4+ edema up to the knees bilaterally  Skin:    General: Skin is warm and dry.  Neurological:     Mental Status: She is alert and oriented to person, place, and time.  Psychiatric:        Behavior: Behavior normal.     ED Results / Procedures / Treatments   Labs (all labs ordered are listed, but only abnormal results are displayed) Labs Reviewed  COMPREHENSIVE METABOLIC PANEL - Abnormal; Notable for the following components:      Result Value   Potassium 3.4 (*)    Glucose, Bld 159 (*)    Creatinine, Ser 1.18 (*)    GFR, Estimated 45 (*)    All other components within normal limits  URINALYSIS, ROUTINE W REFLEX MICROSCOPIC - Abnormal; Notable for the following components:   Color, Urine COLORLESS (*)    Specific Gravity, Urine 1.004 (*)    All other components within normal limits  I-STAT CG4 LACTIC ACID, ED - Abnormal; Notable for the following components:   Lactic Acid, Venous 2.1 (*)    All other components within normal limits  RESP PANEL BY RT-PCR (RSV, FLU A&B, COVID)  [id number]  CBC WITH DIFFERENTIAL/PLATELET  BRAIN NATRIURETIC PEPTIDE  I-STAT CG4 LACTIC ACID, ED  TROPONIN I (HIGH SENSITIVITY)  TROPONIN I (HIGH SENSITIVITY)    EKG EKG Interpretation Date/Time:  Monday November 02 2023 17:00:19 EDT Ventricular Rate:  85 PR Interval:  136 QRS Duration:  78 QT Interval:  378 QTC Calculation: 449 R Axis:   66  Text Interpretation: Normal sinus rhythm Nonspecific T wave abnormality Abnormal ECG No significant change since last tracing Confirmed by Melene Plan [phone]) on 11/02/2023 7:33:31 PM  Radiology CT Angio Chest PE W and/or Wo Contrast Result Date: 11/02/2023 CLINICAL DATA:  High probability for PE. EXAM: CT ANGIOGRAPHY CHEST WITH CONTRAST TECHNIQUE: Multidetector CT imaging of the chest was  performed using the standard protocol during bolus administration of intravenous contrast. Multiplanar CT image reconstructions and MIPs were obtained to evaluate the vascular anatomy. RADIATION DOSE REDUCTION: This exam was performed according to the departmental dose-optimization program which includes automated exposure control,  adjustment of the mA and/or kV according to patient size and/or use of iterative reconstruction technique. CONTRAST:  75mL OMNIPAQUE IOHEXOL 350 MG/ML SOLN COMPARISON:  CT chest 12/19/2012 FINDINGS: Cardiovascular: There is aneurysmal dilatation of the ascending aorta measuring 4 cm, unchanged. There is adequate opacification of the pulmonary arteries to the segmental level. There is no evidence for pulmonary embolism. Heart is mildly enlarged. There is no pericardial effusion. There are atherosclerotic calcifications of the aorta. Mediastinum/Nodes: No enlarged mediastinal, hilar, or axillary lymph nodes. Thyroid gland, trachea, and esophagus demonstrate no significant findings. There is a small hiatal hernia. Lungs/Pleura: There has been interval surgical removal of previously identified right upper lobe nodule. There is a small amount of atelectasis in the bilateral upper lobes inferiorly. There are mild ground-glass opacities in the central lungs and upper lobes bilaterally. There is no lung consolidation, pleural effusion or pneumothorax. Upper Abdomen: No acute abnormality. Musculoskeletal: No chest wall abnormality. No acute or significant osseous findings. Review of the MIP images confirms the above findings. IMPRESSION: 1. No evidence for pulmonary embolism. 2. Mild cardiomegaly. 3. Mild ground-glass opacities in the central lungs and upper lobes bilaterally may be related to edema or infection. 4. Stable aneurysmal dilatation of the ascending aorta measuring 4 cm. Recommend follow-up CT/MR every 12 months and vascular consultation. 5. There has been interval surgical removal of  previously identified right upper lobe nodule. Aortic Atherosclerosis (ICD10-I70.0). Electronically Signed   By: Darliss Cheney M.D.   On: 11/02/2023 22:08   DG Chest 1 View Result Date: 11/02/2023 CLINICAL DATA:  Shortness of breath for 2 days, initial encounter EXAM: PORTABLE CHEST 1 VIEW COMPARISON:  02/12/2022 FINDINGS: Cardiac shadow is within normal limits. Aortic calcifications are seen. Postoperative changes are noted in the right upper lobe. The lungs are well aerated bilaterally. No focal infiltrate or effusion is seen. No bony abnormality is noted. IMPRESSION: No active disease. Electronically Signed   By: Alcide Clever M.D.   On: 11/02/2023 19:32    Procedures Procedures    Medications Ordered in ED Medications  furosemide (LASIX) injection 40 mg (40 mg Intravenous Given 11/02/23 2006)  iohexol (OMNIPAQUE) 350 MG/ML injection 75 mL (75 mLs Intravenous Contrast Given 11/02/23 2132)    ED Course/ Medical Decision Making/ A&P                                 Medical Decision Making Amount and/or Complexity of Data Reviewed Labs: ordered. Radiology: ordered. ECG/medicine tests: ordered.  Risk Prescription drug management. Decision regarding hospitalization.   89 yo F with a chief complaints of difficulty breathing.  This been going on for about 4 days now.  Worse with exertion and improves with rest.  EKG without obvious ischemic changes.  Chest x-ray independently interpreted by me without focal infiltrate or pneumothorax.  Initial troponin is negative.  She is quite tachypneic on initial exam.  Her lung sounds for me are clear.  She does have significant lower extremity edema though she states this is a chronic problem for her.  Perhaps this is due to heart failure exacerbation though has relatively low BNP.  Will give a bolus dose of Lasix.  CT angiogram of the chest if able.  CT scan of the chest with no PE, no obvious pneumonia.  Radiology read is possible edema.  Fits  best with the clinical scenario.  She is feeling a little bit better after a bolus  dose of Lasix.  Discussed with hospitalist for admission.  The patients results and plan were reviewed and discussed.   Any x-rays performed were independently reviewed by myself.   Differential diagnosis were considered with the presenting HPI.  Medications  furosemide (LASIX) injection 40 mg (40 mg Intravenous Given 11/02/23 2006)  iohexol (OMNIPAQUE) 350 MG/ML injection 75 mL (75 mLs Intravenous Contrast Given 11/02/23 2132)    Vitals:   11/02/23 2051 11/02/23 2100 11/02/23 2139 11/02/23 2145  BP: 132/77 122/65  (!) 147/67  Pulse: 80 78  77  Resp: 18 19  (!) 22  Temp:   98.2 F (36.8 C)   TempSrc:   Oral   SpO2: 100% 99%  100%  Weight:      Height:        Final diagnoses:  Acute on chronic systolic congestive heart failure (HCC)    Admission/ observation were discussed with the admitting physician, patient and/or family and they are comfortable with the plan.          Final Clinical Impression(s) / ED Diagnoses Final diagnoses:  Acute on chronic systolic congestive heart failure [facility])    Rx / DC Orders ED Discharge Orders     None         Melene Plan, DO 11/02/23 2239

## 2023-11-02 NOTE — H&P (Signed)
 History and Physical    Patient: Kathleen Thompson ZOX:[id number] DOB: 01-Sep-1933 DOA: 11/02/2023 DOS: the patient was seen and examined on 11/02/2023 PCP: Wanda Plump, MD  Patient coming from: Home  Chief Complaint:  Chief Complaint  Patient presents with   Shortness of Breath   Chest Pain   HPI: Kathleen Thompson is a 89 y.o. female with medical history significant of essential hypertension, hypothyroidism, GERD with esophageal stricture, history of TIAs, hyperlipidemia, chronic lower extremity edema on Lasix at home as well as coronary artery disease who presented to the ER with progressive worsening of lower extremity edema.  Patient has gained about 20 pounds.  Has also noted progressive worsening shortness of breath in the last 2 days.  The dyspnea is worsened with exertion.  No fever no chills.  No cough.  Patient seen in the ER.  She appears to have some kind of anasarca..  Does not have documented history of CHF in our system.  Patient therefore being admitted for further evaluation and treatment  Review of Systems: As mentioned in the history of present illness. All other systems reviewed and are negative. Past Medical History:  Diagnosis Date   Adenomatous colon polyp 02/2003   Arthritis    Blood transfusion    Cataract    Diverticulosis    Esophageal stricture    Gastroparesis    GERD (gastroesophageal reflux disease)    Headache    Hyperlipidemia    Hypertension    Hypothyroidism    Internal hemorrhoid    Iron deficiency anemia    Osteoporosis    PONV (postoperative nausea and vomiting)    PVD (peripheral vascular disease) (HCC)    Stroke (HCC)    TIA   TIA (transient ischemic attack)    Past Surgical History:  Procedure Laterality Date   ABDOMINAL HYSTERECTOMY     APPENDECTOMY     CERVICAL FUSION     EYE SURGERY Bilateral 08/2016   cataracts   LEFT HEART CATHETERIZATION WITH CORONARY ANGIOGRAM N/A 12/20/2012   Procedure: LEFT HEART CATHETERIZATION WITH  CORONARY ANGIOGRAM;  Surgeon: Peter M Swaziland, MD;  [facility] CATH LAB;  Service: Cardiovascular;  Laterality: N/A;   shoulder surgery      VIDEO ASSISTED THORACOSCOPY (VATS)/WEDGE RESECTION Right 03/14/2013   Procedure: VIDEO ASSISTED THORACOSCOPY (VATS)/WEDGE RESECTION;  Surgeon: Loreli Slot, MD;  Location: [facility] OR;  Service: Thoracic;  Laterality: Right;   Social History:  reports that she has never smoked. She has never used smokeless tobacco. She reports that she does not drink alcohol and does not use drugs.  Allergies  Allergen Reactions   Clindamycin/Lincomycin Rash   Cymbalta [Duloxetine Hcl]     Jittery, nervous, could not sleep   Gabapentin Other (See Comments)    Causes bad headaches   Hydrocodone Hives   Rofecoxib     hives   Latex Rash    Pt states that latex causes blisters to skin    Family History  Problem Relation Age of Onset   Diabetes Mother    Heart attack Mother    Heart attack Father    Asthma Father    Colon cancer Brother        at age 69   Colon cancer Sister        at age 42   Diabetes Maternal Aunt    Diabetes Maternal Uncle    Heart attack Sister    Heart attack Brother    Heart attack Brother  Heart attack Brother    Heart attack Brother    Heart attack Sister    Breast cancer Neg Hx     Prior to Admission medications   Medication Sig Start Date End Date Taking? Authorizing Provider  losartan (COZAAR) 50 MG tablet Take 1 tablet (50 mg total) by mouth daily. 09/21/23   Wanda Plump, MD  aspirin 81 MG tablet Take 81 mg by mouth daily.    [provider]  CALCIUM PO Take 1 tablet by mouth 2 (two) times daily.    [provider]  clopidogrel (PLAVIX) 75 MG tablet Take 1 tablet (75 mg total) by mouth daily. 10/13/23   Wanda Plump, MD  Coenzyme Q10 (CO Q-10 PO) Take 1 tablet by mouth daily.    [provider]  ezetimibe (ZETIA) 10 MG tablet Take 1 tablet (10 mg total) by mouth daily. 12/13/21   Wanda Plump, MD   furosemide (LASIX) 40 MG tablet Take 1 tablet (40 mg total) by mouth daily. 06/11/23   Georgeanna Lea, MD  isosorbide mononitrate (IMDUR) 30 MG 24 hr tablet Take 1 tablet by mouth once daily 10/08/23   Wanda Plump, MD  levothyroxine (SYNTHROID) 50 MCG tablet Take 1 tablet (50 mcg total) by mouth daily before breakfast. 08/10/23   Wanda Plump, MD  magnesium 30 MG tablet Take 30 mg by mouth daily.    [provider]  pantoprazole (PROTONIX) 40 MG tablet Take 1 tablet (40 mg total) by mouth daily before breakfast. 07/07/23   Wanda Plump, MD  potassium chloride (KLOR-CON) 10 MEQ tablet Take 1 tablet (10 mEq total) by mouth daily. 01/21/23   Georgeanna Lea, MD  pregabalin (LYRICA) 25 MG capsule Take 1 capsule (25 mg total) by mouth 2 (two) times daily. 07/20/23   Wanda Plump, MD  rosuvastatin (CRESTOR) 40 MG tablet Take 1 tablet (40 mg total) by mouth at bedtime. 10/13/23   Wanda Plump, MD  traMADol (ULTRAM) 50 MG tablet Take 1 tablet (50 mg total) by mouth every 12 (twelve) hours as needed. 10/28/23   Wanda Plump, MD  vitamin D, CHOLECALCIFEROL, 400 UNITS tablet Take 400 Units by mouth daily.    [provider]    Physical Exam: Vitals:   11/02/23 2051 11/02/23 2100 11/02/23 2139 11/02/23 2145  BP: 132/77 122/65  (!) 147/67  Pulse: 80 78  77  Resp: 18 19  (!) 22  Temp:   98.2 F (36.8 C)   TempSrc:   Oral   SpO2: 100% 99%  100%  Weight:      Height:       Constitutional: Chronically ill looking, NAD, calm, comfortable Eyes: PERRL, lids and conjunctivae normal ENMT: Mucous membranes are moist. Posterior pharynx clear of any exudate or lesions.Normal dentition.  Neck: normal, supple, no masses, no thyromegaly Respiratory: Coarse breath sounds bilaterally with some basal crackles normal respiratory effort. No accessory muscle use.  Cardiovascular: Regular rate and rhythm, no murmurs / rubs / gallops.  4+ extremity edema. 2+ pedal pulses. No carotid bruits.  Abdomen: no  tenderness, no masses palpated. No hepatosplenomegaly. Bowel sounds positive.  Musculoskeletal: Good range of motion, no joint swelling or tenderness, Skin: no rashes, lesions, ulcers. No induration Neurologic: CN 2-12 grossly intact. Sensation intact, DTR normal. Strength 5/5 in all 4.  Psychiatric: Normal judgment and insight. Alert and oriented x 3. Normal mood  Data Reviewed:  Temperature 98.2, blood pressure 168/125, pulse  91 respiratory of 28 and oxygen sat 99% on room air.  Potassium 3.3 creatinine 1.18 acute viral screen negative urinalysis essentially negative.  Glucose is 159.  CT angiogram of the chest showed no evidence of PE.  Some mild cardiomegaly.  Mild groundglass opacity in the central lungs and upper lobes related to possible edema versus.  Stable ascending aortic aneurysm 4 cm  Assessment and Plan:  #1 acute CHF exacerbation: Patient has chronic lower extremity edema.  With fluid overload, exertional dyspnea, PND and orthopnea, will will admit the patient.  IV diuretics.  Get echocardiogram.  Avoid NSAIDs due to AKI.  Depending on the results of the echocardiogram we will proceed with major treatment.  Lasix will be IV at the moment.  Patient may require SGPT prior to discharge.  #2 hypokalemia: Replete potassium.  Might get worse due to diuretics.  #3 essential hypertension: We will confirm on resume home regimen.  #4 GERD: Continue with PPIs  #5 coronary artery disease: Compensated.  Resume home regimen  #6 hyperlipidemia: Continue statin  #7 esophageal stricture: No dysphagia at this point.  Continue regular diet    Advance Care Planning:   Code Status: Full Code   Consults: None but may consider cardiology consult if echocardiogram is abnormal  Family Communication: Husband  Severity of Illness: The appropriate patient status for this patient is INPATIENT. Inpatient status is judged to be reasonable and necessary in order to provide the required intensity of  service to ensure the patient's safety. The patient's presenting symptoms, physical exam findings, and initial radiographic and laboratory data in the context of their chronic comorbidities is felt to place them at high risk for further clinical deterioration. Furthermore, it is not anticipated that the patient will be medically stable for discharge from the hospital within 2 midnights of admission.   * I certify that at the point of admission it is my clinical judgment that the patient will require inpatient hospital care spanning beyond 2 midnights from the point of admission due to high intensity of service, high risk for further deterioration and high frequency of surveillance required.*  AuthorLonia Blood, MD 11/02/2023 10:34 PM  For on call review [email or web address].

## 2023-11-02 NOTE — Telephone Encounter (Signed)
FYI. Pt going to ED.  

## 2023-11-03 ENCOUNTER — Inpatient Hospital Stay (HOSPITAL_COMMUNITY)

## 2023-11-03 DIAGNOSIS — E876 Hypokalemia: Secondary | ICD-10-CM

## 2023-11-03 DIAGNOSIS — E7849 Other hyperlipidemia: Secondary | ICD-10-CM

## 2023-11-03 DIAGNOSIS — E039 Hypothyroidism, unspecified: Secondary | ICD-10-CM

## 2023-11-03 DIAGNOSIS — I10 Essential (primary) hypertension: Secondary | ICD-10-CM | POA: Diagnosis not present

## 2023-11-03 DIAGNOSIS — I5033 Acute on chronic diastolic (congestive) heart failure: Secondary | ICD-10-CM

## 2023-11-03 DIAGNOSIS — I2510 Atherosclerotic heart disease of native coronary artery without angina pectoris: Secondary | ICD-10-CM | POA: Diagnosis not present

## 2023-11-03 DIAGNOSIS — E669 Obesity, unspecified: Secondary | ICD-10-CM

## 2023-11-03 LAB — ECHOCARDIOGRAM COMPLETE
Area-P 1/2: 4.21 cm2
Calc EF: 76.2 %
Height: 62 in
S' Lateral: 2.3 cm
Single Plane A2C EF: 80.5 %
Single Plane A4C EF: 74.2 %
Weight: 2769.6 [oz_av]

## 2023-11-03 LAB — CBC
HCT: 45.2 % (ref 36.0–46.0)
Hemoglobin: 15.2 g/dL — ABNORMAL HIGH (ref 12.0–15.0)
MCH: 31.8 pg (ref 26.0–34.0)
MCHC: 33.6 g/dL (ref 30.0–36.0)
MCV: 94.6 fL (ref 80.0–100.0)
Platelets: 245 10*3/uL (ref 150–400)
RBC: 4.78 MIL/uL (ref 3.87–5.11)
RDW: 13 % (ref 11.5–15.5)
WBC: 5.8 10*3/uL (ref 4.0–10.5)
nRBC: 0 % (ref 0.0–0.2)

## 2023-11-03 LAB — CREATININE, SERUM
Creatinine, Ser: 1.12 mg/dL — ABNORMAL HIGH (ref 0.44–1.00)
GFR, Estimated: 48 mL/min — ABNORMAL LOW (ref >60)

## 2023-11-03 LAB — BASIC METABOLIC PANEL
Anion gap: 10 (ref 5–15)
BUN: 10 mg/dL (ref 8–23)
CO2: 23 mmol/L (ref 22–32)
Calcium: 8.7 mg/dL — ABNORMAL LOW (ref 8.9–10.3)
Chloride: 105 mmol/L (ref 98–111)
Creatinine, Ser: 1.19 mg/dL — ABNORMAL HIGH (ref 0.44–1.00)
GFR, Estimated: 44 mL/min — ABNORMAL LOW (ref >60)
Glucose, Bld: 162 mg/dL — ABNORMAL HIGH (ref 70–99)
Potassium: 3.8 mmol/L (ref 3.5–5.1)
Sodium: 138 mmol/L (ref 135–145)

## 2023-11-03 MED ORDER — POTASSIUM CHLORIDE CRYS ER 20 MEQ PO TBCR
40.0000 meq | EXTENDED_RELEASE_TABLET | Freq: Once | ORAL | Status: AC
  Administered 2023-11-03: 40 meq via ORAL
  Filled 2023-11-03: qty 2

## 2023-11-03 MED ORDER — LEVOTHYROXINE SODIUM 50 MCG PO TABS
50.0000 ug | ORAL_TABLET | Freq: Every day | ORAL | Status: DC
  Administered 2023-11-04: 50 ug via ORAL
  Filled 2023-11-03: qty 1

## 2023-11-03 MED ORDER — PREGABALIN 25 MG PO CAPS
50.0000 mg | ORAL_CAPSULE | Freq: Every day | ORAL | Status: DC
Start: 2023-11-03 — End: 2023-11-04
  Administered 2023-11-03: 50 mg via ORAL
  Filled 2023-11-03: qty 2

## 2023-11-03 MED ORDER — ROSUVASTATIN CALCIUM 20 MG PO TABS
40.0000 mg | ORAL_TABLET | Freq: Every day | ORAL | Status: DC
  Administered 2023-11-03: 40 mg via ORAL
  Filled 2023-11-03: qty 2

## 2023-11-03 MED ORDER — EMPAGLIFLOZIN 10 MG PO TABS
10.0000 mg | ORAL_TABLET | Freq: Every day | ORAL | Status: DC
  Administered 2023-11-03 – 2023-11-04 (×2): 10 mg via ORAL
  Filled 2023-11-03 (×2): qty 1

## 2023-11-03 MED ORDER — CLOPIDOGREL BISULFATE 75 MG PO TABS
75.0000 mg | ORAL_TABLET | Freq: Every day | ORAL | Status: DC
Start: 2023-11-03 — End: 2023-11-04
  Administered 2023-11-03 – 2023-11-04 (×2): 75 mg via ORAL
  Filled 2023-11-03 (×2): qty 1

## 2023-11-03 MED ORDER — ASPIRIN 81 MG PO CHEW
81.0000 mg | CHEWABLE_TABLET | Freq: Every day | ORAL | Status: DC
  Administered 2023-11-03 – 2023-11-04 (×2): 81 mg via ORAL
  Filled 2023-11-03 (×2): qty 1

## 2023-11-03 MED ORDER — MAGNESIUM OXIDE -MG SUPPLEMENT 400 (240 MG) MG PO TABS
400.0000 mg | ORAL_TABLET | Freq: Every day | ORAL | Status: DC
  Administered 2023-11-03 – 2023-11-04 (×2): 400 mg via ORAL
  Filled 2023-11-03 (×2): qty 1

## 2023-11-03 MED ORDER — CHOLECALCIFEROL 10 MCG (400 UNIT) PO TABS
400.0000 [IU] | ORAL_TABLET | Freq: Every day | ORAL | Status: DC
  Administered 2023-11-03 – 2023-11-04 (×2): 400 [IU] via ORAL
  Filled 2023-11-03 (×2): qty 1

## 2023-11-03 MED ORDER — PANTOPRAZOLE SODIUM 40 MG PO TBEC
40.0000 mg | DELAYED_RELEASE_TABLET | Freq: Every day | ORAL | Status: DC
Start: 2023-11-04 — End: 2023-11-04
  Administered 2023-11-04: 40 mg via ORAL
  Filled 2023-11-03: qty 1

## 2023-11-03 MED ORDER — TRAMADOL HCL 50 MG PO TABS
50.0000 mg | ORAL_TABLET | Freq: Two times a day (BID) | ORAL | Status: DC | PRN
  Administered 2023-11-03 (×2): 50 mg via ORAL
  Filled 2023-11-03 (×2): qty 1

## 2023-11-03 MED ORDER — EZETIMIBE 10 MG PO TABS
10.0000 mg | ORAL_TABLET | Freq: Every day | ORAL | Status: DC
  Administered 2023-11-03 – 2023-11-04 (×2): 10 mg via ORAL
  Filled 2023-11-03 (×2): qty 1

## 2023-11-03 MED ORDER — FUROSEMIDE 10 MG/ML IJ SOLN
20.0000 mg | Freq: Once | INTRAMUSCULAR | Status: AC
  Administered 2023-11-03: 20 mg via INTRAVENOUS
  Filled 2023-11-03: qty 2

## 2023-11-03 MED ORDER — LOSARTAN POTASSIUM 50 MG PO TABS
50.0000 mg | ORAL_TABLET | Freq: Every day | ORAL | Status: DC
  Administered 2023-11-03: 50 mg via ORAL
  Filled 2023-11-03: qty 1

## 2023-11-03 MED ORDER — FUROSEMIDE 10 MG/ML IJ SOLN
60.0000 mg | Freq: Two times a day (BID) | INTRAMUSCULAR | Status: DC
  Administered 2023-11-03: 60 mg via INTRAVENOUS
  Filled 2023-11-03 (×2): qty 6

## 2023-11-03 NOTE — Assessment & Plan Note (Signed)
 Calculated BMI is 31,6 consistent with class 1 obesity.

## 2023-11-03 NOTE — Progress Notes (Addendum)
 Progress Note   Patient: Kathleen Thompson XLK:[id number] DOB: 1934-03-18 DOA: 11/02/2023     1 DOS: the patient was seen and examined on 11/03/2023   Brief hospital course: Kathleen Thompson was admitted to the hospital with the working diagnosis of heart failure exacerbation.   89 yo female with the past medical history of hypertension,TIA, coronary artery disease, hypothyroid, GERD, hyperlipidemia and chronic lower extremity edema who presented with dyspnea and chest pain. She reported worsening lower extremity edema and 20 lbs weight gain. Over the last 2 days prior to admission noted worsening dyspnea on exertion. On the day of admission she experienced mid sternal chest pain prompting her to come to the ED. On her initial physical examination her blood pressure was 132/77, HR 77, RR 22 and 02 saturation 99%, lungs with bilateral rales, heart with S1 and S2 present and regular, abdomen with no distention and positive lower extremity edema +++  Na 139, K 3,4 cl 194 bicarbonate 22, glucose 159, bun 10 cr 1,18  AST 27 ALT 17 BNP 98.3  High sensitive troponin 5 and 6  Lactic acid 2.1  Wbc 7.1 hgb 14,9 plt 241 Sars covid 19 negative Influenza negative   Chest radiograph with hyperinflation with no cardiomegaly, effusions or infiltrates.   EKG 85 bpm, normal axis, normal intervals, qtc 449, sinus rhythm with no significant ST segment or T wave changes.   Assessment and Plan: * Acute on chronic diastolic CHF (congestive heart failure) (HCC) Echocardiogram with preserved LV systolic function with EF 70 to 75%, RV systolic function preserved, atrium with normal size, no pericardial effusion, no significant valvular disease.   Patient continue with volume overload.  (Note she has chronic lymphedema)  Systolic blood pressure 130 mmHg range.   Plan to continue diuresis with furosemide IV, will increase dose to 60 mg IV bid.  Resume losartan, stop on isosorbide for now.  Add SGLT 2 inh and if renal  functions table, plan to add spironolactone during this hospitalization.   HTN (hypertension) Continue blood pressure control with losartan, continue diuresis with furosemide.  Blood pressure monitoring.   CAD (coronary artery disease) Cardiac catheterization in 2014 with PTCA and stenting, not clear which artery was intervened.  Patient has been on aspirin and clopidogrel, along with statin therapy.  Currently with no chest pain.   Hypokalemia Renal function with serum cr at 1,19 with K at 3,8 and serum bicarbonate at 23  Na 138   Continue diuresis with furosemide, follow up renal function and electrolytes.  Avoid nephrotoxic medications and hypotension.   Hypothyroidism Continue with levothyroxine.   GERD Continue with pantoprazole.  Noted difficulty swallowing, plan for speech and swallow evaluation.   Hyperlipemia Continue statin therapy and ezetimibe.   Obesity (BMI 30-39.9) Calculated BMI is 31,6 consistent with class 1 obesity.    Subjective: patient continue to have lower extremity edema, and dyspnea, improved but not back to baseline, today she is seating in the chair at the side of the bed.   Physical Exam: Vitals:   11/02/23 2245 11/02/23 2300 11/02/23 2315 11/03/23 0112  BP: 134/72 126/71 139/79 (!) 143/70  Pulse: 77 84 92 77  Resp: 15 20 20 20   Temp:    97.8 F (36.6 C)  TempSrc:    Oral  SpO2: 100% 100% 100% 97%  Weight:    78.5 kg  Height:    5\' 2"  (1.575 m)   Neurology awake and alert ENT with mild pallor Cardiovascular with S1 and  S2 present and regular with no gallops, rubs or murmurs Respiratory with mild rales at bases, with no wheezing or rhonchi Abdomen with no distention  Positive lower extremity edema +++, with pitting over lymphedema.   Data Reviewed:    Family Communication: no family at the bedside   Disposition: Status is: Inpatient Remains inpatient appropriate because: IV diuresis   Planned Discharge Destination:  Home     Author: Coralie Keens, MD 11/03/2023 7:36 AM  For on call review [email or web address].

## 2023-11-03 NOTE — Progress Notes (Signed)
 Pt complaining of generalized pain, states that she typically takes Tramadol twice a day. Pt's K+ was 3.4 on labs previously drawn with no replacement. Attempted to notify Howerter, MD. Awaiting further orders.  Bari Edward, RN

## 2023-11-03 NOTE — Hospital Course (Addendum)
 Kathleen Thompson was admitted to the hospital with the working diagnosis of heart failure exacerbation.   89 yo female with the past medical history of hypertension,TIA, coronary artery disease, hypothyroid, GERD, hyperlipidemia and chronic lower extremity edema who presented with dyspnea and chest pain. She reported worsening lower extremity edema and 20 lbs weight gain. Over the last 2 days prior to admission noted worsening dyspnea on exertion. On the day of admission she experienced mid sternal chest pain prompting her to come to the ED. On her initial physical examination her blood pressure was 132/77, HR 77, RR 22 and 02 saturation 99%, lungs with bilateral rales, heart with S1 and S2 present and regular, abdomen with no distention and positive lower extremity edema +++  Na 139, K 3,4 cl 194 bicarbonate 22, glucose 159, bun 10 cr 1,18  AST 27 ALT 17 BNP 98.3  High sensitive troponin 5 and 6  Lactic acid 2.1  Wbc 7.1 hgb 14,9 plt 241 Sars covid 19 negative Influenza negative   Chest radiograph with hyperinflation with no cardiomegaly, effusions or infiltrates.   EKG 85 bpm, normal axis, normal intervals, qtc 449, sinus rhythm with no significant ST segment or T wave changes.

## 2023-11-03 NOTE — Assessment & Plan Note (Signed)
 Renal function with serum cr at 1,19 with K at 3,8 and serum bicarbonate at 23  Na 138   Continue diuresis with furosemide, follow up renal function and electrolytes.  Avoid nephrotoxic medications and hypotension.

## 2023-11-03 NOTE — Assessment & Plan Note (Signed)
 Continue with levothyroxine

## 2023-11-03 NOTE — Progress Notes (Signed)
 SLP Cancellation Note  Patient Details Name: KAYCI BELLEVILLE MRN: [id number] DOB: 05-01-1934   Cancelled treatment:       Reason Eval/Treat Not Completed: Patient at procedure or test/unavailable (ECHO). SLP will f/u.    Gwynneth Aliment, M.A., CF-SLP Speech Language Pathology, Acute Rehabilitation Services  Secure Chat preferred [phone]  11/03/2023, 11:45 AM

## 2023-11-03 NOTE — Progress Notes (Signed)
 PT Cancellation Note  Patient Details Name: Kathleen Thompson MRN: [id number] DOB: 22-Jul-1934   Cancelled Treatment:    Reason Eval/Treat Not Completed: (P) PT screened, no needs identified, will sign off Per OT pt is independent and has no PT needs. PT signing off.  Bernise Sylvain B. Beverely Risen PT, DPT Acute Rehabilitation Services Please use secure chat or  Call Office [phone]    Elon Alas [facility] 11/03/2023, 3:32 PM

## 2023-11-03 NOTE — Progress Notes (Signed)
  Echocardiogram 2D Echocardiogram has been performed.  Janalyn Harder 11/03/2023, 12:04 PM

## 2023-11-03 NOTE — Assessment & Plan Note (Addendum)
 Echocardiogram with preserved LV systolic function with EF 70 to 75%, RV systolic function preserved, atrium with normal size, no pericardial effusion, no significant valvular disease.   Patient continue with volume overload.  (Note she has chronic lymphedema)  Systolic blood pressure 130 mmHg range.   Plan to continue diuresis with furosemide IV, will increase dose to 60 mg IV bid.  Resume losartan, stop on isosorbide for now.  Add SGLT 2 inh and if renal functions table, plan to add spironolactone during this hospitalization.

## 2023-11-03 NOTE — Progress Notes (Signed)
 TRH night cross cover note:   I was notified by RN of the patient's request for resumption of her home as needed tramadol.  Additionally, patient's RN conveyed that most recent potassium level was 3.4, and that the patient is undergoing IV diuresis with Lasix.  I subsequently resumed the patient's home as needed tramadol, and ordered potassium chloride 40 mEq p.o. x 1 dose now.     Newton Pigg, DO Hospitalist

## 2023-11-03 NOTE — Assessment & Plan Note (Signed)
Continue statin therapy and ezetimibe.

## 2023-11-03 NOTE — Evaluation (Signed)
 Clinical/Bedside Swallow Evaluation Patient Details  Name: Kathleen Thompson MRN: [id number] Date of Birth: 1933/12/01  Today's Date: 11/03/2023 Time: SLP Start Time (ACUTE ONLY): 1431 SLP Stop Time (ACUTE ONLY): 1445 SLP Time Calculation (min) (ACUTE ONLY): 14 min  Past Medical History:  Past Medical History:  Diagnosis Date   Adenomatous colon polyp 02/2003   Arthritis    Blood transfusion    Cataract    Diverticulosis    Esophageal stricture    Gastroparesis    GERD (gastroesophageal reflux disease)    Headache    Hyperlipidemia    Hypertension    Hypothyroidism    Internal hemorrhoid    Iron deficiency anemia    Osteoporosis    PONV (postoperative nausea and vomiting)    PVD (peripheral vascular disease) (HCC)    Stroke (HCC)    TIA   TIA (transient ischemic attack)    Past Surgical History:  Past Surgical History:  Procedure Laterality Date   ABDOMINAL HYSTERECTOMY     APPENDECTOMY     CERVICAL FUSION     EYE SURGERY Bilateral 08/2016   cataracts   LEFT HEART CATHETERIZATION WITH CORONARY ANGIOGRAM N/A 12/20/2012   Procedure: LEFT HEART CATHETERIZATION WITH CORONARY ANGIOGRAM;  Surgeon: Peter M Swaziland, MD;  Location: [facility] CATH LAB;  Service: Cardiovascular;  Laterality: N/A;   shoulder surgery      VIDEO ASSISTED THORACOSCOPY (VATS)/WEDGE RESECTION Right 03/14/2013   Procedure: VIDEO ASSISTED THORACOSCOPY (VATS)/WEDGE RESECTION;  Surgeon: Loreli Slot, MD;  Location: [facility] OR;  Service: Thoracic;  Laterality: Right;   HPI:  Kathleen Thompson is an 89 yo female presenting to ED 3/24 with worsening LE edema. CT Chest shows mild ground-glass opacities in the central lungs and upper lobes bilaterally, may be related to edema or infection. PMH includes essential HTN, GERD with esophageal stricture, history of TIAs, HLD, chronic lower extremity edema on Lasix, CAD    Assessment / Plan / Recommendation  Clinical Impression  Per RN, pt had increased difficulty swallowing  a large potassium pill with pt reporting a globus sensation requiring mulitple sequential sips of water to clear. No s/s of dysphagia or aspiration were observed with trials of thin liquid, puree, or solids. RN gave meds during session without pt noting any overt difficulty. Given pt's history of GERD and esophageal stricture, provided education regarding esophageal precautions with recommendations to take meds with puree as needed. Continue current diet of regular solids with thin liquids. SLP to s/o at this time. SLP Visit Diagnosis: Dysphagia, unspecified (R13.10)    Aspiration Risk  Mild aspiration risk    Diet Recommendation Regular;Thin liquid    Liquid Administration via: Cup;Straw Medication Administration: Whole meds with liquid Supervision: Patient able to self feed Compensations: Minimize environmental distractions;Slow rate;Small sips/bites Postural Changes: Seated upright at 90 degrees;Remain upright for at least 30 minutes after po intake    Other  Recommendations Oral Care Recommendations: Oral care BID    Recommendations for follow up therapy are one component of a multi-disciplinary discharge planning process, led by the attending physician.  Recommendations may be updated based on patient status, additional functional criteria and insurance authorization.  Follow up Recommendations No SLP follow up      Assistance Recommended at Discharge    Functional Status Assessment Patient has not had a recent decline in their functional status  Frequency and Duration            Prognosis Prognosis for improved oropharyngeal function: Good  Swallow Study   General HPI: Kathleen Thompson is an 89 yo female presenting to ED 3/24 with worsening LE edema. CT Chest shows mild ground-glass opacities in the central lungs and upper lobes bilaterally, may be related to edema or infection. PMH includes essential HTN, GERD with esophageal stricture, history of TIAs, HLD, chronic  lower extremity edema on Lasix, CAD Type of Study: Bedside Swallow Evaluation Previous Swallow Assessment: none in chart Diet Prior to this Study: Regular;Thin liquids (Level 0) Temperature Spikes Noted: No Respiratory Status: Room air History of Recent Intubation: No Behavior/Cognition: Alert;Cooperative;Pleasant mood Oral Cavity Assessment: Within Functional Limits Oral Care Completed by SLP: No Oral Cavity - Dentition: Adequate natural dentition Vision: Functional for self-feeding Self-Feeding Abilities: Able to feed self Patient Positioning: Upright in chair Baseline Vocal Quality: Normal Volitional Cough: Strong Volitional Swallow: Able to elicit    Oral/Motor/Sensory Function Overall Oral Motor/Sensory Function: Within functional limits   Ice Chips Ice chips: Not tested   Thin Liquid Thin Liquid: Within functional limits Presentation: Straw;Self Fed    Nectar Thick Nectar Thick Liquid: Not tested   Honey Thick Honey Thick Liquid: Not tested   Puree Puree: Within functional limits Presentation: Spoon;Self Fed   Solid     Solid: Within functional limits Presentation: Self Fed      Gwynneth Aliment, M.A., CF-SLP Speech Language Pathology, Acute Rehabilitation Services  Secure Chat preferred [phone]  11/03/2023,3:03 PM

## 2023-11-03 NOTE — Progress Notes (Signed)
 OT Cancellation Note  Patient Details Name: Kathleen Thompson MRN: [id number] DOB: May 21, 1934   Cancelled Treatment:    Reason Eval/Treat Not Completed: OT screened, no needs identified, will sign off (pt reports independent and has no change from baseline, no concerns with ADLs/mobility at this time. Will screen. Please reconsult if there is a change in pt status)  Carver Fila, OTD, OTR/L SecureChat Preferred Acute Rehab [phone]   Dalphine Handing 11/03/2023, 3:34 PM

## 2023-11-03 NOTE — Assessment & Plan Note (Addendum)
 Continue with pantoprazole.  Noted difficulty swallowing, plan for speech and swallow evaluation.

## 2023-11-03 NOTE — TOC Initial Note (Addendum)
 Transition of Care [facility]) - Initial/Assessment Note    Patient Details  Name: Kathleen Thompson MRN: [id number] Date of Birth: [birth year]  Transition of [facility]) CM/SW Contact:    Gala Lewandowsky, RN Phone Number: 11/03/2023, 4:28 PM  Clinical Narrative: Patient presented for shortness of breath and chest pain. PTA patient reports that she was independent from home with spouse. Patient does not use any DME and spouse drives her to appointments. Case Manager will continue to follow for transition of care needs as the patient progresses.               Expected Discharge Plan: Home/Self Care Barriers to Discharge: Continued Medical Work up   Patient Goals and CMS Choice Patient states their goals for this hospitalization and ongoing recovery are:: plan to return home once stable.  Expected Discharge Plan and Services   Discharge Planning Services: CM Consult Post Acute Care Choice: NA Living arrangements for the past 2 months: Single Family Home   Prior Living Arrangements/Services Living arrangements for the past 2 months: Single Family Home Lives with:: Spouse Patient language and need for interpreter reviewed:: Yes Do you feel safe going back to the place where you live?: Yes      Need for Family Participation in Patient Care: No (Comment) Care giver support system in place?: No (comment)   Criminal Activity/Legal Involvement Pertinent to Current Situation/Hospitalization: No - Comment as needed  Activities of Daily Living   ADL Screening (condition at time of admission) Independently performs ADLs?: Yes (appropriate for developmental age) Is the patient deaf or have difficulty hearing?: No Does the patient have difficulty seeing, even when wearing glasses/contacts?: No Does the patient have difficulty concentrating, remembering, or making decisions?: No  Permission Sought/Granted Permission sought to share information with : Family Supports, Case Manager    Emotional Assessment Appearance:: Appears stated age Attitude/Demeanor/Rapport: Engaged Affect (typically observed): Appropriate Orientation: : Oriented to Self, Oriented to Place, Oriented to  Time, Oriented to Situation Alcohol / Substance Use: Not Applicable Psych Involvement: No (comment)  Admission diagnosis:  Acute CHF (congestive heart failure) (HCC) [I50.9] Acute on chronic systolic congestive heart failure (HCC) [I50.23] Patient Active Problem List   Diagnosis Date Noted   Hypokalemia 11/03/2023   Obesity (BMI 30-39.9) 11/03/2023   Acute on chronic diastolic CHF (congestive heart failure) (HCC) 11/02/2023   Swelling of both lower extremities 04/18/2022   New onset headache 03/01/2020   History of fall 03/01/2020   Chronic migraine 03/01/2020   Diabetes (HCC) 01/05/2020   DJD (degenerative joint disease) 12/15/2018   S/P cervical spinal fusion 01/05/2018   Protrusion of cervical intervertebral disc 01/05/2018   Stenosing tenosynovitis of wrist 01/11/2017   Cervicalgia 01/11/2017   Trochanteric bursitis, left hip 12/02/2016   h/o TIA   11/18/2016   Osteoporosis 06/22/2016   PCP NOTES >>> 05/21/2015   Annual physical exam 12/12/2014   Granuloma RUL resected 03/14/13  12/19/2012   ESOPHAGEAL STRICTURE 11/26/2009   Family history of malignant neoplasm of gastrointestinal tract 09/17/2009   Cough variant asthma 09/11/2009   History of colonic polyps 12/25/2008   Dyspnea 03/17/2008   PALPITATIONS, RECURRENT 02/02/2008   ADENOMATOUS COLONIC POLYP 12/23/2007   GERD 12/23/2007   GASTROPARESIS 12/23/2007   Hypothyroidism 03/12/2007   Hyperlipemia 03/12/2007   HTN (hypertension) 03/12/2007   CAD (coronary artery disease) 03/12/2007   INTERNAL HEMORRHOIDS 06/01/2006   DIVERTICULOSIS, COLON 06/01/2006   PCP:  Wanda Plump, MD Pharmacy:   [facility]  [address] [place] - [address] [place] [address] Phone: [phone] Fax:  [phone]  Social Drivers of Health (SDOH) Social History: SDOH Screenings   Food Insecurity: No Food Insecurity (11/03/2023)  Housing: Low Risk  (11/03/2023)  Transportation Needs: No Transportation Needs (11/03/2023)  Utilities: Not At Risk (11/03/2023)  Alcohol Screen: Low Risk  (06/18/2023)  Depression (PHQ2-9): Low Risk  (07/20/2023)  Financial Resource Strain: Low Risk  (06/18/2023)  Physical Activity: Inactive (06/18/2023)  Social Connections: Moderately Integrated (11/03/2023)  Stress: No Stress Concern Present (06/18/2023)  Tobacco Use: Low Risk  (11/02/2023)  Health Literacy: Adequate Health Literacy (06/18/2023)   Readmission Risk Interventions     No data to display

## 2023-11-03 NOTE — Assessment & Plan Note (Signed)
 Continue blood pressure control with losartan, continue diuresis with furosemide.  Blood pressure monitoring.

## 2023-11-03 NOTE — Assessment & Plan Note (Signed)
 Cardiac catheterization in 2014 with PTCA and stenting, not clear which artery was intervened.  Patient has been on aspirin and clopidogrel, along with statin therapy.  Currently with no chest pain.

## 2023-11-04 DIAGNOSIS — I5033 Acute on chronic diastolic (congestive) heart failure: Secondary | ICD-10-CM | POA: Diagnosis not present

## 2023-11-04 LAB — BASIC METABOLIC PANEL
Anion gap: 13 (ref 5–15)
BUN: 17 mg/dL (ref 8–23)
CO2: 24 mmol/L (ref 22–32)
Calcium: 8.5 mg/dL — ABNORMAL LOW (ref 8.9–10.3)
Chloride: 102 mmol/L (ref 98–111)
Creatinine, Ser: 1.33 mg/dL — ABNORMAL HIGH (ref 0.44–1.00)
GFR, Estimated: 39 mL/min — ABNORMAL LOW (ref >60)
Glucose, Bld: 119 mg/dL — ABNORMAL HIGH (ref 70–99)
Potassium: 3.1 mmol/L — ABNORMAL LOW (ref 3.5–5.1)
Sodium: 139 mmol/L (ref 135–145)

## 2023-11-04 LAB — MAGNESIUM: Magnesium: 2.4 mg/dL (ref 1.7–2.4)

## 2023-11-04 MED ORDER — POTASSIUM CHLORIDE ER 10 MEQ PO TBCR: EXTENDED_RELEASE_TABLET | ORAL | Status: DC

## 2023-11-04 MED ORDER — FUROSEMIDE 40 MG PO TABS
40.0000 mg | ORAL_TABLET | Freq: Every day | ORAL | Status: DC
  Administered 2023-11-04: 40 mg via ORAL
  Filled 2023-11-04: qty 1

## 2023-11-04 MED ORDER — POTASSIUM CHLORIDE CRYS ER 20 MEQ PO TBCR: 40.0000 meq | EXTENDED_RELEASE_TABLET | Freq: Once | ORAL | Status: DC

## 2023-11-04 MED ORDER — POTASSIUM CHLORIDE CRYS ER 10 MEQ PO TBCR
40.0000 meq | EXTENDED_RELEASE_TABLET | Freq: Once | ORAL | Status: AC
  Administered 2023-11-04: 40 meq via ORAL
  Filled 2023-11-04: qty 4

## 2023-11-04 NOTE — Discharge Summary (Signed)
 [facility]  Physician Discharge Summary   Patient ID: Kathleen Thompson MRN: [id number] DOB/AGE: 1934-02-27 89 y.o.  Admit date: 11/02/2023 Discharge date: 11/04/2023    PCP: Wanda Plump, MD  DISCHARGE DIAGNOSES:    Acute on chronic diastolic CHF (congestive heart failure) (HCC)   HTN (hypertension)   CAD (coronary artery disease)   Hypokalemia   Hypothyroidism   GERD   Hyperlipemia   Obesity (BMI 30-39.9)   RECOMMENDATIONS FOR OUTPATIENT FOLLOW UP: Follow-up with PCP within 1 week for blood work to check renal panel and electrolytes   Home Health: None Equipment/Devices: None  CODE STATUS: Full code  DISCHARGE CONDITION: fair  Diet recommendation: Heart healthy  INITIAL HISTORY: 89 yo female with the past medical history of hypertension,TIA, coronary artery disease, hypothyroid, GERD, hyperlipidemia and chronic lower extremity edema who presented with dyspnea and chest pain. She reported worsening lower extremity edema and 20 lbs weight gain. Over the last 2 days prior to admission noted worsening dyspnea on exertion. On the day of admission she experienced mid sternal chest pain prompting her to come to the ED. On her initial physical examination her blood pressure was 132/77, HR 77, RR 22 and 02 saturation 99%, lungs with bilateral rales, heart with S1 and S2 present and regular, abdomen with no distention and positive lower extremity edema +++   Na 139, K 3,4 cl 194 bicarbonate 22, glucose 159, bun 10 cr 1,18  AST 27 ALT 17 BNP 98.3  High sensitive troponin 5 and 6  Lactic acid 2.1  Wbc 7.1 hgb 14,9 plt 241 Sars covid 19 negative Influenza negative    Chest radiograph with hyperinflation with no cardiomegaly, effusions or infiltrates.    EKG 85 bpm, normal axis, normal intervals, qtc 449, sinus rhythm with no significant ST segment or T wave changes.   HOSPITAL COURSE:   Acute on chronic diastolic CHF (congestive heart failure) (HCC) Echocardiogram  with preserved LV systolic function with EF 70 to 75%, RV systolic function preserved, atrium with normal size, no pericardial effusion, no significant valvular disease.  Patient was given furosemide with good response.  Can resume her usual dose of furosemide.  Slight increase in creatinine noted.  But she has had good urine output.  She was told to follow-up with PCP in 1 week to recheck renal panel and electrolytes.   HTN (hypertension)   CAD (coronary artery disease) Cardiac catheterization in 2014 with PTCA and stenting, not clear which artery was intervened.  Patient has been on aspirin and clopidogrel, along with statin therapy.    Hypokalemia Supplemented   Hypothyroidism Continue with levothyroxine.    GERD Continue with pantoprazole.  Noted difficulty swallowing, plan for speech and swallow evaluation.    Hyperlipemia Continue statin therapy and ezetimibe.    Obesity (BMI 30-39.9) Calculated BMI is 31,6 consistent with class 1 obesity.      Patient wants to go home.  She is noted to be stable.  Okay for discharge home today.  PERTINENT LABS:  The results of significant diagnostics from this hospitalization (including imaging, microbiology, ancillary and laboratory) are listed below for reference.    Microbiology: Recent Results (from the past 240 hours)  Resp panel by RT-PCR (RSV, Flu A&B, Covid) Anterior Nasal Swab     Status: None   Collection Time: 11/02/23  7:57 PM   Specimen: Anterior Nasal Swab  Result Value Ref Range Status   SARS Coronavirus 2 by RT PCR NEGATIVE NEGATIVE Final  Influenza A by PCR NEGATIVE NEGATIVE Final   Influenza B by PCR NEGATIVE NEGATIVE Final    Comment: (NOTE) The Xpert Xpress SARS-CoV-2/FLU/RSV plus assay is intended as an aid in the diagnosis of influenza from Nasopharyngeal swab specimens and should not be used as a sole basis for treatment. Nasal washings and aspirates are unacceptable for Xpert Xpress  SARS-CoV-2/FLU/RSV testing.  Fact Sheet for Patients: BloggerCourse.com  Fact Sheet for Healthcare Providers: SeriousBroker.it  This test is not yet approved or cleared by the [place] FDA and has been authorized for detection and/or diagnosis of SARS-CoV-2 by FDA under an Emergency Use Authorization (EUA). This EUA will remain in effect (meaning this test can be used) for the duration of the COVID-19 declaration under Section 564(b)(1) of the Act, 21 U.S.C. section 360bbb-3(b)(1), unless the authorization is terminated or revoked.     Resp Syncytial Virus by PCR NEGATIVE NEGATIVE Final    Comment: (NOTE) Fact Sheet for Patients: BloggerCourse.com  Fact Sheet for Healthcare Providers: SeriousBroker.it  This test is not yet approved or cleared by the [place] FDA and has been authorized for detection and/or diagnosis of SARS-CoV-2 by FDA under an Emergency Use Authorization (EUA). This EUA will remain in effect (meaning this test can be used) for the duration of the COVID-19 declaration under Section 564(b)(1) of the Act, 21 U.S.C. section 360bbb-3(b)(1), unless the authorization is terminated or revoked.  Performed at [facility], [address] [place], [place] [address]      Labs:   Basic Metabolic Panel: Recent Labs  Lab 11/02/23 1659 11/02/23 2300 11/03/23 0424 11/04/23 0517  NA 139  --  138 139  K 3.4*  --  3.8 3.1*  CL 104  --  105 102  CO2 22  --  23 24  GLUCOSE 159*  --  162* 119*  BUN 10  --  10 17  CREATININE 1.18* 1.12* 1.19* 1.33*  CALCIUM 10.0  --  8.7* 8.5*  MG  --   --   --  2.4   Liver Function Tests: Recent Labs  Lab 11/02/23 1659  AST 27  ALT 17  ALKPHOS 59  BILITOT 0.9  PROT 7.7  ALBUMIN 4.5    CBC: Recent Labs  Lab 11/02/23 1659 11/02/23 2300  WBC 7.1 5.8  NEUTROABS 4.5  --   HGB 14.9 15.2*  HCT 44.1 45.2   MCV 93.4 94.6  PLT 241 245   BNP: BNP (last 3 results) Recent Labs    11/02/23 1659  BNP 98.3      IMAGING STUDIES ECHOCARDIOGRAM COMPLETE Result Date: 11/03/2023    ECHOCARDIOGRAM REPORT   Patient Name:   CHELSEY REDONDO Date of Exam: 11/03/2023 Medical Rec #:  [id number]        Height:       62.0 in Accession #:    [id number]       Weight:       173.1 lb Date of Birth:  08-06-1934        BSA:          1.798 m Patient Age:    89 years         BP:           120/66 mmHg Patient Gender: F                HR:           87 bpm. Exam Location:  Inpatient Procedure: 2D Echo,  Cardiac Doppler and Color Doppler (Both Spectral and Color            Flow Doppler were utilized during procedure). Indications:    I50.40* Unspecified combined systolic (congestive) and diastolic                 (congestive) heart failure  History:        Patient has prior history of Echocardiogram examinations, most                 recent 02/26/2023. CHF, TIA, Signs/Symptoms:Shortness of Breath,                 Dyspnea and Edema; Risk Factors:Hypertension, Dyslipidemia and                 Diabetes.  Sonographer:    Sheralyn Boatman RDCS Referring Phys: [id number] [facility] Jerelyn Charles  Sonographer Comments: Technically difficult study due to poor echo windows. IMPRESSIONS  1. Left ventricular ejection fraction, by estimation, is 70 to 75%. The left ventricle has hyperdynamic function. The left ventricle has no regional wall motion abnormalities. Left ventricular diastolic parameters are consistent with Grade I diastolic dysfunction (impaired relaxation).  2. Right ventricular systolic function is normal. The right ventricular size is dilated. There is normal pulmonary artery systolic pressure.  3. The mitral valve is grossly normal. Trivial mitral valve regurgitation. No evidence of mitral stenosis. Moderate mitral annular calcification.  4. The aortic valve is tricuspid. Aortic valve regurgitation is not visualized. Aortic valve sclerosis is present,  with no evidence of aortic valve stenosis. Comparison(s): Left ventricular function is more vigorous. FINDINGS  Left Ventricle: No strain or 3D. Left ventricular ejection fraction, by estimation, is 70 to 75%. The left ventricle has hyperdynamic function. The left ventricle has no regional wall motion abnormalities. The left ventricular internal cavity size was small. There is no left ventricular hypertrophy. Left ventricular diastolic parameters are consistent with Grade I diastolic dysfunction (impaired relaxation). Right Ventricle: The right ventricular size is dilated. No increase in right ventricular wall thickness. Right ventricular systolic function is normal. There is normal pulmonary artery systolic pressure. The tricuspid regurgitant velocity is 2.32 m/s, and with an assumed right atrial pressure of 3 mmHg, the estimated right ventricular systolic pressure is 24.5 mmHg. Left Atrium: Left atrial size was normal in size. Right Atrium: Right atrial size was normal in size. Pericardium: There is no evidence of pericardial effusion. Presence of epicardial fat layer. Mitral Valve: The mitral valve is grossly normal. Moderate mitral annular calcification. Trivial mitral valve regurgitation. No evidence of mitral valve stenosis. Tricuspid Valve: The tricuspid valve is normal in structure. Tricuspid valve regurgitation is mild . No evidence of tricuspid stenosis. Aortic Valve: The aortic valve is tricuspid. There is mild aortic valve annular calcification. Aortic valve regurgitation is not visualized. Aortic valve sclerosis is present, with no evidence of aortic valve stenosis. Pulmonic Valve: The pulmonic valve was normal in structure. Pulmonic valve regurgitation is not visualized. No evidence of pulmonic stenosis. Aorta: The aortic root and ascending aorta are structurally normal, with no evidence of dilitation. IAS/Shunts: No atrial level shunt detected by color flow Doppler. Additional Comments: 3D was  performed not requiring image post processing on an independent workstation and was indeterminate.  LEFT VENTRICLE PLAX 2D LVIDd:         3.90 cm     Diastology LVIDs:         2.30 cm     LV e' medial:  5.33 cm/s LV PW:         0.90 cm     LV E/e' medial:  12.5 LV IVS:        0.80 cm     LV e' lateral:   5.22 cm/s LVOT diam:     2.00 cm     LV E/e' lateral: 12.8 LV SV:         58 LV SV Index:   33 LVOT Area:     3.14 cm  LV Volumes (MOD) LV vol d, MOD A2C: 55.9 ml LV vol d, MOD A4C: 40.7 ml LV vol s, MOD A2C: 10.9 ml LV vol s, MOD A4C: 10.5 ml LV SV MOD A2C:     45.0 ml LV SV MOD A4C:     40.7 ml LV SV MOD BP:      36.9 ml RIGHT VENTRICLE             IVC RV S prime:     16.90 cm/s  IVC diam: 1.00 cm TAPSE (M-mode): 1.5 cm LEFT ATRIUM           Index        RIGHT ATRIUM          Index LA diam:      2.90 cm 1.61 cm/m   RA Area:     6.53 cm LA Vol (A2C): 20.1 ml 11.18 ml/m  RA Volume:   11.00 ml 6.12 ml/m LA Vol (A4C): 8.2 ml  4.58 ml/m  AORTIC VALVE LVOT Vmax:   106.00 cm/s LVOT Vmean:  68.100 cm/s LVOT VTI:    0.186 m  AORTA Ao Root diam: 3.10 cm Ao Asc diam:  3.50 cm MITRAL VALVE                TRICUSPID VALVE MV Area (PHT): 4.21 cm     TR Peak grad:   21.5 mmHg MV Decel Time: 180 msec     TR Vmax:        232.00 cm/s MV E velocity: 66.80 cm/s MV A velocity: 102.00 cm/s  SHUNTS MV E/A ratio:  0.65         Systemic VTI:  0.19 m                             Systemic Diam: 2.00 cm Riley Lam MD Electronically signed by Riley Lam MD Signature Date/Time: 11/03/2023/1:06:44 PM    Final    CT Angio Chest PE W and/or Wo Contrast Result Date: 11/02/2023 CLINICAL DATA:  High probability for PE. EXAM: CT ANGIOGRAPHY CHEST WITH CONTRAST TECHNIQUE: Multidetector CT imaging of the chest was performed using the standard protocol during bolus administration of intravenous contrast. Multiplanar CT image reconstructions and MIPs were obtained to evaluate the vascular anatomy. RADIATION DOSE REDUCTION:  This exam was performed according to the departmental dose-optimization program which includes automated exposure control, adjustment of the mA and/or kV according to patient size and/or use of iterative reconstruction technique. CONTRAST:  75mL OMNIPAQUE IOHEXOL 350 MG/ML SOLN COMPARISON:  CT chest 12/19/2012 FINDINGS: Cardiovascular: There is aneurysmal dilatation of the ascending aorta measuring 4 cm, unchanged. There is adequate opacification of the pulmonary arteries to the segmental level. There is no evidence for pulmonary embolism. Heart is mildly enlarged. There is no pericardial effusion. There are atherosclerotic calcifications of the aorta. Mediastinum/Nodes: No enlarged mediastinal, hilar, or axillary lymph nodes. Thyroid gland, trachea, and esophagus demonstrate no significant  findings. There is a small hiatal hernia. Lungs/Pleura: There has been interval surgical removal of previously identified right upper lobe nodule. There is a small amount of atelectasis in the bilateral upper lobes inferiorly. There are mild ground-glass opacities in the central lungs and upper lobes bilaterally. There is no lung consolidation, pleural effusion or pneumothorax. Upper Abdomen: No acute abnormality. Musculoskeletal: No chest wall abnormality. No acute or significant osseous findings. Review of the MIP images confirms the above findings. IMPRESSION: 1. No evidence for pulmonary embolism. 2. Mild cardiomegaly. 3. Mild ground-glass opacities in the central lungs and upper lobes bilaterally may be related to edema or infection. 4. Stable aneurysmal dilatation of the ascending aorta measuring 4 cm. Recommend follow-up CT/MR every 12 months and vascular consultation. 5. There has been interval surgical removal of previously identified right upper lobe nodule. Aortic Atherosclerosis (ICD10-I70.0). Electronically Signed   By: Darliss Cheney M.D.   On: 11/02/2023 22:08   DG Chest 1 View Result Date: 11/02/2023 CLINICAL  DATA:  Shortness of breath for 2 days, initial encounter EXAM: PORTABLE CHEST 1 VIEW COMPARISON:  02/12/2022 FINDINGS: Cardiac shadow is within normal limits. Aortic calcifications are seen. Postoperative changes are noted in the right upper lobe. The lungs are well aerated bilaterally. No focal infiltrate or effusion is seen. No bony abnormality is noted. IMPRESSION: No active disease. Electronically Signed   By: Alcide Clever M.D.   On: 11/02/2023 19:32    DISCHARGE EXAMINATION: Vitals:   11/03/23 0747 11/03/23 2045 11/04/23 0400 11/04/23 0720  BP: 120/66 (!) 124/57 (!) 111/48 (!) 120/59  Pulse: 73 75 73 74  Resp: 16 16 16 18   Temp: 98.1 F (36.7 C) 98.7 F (37.1 C)  98.2 F (36.8 C)  TempSrc: Oral Oral  Oral  SpO2:  98% 97%   Weight:   77.6 kg   Height:       General appearance: Awake alert.  In no distress Resp: Clear to auscultation bilaterally.  Normal effort Cardio: S1-S2 is normal regular.  No S3-S4.  No rubs murmurs or bruit GI: Abdomen is soft.  Nontender nondistended.  Bowel sounds are present normal.  No masses organomegaly Extremities: No edema.  Full range of motion of lower extremities. Neurologic: Alert and oriented x3.  No focal neurological deficits.    DISPOSITION: Home  Discharge Instructions     (HEART FAILURE PATIENTS) Call MD:  Anytime you have any of the following symptoms: 1) 3 pound weight gain in 24 hours or 5 pounds in 1 week 2) shortness of breath, with or without a dry hacking cough 3) swelling in the hands, feet or stomach 4) if you have to sleep on extra pillows at night in order to breathe.   Complete by: As directed    Call MD for:  difficulty breathing, headache or visual disturbances   Complete by: As directed    Call MD for:  extreme fatigue   Complete by: As directed    Call MD for:  persistant dizziness or light-headedness   Complete by: As directed    Call MD for:  persistant nausea and vomiting   Complete by: As directed    Call MD for:   severe uncontrolled pain   Complete by: As directed    Call MD for:  temperature >100.4   Complete by: As directed    Diet - low sodium heart healthy   Complete by: As directed    Discharge instructions   Complete by: As directed  Please take your medications as prescribed.  Please follow-up with your primary care provider early next week to do blood work to check your kidney function and your potassium levels.  Seek attention if your symptoms recur.  You were cared for by a hospitalist during your hospital stay. If you have any questions about your discharge medications or the care you received while you were in the hospital after you are discharged, you can call the unit and asked to speak with the hospitalist on call if the hospitalist that took care of you is not available. Once you are discharged, your primary care physician will handle any further medical issues. Please note that NO REFILLS for any discharge medications will be authorized once you are discharged, as it is imperative that you return to your primary care physician (or establish a relationship with a primary care physician if you do not have one) for your aftercare needs so that they can reassess your need for medications and monitor your lab values. If you do not have a primary care physician, you can call [phone] for a physician referral.   Increase activity slowly   Complete by: As directed          Allergies as of 11/04/2023       Reactions   Clindamycin/lincomycin Rash   Cymbalta [duloxetine Hcl]    Jittery, nervous, could not sleep   Gabapentin Other (See Comments)   Causes bad headaches   Hydrocodone Hives   Rofecoxib    hives   Latex Rash   Pt states that latex causes blisters to skin        Medication List     STOP taking these medications    losartan 50 MG tablet Commonly known as: COZAAR       TAKE these medications    aspirin 81 MG tablet Take 81 mg by mouth daily.   CALCIUM  PO Take 1 tablet by mouth 2 (two) times daily.   clopidogrel 75 MG tablet Commonly known as: PLAVIX Take 1 tablet (75 mg total) by mouth daily.   CO Q-10 PO Take 1 tablet by mouth daily after supper.   ezetimibe 10 MG tablet Commonly known as: Zetia Take 1 tablet (10 mg total) by mouth daily.   furosemide 40 MG tablet Commonly known as: LASIX Take 1 tablet (40 mg total) by mouth daily.   isosorbide mononitrate 30 MG 24 hr tablet Commonly known as: IMDUR Take 1 tablet by mouth once daily   levothyroxine 50 MCG tablet Commonly known as: SYNTHROID Take 1 tablet (50 mcg total) by mouth daily before breakfast.   Magnesium 400 MG Tabs Take 400 mg by mouth daily.   pantoprazole 40 MG tablet Commonly known as: PROTONIX Take 1 tablet (40 mg total) by mouth daily before breakfast.   potassium chloride 10 MEQ tablet Commonly known as: KLOR-CON Take 40 meq's (4 tablets) tonight and then resume 1 tablet of 10 meq each from tomorrow as before. What changed:  how much to take how to take this when to take this additional instructions   pregabalin 25 MG capsule Commonly known as: LYRICA Take 1 capsule (25 mg total) by mouth 2 (two) times daily. What changed:  how much to take when to take this   rosuvastatin 40 MG tablet Commonly known as: CRESTOR Take 1 tablet (40 mg total) by mouth at bedtime.   traMADol 50 MG tablet Commonly known as: ULTRAM Take 1 tablet (50 mg total) by mouth  every 12 (twelve) hours as needed.   vitamin D (CHOLECALCIFEROL) 400 units tablet Take 400 Units by mouth daily.          Follow-up Information     Wanda Plump, MD Follow up on 11/09/2023.   Specialty: Internal Medicine Why: Please see your primary care provider early next week for blood work to check renal function. Contact information: [address] [place] [address] [phone]                 TOTAL DISCHARGE TIME: 35 minutes  Yosgart Pavey  Rito Ehrlich  [facility] Pager on [email or web address]  11/05/2023, 10:33 AM

## 2023-11-04 NOTE — Progress Notes (Signed)
 Heart Failure Navigator Progress Note  Assessed for Heart & Vascular TOC clinic readiness.  Patient does not meet criteria due to EF 70-75%, will follow up with [facility] office after discharge. .   Navigator will sign off at this time.   Rhae Hammock, BSN, Scientist, clinical (histocompatibility and immunogenetics) Only

## 2023-11-05 ENCOUNTER — Other Ambulatory Visit: Payer: Self-pay | Admitting: Internal Medicine

## 2023-11-05 ENCOUNTER — Telehealth: Payer: Self-pay

## 2023-11-05 NOTE — Transitions of Care (Post Inpatient/ED Visit) (Signed)
 11/05/2023  Name: Kathleen Thompson MRN: [id number] DOB: 17-Nov-1933  Today's TOC FU Call Status: Today's TOC FU Call Status:: Successful TOC FU Call Completed TOC FU Call Complete Date: 11/05/23 Patient's Name and Date of Birth confirmed.  Transition Care Management Follow-up Telephone Call Date of Discharge: 11/04/23 Discharge Facility: [facility]) Type of Discharge: Inpatient Admission Primary Inpatient Discharge Diagnosis:: Acute on chronic diastolic CHF (congestive heart failure) How have you been since you were released from the hospital?: Better (Patient states she is "100% better") Any questions or concerns?: No (patient denies)  Items Reviewed: Did you receive and understand the discharge instructions provided?: Yes Medications obtained,verified, and reconciled?: Yes (Medications Reviewed) Any new allergies since your discharge?: No Dietary orders reviewed?: Yes Type of Diet Ordered:: Low sodium heart healthy Do you have support at home?: Yes People in Home: spouse, child(ren), adult Name of Support/Comfort Primary Source: husband and son help as needed  Medications Reviewed Today: Medications Reviewed Today     Reviewed by Jessy Oto, RN (Registered Nurse) on 11/05/23 at 1237  Med List Status: <None>   Medication Order Taking? Sig Documenting Provider Last Dose Status Informant  aspirin 81 MG tablet [id number] Yes Take 81 mg by mouth daily. [provider] Taking Active Self, Pharmacy Records  CALCIUM PO [id number] Yes Take 1 tablet by mouth 2 (two) times daily. [provider] Taking Active Self, Pharmacy Records  clopidogrel (PLAVIX) 75 MG tablet [id number] Yes Take 1 tablet (75 mg total) by mouth daily. Wanda Plump, MD Taking Active Self, Pharmacy Records  Coenzyme Q10 (CO Q-10 PO) [id number] Yes Take 1 tablet by mouth daily after supper. [provider] Taking Active Self, Pharmacy Records  denosumab [facility]) injection 60 mg [id number]    Wanda Plump, MD  Active   ezetimibe (ZETIA) 10 MG tablet [id number] Yes Take 1 tablet (10 mg total) by mouth daily. Wanda Plump, MD Taking Active Self, Pharmacy Records  furosemide (LASIX) 40 MG tablet [id number] Yes Take 1 tablet (40 mg total) by mouth daily. Georgeanna Lea, MD Taking Active Self, Pharmacy Records  isosorbide mononitrate (IMDUR) 30 MG 24 hr tablet [id number] Yes Take 1 tablet by mouth once daily Wanda Plump, MD Taking Active Self, Pharmacy Records  levothyroxine (SYNTHROID) 50 MCG tablet [id number] Yes Take 1 tablet (50 mcg total) by mouth daily before breakfast. Wanda Plump, MD Taking Active Self, Pharmacy Records  Magnesium 400 MG TABS [id number]  Take 400 mg by mouth daily. [provider]  Active Self, Pharmacy Records  pantoprazole (PROTONIX) 40 MG tablet [id number] Yes Take 1 tablet (40 mg total) by mouth daily before breakfast. Wanda Plump, MD Taking Active Self, Pharmacy Records  potassium chloride (KLOR-CON) 10 MEQ tablet [id number] Yes Take 40 meq's (4 tablets) tonight and then resume 1 tablet of 10 meq each from tomorrow as before. Osvaldo Shipper, MD Taking Active   pregabalin (LYRICA) 25 MG capsule [id number] Yes Take 1 capsule (25 mg total) by mouth 2 (two) times daily.  Patient taking differently: Take 50 mg by mouth at bedtime.   Wanda Plump, MD Taking Active Self, Pharmacy Records  rosuvastatin (CRESTOR) 40 MG tablet [id number] Yes Take 1 tablet (40 mg total) by mouth at bedtime. Wanda Plump, MD Taking Active Self, Pharmacy Records  traMADol Janean Sark) 50 MG tablet [id number] Yes Take 1 tablet (50 mg total) by mouth every 12 (twelve) hours as needed. Wanda Plump, MD Taking Active Self, Pharmacy  Records  vitamin D, CHOLECALCIFEROL, 400 UNITS tablet [id number] Yes Take 400 Units by mouth daily. [provider] Taking Active Self, Pharmacy Records  Med List Note Conrad [place], [place] 07/14/14 0900): Pt requests 90 day supply on all medications.              Home Care and Equipment/Supplies: Were Home Health Services Ordered?: No Any new equipment or medical supplies ordered?: No  Functional Questionnaire: Do you need assistance with bathing/showering or dressing?: No Do you need assistance with meal preparation?: No Do you need assistance with eating?: No Do you have difficulty maintaining continence: No Do you need assistance with getting out of bed/getting out of a chair/moving?: No Do you have difficulty managing or taking your medications?: No  Follow up appointments reviewed: PCP Follow-up appointment confirmed?: No (TOC RN offered to schedule- patient states she will call) MD Provider Line Number:[phone] Given: No Specialist Hospital Follow-up appointment confirmed?: NA Do you need transportation to your follow-up appointment?: No (husband drives) Do you understand care options if your condition(s) worsen?: Yes-patient verbalized understanding  SDOH Interventions Today    Flowsheet Row Most Recent Value  SDOH Interventions   Food Insecurity Interventions Intervention Not Indicated  Housing Interventions Intervention Not Indicated  Transportation Interventions Intervention Not Indicated  Utilities Interventions Intervention Not Indicated      Interventions Today    Flowsheet Row Most Recent Value  Chronic Disease   Chronic disease during today's visit Congestive Heart Failure (CHF)  General Interventions   General Interventions Discussed/Reviewed General Interventions Discussed, Labs  Labs Kidney Function  Nutrition Interventions   Nutrition Discussed/Reviewed Nutrition Discussed, Decreasing salt  Pharmacy Interventions   Pharmacy Dicussed/Reviewed Medications and their functions          Patient declined enrollment in [facility] program and was provided phone number for primary [facility] RN, Ricke Hey who patient states she will call with any questions or concerns.     Hilbert Odor RN, CCM [place]   [facility] RN Care Manager [phone]

## 2023-11-10 ENCOUNTER — Encounter: Payer: Self-pay | Admitting: Internal Medicine

## 2023-11-10 ENCOUNTER — Ambulatory Visit: Admitting: Internal Medicine

## 2023-11-10 VITALS — BP 132/80 | HR 99 | Temp 98.1°F | Resp 16 | Ht 62.0 in | Wt 172.0 lb

## 2023-11-10 DIAGNOSIS — I5033 Acute on chronic diastolic (congestive) heart failure: Secondary | ICD-10-CM

## 2023-11-10 DIAGNOSIS — E1142 Type 2 diabetes mellitus with diabetic polyneuropathy: Secondary | ICD-10-CM | POA: Diagnosis not present

## 2023-11-10 DIAGNOSIS — E039 Hypothyroidism, unspecified: Secondary | ICD-10-CM

## 2023-11-10 DIAGNOSIS — R269 Unspecified abnormalities of gait and mobility: Secondary | ICD-10-CM

## 2023-11-10 NOTE — Progress Notes (Unsigned)
 Subjective:    Patient ID: Kathleen Thompson, female    DOB: [birth year], 89 y.o.   MRN: [id number]  DOS:  11/10/2023 Type of visit - description: Hospital follow-up  Admitted to hospital 11/02/2023, discharge 3/26  Presented to the emergency room with SOB, edema, weight gain and chest pain. Acute on chronic diastolic CHF: Was found to be volume overloaded, good response to Lasix, creatinine slightly increased, due to be rechecked. Echocardiogram: EF 70 to 75%.  Grade 1 diastolic dysfunction.  Since she left the hospital, reports good med compliance. Feeling well. Shortness of breath has decreased. No chest pain. No cough or quizzing. Ambulatory BPs okay. Lower extremity edema slightly improved   Review of Systems See above   Past Medical History:  Diagnosis Date   Adenomatous colon polyp 02/2003   Arthritis    Blood transfusion    Cataract    Diverticulosis    Esophageal stricture    Gastroparesis    GERD (gastroesophageal reflux disease)    Headache    Hyperlipidemia    Hypertension    Hypothyroidism    Internal hemorrhoid    Iron deficiency anemia    Osteoporosis    PONV (postoperative nausea and vomiting)    PVD (peripheral vascular disease) (HCC)    Stroke (HCC)    TIA   TIA (transient ischemic attack)     Past Surgical History:  Procedure Laterality Date   ABDOMINAL HYSTERECTOMY     APPENDECTOMY     CERVICAL FUSION     EYE SURGERY Bilateral 08/2016   cataracts   LEFT HEART CATHETERIZATION WITH CORONARY ANGIOGRAM N/A 12/20/2012   Procedure: LEFT HEART CATHETERIZATION WITH CORONARY ANGIOGRAM;  Surgeon: Peter M Swaziland, MD;  Location: [facility] CATH LAB;  Service: Cardiovascular;  Laterality: N/A;   shoulder surgery      VIDEO ASSISTED THORACOSCOPY (VATS)/WEDGE RESECTION Right 03/14/2013   Procedure: VIDEO ASSISTED THORACOSCOPY (VATS)/WEDGE RESECTION;  Surgeon: Loreli Slot, MD;  Location: [facility] OR;  Service: Thoracic;  Laterality: Right;    Current Outpatient  Medications  Medication Instructions   aspirin 81 mg, Daily   CALCIUM PO 1 tablet, 2 times daily   clopidogrel (PLAVIX) 75 mg, Oral, Daily   Coenzyme Q10 (CO Q-10 PO) 1 tablet, Daily after supper   ezetimibe (ZETIA) 10 mg, Oral, Daily   furosemide (LASIX) 40 mg, Oral, Daily   isosorbide mononitrate (IMDUR) 30 mg, Oral, Daily   levothyroxine (SYNTHROID) 50 mcg, Oral, Daily before breakfast   Magnesium 400 mg, Daily   pantoprazole (PROTONIX) 40 mg, Oral, Daily before breakfast   potassium chloride (KLOR-CON) 10 MEQ tablet Take 40 meq's (4 tablets) tonight and then resume 1 tablet of 10 meq each from tomorrow as before.   pregabalin (LYRICA) 25 mg, Oral, 2 times daily   rosuvastatin (CRESTOR) 40 mg, Oral, Daily at bedtime   traMADol (ULTRAM) 50 mg, Oral, Every 12 hours PRN   vitamin D (CHOLECALCIFEROL) 400 Units, Daily       Objective:   Physical Exam BP 132/80   Pulse 99   Temp 98.1 F (36.7 C) (Oral)   Resp 16   Ht 5\' 2"  (1.575 m)   Wt 172 lb (78 kg)   SpO2 97%   BMI 31.46 kg/m  General:   Well developed, NAD, BMI noted. HEENT:  Normocephalic . Face symmetric, atraumatic Lungs:  CTA B Normal respiratory effort, no intercostal retractions, no accessory muscle use. Heart: RRR,  no murmur.  Lower extremities: +/+++ pretibial  and peri-ankle edema bilaterally  Skin: Not pale. Not jaundice Neurologic:  alert & oriented X3.  Speech normal, gait appropriate for age and unassisted Psych--  Cognition and judgment appear intact.  Cooperative with normal attention span and concentration.  Behavior appropriate. No anxious or depressed appearing.       Assessment DM: DX 05-2019, A1c 6.6. Neuropathy:  Previous labs WNL, intolerant to gabapentin, Cymbalta.  Lyrica helps  HTN Hypothyroidism Hyperlipidemia CV: --CAD cath 12/2012 single-vessel CAD,rx CV RF control, Rx Plavix, imdur --TIA remotely , apparently amaurosis fugax, was a started on on plavix per PCP, no further sx  since. --On ASA and Plavix: Discuss 11/17/2016 Osteoporosis -  DEXAs @ [facility], T score -3.9 (12/2015).Prolia #1:  07/2016 MS[facility]: Dr Ophelia Charter --DJD- s/p neck and shoulder surgeries ; + DJD cervical spine --knee pain: on tramadol rx per pcp GI: GED, gastroparesis, diverticulosis, esophageal stricture H/o Iron deficiency anemia H/o granuloma RUL, resected 03-14-13  PLAN BMP CBC A1c TSH  Acute on chronic diastolic heart failure. Presented to the hospital with volume overload, doing better now. Weight at home immediately after hospital discharge was 170 pounds, today is 167 pounds.  Ambulatory BPs around 130/80. Current meds: Lasix, Imdur, potassium Check a BMP and CBC. We had a long discussion about fluid restriction and low-salt diet.  Continue daily weight. Recommend to call cardiology and set up a follow-up DM: No ambulatory CBGs, diet controlled, check A1c. Hypothyroidism, check TSH. Gait disorder: Parking permit signed today. RTC 2 months.

## 2023-11-10 NOTE — Patient Instructions (Addendum)
 Fluid restriction: No more than 6 cups of fluids a day. Avoid excessive salt take.  If you are gaining weight, need to reduce your fluid intake even more.    Call your cardiologist, set up a follow-up appointment   Check the  blood pressure regularly Blood pressure goal:  between 110/65 and  135/85. If it is consistently higher or lower, let me know     GO TO THE LAB : Get the blood work     Please go to the front desk: Arrange for a follow-up in 2 months   Fluid Restriction Fluid restriction is when you need to limit how much fluid you take in each day. This may be because of a health condition. The amount you can have, also called your fluid allowance, may depend on: How well your kidneys are working. How much fluid your body is holding on to. Your blood pressure. How well your heart is working. How much salt, or sodium, is in your blood. It's important to keep close track of how much fluid you take in each day. What is my plan? Your health care provider recommends that you limit how much fluid you have to __________ per day. What counts toward my fluid intake? Your fluid intake is all the liquids you drink and any foods you eat that are liquid at room temperature. This includes: Tea, coffee, soda, lemonade, milk, water, juice, sports drinks, and supplement drinks. Alcohol. Cream. Gravy. Ice cubes. Soup and broth. Foods that become liquid if not kept cold, such as: Ice cream and ice milk. Frozen yogurt and sherbet. Frozen ice pops. Flavored gelatin. How do I keep track of my fluid intake? Each morning, fill a jug with the amount of water that matches your fluid allowance. Each time you take in any fluid, pour an equal amount of water out of the jug. This helps you see how much fluid you are taking in and how much more fluid you can have. These conversions can help you keep track of your fluid intake: 1 cup equals 8 oz (240 mL).  cup equals 6 oz (180 mL). ? cup  equals 5? oz (160 mL).  cup equals 4 oz (120 mL). ? cup equals 2? oz (80 mL).  cup equals 2 oz (60 mL). 2 Tbsp equals 1 oz (30 mL). What are tips for following this plan? Weigh yourself each day Weigh yourself every day. Wear the same amount of clothing each time you weigh yourself. Follow these steps in order each morning: Pee. Weigh yourself. Eat breakfast. Write down your weight each day. Give this weight record to your provider. If your weight is going up, your body may be holding on to too much fluid. Each 1 lb (0.45 kg) of weight you gain is a sign that your body is holding on to 2 cups (480 mL) of fluid. Manage your thirst To keep yourself from getting too thirsty: Add lemon juice or a slice of fresh lemon to water or ice. Freeze fruit juice or water in an ice cube tray. Before you freeze the juice or water, measure how much liquid you use to fill a cube section of the ice tray. Use this as part of your fluid allowance. Stay away from salty foods. Keep the temperature in your home at a cooler level. Keep the air in your home as humid as possible. Dry air can make you thirsty. Avoid being out in the hot sun. To help avoid dry mouth: Brush your  teeth often. Rinse out your mouth with mouthwash or use breath spray. Suck on lemon wedges or hard sour candies. Chew gum. General instructions Only have as much fluid as you're told. Measure and keep track of all fluids you drink or eat. Use small cups and glasses. Learn to sip fluids slowly. Try eating frozen fruits, such as grapes or strawberries. These can help you feel less thirsty but won't add to your fluid intake. Swallow your pills with soft foods, such as applesauce or mashed potatoes, instead of with liquids. Doing this helps you save your fluid allowance for things you enjoy. What are some signs that I may be taking in too much fluid? You may be taking in too much fluid if: Your weight goes up. Contact your provider if you  gain weight quickly. Your face, hands, legs, feet, and abdomen start to swell. You have trouble breathing. This information is not intended to replace advice given to you by your health care provider. Make sure you discuss any questions you have with your health care provider. Document Revised: 09/30/2022 Document Reviewed: 09/30/2022 Elsevier Patient Education  2024 Elsevier Inc.     Low-Sodium Eating Plan Salt (sodium) helps you keep a healthy balance of fluids in your body. Too much sodium can raise your blood pressure. It can also cause fluid and waste to be held in your body. Your health care provider or dietitian may recommend a low-sodium eating plan if you have high blood pressure (hypertension), kidney disease, liver disease, or heart failure. Eating less sodium can help lower your blood pressure and reduce swelling. It can also protect your heart, liver, and kidneys. What are tips for following this plan? Reading food labels  Check food labels for the amount of sodium per serving. If you eat more than one serving, you must multiply the listed amount by the number of servings. Choose foods with less than 140 milligrams (mg) of sodium per serving. Avoid foods with 300 mg of sodium or more per serving. Always check how much sodium is in a product, even if the label says "unsalted" or "no salt added." Shopping  Buy products labeled as "low-sodium" or "no salt added." Buy fresh foods. Avoid canned foods and pre-made or frozen meals. Avoid canned, cured, or processed meats. Buy breads that have less than 80 mg of sodium per slice. Cooking  Eat more home-cooked food. Try to eat less restaurant, buffet, and fast food. Try not to add salt when you cook. Use salt-free seasonings or herbs instead of table salt or sea salt. Check with your provider or pharmacist before using salt substitutes. Cook with plant-based oils, such as canola, sunflower, or olive oil. Meal planning When  eating at a restaurant, ask if your food can be made with less salt or no salt. Avoid dishes labeled as brined, pickled, cured, or smoked. Avoid dishes made with soy sauce, miso, or teriyaki sauce. Avoid foods that have monosodium glutamate (MSG) in them. MSG may be added to some restaurant food, sauces, soups, bouillon, and canned foods. Make meals that can be grilled, baked, poached, roasted, or steamed. These are often made with less sodium. General information Try to limit your sodium intake to 1,500-2,300 mg each day, or the amount told by your provider. What foods should I eat? Fruits Fresh, frozen, or canned fruit. Fruit juice. Vegetables Fresh or frozen vegetables. "No salt added" canned vegetables. "No salt added" tomato sauce and paste. Low-sodium or reduced-sodium tomato and vegetable juice. Grains Low-sodium  cereals, such as oats, puffed wheat and rice, and shredded wheat. Low-sodium crackers. Unsalted rice. Unsalted pasta. Low-sodium bread. Whole grain breads and whole grain pasta. Meats and other proteins Fresh or frozen meat, poultry, seafood, and fish. These should have no added salt. Low-sodium canned tuna and salmon. Unsalted nuts. Dried peas, beans, and lentils without added salt. Unsalted canned beans. Eggs. Unsalted nut butters. Dairy Milk. Soy milk. Cheese that is naturally low in sodium, such as ricotta cheese, fresh mozzarella, or Swiss cheese. Low-sodium or reduced-sodium cheese. Cream cheese. Yogurt. Seasonings and condiments Fresh and dried herbs and spices. Salt-free seasonings. Low-sodium mustard and ketchup. Sodium-free salad dressing. Sodium-free light mayonnaise. Fresh or refrigerated horseradish. Lemon juice. Vinegar. Other foods Homemade, reduced-sodium, or low-sodium soups. Unsalted popcorn and pretzels. Low-salt or salt-free chips. The items listed above may not be all the foods and drinks you can have. Talk to a dietitian to learn more. What foods should I  avoid? Vegetables Sauerkraut, pickled vegetables, and relishes. Olives. [place] fries. Onion rings. Regular canned vegetables, except low-sodium or reduced-sodium items. Regular canned tomato sauce and paste. Regular tomato and vegetable juice. Frozen vegetables in sauces. Grains Instant hot cereals. Bread stuffing, pancake, and biscuit mixes. Croutons. Seasoned rice or pasta mixes. Noodle soup cups. Boxed or frozen macaroni and cheese. Regular salted crackers. Self-rising flour. Meats and other proteins Meat or fish that is salted, canned, smoked, spiced, or pickled. Precooked or cured meat, such as sausages or meat loaves. Tomasa Blase. Ham. Pepperoni. Hot dogs. Corned beef. Chipped beef. Salt pork. Jerky. Pickled herring, anchovies, and sardines. Regular canned tuna. Salted nuts. Dairy Processed cheese and cheese spreads. Hard cheeses. Cheese curds. Blue cheese. Feta cheese. String cheese. Regular cottage cheese. Buttermilk. Canned milk. Fats and oils Salted butter. Regular margarine. Ghee. Bacon fat. Seasonings and condiments Onion salt, garlic salt, seasoned salt, table salt, and sea salt. Canned and packaged gravies. Worcestershire sauce. Tartar sauce. Barbecue sauce. Teriyaki sauce. Soy sauce, including reduced-sodium soy sauce. Steak sauce. Fish sauce. Oyster sauce. Cocktail sauce. Horseradish that you find on the shelf. Regular ketchup and mustard. Meat flavorings and tenderizers. Bouillon cubes. Hot sauce. Pre-made or packaged marinades. Pre-made or packaged taco seasonings. Relishes. Regular salad dressings. Salsa. Other foods Salted popcorn and pretzels. Corn chips and puffs. Potato and tortilla chips. Canned or dried soups. Pizza. Frozen entrees and pot pies. The items listed above may not be all the foods and drinks you should avoid. Talk to a dietitian to learn more. This information is not intended to replace advice given to you by your health care provider. Make sure you discuss any  questions you have with your health care provider. Document Revised: 08/14/2022 Document Reviewed: 08/14/2022 Elsevier Patient Education  2024 [employer].

## 2023-11-11 LAB — CBC WITH DIFFERENTIAL/PLATELET
Basophils Absolute: 0 10*3/uL (ref 0.0–0.1)
Basophils Relative: 0.7 % (ref 0.0–3.0)
Eosinophils Absolute: 0.1 10*3/uL (ref 0.0–0.7)
Eosinophils Relative: 1.2 % (ref 0.0–5.0)
HCT: 42.3 % (ref 36.0–46.0)
Hemoglobin: 14.5 g/dL (ref 12.0–15.0)
Lymphocytes Relative: 41.3 % (ref 12.0–46.0)
Lymphs Abs: 2.5 10*3/uL (ref 0.7–4.0)
MCHC: 34.2 g/dL (ref 30.0–36.0)
MCV: 94.9 fl (ref 78.0–100.0)
Monocytes Absolute: 0.4 10*3/uL (ref 0.1–1.0)
Monocytes Relative: 6.9 % (ref 3.0–12.0)
Neutro Abs: 3 10*3/uL (ref 1.4–7.7)
Neutrophils Relative %: 49.9 % (ref 43.0–77.0)
Platelets: 270 10*3/uL (ref 150.0–400.0)
RBC: 4.46 Mil/uL (ref 3.87–5.11)
RDW: 13.3 % (ref 11.5–15.5)
WBC: 6 10*3/uL (ref 4.0–10.5)

## 2023-11-11 LAB — BASIC METABOLIC PANEL WITH GFR
BUN: 10 mg/dL (ref 6–23)
CO2: 25 meq/L (ref 19–32)
Calcium: 9 mg/dL (ref 8.4–10.5)
Chloride: 95 meq/L — ABNORMAL LOW (ref 96–112)
Creatinine, Ser: 0.99 mg/dL (ref 0.40–1.20)
GFR: 51.21 mL/min — ABNORMAL LOW (ref >60.00)
Glucose, Bld: 114 mg/dL — ABNORMAL HIGH (ref 70–99)
Potassium: 3.8 meq/L (ref 3.5–5.1)
Sodium: 135 meq/L (ref 135–145)

## 2023-11-11 LAB — TSH: TSH: 3.47 u[IU]/mL (ref 0.35–5.50)

## 2023-11-11 LAB — HEMOGLOBIN A1C: Hgb A1c MFr Bld: 6.8 % — ABNORMAL HIGH (ref 4.6–6.5)

## 2023-11-11 NOTE — Assessment & Plan Note (Signed)
 TCM 7  acute on chronic diastolic heart failure. Presented to the hospital with volume overload, doing better now. Weight at home immediately after hospital discharge was 170 pounds, today is 167 pounds.  Ambulatory BPs around 130/80. Current meds: Lasix, Imdur, potassium Check a BMP and CBC. We had a long discussion about fluid restriction and low-salt diet.  Education material provided.  Continue daily weight. Recommend to call cardiology and set up a follow-up DM: No ambulatory CBGs, diet controlled, check A1c. Hypothyroidism, check TSH. Gait disorder: Parking permit signed today. RTC 2 months.

## 2023-11-12 MED ORDER — POTASSIUM CHLORIDE ER 10 MEQ PO TBCR: 10.0000 meq | EXTENDED_RELEASE_TABLET | Freq: Every day | ORAL | 1 refills | Status: DC

## 2023-11-12 NOTE — Addendum Note (Signed)
 Addended byConrad [place] D on: 11/12/2023 02:02 PM   Modules accepted: Orders

## 2023-12-13 ENCOUNTER — Telehealth: Payer: Self-pay | Admitting: Internal Medicine

## 2023-12-14 NOTE — Telephone Encounter (Signed)
PDMP okay, prescription sent 

## 2023-12-14 NOTE — Telephone Encounter (Signed)
 Requesting: tramadol  50mg   Contract: 08/07/22 UDS: 07/20/23 Last Visit: 11/10/23 Next Visit: 01/15/24 Last Refill: 10/28/23 #60 and 0RF   Please Advise

## 2023-12-16 ENCOUNTER — Ambulatory Visit: Payer: Medicare PPO | Admitting: Internal Medicine

## 2024-01-02 ENCOUNTER — Other Ambulatory Visit: Payer: Self-pay | Admitting: Internal Medicine

## 2024-01-05 ENCOUNTER — Telehealth: Payer: Self-pay | Admitting: Internal Medicine

## 2024-01-05 ENCOUNTER — Encounter: Payer: Self-pay | Admitting: *Deleted

## 2024-01-05 NOTE — Telephone Encounter (Signed)
 Pt is requesting her prolia  shot at her appt 6/10. Please verify if she is due.

## 2024-01-06 NOTE — Telephone Encounter (Signed)
 Pt due on or around 02/08/24.  Pt notified that we will call her closer to appointment.

## 2024-01-07 ENCOUNTER — Other Ambulatory Visit (HOSPITAL_COMMUNITY): Payer: Self-pay

## 2024-01-13 ENCOUNTER — Telehealth: Payer: Self-pay

## 2024-01-13 ENCOUNTER — Other Ambulatory Visit (HOSPITAL_COMMUNITY): Payer: Self-pay

## 2024-01-13 NOTE — Telephone Encounter (Signed)
 Pt ready for scheduling for PROLIA  on or after : 02/08/24  Option# 1: Buy/Bill (Office supplied medication)  Out-of-pocket cost due at time of clinic visit: $357  Number of injection/visits approved: 2  Primary: [employer] Prolia  co-insurance: 20% Admin fee co-insurance: 20%  Secondary: --- Prolia  co-insurance:  Admin fee co-insurance:   Medical Benefit Details: Date Benefits were checked: 01/08/24 Deductible: NO/ Coinsurance: 20%/ Admin Fee: 20%  Prior Auth: APPROVED PA# [id number] Expiration Date: 08/12/23-08/10/24  # of doses approved: 2 ----------------------------------------------------------------------- Option# 2- Med Obtained from pharmacy:  Pharmacy benefit: Copay $64 (Paid to pharmacy) Admin Fee: 20% approximately $25 (Pay at clinic)  Prior Auth: N/A PA# Expiration Date:   # of doses approved:   If patient wants fill through the pharmacy benefit please send prescription to: [employer], and include estimated need by date in rx notes. Pharmacy will ship medication directly to the office.  Patient NOT eligible for Prolia  Copay Card. Copay Card can make patient's cost as little as $25. Link to apply: [email or web address]://www.amgensupportplus.[email or web address]/copay  ** This summary of benefits is an estimation of the patient's out-of-pocket cost. Exact cost may very based on individual plan coverage.

## 2024-01-13 NOTE — Telephone Encounter (Signed)
   Following Medicare Advantage guideline: 20% coinsurance, 20% admin fee

## 2024-01-15 ENCOUNTER — Ambulatory Visit: Admitting: Internal Medicine

## 2024-01-18 ENCOUNTER — Other Ambulatory Visit: Payer: Self-pay | Admitting: *Deleted

## 2024-01-18 MED ORDER — DENOSUMAB 60 MG/ML ~~LOC~~ SOSY: 60.0000 mg | PREFILLED_SYRINGE | SUBCUTANEOUS | 0 refills | Status: DC

## 2024-01-19 ENCOUNTER — Ambulatory Visit: Admitting: Internal Medicine

## 2024-01-19 ENCOUNTER — Encounter: Payer: Self-pay | Admitting: Internal Medicine

## 2024-01-19 VITALS — BP 122/80 | HR 73 | Temp 97.9°F | Resp 16 | Ht 62.0 in | Wt 170.2 lb

## 2024-01-19 DIAGNOSIS — E7849 Other hyperlipidemia: Secondary | ICD-10-CM

## 2024-01-19 DIAGNOSIS — I5032 Chronic diastolic (congestive) heart failure: Secondary | ICD-10-CM | POA: Diagnosis not present

## 2024-01-19 NOTE — Patient Instructions (Addendum)
   Continue checking your weight daily.  If you gain more than 4 or 5 pounds reach out to cardiology or me.  Check the  blood pressure regularly Blood pressure goal:  between 110/65 and  135/85. If it is consistently higher or lower, let me know     GO TO THE LAB :  Get the blood work   Your results will be posted on MyChart with my comments  Next office visit for a physical exam in 3 to 4 months Please make an appointment before you leave today

## 2024-01-19 NOTE — Progress Notes (Unsigned)
 Subjective:    Patient ID: Kathleen Thompson, female    DOB: 12-18-1933, 89 y.o.   MRN: [id number]  DOS:  01/19/2024 Type of visit - description: Follow-up  Follow-up from previous visit. In general feels well.  She actually feels that she has a lot of energy. Denies chest pain or difficulty breathing. Lower extremity edema is controlled.   Review of Systems See above   Past Medical History:  Diagnosis Date   Adenomatous colon polyp 02/2003   Arthritis    Blood transfusion    Cataract    Diverticulosis    Esophageal stricture    Gastroparesis    GERD (gastroesophageal reflux disease)    Headache    Hyperlipidemia    Hypertension    Hypothyroidism    Internal hemorrhoid    Iron deficiency anemia    Osteoporosis    PONV (postoperative nausea and vomiting)    PVD (peripheral vascular disease) (HCC)    Stroke (HCC)    TIA   TIA (transient ischemic attack)     Past Surgical History:  Procedure Laterality Date   ABDOMINAL HYSTERECTOMY     APPENDECTOMY     CERVICAL FUSION     EYE SURGERY Bilateral 08/2016   cataracts   LEFT HEART CATHETERIZATION WITH CORONARY ANGIOGRAM N/A 12/20/2012   Procedure: LEFT HEART CATHETERIZATION WITH CORONARY ANGIOGRAM;  Surgeon: Peter M Swaziland, MD;  Location: [facility] CATH LAB;  Service: Cardiovascular;  Laterality: N/A;   shoulder surgery      VIDEO ASSISTED THORACOSCOPY (VATS)/WEDGE RESECTION Right 03/14/2013   Procedure: VIDEO ASSISTED THORACOSCOPY (VATS)/WEDGE RESECTION;  Surgeon: Zelphia Higashi, MD;  Location: [facility] OR;  Service: Thoracic;  Laterality: Right;    Current Outpatient Medications  Medication Instructions   aspirin  81 mg, Daily   CALCIUM  PO 1 tablet, 2 times daily   clopidogrel  (PLAVIX ) 75 mg, Oral, Daily   Coenzyme Q10 (CO Q-10 PO) 1 tablet, Daily after supper   denosumab  (PROLIA ) 60 mg, Subcutaneous, Every 6 months, Dx code: M81.0.  pt has appt on 02/22/24   ezetimibe  (ZETIA ) 10 mg, Oral, Daily   furosemide  (LASIX ) 40 mg,  Oral, Daily   isosorbide  mononitrate (IMDUR ) 30 mg, Oral, Daily   levothyroxine  (SYNTHROID ) 50 mcg, Oral, Daily before breakfast   Magnesium  400 mg, Daily   pantoprazole  (PROTONIX ) 40 mg, Oral, Daily before breakfast   potassium chloride  (KLOR-CON ) 10 MEQ tablet 10 mEq, Oral, Daily   pregabalin  (LYRICA ) 25 mg, Oral, 2 times daily   rosuvastatin  (CRESTOR ) 40 mg, Oral, Daily at bedtime   traMADol  (ULTRAM ) 50 mg, Oral, Every 12 hours PRN   vitamin D  (CHOLECALCIFEROL ) 400 Units, Daily       Objective:   Physical Exam BP 122/80   Pulse 73   Temp 97.9 F (36.6 C) (Oral)   Resp 16   Ht 5\' 2"  (1.575 m)   Wt 170 lb 4 oz (77.2 kg)   SpO2 93%   BMI 31.14 kg/m  General:   Well developed, NAD, BMI noted. HEENT:  Normocephalic . Face symmetric, atraumatic Lungs:  CTA B Normal respiratory effort, no intercostal retractions, no accessory muscle use. Heart: RRR,  no murmur.  Lower extremities: +/+++ pretibial edema bilaterally  Skin: Not pale. Not jaundice Neurologic:  alert & oriented X3.  Speech normal, gait appropriate for age and unassisted Psych--  Cognition and judgment appear intact.  Cooperative with normal attention span and concentration.  Behavior appropriate. No anxious or depressed appearing.  Assessment    Assessment DM: DX 05-2019, A1c 6.6. Neuropathy:  Previous labs WNL, intolerant to gabapentin , Cymbalta .  Lyrica  helps  HTN Hypothyroidism Hyperlipidemia CV: --CAD cath 12/2012 single-vessel CAD,rx CV RF control, Rx Plavix , imdur  --TIA remotely , apparently amaurosis fugax, was a started  on plavix  per PCP, no further sx since. --On ASA and Plavix : Discuss 11/17/2016 --CHF acute on chronic , admitted 10/2023 Osteoporosis -  DEXAs @ [facility], T score -3.9 (12/2015).Prolia  #1:  07/2016 MSK: Dr Murrel Arnt --DJD- s/p neck and shoulder surgeries ; + DJD cervical spine --knee pain: on tramadol  rx per pcp GI: GED, gastroparesis, diverticulosis, esophageal  stricture H/o Iron deficiency anemia H/o granuloma RUL, resected 03-14-13  PLAN CHF: Since the last visit, she is avoiding excessive fluids, her weight at home a couple days ago was 169 Lb, on target. Feels energetic, no chest pain no difficulty breathing. Cardiology follow-up scheduled for 03/17/2024 Plan: Continue Lasix , Imdur , potassium, recheck a BMP.  Continue monitoring weight. High cholesterol: On Zetia , Crestor , due for FLP.  Check in today. DJD: On tramadol  average twice daily. RTC CPX 3 to 4 months    ====4// TCM 7  acute on chronic diastolic heart failure. Presented to the hospital with volume overload, doing better now. Weight at home immediately after hospital discharge was 170 pounds, today is 167 pounds.  Ambulatory BPs around 130/80. Current meds: Lasix , Imdur , potassium Check a BMP and CBC. We had a long discussion about fluid restriction and low-salt diet.  Education material provided.  Continue daily weight. Recommend to call cardiology and set up a follow-up DM: No ambulatory CBGs, diet controlled, check A1c. Hypothyroidism, check TSH. Gait disorder: Parking permit signed today. RTC 2 months.

## 2024-01-20 LAB — BASIC METABOLIC PANEL WITH GFR
BUN/Creatinine Ratio: 11 (calc) (ref 6–22)
BUN: 12 mg/dL (ref 7–25)
CO2: 24 mmol/L (ref 20–32)
Calcium: 9.4 mg/dL (ref 8.6–10.4)
Chloride: 102 mmol/L (ref 98–110)
Creat: 1.06 mg/dL — ABNORMAL HIGH (ref 0.60–0.95)
Glucose, Bld: 106 mg/dL — ABNORMAL HIGH (ref 65–99)
Potassium: 4.3 mmol/L (ref 3.5–5.3)
Sodium: 141 mmol/L (ref 135–146)
eGFR: 51 mL/min/{1.73_m2} — ABNORMAL LOW (ref >=60)

## 2024-01-20 LAB — LIPID PANEL
Cholesterol: 137 mg/dL (ref <200)
HDL: 63 mg/dL (ref >=50)
LDL Cholesterol (Calc): 54 mg/dL
Non-HDL Cholesterol (Calc): 74 mg/dL (ref <130)
Total CHOL/HDL Ratio: 2.2 (calc) (ref <5.0)
Triglycerides: 116 mg/dL (ref <150)

## 2024-01-20 NOTE — Assessment & Plan Note (Signed)
 CHF: Since the last visit, she is avoiding excessive fluids, her weight at home a couple days ago was 169 Lb, on target. Feels energetic, no chest pain no difficulty breathing. Cardiology follow-up scheduled for 03/17/2024 Plan: Continue Lasix , Imdur , potassium, recheck a BMP.  Continue monitoring weight. High cholesterol: On Zetia , Crestor , due for FLP.  Check in today. DJD: On tramadol  average twice daily. RTC CPX 3 to 4 months

## 2024-01-22 ENCOUNTER — Ambulatory Visit: Payer: Self-pay | Admitting: Internal Medicine

## 2024-01-22 ENCOUNTER — Telehealth: Payer: Self-pay | Admitting: Internal Medicine

## 2024-01-22 NOTE — Telephone Encounter (Signed)
PDMP okay, prescription sent 

## 2024-01-22 NOTE — Telephone Encounter (Signed)
 Name of Medication: Tramadol  50mg  Name of Pharmacy: [facility] [address] [place], [place] - [address]  Last Fill or Written Date and Quantity: 12/14/23 60tab 0refills Last Office Visit and Type: 01/19/24 follow up on congestive heart failure Next Office Visit and Type: 02/22/24 for follow up  Last Controlled Substance Agreement Date: 01/05/2018 Last UDS: 07/20/23

## 2024-02-09 ENCOUNTER — Telehealth: Payer: Self-pay | Admitting: Internal Medicine

## 2024-02-09 NOTE — Telephone Encounter (Signed)
 Requesting: Lyrica  25mg   Contract: 08/07/22 UDS: 07/20/23 Last Visit: 01/19/24 Next Visit: 05/20/24 Last Refill: 07/20/23 #60 and 6RF   Please Advise

## 2024-02-09 NOTE — Telephone Encounter (Signed)
pdmp okay, Rx sent

## 2024-02-11 ENCOUNTER — Other Ambulatory Visit: Payer: Self-pay | Admitting: Internal Medicine

## 2024-02-17 NOTE — Progress Notes (Signed)
 [facility] Quality Team Note  Name: Kathleen Thompson Date of Birth: 1933/12/16 MRN: [id number] Date: 02/17/2024  [facility] Quality Team has reviewed this patient's chart, please see recommendations below:  [facility] Quality Other; (CHART REVIEWED FOR [facility] MEASURES. ABSTRACTED ALL 4 COMPONENTS.)

## 2024-02-19 ENCOUNTER — Other Ambulatory Visit: Payer: Self-pay

## 2024-02-22 ENCOUNTER — Ambulatory Visit (INDEPENDENT_AMBULATORY_CARE_PROVIDER_SITE_OTHER)

## 2024-02-22 DIAGNOSIS — M810 Age-related osteoporosis without current pathological fracture: Secondary | ICD-10-CM

## 2024-02-22 NOTE — Progress Notes (Signed)
 Pt here for Prolia . Shot given in left arm subcutaneous. Pt handled well. Shot provided by patient.

## 2024-02-24 ENCOUNTER — Telehealth: Payer: Self-pay | Admitting: Internal Medicine

## 2024-02-24 NOTE — Telephone Encounter (Signed)
 PDMP okay, Rx sent

## 2024-02-24 NOTE — Telephone Encounter (Signed)
 Requesting: tramadol  50mg   Contract:08/07/22 UDS:07/20/23 Last Visit: 01/19/24 Next Visit: 05/20/24 Last Refill: 01/22/24 #60 and 0RF   Please Advise

## 2024-02-26 ENCOUNTER — Other Ambulatory Visit: Payer: Self-pay | Admitting: Internal Medicine

## 2024-02-29 MED ORDER — DENOSUMAB 60 MG/ML ~~LOC~~ SOSY: 60.0000 mg | PREFILLED_SYRINGE | SUBCUTANEOUS | Status: AC

## 2024-02-29 NOTE — Addendum Note (Signed)
 Addended by: ESTELLE GILLIS D on: 02/29/2024 12:22 PM   Modules accepted: Orders

## 2024-03-17 ENCOUNTER — Ambulatory Visit: Attending: Cardiology | Admitting: Cardiology

## 2024-03-17 ENCOUNTER — Encounter: Payer: Self-pay | Admitting: Cardiology

## 2024-03-17 VITALS — BP 140/78 | HR 84 | Ht 62.0 in | Wt 168.0 lb

## 2024-03-17 DIAGNOSIS — I2510 Atherosclerotic heart disease of native coronary artery without angina pectoris: Secondary | ICD-10-CM | POA: Diagnosis not present

## 2024-03-17 DIAGNOSIS — E7849 Other hyperlipidemia: Secondary | ICD-10-CM

## 2024-03-17 DIAGNOSIS — I5032 Chronic diastolic (congestive) heart failure: Secondary | ICD-10-CM

## 2024-03-17 DIAGNOSIS — I5030 Unspecified diastolic (congestive) heart failure: Secondary | ICD-10-CM | POA: Insufficient documentation

## 2024-03-17 DIAGNOSIS — I10 Essential (primary) hypertension: Secondary | ICD-10-CM | POA: Diagnosis not present

## 2024-03-17 NOTE — Progress Notes (Signed)
 Cardiology Office Note:    Date:  03/17/2024   ID:  Kathleen Thompson, DOB 12/18/1933, MRN [id number]  PCP:  Amon Aloysius BRAVO, MD  Cardiologist:  Lamar Fitch, MD    Referring MD: Amon Aloysius BRAVO, MD   No chief complaint on file.   History of Present Illness:    Kathleen Thompson is a 89 y.o. female past medical history significant for coronary artery disease in 2014 she did have cardiac catheterization PTCA and stenting of arteries however I do not know exactly which artery has being addressed by that time.  Additional problems she did have TIA many years ago, diastolic congestive heart failure, essential hypertension, dyslipidemia, swelling of lower extremities. Comes today to months for follow-up overall she is doing great.  In March she was admitted to the hospital because of shortness of breath as well as swelling of lower extremities, she was diuresed nicely and doing well since that time.  She says she is feeling well she is very active she is very busy and doing well overall.  Past Medical History:  Diagnosis Date   Adenomatous colon polyp 02/2003   Arthritis    Blood transfusion    Cataract    Diverticulosis    Esophageal stricture    Gastroparesis    GERD (gastroesophageal reflux disease)    Headache    Hyperlipidemia    Hypertension    Hypothyroidism    Internal hemorrhoid    Iron deficiency anemia    Osteoporosis    PONV (postoperative nausea and vomiting)    PVD (peripheral vascular disease) (HCC)    Stroke (HCC)    TIA   TIA (transient ischemic attack)     Past Surgical History:  Procedure Laterality Date   ABDOMINAL HYSTERECTOMY     APPENDECTOMY     CERVICAL FUSION     EYE SURGERY Bilateral 08/2016   cataracts   LEFT HEART CATHETERIZATION WITH CORONARY ANGIOGRAM N/A 12/20/2012   Procedure: LEFT HEART CATHETERIZATION WITH CORONARY ANGIOGRAM;  Surgeon: Peter M Swaziland, MD;  Location: [facility] CATH LAB;  Service: Cardiovascular;  Laterality: N/A;   shoulder surgery       VIDEO ASSISTED THORACOSCOPY (VATS)/WEDGE RESECTION Right 03/14/2013   Procedure: VIDEO ASSISTED THORACOSCOPY (VATS)/WEDGE RESECTION;  Surgeon: Elspeth JAYSON Millers, MD;  Location: [facility] OR;  Service: Thoracic;  Laterality: Right;    Current Medications: Current Meds  Medication Sig   aspirin  81 MG tablet Take 81 mg by mouth daily.   CALCIUM  PO Take 1 tablet by mouth 2 (two) times daily.   clopidogrel  (PLAVIX ) 75 MG tablet Take 1 tablet (75 mg total) by mouth daily.   Coenzyme Q10 (CO Q-10 PO) Take 1 tablet by mouth daily after supper.   denosumab  (PROLIA ) 60 MG/ML SOSY injection Inject 60 mg into the skin every 6 (six) months. Dx code: M81.0.  pt has appt on 02/22/24   ezetimibe  (ZETIA ) 10 MG tablet Take 1 tablet (10 mg total) by mouth daily.   furosemide  (LASIX ) 40 MG tablet Take 1 tablet (40 mg total) by mouth daily.   isosorbide  mononitrate (IMDUR ) 30 MG 24 hr tablet Take 1 tablet (30 mg total) by mouth daily.   levothyroxine  (SYNTHROID ) 50 MCG tablet Take 1 tablet (50 mcg total) by mouth daily before breakfast.   Magnesium  400 MG TABS Take 400 mg by mouth daily.   pantoprazole  (PROTONIX ) 40 MG tablet Take 1 tablet (40 mg total) by mouth daily before breakfast.   potassium chloride  (KLOR-CON )  10 MEQ tablet Take 1 tablet (10 mEq total) by mouth daily.   pregabalin  (LYRICA ) 25 MG capsule Take 1 capsule by mouth twice daily   rosuvastatin  (CRESTOR ) 40 MG tablet Take 1 tablet (40 mg total) by mouth at bedtime.   traMADol  (ULTRAM ) 50 MG tablet TAKE 1 TABLET BY MOUTH EVERY 12 HOURS AS NEEDED FOR PAIN   traMADol  (ULTRAM ) 50 MG tablet Take 1 tablet (50 mg total) by mouth every 12 (twelve) hours as needed. for pain   vitamin D , CHOLECALCIFEROL , 400 UNITS tablet Take 400 Units by mouth daily.   Current Facility-Administered Medications for the 03/17/24 encounter (Office Visit) with Eran Windish J, MD  Medication   [START ON 08/19/2024] denosumab  (PROLIA ) injection 60 mg     Allergies:    Clindamycin/lincomycin, Cymbalta  [duloxetine  hcl], Gabapentin , Hydrocodone, Rofecoxib, and Latex   Social History   Socioeconomic History   Marital status: Married    Spouse name: Not on file   Number of children: 2   Years of education: 11th grade   Highest education level: Not on file  Occupational History   Occupation: Retired-[employer]     Employer: RETIRED  Tobacco Use   Smoking status: Never   Smokeless tobacco: Never  Substance and Sexual Activity   Alcohol use: No   Drug use: No   Sexual activity: Yes  Other Topics Concern   Not on file  Social History Narrative   Household-- pt and wife   Husband drives    2 boys , one in [place], on in [place]    No surviving sibling out of seven.   One cup coffee with 1/2 caffeine .   Right-handed.   Social Drivers of Corporate investment banker Strain: Low Risk  (06/18/2023)   Overall Financial Resource Strain (CARDIA)    Difficulty of Paying Living Expenses: Not hard at all  Food Insecurity: No Food Insecurity (11/05/2023)   Hunger Vital Sign    Worried About Running Out of Food in the Last Year: Never true    Ran Out of Food in the Last Year: Never true  Transportation Needs: No Transportation Needs (11/05/2023)   PRAPARE - Administrator, Civil Service (Medical): No    Lack of Transportation (Non-Medical): No  Physical Activity: Inactive (06/18/2023)   Exercise Vital Sign    Days of Exercise per Week: 0 days    Minutes of Exercise per Session: 0 min  Stress: No Stress Concern Present (06/18/2023)   [employer] of Occupational Health - Occupational Stress Questionnaire    Feeling of Stress : Not at all  Social Connections: Moderately Integrated (11/03/2023)   Social Connection and Isolation Panel    Frequency of Communication with Friends and Family: More than three times a week    Frequency of Social Gatherings with Friends and Family: More than three times a week    Attends Religious Services: Never    Loss adjuster, chartered or Organizations: No    Attends Engineer, structural: 1 to 4 times per year    Marital Status: Married     Family History: The patient's family history includes Asthma in her father; Colon cancer in her brother and sister; Diabetes in her maternal aunt, maternal uncle, and mother; Heart attack in her brother, brother, brother, brother, father, mother, sister, and sister. There is no history of Breast cancer. ROS:   Please see the history of present illness.    All 14 point review of  systems negative except as described per history of present illness  EKGs/Labs/Other Studies Reviewed:         Recent Labs: 11/02/2023: ALT 17; B Natriuretic Peptide 98.3 11/04/2023: Magnesium  2.4 11/10/2023: Hemoglobin 14.5; Platelets 270.0; TSH 3.47 01/19/2024: BUN 12; Creat 1.06; Potassium 4.3; Sodium 141  Recent Lipid Panel    Component Value Date/Time   CHOL 137 01/19/2024 1429   CHOL 203 (H) 04/21/2022 1306   TRIG 116 01/19/2024 1429   TRIG 132 06/10/2006 1139   HDL 63 01/19/2024 1429   HDL 78 04/21/2022 1306   CHOLHDL 2.2 01/19/2024 1429   VLDL 23.4 03/18/2023 1335   LDLCALC 54 01/19/2024 1429   LDLDIRECT 69 08/28/2023 1404   LDLDIRECT 79.0 12/10/2021 1445    Physical Exam:    VS:  BP (!) 140/78 (BP Location: Right Arm, Patient Position: Sitting, Cuff Size: Normal)   Pulse 84   Ht 5' 2 (1.575 m)   Wt 168 lb (76.2 kg)   SpO2 96%   BMI 30.73 kg/m     Wt Readings from Last 3 Encounters:  03/17/24 168 lb (76.2 kg)  01/19/24 170 lb 4 oz (77.2 kg)  11/10/23 172 lb (78 kg)     GEN:  Well nourished, well developed in no acute distress HEENT: Normal NECK: No JVD; No carotid bruits LYMPHATICS: No lymphadenopathy CARDIAC: RRR, no murmurs, no rubs, no gallops RESPIRATORY:  Clear to auscultation without rales, wheezing or rhonchi  ABDOMEN: Soft, non-tender, non-distended MUSCULOSKELETAL:  No edema; No deformity  SKIN: Warm and dry LOWER EXTREMITIES: no  swelling NEUROLOGIC:  Alert and oriented x 3 PSYCHIATRIC:  Normal affect   ASSESSMENT:    1. Coronary artery disease involving native coronary artery of native heart without angina pectoris   2. Primary hypertension   3. Other hyperlipidemia   4. Chronic diastolic congestive heart failure (HCC)    PLAN:    In order of problems listed above:  Coronary disease stable from that point review continue dual antiplatelet therapy. Essential hypertension blood pressure well-controlled continue present management. Dyslipidemia I did review K PN which show me her LDL 54 HDL 63 excellent control this is from 01/19/2024 which I review will continue present management. Chronic diastolic congestive heart failure looks compensated she check her weight on the regular basis which I advised to continue.   Medication Adjustments/Labs and Tests Ordered: Current medicines are reviewed at length with the patient today.  Concerns regarding medicines are outlined above.  No orders of the defined types were placed in this encounter.  Medication changes: No orders of the defined types were placed in this encounter.   Signed, Lamar DOROTHA Fitch, MD, [facility] 03/17/2024 2:31 PM    [facility]

## 2024-03-17 NOTE — Patient Instructions (Signed)

## 2024-04-05 ENCOUNTER — Other Ambulatory Visit: Payer: Self-pay | Admitting: Internal Medicine

## 2024-04-11 ENCOUNTER — Other Ambulatory Visit: Payer: Self-pay | Admitting: Internal Medicine

## 2024-04-12 NOTE — Telephone Encounter (Signed)
 Requesting: tramadol  50mg   Contract: 08/07/22 UDS: 07/20/23 Last Visit: 01/19/24 Next Visit: 05/20/24  Last Refill: 02/24/24 #60 and 0RF  Please Advise

## 2024-04-12 NOTE — Telephone Encounter (Signed)
 Dispensed 30-day supply of tramadol  03/30/2024, too early

## 2024-04-22 ENCOUNTER — Other Ambulatory Visit: Payer: Self-pay | Admitting: Internal Medicine

## 2024-04-22 NOTE — Telephone Encounter (Signed)
 Requesting: tramadol  50mg   Contract: 08/07/22 UDS: 07/20/23 Last Visit: 01/19/24 Next Visit: 05/20/24  Last Refill: 02/24/24 #60 and 0RF  Please Advise

## 2024-05-03 ENCOUNTER — Other Ambulatory Visit: Payer: Self-pay | Admitting: Internal Medicine

## 2024-05-20 ENCOUNTER — Other Ambulatory Visit: Payer: Self-pay

## 2024-05-20 ENCOUNTER — Encounter: Payer: Self-pay | Admitting: Internal Medicine

## 2024-05-20 ENCOUNTER — Ambulatory Visit: Admitting: Internal Medicine

## 2024-05-20 VITALS — BP 132/80 | HR 63 | Temp 98.4°F | Resp 16 | Ht 62.0 in | Wt 168.0 lb

## 2024-05-20 DIAGNOSIS — E1142 Type 2 diabetes mellitus with diabetic polyneuropathy: Secondary | ICD-10-CM

## 2024-05-20 DIAGNOSIS — Z0001 Encounter for general adult medical examination with abnormal findings: Secondary | ICD-10-CM

## 2024-05-20 DIAGNOSIS — Z79899 Other long term (current) drug therapy: Secondary | ICD-10-CM

## 2024-05-20 DIAGNOSIS — I10 Essential (primary) hypertension: Secondary | ICD-10-CM

## 2024-05-20 DIAGNOSIS — G629 Polyneuropathy, unspecified: Secondary | ICD-10-CM

## 2024-05-20 DIAGNOSIS — Z0000 Encounter for general adult medical examination without abnormal findings: Secondary | ICD-10-CM | POA: Diagnosis not present

## 2024-05-20 DIAGNOSIS — Z23 Encounter for immunization: Secondary | ICD-10-CM

## 2024-05-20 DIAGNOSIS — I5032 Chronic diastolic (congestive) heart failure: Secondary | ICD-10-CM

## 2024-05-20 DIAGNOSIS — E119 Type 2 diabetes mellitus without complications: Secondary | ICD-10-CM

## 2024-05-20 DIAGNOSIS — E039 Hypothyroidism, unspecified: Secondary | ICD-10-CM

## 2024-05-20 MED ORDER — TRAMADOL HCL 50 MG PO TABS: 50.0000 mg | ORAL_TABLET | Freq: Two times a day (BID) | ORAL | 0 refills | Status: DC | PRN

## 2024-05-20 NOTE — Patient Instructions (Addendum)
 GO TO THE LAB :  Get the blood work    Then, go to the front desk for the checkout Please make an appointment for a checkup in 4 to 5 months   You got a flu and pneumonia shot today. Please consider the following vaccines at your pharmacy RSV Tdap COVID booster if not done recently   Check the  blood pressure regularly Blood pressure goal:  between 110/65 and  135/85. If it is consistently higher or lower, let me know     Please read more detailed instructions below    CONGESTIVE HEART FAILURE WITH LOWER EXTREMITY EDEMA: Your heart condition is stable, and the leg swelling is being managed. -Continue taking Lasix , Imdur , and potassium. -Keep elevating your legs and using compression stockings.  CORONARY ARTERY DISEASE: Your coronary artery disease is stable with no recent chest pain. -Continue taking aspirin  and Plavix .  TYPE 2 DIABETES MELLITUS, DIET CONTROLLED: Your diabetes is controlled with your diet. -We will check your HbA1c levels.  HYPERTENSION: Your high blood pressure is managed with your current medications. -Continue with your current medications.  HYPOTHYROIDISM: Your thyroid  condition is managed with Synthroid . -We will check your thyroid  labs.  HYPERLIPIDEMIA: Your cholesterol levels are managed with your current medications. -Continue with your current medications.  OSTEOPOROSIS: Your osteoporosis is managed with Prolia  injections. -Continue with your Prolia  injections.  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 -We will refill your tramadol  prescription.

## 2024-05-20 NOTE — Progress Notes (Signed)
 Subjective:    Patient ID: Kathleen Thompson, female    DOB: [birth year], 89 y.o.   MRN: [id number]  DOS:  05/20/2024 CPX  Discussed the use of AI scribe software for clinical note transcription with the patient, who gave verbal consent to proceed.  History of Present Illness  Post-dental extraction status - Recent dental extractions performed due to infections - Currently taking antibiotics - No associated fever  Lower extremity edema - Occasional leg swelling - Managed with support hose and leg elevation - No chest pain or respiratory difficulties  Pain management - Takes tramadol  for pain - Using Tylenol  as an alternative, which causes nervousness  Medication adherence - Takes heart and cholesterol medications, Prolia , and thyroid  medication - Adherent to prescribed medications   Wt Readings from Last 3 Encounters:  05/20/24 168 lb (76.2 kg)  03/17/24 168 lb (76.2 kg)  01/19/24 170 lb 4 oz (77.2 kg)     Review of Systems  Other than above, a 14 point review of systems is negative       Past Medical History:  Diagnosis Date   Adenomatous colon polyp 02/2003   Arthritis    Blood transfusion    Cataract    Diverticulosis    Esophageal stricture    Gastroparesis    GERD (gastroesophageal reflux disease)    Headache    Hyperlipidemia    Hypertension    Hypothyroidism    Internal hemorrhoid    Iron deficiency anemia    Osteoporosis    PONV (postoperative nausea and vomiting)    PVD (peripheral vascular disease)    Stroke (HCC)    TIA   TIA (transient ischemic attack)     Past Surgical History:  Procedure Laterality Date   ABDOMINAL HYSTERECTOMY     APPENDECTOMY     CERVICAL FUSION     EYE SURGERY Bilateral 08/2016   cataracts   LEFT HEART CATHETERIZATION WITH CORONARY ANGIOGRAM N/A 12/20/2012   Procedure: LEFT HEART CATHETERIZATION WITH CORONARY ANGIOGRAM;  Surgeon: Peter M Swaz[place]and, MD;  Location: [facility] CATH LAB;  Service: Cardiovascular;   Laterality: N/A;   shoulder surgery      VIDEO ASSISTED THORACOSCOPY (VATS)/WEDGE RESECTION Right 03/14/2013   Procedure: VIDEO ASSISTED THORACOSCOPY (VATS)/WEDGE RESECTION;  Surgeon: Elspeth JAYSON Millers, MD;  Location: [facility] OR;  Service: Thoracic;  Laterality: Right;    Current Outpatient Medications  Medication Instructions   amoxicillin  (AMOXIL ) 500 mg, 3 times daily   aspirin  81 mg, Daily   CALCIUM  PO 1 tablet, 2 times daily   clopidogrel  (PLAVIX ) 75 mg, Oral, Daily   Coenzyme Q10 (CO Q-10 PO) 1 tablet, Daily after supper   denosumab  (PROLIA ) 60 mg, Subcutaneous, Every 6 months, Dx code: M81.0.  pt has appt on 02/22/24   ezetimibe  (ZETIA ) 10 mg, Oral, Daily   furosemide  (LASIX ) 40 mg, Oral, Daily   isosorbide  mononitrate (IMDUR ) 30 mg, Oral, Daily   levothyroxine  (SYNTHROID ) 50 mcg, Oral, Daily before breakfast   Magnesium  400 mg, Daily   pantoprazole  (PROTONIX ) 40 mg, Oral, Daily before breakfast   potassium chloride  (KLOR-CON ) 10 MEQ tablet 10 mEq, Oral, Daily   pregabalin  (LYRICA ) 25 mg, Oral, 2 times daily   rosuvastatin  (CRESTOR ) 40 mg, Oral, Daily at bedtime   traMADol  (ULTRAM ) 50 mg, Oral, Every 12 hours PRN, for pain   vitamin D  (CHOLECALCIFEROL ) 400 Units, Daily       Objective:   Physical Exam BP 132/80   Pulse 63   Temp 98.4  F (36.9 C) (Oral)   Resp 16   Ht 5' 2 (1.575 m)   Wt 168 lb (76.2 kg)   SpO2 96%   BMI 30.73 kg/m  General: Well developed, NAD, BMI noted Neck: No  thyromegaly  HEENT:  Normocephalic . Face symmetric, atraumatic Lungs:  CTA B Normal respiratory effort, no intercostal retractions, no accessory muscle use. Heart: RRR,  no murmur.  Abdomen:  Not distended, soft, non-tender. No rebound or rigidity.   Lower extremities: +/+++ pretibial and ankle  edema bilaterally  Skin: Exposed areas without rash. Not pale. Not jaundice Neurologic:  alert & oriented X3.  Speech normal, gait appropriate for age and unassisted Strength symmetric and  appropriate for age.  Psych: Cognition and judgment appear intact.  Cooperative with normal attention span and concentration.  Behavior appropriate. No anxious or depressed appearing.     Assessment   Assessment DM: DX 05-2019, A1c 6.6. Neuropathy:  Previous labs WNL, intolerant to gabapentin , Cymbalta .  Lyrica  helps  HTN Hypothyroidism Hyperlipidemia CV: --CAD cath 12/2012 single-vessel CAD,rx CV RF control, Rx Plavix , imdur  --TIA remotely , apparently amaurosis fugax, was a started  on plavix  per PCP, no further sx since. --On ASA and Plavix : Discuss 11/17/2016 --CHF acute on chronic , admitted 10/2023 Osteoporosis -  DEXAs @ [facility], T score -3.9 (12/2015).Prolia  #1:  07/2016 [facility]: Dr Barbarann --DJD- s/p neck and shoulder surgeries ; + DJD cervical spine --knee pain: on tramadol  rx per pcp GI: GED, gastroparesis, diverticulosis, esophageal stricture H/o Iron deficiency anemia H/o granuloma RUL, resected 03-14-13  Assessment & Plan Here for CPX -Tdap: 2011   - PNM 23: 2011, 2022; PNM 13: 2015 ; PNM 20 today - s/p zostavax 02/26/11;  s/p shingrix - Flu  today -Vaccines I recommend: RSV. Tdap.  COVID booster. --No further paps , MMGs, CCS: See previous entries.  -Lifestyle is very good, she follows a low-salt diet, trying to moderate her fluid intake - Labs: BMP AST ALT CBC A1c TSH  Other issues addressed Congestive heart failure   CHF stable, edema controlled, weight 168 today, at goal. - Continue Lasix , Imdur , and potassium. -Continue leg elevation and low-salt intake. CAD: Stable, no recent chest pain.  Last visit with cardiology 03/17/2024, no changes made. On  aspirin  and Plavix . DM: Complicated with CAD, hyperlipidemia. Controlled with diet. Check HbA1c. HTN: Blood looks good, continue present care Hypothyroidism   managed with Synthroid . Check thyroid  labs. High cholesterol: Last LDL satisfactory, continue Zetia , rosuvastatin  40 mg.  Checking LFTs.  Osteoporosis:on  Prolia  injections.  Next injection 08/2024 DJD: On tramadol  as needed, PDMP okay, refill tramadol  prescription.  UDS today. Amoxicillin : On antibiotics for a dental infection.  No fever or chills. RTC 4 months

## 2024-05-21 ENCOUNTER — Encounter: Payer: Self-pay | Admitting: Internal Medicine

## 2024-05-21 NOTE — Assessment & Plan Note (Signed)
 Here for CPX -Tdap: 2011   - PNM 23: 2011, 2022; PNM 13: 2015 ; PNM 20 today - s/p zostavax 02/26/11;  s/p shingrix - Flu  today -Vaccines I recommend: RSV. Tdap.  COVID booster. --No further paps , MMGs, CCS: See previous entries.  -Lifestyle is very good, she follows a low-salt diet, trying to moderate her fluid intake - Labs: BMP AST ALT CBC A1c TSH

## 2024-05-21 NOTE — Assessment & Plan Note (Signed)
 Here for CPX   Other issues addressed Congestive heart failure   CHF stable, edema controlled, weight 168 today, at goal. - Continue Lasix , Imdur , and potassium. -Continue leg elevation and low-salt intake. CAD: Stable, no recent chest pain.  Last visit with cardiology 03/17/2024, no changes made. On  aspirin  and Plavix . DM: Complicated with CAD, hyperlipidemia. Controlled with diet. Check HbA1c. HTN: Blood looks good, continue present care Hypothyroidism   managed with Synthroid . Check thyroid  labs. High cholesterol: Last LDL satisfactory, continue Zetia , rosuvastatin  40 mg.  Checking LFTs.  Osteoporosis:on Prolia  injections.  Next injection 08/2024 DJD: On tramadol  as needed, PDMP okay, refill tramadol  prescription.  UDS today. Amoxicillin : On antibiotics for a dental infection.  No fever or chills. RTC 4 months

## 2024-05-22 LAB — DRUG MONITORING PANEL 375977 , URINE
Alcohol Metabolites: NEGATIVE ng/mL (ref <500)
Alphahydroxyalprazolam: NEGATIVE ng/mL (ref <25)
Alphahydroxymidazolam: NEGATIVE ng/mL (ref <50)
Alphahydroxytriazolam: NEGATIVE ng/mL (ref <50)
Aminoclonazepam: NEGATIVE ng/mL (ref <25)
Amphetamines: NEGATIVE ng/mL (ref <500)
Barbiturates: NEGATIVE ng/mL (ref <300)
Benzodiazepines: POSITIVE ng/mL — AB (ref <100)
Cocaine Metabolite: NEGATIVE ng/mL (ref <150)
Desmethyltramadol: >10000 ng/mL — ABNORMAL HIGH (ref <100)
Hydroxyethylflurazepam: NEGATIVE ng/mL (ref <50)
Lorazepam: NEGATIVE ng/mL (ref <50)
Marijuana Metabolite: NEGATIVE ng/mL (ref <20)
Nordiazepam: NEGATIVE ng/mL (ref <50)
Opiates: NEGATIVE ng/mL (ref <100)
Oxazepam: 77 ng/mL — ABNORMAL HIGH (ref <50)
Oxycodone: NEGATIVE ng/mL (ref <100)
Temazepam: 121 ng/mL — ABNORMAL HIGH (ref <50)
Tramadol: 4871 ng/mL — ABNORMAL HIGH (ref <100)

## 2024-05-22 LAB — CBC WITH DIFFERENTIAL/PLATELET
Absolute Lymphocytes: 2548 {cells}/uL (ref 850–3900)
Absolute Monocytes: 441 {cells}/uL (ref 200–950)
Basophils Absolute: 39 {cells}/uL (ref 0–200)
Basophils Relative: 0.8 %
Eosinophils Absolute: 132 {cells}/uL (ref 15–500)
Eosinophils Relative: 2.7 %
HCT: 41.2 % (ref 35.0–45.0)
Hemoglobin: 14.2 g/dL (ref 11.7–15.5)
MCH: 31.5 pg (ref 27.0–33.0)
MCHC: 34.5 g/dL (ref 32.0–36.0)
MCV: 91.4 fL (ref 80.0–100.0)
MPV: 8.4 fL (ref 7.5–12.5)
Monocytes Relative: 9 %
Neutro Abs: 1740 {cells}/uL (ref 1500–7800)
Neutrophils Relative %: 35.5 %
Platelets: 238 Thousand/uL (ref 140–400)
RBC: 4.51 Million/uL (ref 3.80–5.10)
RDW: 13 % (ref 11.0–15.0)
Total Lymphocyte: 52 %
WBC: 4.9 Thousand/uL (ref 3.8–10.8)

## 2024-05-22 LAB — BASIC METABOLIC PANEL WITH GFR
BUN: 11 mg/dL (ref 7–25)
CO2: 29 mmol/L (ref 20–32)
Calcium: 9.2 mg/dL (ref 8.6–10.4)
Chloride: 102 mmol/L (ref 98–110)
Creat: 0.81 mg/dL (ref 0.60–0.95)
Glucose, Bld: 90 mg/dL (ref 65–99)
Potassium: 3.8 mmol/L (ref 3.5–5.3)
Sodium: 140 mmol/L (ref 135–146)
eGFR: 70 mL/min/1.73m2 (ref >=60)

## 2024-05-22 LAB — TSH: TSH: 3.08 m[IU]/L (ref 0.40–4.50)

## 2024-05-22 LAB — ALT: ALT: 14 U/L (ref 6–29)

## 2024-05-22 LAB — HEMOGLOBIN A1C
Hgb A1c MFr Bld: 6.7 % — ABNORMAL HIGH (ref <5.7)
Mean Plasma Glucose: 146 mg/dL
eAG (mmol/L): 8.1 mmol/L

## 2024-05-22 LAB — AST: AST: 24 U/L (ref 10–35)

## 2024-05-22 LAB — DM TEMPLATE

## 2024-05-23 ENCOUNTER — Ambulatory Visit: Payer: Self-pay | Admitting: Internal Medicine

## 2024-05-23 NOTE — Addendum Note (Signed)
 Addended by: Malaiah Viramontes D on: 05/23/2024 03:40 PM   Modules accepted: Orders

## 2024-05-31 ENCOUNTER — Other Ambulatory Visit: Payer: Self-pay | Admitting: Cardiology

## 2024-06-01 ENCOUNTER — Telehealth: Payer: Self-pay | Admitting: *Deleted

## 2024-06-01 MED ORDER — FUROSEMIDE 40 MG PO TABS: 40.0000 mg | ORAL_TABLET | Freq: Every day | ORAL | 2 refills | Status: AC

## 2024-06-01 NOTE — Telephone Encounter (Signed)
 Rx refill sent to pharmacy.

## 2024-06-23 ENCOUNTER — Ambulatory Visit: Payer: Medicare PPO

## 2024-06-23 VITALS — Ht 62.0 in | Wt 165.0 lb

## 2024-06-23 DIAGNOSIS — Z0000 Encounter for general adult medical examination without abnormal findings: Secondary | ICD-10-CM

## 2024-06-23 NOTE — Progress Notes (Signed)
 Chief Complaint  Patient presents with   Medicare Wellness     Subjective:   Kathleen Thompson is a 89 y.o. female who presents for a Medicare Annual Wellness Visit.  Allergies (verified) Clindamycin/lincomycin, Cymbalta  [duloxetine  hcl], Gabapentin , Hydrocodone, Rofecoxib, and Latex   History: Past Medical History:  Diagnosis Date   Adenomatous colon polyp 02/2003   Arthritis    Blood transfusion    Cataract    Diverticulosis    Esophageal stricture    Gastroparesis    GERD (gastroesophageal reflux disease)    Headache    Hyperlipidemia    Hypertension    Hypothyroidism    Internal hemorrhoid    Iron deficiency anemia    Osteoporosis    PONV (postoperative nausea and vomiting)    PVD (peripheral vascular disease)    Stroke (HCC)    TIA   TIA (transient ischemic attack)    Past Surgical History:  Procedure Laterality Date   ABDOMINAL HYSTERECTOMY     APPENDECTOMY     CERVICAL FUSION     EYE SURGERY Bilateral 08/2016   cataracts   LEFT HEART CATHETERIZATION WITH CORONARY ANGIOGRAM N/A 12/20/2012   Procedure: LEFT HEART CATHETERIZATION WITH CORONARY ANGIOGRAM;  Surgeon: Peter M Jordan, MD;  Location: [facility] CATH LAB;  Service: Cardiovascular;  Laterality: N/A;   shoulder surgery      VIDEO ASSISTED THORACOSCOPY (VATS)/WEDGE RESECTION Right 03/14/2013   Procedure: VIDEO ASSISTED THORACOSCOPY (VATS)/WEDGE RESECTION;  Surgeon: Elspeth JAYSON Millers, MD;  Location: [facility] OR;  Service: Thoracic;  Laterality: Right;   Family History  Problem Relation Age of Onset   Diabetes Mother    Heart attack Mother    Heart attack Father    Asthma Father    Colon cancer Brother        at age 88   Colon cancer Sister        at age 25   Diabetes Maternal Aunt    Diabetes Maternal Uncle    Heart attack Sister    Heart attack Brother    Heart attack Brother    Heart attack Brother    Heart attack Brother    Heart attack Sister    Breast cancer Neg Hx    Social History    Occupational History   Occupation: Retired-[employer]     Employer: RETIRED  Tobacco Use   Smoking status: Never   Smokeless tobacco: Never  Substance and Sexual Activity   Alcohol use: No   Drug use: No   Sexual activity: Yes   Tobacco Counseling Counseling given: No  SDOH Screenings   Food Insecurity: No Food Insecurity (06/23/2024)  Housing: Unknown (06/23/2024)  Transportation Needs: No Transportation Needs (06/23/2024)  Utilities: Not At Risk (06/23/2024)  Alcohol Screen: Low Risk  (06/18/2023)  Depression (PHQ2-9): Low Risk  (06/23/2024)  Financial Resource Strain: Low Risk  (06/18/2023)  Physical Activity: Inactive (06/23/2024)  Social Connections: Socially Integrated (06/23/2024)  Stress: No Stress Concern Present (06/23/2024)  Tobacco Use: Low Risk  (06/23/2024)  Health Literacy: Adequate Health Literacy (06/23/2024)   See flowsheets for full screening details  Depression Screen PHQ 2 & 9 Depression Scale- Over the past 2 weeks, how often have you been bothered by any of the following problems? Little interest or pleasure in doing things: 0 Feeling down, depressed, or hopeless (PHQ Adolescent also includes...irritable): 0 PHQ-2 Total Score: 0 Trouble falling or staying asleep, or sleeping too much: 0 Feeling tired or having little energy: 0 Poor appetite or  overeating (PHQ Adolescent also includes...weight loss): 0 Feeling bad about yourself - or that you are a failure or have let yourself or your family down: 0 Trouble concentrating on things, such as reading the newspaper or watching television (PHQ Adolescent also includes...like school work): 0 Moving or speaking so slowly that other people could have noticed. Or the opposite - being so fidgety or restless that you have been moving around a lot more than usual: 0 Thoughts that you would be better off dead, or of hurting yourself in some way: 0 PHQ-9 Total Score: 0     Goals Addressed               This  Visit's Progress     Remain active (pt-stated)         Visit info / Clinical Intake: Medicare Wellness Visit Type:: Subsequent Annual Wellness Visit Persons participating in visit:: patient Medicare Wellness Visit Mode:: Telephone If telephone:: video declined Because this visit was a virtual/telehealth visit:: pt reported vitals If Telephone or Video please confirm:: I connected with the patient using audio enabled telemedicine application and verified that I am speaking with the correct person using two identifiers Patient Location:: Home Provider Location:: Office Information given by:: patient Interpreter Needed?: No Pre-visit prep was completed: no AWV questionnaire completed by patient prior to visit?: no Living arrangements:: lives with spouse/significant other Patient's Overall Health Status Rating: very good Typical amount of pain: none Does pain affect daily life?: no Are you currently prescribed opioids?: (!) yes  Dietary Habits and Nutritional Risks How many meals a day?: 2 Eats fruit and vegetables daily?: yes Most meals are obtained by: preparing own meals In the last 2 weeks, have you had any of the following?: none Diabetic:: no  Functional Status Activities of Daily Living (to include ambulation/medication): Independent Ambulation: Independent with device- listed below Home Assistive Devices/Equipment: Eyeglasses; Dentures (specify type) Medication Administration: Independent Home Management: Independent Manage your own finances?: yes Primary transportation is: family/friends Concerns about vision?: no *vision screening is required for WTM* Concerns about hearing?: no  Fall Screening Falls in the past year?: 0 Number of falls in past year: 0 Was there an injury with Fall?: 0 Fall Risk Category Calculator: 0 Patient Fall Risk Level: Low Fall Risk  Fall Risk Patient at Risk for Falls Due to: No Fall Risks Fall risk Follow up: Falls evaluation  completed; Education provided  Home and Transportation Safety: All rugs have non-skid backing?: yes All stairs or steps have railings?: N/A, no stairs Grab bars in the bathtub or shower?: yes Have non-skid surface in bathtub or shower?: yes Good home lighting?: yes Regular seat belt use?: yes Hospital stays in the last year:: (!) yes How many hospital stays:: 2 Reason: CHF fluid removed  Cognitive Assessment Difficulty concentrating, remembering, or making decisions? : no Will 6CIT or Mini Cog be Completed: no 6CIT or Mini Cog Declined: patient alert, oriented, able to answer questions appropriately and recall recent events  Advance Directives (For Healthcare) Does Patient Have a Medical Advance Directive?: Yes Does patient want to make changes to medical advance directive?: No - Patient declined Type of Advance Directive: Healthcare Power of [place]; Living will Copy of Healthcare Power of Attorney in Chart?: No - copy requested Copy of Living Will in Chart?: No - copy requested Would patient like information on creating a medical advance directive?: Yes (ED - Information included in AVS)  Reviewed/Updated  Reviewed/Updated: Reviewed All (Medical, Surgical, Family, Medications, Allergies, Care Teams,  Patient Goals)        Objective:    Today's Vitals   06/23/24 1556  Weight: 165 lb (74.8 kg)  Height: 5' 2 (1.575 m)   Body mass index is 30.18 kg/m.  Current Medications (verified) Outpatient Encounter Medications as of 06/23/2024  Medication Sig   amoxicillin  (AMOXIL ) 500 MG capsule Take 500 mg by mouth 3 (three) times daily. (Patient not taking: Reported on 06/23/2024)   aspirin  81 MG tablet Take 81 mg by mouth daily.   CALCIUM  PO Take 1 tablet by mouth 2 (two) times daily.   clopidogrel  (PLAVIX ) 75 MG tablet Take 1 tablet (75 mg total) by mouth daily.   Coenzyme Q10 (CO Q-10 PO) Take 1 tablet by mouth daily after supper.   denosumab  (PROLIA ) 60 MG/ML SOSY  injection Inject 60 mg into the skin every 6 (six) months. Dx code: M81.0.  pt has appt on 02/22/24   ezetimibe  (ZETIA ) 10 MG tablet Take 1 tablet (10 mg total) by mouth daily.   furosemide  (LASIX ) 40 MG tablet Take 1 tablet (40 mg total) by mouth daily.   isosorbide  mononitrate (IMDUR ) 30 MG 24 hr tablet Take 1 tablet (30 mg total) by mouth daily.   levothyroxine  (SYNTHROID ) 50 MCG tablet Take 1 tablet (50 mcg total) by mouth daily before breakfast.   Magnesium  400 MG TABS Take 400 mg by mouth daily.   pantoprazole  (PROTONIX ) 40 MG tablet Take 1 tablet (40 mg total) by mouth daily before breakfast.   potassium chloride  (KLOR-CON ) 10 MEQ tablet Take 1 tablet (10 mEq total) by mouth daily.   pregabalin  (LYRICA ) 25 MG capsule Take 1 capsule by mouth twice daily   rosuvastatin  (CRESTOR ) 40 MG tablet Take 1 tablet (40 mg total) by mouth at bedtime.   traMADol  (ULTRAM ) 50 MG tablet Take 1 tablet (50 mg total) by mouth every 12 (twelve) hours as needed. for pain   vitamin D , CHOLECALCIFEROL , 400 UNITS tablet Take 400 Units by mouth daily.   [DISCONTINUED] clopidogrel  (PLAVIX ) 75 MG tablet Take 1 tablet (75 mg total) by mouth daily.   [DISCONTINUED] ezetimibe  (ZETIA ) 10 MG tablet Take 1 tablet (10 mg total) by mouth daily.   [DISCONTINUED] furosemide  (LASIX ) 40 MG tablet Take 1 tablet (40 mg total) by mouth daily.   [DISCONTINUED] isosorbide  mononitrate (IMDUR ) 30 MG 24 hr tablet Take 1 tablet by mouth once daily   [DISCONTINUED] levothyroxine  (SYNTHROID ) 50 MCG tablet Take 1 tablet (50 mcg total) by mouth daily before breakfast.   [DISCONTINUED] pantoprazole  (PROTONIX ) 40 MG tablet Take 1 tablet (40 mg total) by mouth daily before breakfast.   [DISCONTINUED] pregabalin  (LYRICA ) 25 MG capsule Take 1 capsule (25 mg total) by mouth 2 (two) times daily. (Patient taking differently: Take 50 mg by mouth at bedtime.)   [DISCONTINUED] rosuvastatin  (CRESTOR ) 40 MG tablet Take 1 tablet (40 mg total) by mouth at  bedtime.   [DISCONTINUED] traMADol  (ULTRAM ) 50 MG tablet TAKE 1 TABLET BY MOUTH EVERY 12 HOURS AS NEEDED   Facility-Administered Encounter Medications as of 06/23/2024  Medication   [COMPLETED] denosumab  (PROLIA ) injection 60 mg   [START ON 08/19/2024] denosumab  (PROLIA ) injection 60 mg   Hearing/Vision screen Hearing Screening - Comments:: Denies hearing difficulties   Vision Screening - Comments:: Wears rx glasses - up to date with routine eye exams with  [facility] Immunizations and Health Maintenance Health Maintenance  Topic Date Due   DTaP/Tdap/Td (2 - Tdap) 02/19/2020   FOOT EXAM  12/16/2023  OPHTHALMOLOGY EXAM  01/23/2024   HEMOGLOBIN A1C  11/18/2024   Medicare Annual Wellness (AWV)  06/23/2025   Pneumococcal Vaccine: 50+ Years  Completed   Influenza Vaccine  Completed   DEXA SCAN  Completed   Zoster Vaccines- Shingrix  Completed   Meningococcal B Vaccine  Aged Out   COVID-19 Vaccine  Discontinued        Assessment/Plan:  This is a routine wellness examination for [place].  Patient Care Team: Amon Aloysius BRAVO, MD as PCP - General (Internal Medicine) Dominica Decamp, NP as Nurse Practitioner (Nurse Practitioner) Barbarann Oneil BROCKS, MD (Inactive) as Consulting Physician (Orthopedic Surgery) Ishmael Slough, MD as Consulting Physician (Rheumatology)  I have personally reviewed and noted the following in the patient's chart:   Medical and social history Use of alcohol, tobacco or illicit drugs  Current medications and supplements including opioid prescriptions. Functional ability and status Nutritional status Physical activity Advanced directives List of other physicians Hospitalizations, surgeries, and ER visits in previous 12 months Vitals Screenings to include cognitive, depression, and falls Referrals and appointments  No orders of the defined types were placed in this encounter.  In addition, I have reviewed and discussed with patient certain preventive  protocols, quality metrics, and best practice recommendations. A written personalized care plan for preventive services as well as general preventive health recommendations were provided to patient.   Rojelio LELON Blush, LPN   [id number]/[id number]   Return in 1 year on 06/29/25.  After Visit Summary: (MyChart) Due to this being a telephonic visit, the after visit summary with patients personalized plan was offered to patient via MyChart   Nurse Notes: None

## 2024-06-23 NOTE — Patient Instructions (Addendum)
 Kathleen Thompson,  Thank you for taking the time for your Medicare Wellness Visit. I appreciate your continued commitment to your health goals. Please review the care plan we discussed, and feel free to reach out if I can assist you further.  Please note that Annual Wellness Visits do not include a physical exam. Some assessments may be limited, especially if the visit was conducted virtually. If needed, we may recommend an in-person follow-up with your provider.  Ongoing Care Seeing your primary care provider every 3 to 6 months helps us  monitor your health and provide consistent, personalized care.   Referrals If a referral was made during today's visit and you haven't received any updates within two weeks, please contact the referred provider directly to check on the status.  Recommended Screenings:  Health Maintenance  Topic Date Due   DTaP/Tdap/Td vaccine (2 - Tdap) 02/19/2020   Complete foot exam   12/16/2023   Eye exam for diabetics  01/23/2024   Hemoglobin A1C  11/18/2024   Medicare Annual Wellness Visit  06/23/2025   Pneumococcal Vaccine for age over 35  Completed   Flu Shot  Completed   DEXA scan (bone density measurement)  Completed   Zoster (Shingles) Vaccine  Completed   Meningitis B Vaccine  Aged Out   COVID-19 Vaccine  Discontinued   Opioid Pain Medicine Management Opioids are powerful medicines that are used to treat moderate to severe pain. When used for short periods of time, they can help you to: Sleep better. Do better in physical or occupational therapy. Feel better in the first few days after an injury. Recover from surgery. Opioids should be taken with the supervision of a trained health care provider. They should be taken for the shortest period of time possible. This is because opioids can be addictive, and the longer you take opioids, the greater your risk of addiction. This addiction can also be called opioid use disorder. What are the risks? Using opioid pain  medicines for longer than 3 days increases your risk of side effects. Side effects include: Constipation. Nausea and vomiting. Breathing difficulties (respiratory depression). Drowsiness. Confusion. Opioid use disorder. Itching. Taking opioid pain medicine for a long period of time can affect your ability to do daily tasks. It also puts you at risk for: Motor vehicle crashes. Depression. Suicide. Heart attack. Overdose, which can be life-threatening. What is a pain treatment plan? A pain treatment plan is an agreement between you and your health care provider. Pain is unique to each person, and treatments vary depending on your condition. To manage your pain, you and your health care provider need to work together. To help you do this: Discuss the goals of your treatment, including how much pain you might expect to have and how you will manage the pain. Review the risks and benefits of taking opioid medicines. Remember that a good treatment plan uses more than one approach and minimizes the chance of side effects. Be honest about the amount of medicines you take and about any drug or alcohol use. Get pain medicine prescriptions from only one health care provider. Pain can be managed with many types of alternative treatments. Ask your health care provider to refer you to one or more specialists who can help you manage pain through: Physical or occupational therapy. Counseling (cognitive behavioral therapy). Good nutrition. Biofeedback. Massage. Meditation. Non-opioid medicine. Following a gentle exercise program. How to use opioid pain medicine Taking medicine Take your pain medicine exactly as told by your health  care provider. Take it only when you need it. If your pain gets less severe, you may take less than your prescribed dose if your health care provider approves. If you are not having pain, do nottake pain medicine unless your health care provider tells you to take it. If  your pain is severe, do nottry to treat it yourself by taking more pills than instructed on your prescription. Contact your health care provider for help. Write down the times when you take your pain medicine. It is easy to become confused while on pain medicine. Writing the time can help you avoid overdose. Take other over-the-counter or prescription medicines only as told by your health care provider. Keeping yourself and others safe  While you are taking opioid pain medicine: Do not drive, use machinery, or power tools. Do not sign legal documents. Do not drink alcohol. Do not take sleeping pills. Do not supervise children by yourself. Do not do activities that require climbing or being in high places. Do not go to a lake, river, ocean, spa, or swimming pool. Do not share your pain medicine with anyone. Keep pain medicine in a locked cabinet or in a secure area where pets and children cannot reach it. Stopping your use of opioids If you have been taking opioid medicine for more than a few weeks, you may need to slowly decrease (taper) how much you take until you stop completely. Tapering your use of opioids can decrease your risk of symptoms of withdrawal, such as: Pain and cramping in the abdomen. Nausea. Sweating. Sleepiness. Restlessness. Uncontrollable shaking (tremors). Cravings for the medicine. Do not attempt to taper your use of opioids on your own. Talk with your health care provider about how to do this. Your health care provider may prescribe a step-down schedule based on how much medicine you are taking and how long you have been taking it. Getting rid of leftover pills Do not save any leftover pills. Get rid of leftover pills safely by: Taking the medicine to a prescription take-back program. This is usually offered by the county or law enforcement. Bringing them to a pharmacy that has a drug disposal container. Flushing them down the toilet. Check the label or package  insert of your medicine to see whether this is safe to do. Throwing them out in the trash. Check the label or package insert of your medicine to see whether this is safe to do. If it is safe to throw it out, remove the medicine from the original container, put it into a sealable bag or container, and mix it with used coffee grounds, food scraps, dirt, or cat litter before putting it in the trash. Follow these instructions at home: Activity Do exercises as told by your health care provider. Avoid activities that make your pain worse. Return to your normal activities as told by your health care provider. Ask your health care provider what activities are safe for you. General instructions You may need to take these actions to prevent or treat constipation: Drink enough fluid to keep your urine pale yellow. Take over-the-counter or prescription medicines. Eat foods that are high in fiber, such as beans, whole grains, and fresh fruits and vegetables. Limit foods that are high in fat and processed sugars, such as fried or sweet foods. Keep all follow-up visits. This is important. Where to find support If you have been taking opioids for a long time, you may benefit from receiving support for quitting from a local support group or  counselor. Ask your health care provider for a referral to these resources in your area. Where to find more information Centers for Disease Control and Prevention (CDC): footballexhibition.com.br [place] Food and Drug Administration (FDA): pumpkinsearch.com.ee Get help right away if: You may have taken too much of an opioid (overdosed). Common symptoms of an overdose: Your breathing is slower or more shallow than normal. You have a very slow heartbeat (pulse). You have slurred speech. You have nausea and vomiting. Your pupils become very small. You have other potential symptoms: You are very confused. You faint or feel like you will faint. You have cold, clammy skin. You have blue lips or  fingernails. You have thoughts of harming yourself or harming others. These symptoms may represent a serious problem that is an emergency. Do not wait to see if the symptoms will go away. Get medical help right away. Call your local emergency services (911 in the [place] Do not drive yourself to the hospital.  If you ever feel like you may hurt yourself or others, or have thoughts about taking your own life, get help right away. Go to your nearest emergency department or: Call your local emergency services (911 in the [place] Call the [facility] (901-044-7755 in the [place] Call a suicide crisis helpline, such as the National Suicide Prevention Lifeline at 503-450-9701 or 988 in the [place] This is open 24 hours a day in the [place] If you're a Veteran: Call 988 and press 1. This is open 24 hours a day. Text the [employer] at 609-513-4374. Summary Opioid medicines can help you manage moderate to severe pain for a short period of time. A pain treatment plan is an agreement between you and your health care provider. Discuss the goals of your treatment, including how much pain you might expect to have and how you will manage the pain. If you think that you or someone else may have taken too much of an opioid, get medical help right away. This information is not intended to replace advice given to you by your health care provider. Make sure you discuss any questions you have with your health care provider. Document Revised: 05/04/2023 Document Reviewed: 11/07/2020 Elsevier Patient Education  2024 [employer] Inc.    06/23/2024    3:58 PM  Advanced Directives  Does Patient Have a Medical Advance Directive? Yes  Type of Estate Agent of [place];Living will  Does patient want to make changes to medical advance directive? No - Patient declined  Copy of Healthcare Power of Attorney in Chart? No - copy requested    Vision: Annual vision screenings are  recommended for early detection of glaucoma, cataracts, and diabetic retinopathy. These exams can also reveal signs of chronic conditions such as diabetes and high blood pressure.  Dental: Annual dental screenings help detect early signs of oral cancer, gum disease, and other conditions linked to overall health, including heart disease and diabetes.  Please see the attached documents for additional preventive care recommendations.

## 2024-06-26 ENCOUNTER — Other Ambulatory Visit: Payer: Self-pay | Admitting: Internal Medicine

## 2024-07-14 ENCOUNTER — Other Ambulatory Visit: Payer: Self-pay | Admitting: Internal Medicine

## 2024-07-14 NOTE — Telephone Encounter (Signed)
 Requesting: Lyrica  25mg  Contract:05/20/24 UDS: 05/20/24 Last Visit: 05/20/24 Next Visit: 09/21/24 Last Refill: 02/09/24 #60 and 4RF   Please Advise

## 2024-07-18 ENCOUNTER — Other Ambulatory Visit: Payer: Self-pay | Admitting: Internal Medicine

## 2024-08-03 ENCOUNTER — Telehealth: Payer: Self-pay | Admitting: Family

## 2024-08-03 NOTE — Telephone Encounter (Signed)
 PDMP okay, Rx sent

## 2024-08-03 NOTE — Telephone Encounter (Signed)
 Requesting: tramadol  50mg   Contract: 05/20/24 UDS: 05/20/24 Last Visit: 05/20/24 Next Visit: 09/21/24 Last Refill: 05/20/24 #60 and 0RF   Please Advise

## 2024-08-08 ENCOUNTER — Telehealth: Payer: Self-pay | Admitting: Family

## 2024-08-08 NOTE — Telephone Encounter (Signed)
 Requesting: Lyrica  25mg  Contract:05/20/24 UDS:05/20/24 Last Visit: 05/20/24 Next Visit:09/21/24 Last Refill: 07/14/24 #60 and 0RF   Please Advise

## 2024-08-08 NOTE — Telephone Encounter (Signed)
 PDMP okay, Rx sent

## 2024-08-16 ENCOUNTER — Other Ambulatory Visit (HOSPITAL_COMMUNITY): Payer: Self-pay

## 2024-08-16 ENCOUNTER — Telehealth: Payer: Self-pay

## 2024-08-16 NOTE — Telephone Encounter (Signed)
 Prolia  VOB initiated via [email or web address]  Next Prolia  inj DUE: 08/24/24

## 2024-08-23 ENCOUNTER — Other Ambulatory Visit (HOSPITAL_COMMUNITY): Payer: Self-pay

## 2024-08-23 NOTE — Telephone Encounter (Signed)
 Kathleen Thompson

## 2024-08-23 NOTE — Telephone Encounter (Signed)
 Pt ready for scheduling for PROLIA  on or after : 08/24/24  Option# 1: Buy/Bill (Office supplied medication)  Out-of-pocket cost due at time of clinic visit: $392  Number of injection/visits approved: 2  Primary: [employer] Prolia  co-insurance: 20% (Following Medicare guideline) Admin fee co-insurance: $40  Secondary: --- Prolia  co-insurance:  Admin fee co-insurance:   Medical Benefit Details: Date Benefits were checked: 08/23/24 Deductible: NO/ Coinsurance: 20%/ Admin Fee: $40  Prior Auth: APPROVED PA# [id number] Expiration Date: 08/11/24-08/10/25   # of doses approved: 2 ----------------------------------------------------------------------- Option# 2- Med Obtained from pharmacy:  Pharmacy benefit: Copay $64 (Paid to pharmacy) Admin Fee: $40 (Pay at clinic)  Prior Auth: N/A PA# Expiration Date:   # of doses approved:   If patient wants fill through the pharmacy benefit please send prescription to: [facility], and include estimated need by date in rx notes. Pharmacy will ship medication directly to the office.  Patient NOT eligible for Prolia  Copay Card. Copay Card can make patient's cost as little as $25. Link to apply: [email or web address]://[email or web address]  ** This summary of benefits is an estimation of the patient's out-of-pocket cost. Exact cost may very based on individual plan coverage.

## 2024-08-23 NOTE — Telephone Encounter (Signed)
 SABRA

## 2024-08-24 ENCOUNTER — Other Ambulatory Visit: Payer: Self-pay | Admitting: *Deleted

## 2024-08-24 MED ORDER — DENOSUMAB 60 MG/ML ~~LOC~~ SOSY
60.0000 mg | PREFILLED_SYRINGE | SUBCUTANEOUS | 0 refills | Status: AC
  Filled 2024-08-29: qty 1, 180d supply, fill #0

## 2024-08-29 ENCOUNTER — Other Ambulatory Visit: Payer: Self-pay

## 2024-08-29 ENCOUNTER — Other Ambulatory Visit (HOSPITAL_COMMUNITY): Payer: Self-pay

## 2024-08-29 NOTE — Progress Notes (Signed)
 Specialty Pharmacy Initial Fill Coordination Note  Kathleen Thompson is a 89 y.o. female contacted today regarding initial fill of specialty medication(s) Denosumab  (PROLIA )   Patient requested Courier to Provider Office   Delivery date: 09/01/24   Verified address: [place]  [facility]  [address]   Medication will be filled on: 08/31/24   Patient is aware of $64 copayment.

## 2024-08-29 NOTE — Progress Notes (Signed)
 Benefits investigation complete. See encounter from 08/16/24. Copay is $64

## 2024-08-31 ENCOUNTER — Other Ambulatory Visit: Payer: Self-pay

## 2024-09-06 ENCOUNTER — Ambulatory Visit

## 2024-09-07 ENCOUNTER — Other Ambulatory Visit: Payer: Self-pay

## 2024-09-16 ENCOUNTER — Other Ambulatory Visit: Payer: Self-pay | Admitting: Internal Medicine

## 2024-09-21 ENCOUNTER — Ambulatory Visit: Admitting: Internal Medicine

## 2024-09-21 ENCOUNTER — Ambulatory Visit

## 2024-10-04 ENCOUNTER — Ambulatory Visit: Admitting: Internal Medicine

## 2024-10-06 ENCOUNTER — Ambulatory Visit: Admitting: Cardiology

## 2025-06-29 ENCOUNTER — Ambulatory Visit
